# Patient Record
Sex: Female | Born: 1935 | Race: White | Hispanic: No | Smoking: Former smoker
Health system: Southern US, Community
[De-identification: ages and names within clinical notes are randomized; demographics above are authoritative.]

## PROBLEM LIST (undated history)

## (undated) DIAGNOSIS — E119 Type 2 diabetes mellitus without complications: Secondary | ICD-10-CM

## (undated) DIAGNOSIS — D472 Monoclonal gammopathy: Secondary | ICD-10-CM

## (undated) DIAGNOSIS — E559 Vitamin D deficiency, unspecified: Secondary | ICD-10-CM

## (undated) DIAGNOSIS — M549 Dorsalgia, unspecified: Secondary | ICD-10-CM

## (undated) DIAGNOSIS — I10 Essential (primary) hypertension: Secondary | ICD-10-CM

## (undated) DIAGNOSIS — C50919 Malignant neoplasm of unspecified site of unspecified female breast: Secondary | ICD-10-CM

## (undated) DIAGNOSIS — D729 Disorder of white blood cells, unspecified: Secondary | ICD-10-CM

## (undated) DIAGNOSIS — E785 Hyperlipidemia, unspecified: Secondary | ICD-10-CM

## (undated) DIAGNOSIS — M810 Age-related osteoporosis without current pathological fracture: Secondary | ICD-10-CM

## (undated) HISTORY — DX: Essential (primary) hypertension: I10

## (undated) HISTORY — PX: MASTECTOMY: SHX3

## (undated) HISTORY — DX: Malignant neoplasm of unspecified site of unspecified female breast: C50.919

## (undated) HISTORY — PX: BREAST RECONSTRUCTION: SHX9

## (undated) HISTORY — DX: Type 2 diabetes mellitus without complications: E11.9

## (undated) HISTORY — DX: Vitamin D deficiency, unspecified: E55.9

## (undated) HISTORY — PX: TOOTH EXTRACTION: SUR596

## (undated) HISTORY — DX: Age-related osteoporosis without current pathological fracture: M81.0

## (undated) HISTORY — PX: OTHER SURGICAL HISTORY: SHX169

## (undated) HISTORY — DX: Monoclonal gammopathy: D47.2

## (undated) HISTORY — DX: Hyperlipidemia, unspecified: E78.5

## (undated) HISTORY — PX: WISDOM TOOTH EXTRACTION: SHX21

## (undated) HISTORY — PX: BREAST BIOPSY: SHX20

## (undated) HISTORY — DX: Disorder of white blood cells, unspecified: D72.9

---

## 1979-07-21 DIAGNOSIS — C50919 Malignant neoplasm of unspecified site of unspecified female breast: Secondary | ICD-10-CM

## 1979-07-21 HISTORY — DX: Malignant neoplasm of unspecified site of unspecified female breast: C50.919

## 1998-06-26 ENCOUNTER — Other Ambulatory Visit: Admission: RE | Admit: 1998-06-26 | Discharge: 1998-06-26 | Payer: Self-pay | Admitting: Cardiology

## 2000-04-22 ENCOUNTER — Other Ambulatory Visit: Admission: RE | Admit: 2000-04-22 | Discharge: 2000-04-22 | Payer: Self-pay | Admitting: General Surgery

## 2000-04-22 ENCOUNTER — Encounter: Admission: RE | Admit: 2000-04-22 | Discharge: 2000-04-22 | Payer: Self-pay | Admitting: General Surgery

## 2000-04-22 ENCOUNTER — Encounter: Payer: Self-pay | Admitting: General Surgery

## 2000-04-22 ENCOUNTER — Encounter (INDEPENDENT_AMBULATORY_CARE_PROVIDER_SITE_OTHER): Payer: Self-pay | Admitting: *Deleted

## 2002-08-29 ENCOUNTER — Ambulatory Visit (HOSPITAL_COMMUNITY): Admission: RE | Admit: 2002-08-29 | Discharge: 2002-08-29 | Payer: Self-pay | Admitting: Gastroenterology

## 2002-08-29 ENCOUNTER — Encounter (INDEPENDENT_AMBULATORY_CARE_PROVIDER_SITE_OTHER): Payer: Self-pay | Admitting: Specialist

## 2004-06-20 ENCOUNTER — Ambulatory Visit: Payer: Self-pay | Admitting: Hematology & Oncology

## 2004-12-18 ENCOUNTER — Ambulatory Visit: Payer: Self-pay | Admitting: Hematology & Oncology

## 2007-12-15 ENCOUNTER — Ambulatory Visit: Payer: Self-pay | Admitting: Hematology & Oncology

## 2007-12-19 LAB — CBC WITH DIFFERENTIAL/PLATELET
Basophils Absolute: 0 10*3/uL (ref 0.0–0.1)
EOS%: 3.1 % (ref 0.0–7.0)
Eosinophils Absolute: 0.1 10*3/uL (ref 0.0–0.5)
LYMPH%: 11.2 % — ABNORMAL LOW (ref 14.0–48.0)
MCH: 32.4 pg (ref 26.0–34.0)
MCV: 94.9 fL (ref 81.0–101.0)
MONO%: 10.5 % (ref 0.0–13.0)
NEUT#: 1.7 10*3/uL (ref 1.5–6.5)
Platelets: 110 10*3/uL — ABNORMAL LOW (ref 145–400)
RBC: 4.32 10*6/uL (ref 3.70–5.32)

## 2008-06-01 ENCOUNTER — Ambulatory Visit: Payer: Self-pay | Admitting: Hematology & Oncology

## 2008-06-04 LAB — CBC WITH DIFFERENTIAL (CANCER CENTER ONLY)
BASO#: 0 10*3/uL (ref 0.0–0.2)
EOS%: 4.3 % (ref 0.0–7.0)
HCT: 38.3 % (ref 34.8–46.6)
HGB: 13 g/dL (ref 11.6–15.9)
LYMPH#: 0.3 10*3/uL — ABNORMAL LOW (ref 0.9–3.3)
MCHC: 34 g/dL (ref 32.0–36.0)
MONO#: 0.2 10*3/uL (ref 0.1–0.9)
NEUT%: 70.4 % (ref 39.6–80.0)

## 2008-09-21 ENCOUNTER — Ambulatory Visit: Payer: Self-pay | Admitting: Hematology & Oncology

## 2008-09-24 LAB — CBC WITH DIFFERENTIAL (CANCER CENTER ONLY)
BASO%: 0.2 % (ref 0.0–2.0)
HCT: 38.8 % (ref 34.8–46.6)
HGB: 12.9 g/dL (ref 11.6–15.9)
LYMPH#: 0.3 10*3/uL — ABNORMAL LOW (ref 0.9–3.3)
MONO#: 0.2 10*3/uL (ref 0.1–0.9)
NEUT%: 69.6 % (ref 39.6–80.0)
RDW: 11 % (ref 10.5–14.6)
WBC: 1.7 10*3/uL — ABNORMAL LOW (ref 3.9–10.0)

## 2008-09-24 LAB — CHCC SATELLITE - SMEAR

## 2008-11-19 ENCOUNTER — Ambulatory Visit: Payer: Self-pay | Admitting: Hematology & Oncology

## 2009-02-20 ENCOUNTER — Ambulatory Visit: Payer: Self-pay | Admitting: Hematology & Oncology

## 2009-02-21 LAB — CBC WITH DIFFERENTIAL (CANCER CENTER ONLY)
BASO%: 0.4 % (ref 0.0–2.0)
LYMPH#: 0.3 10*3/uL — ABNORMAL LOW (ref 0.9–3.3)
MONO#: 0.2 10*3/uL (ref 0.1–0.9)
NEUT#: 1.8 10*3/uL (ref 1.5–6.5)
Platelets: 97 10*3/uL — ABNORMAL LOW (ref 145–400)
RDW: 11.1 % (ref 10.5–14.6)
WBC: 2.4 10*3/uL — ABNORMAL LOW (ref 3.9–10.0)

## 2009-02-21 LAB — RETICULOCYTES (CHCC)
ABS Retic: 69.9 10*3/uL (ref 19.0–186.0)
RBC.: 2.69 MIL/uL — ABNORMAL LOW (ref 3.87–5.11)
Retic Ct Pct: 2.6 % (ref 0.4–3.1)

## 2009-05-22 ENCOUNTER — Ambulatory Visit: Payer: Self-pay | Admitting: Hematology & Oncology

## 2009-05-23 LAB — CBC WITH DIFFERENTIAL (CANCER CENTER ONLY)
BASO%: 0.4 % (ref 0.0–2.0)
LYMPH%: 13.8 % — ABNORMAL LOW (ref 14.0–48.0)
MCH: 32.7 pg (ref 26.0–34.0)
MCV: 94 fL (ref 81–101)
MONO%: 9.8 % (ref 0.0–13.0)
Platelets: 93 10*3/uL — ABNORMAL LOW (ref 145–400)
RDW: 11.5 % (ref 10.5–14.6)
WBC: 1.7 10*3/uL — ABNORMAL LOW (ref 3.9–10.0)

## 2009-05-23 LAB — RETICULOCYTES (CHCC): RBC.: 3.22 MIL/uL — ABNORMAL LOW (ref 3.87–5.11)

## 2009-08-28 ENCOUNTER — Ambulatory Visit: Payer: Self-pay | Admitting: Hematology & Oncology

## 2009-08-29 LAB — CBC WITH DIFFERENTIAL (CANCER CENTER ONLY)
BASO#: 0 10*3/uL (ref 0.0–0.2)
BASO%: 0.2 % (ref 0.0–2.0)
EOS%: 3.3 % (ref 0.0–7.0)
Eosinophils Absolute: 0.1 10*3/uL (ref 0.0–0.5)
HCT: 38 % (ref 34.8–46.6)
HGB: 13.1 g/dL (ref 11.6–15.9)
LYMPH#: 0.3 10*3/uL — ABNORMAL LOW (ref 0.9–3.3)
LYMPH%: 12.2 % — ABNORMAL LOW (ref 14.0–48.0)
MCH: 31.8 pg (ref 26.0–34.0)
MCHC: 34.4 g/dL (ref 32.0–36.0)
MCV: 93 fL (ref 81–101)
MONO#: 0.2 10*3/uL (ref 0.1–0.9)
MONO%: 8.9 % (ref 0.0–13.0)
NEUT#: 1.5 10*3/uL (ref 1.5–6.5)
NEUT%: 75.4 % (ref 39.6–80.0)
Platelets: 116 10*3/uL — ABNORMAL LOW (ref 145–400)
RBC: 4.1 10*6/uL (ref 3.70–5.32)
RDW: 11.7 % (ref 10.5–14.6)
WBC: 2 10*3/uL — ABNORMAL LOW (ref 3.9–10.0)

## 2009-08-29 LAB — CHCC SATELLITE - SMEAR

## 2009-08-30 LAB — VITAMIN B12: Vitamin B-12: 373 pg/mL (ref 211–911)

## 2009-08-30 LAB — FERRITIN: Ferritin: 58 ng/mL (ref 10–291)

## 2009-08-30 LAB — ERYTHROPOIETIN: Erythropoietin: 18.8 m[IU]/mL (ref 2.6–34.0)

## 2009-10-25 ENCOUNTER — Encounter: Payer: Self-pay | Admitting: Cardiology

## 2010-01-22 ENCOUNTER — Ambulatory Visit: Payer: Self-pay | Admitting: Hematology & Oncology

## 2010-01-23 LAB — CBC WITH DIFFERENTIAL (CANCER CENTER ONLY)
BASO#: 0 10*3/uL (ref 0.0–0.2)
BASO%: 0.2 % (ref 0.0–2.0)
EOS%: 4.8 % (ref 0.0–7.0)
Eosinophils Absolute: 0.1 10*3/uL (ref 0.0–0.5)
HCT: 39 % (ref 34.8–46.6)
HGB: 13.1 g/dL (ref 11.6–15.9)
LYMPH#: 0.3 10*3/uL — ABNORMAL LOW (ref 0.9–3.3)
LYMPH%: 13.6 % — ABNORMAL LOW (ref 14.0–48.0)
MCH: 32.5 pg (ref 26.0–34.0)
MCHC: 33.5 g/dL (ref 32.0–36.0)
MCV: 97 fL (ref 81–101)
MONO#: 0.2 10*3/uL (ref 0.1–0.9)
MONO%: 7.7 % (ref 0.0–13.0)
NEUT#: 1.8 10*3/uL (ref 1.5–6.5)
NEUT%: 73.7 % (ref 39.6–80.0)
Platelets: 121 10*3/uL — ABNORMAL LOW (ref 145–400)
RBC: 4.01 10*6/uL (ref 3.70–5.32)
RDW: 11.7 % (ref 10.5–14.6)
WBC: 2.5 10*3/uL — ABNORMAL LOW (ref 3.9–10.0)

## 2010-01-23 LAB — RETICULOCYTES (CHCC)
ABS Retic: 70.7 10*3/uL (ref 19.0–186.0)
RBC.: 3.93 MIL/uL (ref 3.87–5.11)
Retic Ct Pct: 1.8 % (ref 0.4–3.1)

## 2010-01-23 LAB — CHCC SATELLITE - SMEAR

## 2010-07-01 ENCOUNTER — Ambulatory Visit: Payer: Self-pay | Admitting: Hematology & Oncology

## 2010-07-03 LAB — CBC WITH DIFFERENTIAL (CANCER CENTER ONLY)
EOS%: 1.5 % (ref 0.0–7.0)
Eosinophils Absolute: 0 10*3/uL (ref 0.0–0.5)
MCH: 31.7 pg (ref 26.0–34.0)
MCHC: 33.4 g/dL (ref 32.0–36.0)
MONO%: 14.2 % — ABNORMAL HIGH (ref 0.0–13.0)
NEUT#: 1 10*3/uL — ABNORMAL LOW (ref 1.5–6.5)
Platelets: 92 10*3/uL — ABNORMAL LOW (ref 145–400)
RBC: 3.68 10*6/uL — ABNORMAL LOW (ref 3.70–5.32)

## 2010-07-03 LAB — CHCC SATELLITE - SMEAR

## 2010-09-12 ENCOUNTER — Inpatient Hospital Stay (HOSPITAL_COMMUNITY)
Admission: EM | Admit: 2010-09-12 | Discharge: 2010-09-13 | DRG: 087 | Disposition: A | Discharge status: Home / Self Care | Payer: Medicare Other | Source: Other Acute Inpatient Hospital | Attending: Neurological Surgery | Admitting: Neurological Surgery

## 2010-09-12 ENCOUNTER — Emergency Department (HOSPITAL_BASED_OUTPATIENT_CLINIC_OR_DEPARTMENT_OTHER)
Admission: EM | Admit: 2010-09-12 | Discharge: 2010-09-12 | Disposition: A | Discharge status: Other Acute Inpatient Hospital | Payer: Medicare Other | Source: Home / Self Care

## 2010-09-12 ENCOUNTER — Emergency Department (INDEPENDENT_AMBULATORY_CARE_PROVIDER_SITE_OTHER): Payer: Medicare Other

## 2010-09-12 DIAGNOSIS — F29 Unspecified psychosis not due to a substance or known physiological condition: Secondary | ICD-10-CM | POA: Insufficient documentation

## 2010-09-12 DIAGNOSIS — Z853 Personal history of malignant neoplasm of breast: Secondary | ICD-10-CM | POA: Insufficient documentation

## 2010-09-12 DIAGNOSIS — S0100XA Unspecified open wound of scalp, initial encounter: Secondary | ICD-10-CM | POA: Insufficient documentation

## 2010-09-12 DIAGNOSIS — S066XAA Traumatic subarachnoid hemorrhage with loss of consciousness status unknown, initial encounter: Principal | ICD-10-CM | POA: Diagnosis present

## 2010-09-12 DIAGNOSIS — S0003XA Contusion of scalp, initial encounter: Secondary | ICD-10-CM

## 2010-09-12 DIAGNOSIS — F329 Major depressive disorder, single episode, unspecified: Secondary | ICD-10-CM | POA: Insufficient documentation

## 2010-09-12 DIAGNOSIS — Z043 Encounter for examination and observation following other accident: Secondary | ICD-10-CM

## 2010-09-12 DIAGNOSIS — W19XXXA Unspecified fall, initial encounter: Secondary | ICD-10-CM

## 2010-09-12 DIAGNOSIS — M542 Cervicalgia: Secondary | ICD-10-CM | POA: Insufficient documentation

## 2010-09-12 DIAGNOSIS — I609 Nontraumatic subarachnoid hemorrhage, unspecified: Secondary | ICD-10-CM

## 2010-09-12 DIAGNOSIS — Y92009 Unspecified place in unspecified non-institutional (private) residence as the place of occurrence of the external cause: Secondary | ICD-10-CM | POA: Insufficient documentation

## 2010-09-12 DIAGNOSIS — R4701 Aphasia: Secondary | ICD-10-CM

## 2010-09-12 DIAGNOSIS — S066X9A Traumatic subarachnoid hemorrhage with loss of consciousness of unspecified duration, initial encounter: Principal | ICD-10-CM | POA: Diagnosis present

## 2010-09-12 DIAGNOSIS — S1093XA Contusion of unspecified part of neck, initial encounter: Secondary | ICD-10-CM

## 2010-09-12 DIAGNOSIS — W010XXA Fall on same level from slipping, tripping and stumbling without subsequent striking against object, initial encounter: Secondary | ICD-10-CM | POA: Insufficient documentation

## 2010-09-12 DIAGNOSIS — R4182 Altered mental status, unspecified: Secondary | ICD-10-CM | POA: Insufficient documentation

## 2010-09-12 DIAGNOSIS — I10 Essential (primary) hypertension: Secondary | ICD-10-CM | POA: Insufficient documentation

## 2010-09-12 DIAGNOSIS — F3289 Other specified depressive episodes: Secondary | ICD-10-CM | POA: Insufficient documentation

## 2010-09-12 DIAGNOSIS — S066X0A Traumatic subarachnoid hemorrhage without loss of consciousness, initial encounter: Secondary | ICD-10-CM | POA: Insufficient documentation

## 2010-09-12 DIAGNOSIS — Z79899 Other long term (current) drug therapy: Secondary | ICD-10-CM | POA: Insufficient documentation

## 2010-09-12 LAB — BASIC METABOLIC PANEL
BUN: 12 mg/dL (ref 6–23)
Chloride: 99 mEq/L (ref 96–112)
Creatinine, Ser: 0.9 mg/dL (ref 0.4–1.2)
GFR calc Af Amer: >60 mL/min (ref >60)
GFR calc non Af Amer: >60 mL/min (ref >60)
Potassium: 4 mEq/L (ref 3.5–5.1)

## 2010-09-12 LAB — CBC
MCH: 31.4 pg (ref 26.0–34.0)
MCV: 92 fL (ref 78.0–100.0)
Platelets: 103 10*3/uL — ABNORMAL LOW (ref 150–400)
RBC: 3.73 MIL/uL — ABNORMAL LOW (ref 3.87–5.11)
RDW: 13.3 % (ref 11.5–15.5)

## 2010-09-12 LAB — ETHANOL: Alcohol, Ethyl (B): <10 mg/dL (ref 0–10)

## 2010-09-12 LAB — DIFFERENTIAL
Basophils Relative: 0 % (ref 0–1)
Eosinophils Absolute: 0.1 10*3/uL (ref 0.0–0.7)
Eosinophils Relative: 1 % (ref 0–5)
Lymphs Abs: 0.1 10*3/uL — ABNORMAL LOW (ref 0.7–4.0)
Monocytes Relative: 6 % (ref 3–12)
Neutrophils Relative %: 91 % — ABNORMAL HIGH (ref 43–77)

## 2010-09-12 LAB — PROTIME-INR
INR: 1.06 (ref 0.00–1.49)
Prothrombin Time: 14 seconds (ref 11.6–15.2)

## 2010-09-13 NOTE — H&P (Signed)
  NAME:  [name] [name], PUA NO.:  [id number]  MEDICAL RECORD NO.:  [id number]           PATIENT TYPE:  I  LOCATION:  3015                         FACILITY:  [facility]  PHYSICIAN:  Tia Alert, MD     DATE OF BIRTH:  10/27/1934  DATE OF ADMISSION:  09/12/2010 DATE OF DISCHARGE:                             HISTORY & PHYSICAL   ADMITTING DIAGNOSIS:  Closed head injury.  HISTORY OF PRESENT ILLNESS:  [name] [name] is a 76 year old female who states she fell earlier this evening.  She has amnesia for the event. She denies any significant headache, visual changes, numbness, tingling or weakness.  She does have a significant scalp laceration, which they put some attempted staples and at the other hospital and then wrapped her head and send her here.  Head CT showed some very small amount of traumatic subarachnoid hemorrhage in the high left parietal region and she was sent for neurosurgical consultation.  PAST MEDICAL HISTORY: 1. Breast cancer. 2. Depression. 3. Hypertension.  MEDICATIONS: 1. Crestor. 2. Propranolol. 3. Evista. 4. Spironolactone. 5. Lexapro.  ALLERGIES:  NO KNOWN DRUG ALLERGIES.  SOCIAL HISTORY:  Nondrinker, nonsmoker.  PHYSICAL EXAM:  VITAL SIGNS:  Temperature 98.4, blood pressure 144/69, pulse 75, respirations 16. GENERAL:  Pleasant cooperative female in no acute distress. HEENT: She has a significant 4-cm scalp laceration on the left, which goes to the calvarium with significant hematoma.  This is going to have to be repaired in the operating room.  Extraocular movements intact. Pupils are equal and reactive. NECK:  Supple and nontender. HEART:  Regular rhythm. EXTREMITIES:  No obvious deformities. NEUROLOGIC:  She is awake and alert.  She is interactive.  She has no aphasia.  She has good attention span.  She is conversive.  No facial asymmetry.  Tongue protrudes in midline.  She has very good strength throughout with good muscle tone and  good muscle bulk.  Gait is not tested.  IMAGING STUDIES:  CT scan of the head shows small amount of traumatic subarachnoid hemorrhage in the high left parietal region.  No shift, mass effect, or edema.  ASSESSMENT AND PLAN:  She has a small amount of traumatic subarachnoid hemorrhage and a closed head injury with amnesia for the event.  She has a traumatic scalp laceration, which is going to be have to be repaired in the operating room under sedation.  There seems to be significant loss of tissue, especially the underlying fascia, I will try to bring this together and close, it is best I can in the operating room.  All of this has been explained to her.  I think she will heal very nicely from her head injury.  The scalp laceration hopefully will heal nicely, but I am worried about the skin edges, likely the scalp is very vascular and tends to heal fairly well.     Tia Alert, MD     DSJ/MEDQ  D:  09/12/2010  T:  09/13/2010  Job:  [id number]  Electronically Signed by Marikay Alar MD on 09/13/2010 08:11:47 AM

## 2010-09-13 NOTE — Op Note (Signed)
  NAME:  [name] [name], [name] [name] NO.:  [id number]  MEDICAL RECORD NO.:  [id number]           PATIENT TYPE:  I  LOCATION:  3015                         FACILITY:  [facility]  PHYSICIAN:  Tia Alert, MD     DATE OF BIRTH:  1935/07/06  DATE OF PROCEDURE:  09/12/2010 DATE OF DISCHARGE:                              OPERATIVE REPORT   PREPROCEDURE DIAGNOSIS:  Complex left parietal scalp laceration.  PROCEDURE:  Repair of complex left parietal scalp laceration measuring 6 cm in length.  DESCRIPTION OF PROCEDURE:  The patient was taken to the operating room and given sedation.  Her left parietal region was shaved around the laceration, prepped with DuraPrep, and then draped in usual sterile fashion.  I cleaned out the wound, removed the hematoma, dried any bleeding I could find, and then placed several 2-0 Vicryls in lacerated galea.  Tried to bring the edges as close together as I could and then closed the skin with a running 3-0 Ethilon suture.  Sterile dressing was then applied, and the patient was taken back to her room in stable condition.  At the end of the procedure, all sponge, needle, and instrument counts were correct.     Tia Alert, MD     DSJ/MEDQ  D:  09/12/2010  T:  09/13/2010  Job:  [id number]  Electronically Signed by Marikay Alar MD on 09/13/2010 08:11:44 AM

## 2010-10-10 NOTE — Discharge Summary (Signed)
  NAME:  CALAIS, SVEHLA NO.:  [id number]  MEDICAL RECORD NO.:  [id number]           PATIENT TYPE:  LOCATION:                                 FACILITY:  PHYSICIAN:  Tia Alert, MD     DATE OF BIRTH:  1934-12-29  DATE OF ADMISSION: DATE OF DISCHARGE:                              DISCHARGE SUMMARY   ADDENDUM  This is an addendum to a handwritten discharge note.  The patient's final diagnosis should be closed head injury with scalp laceration.     Tia Alert, MD     DSJ/MEDQ  D:  09/26/2010  T:  09/26/2010  Job:  [id number]  Electronically Signed by Marikay Alar MD on 10/10/2010 11:56:35 AM

## 2010-10-16 ENCOUNTER — Other Ambulatory Visit: Payer: Self-pay | Admitting: Hematology & Oncology

## 2010-10-16 ENCOUNTER — Encounter (HOSPITAL_BASED_OUTPATIENT_CLINIC_OR_DEPARTMENT_OTHER): Payer: Medicare Other | Admitting: Hematology & Oncology

## 2010-10-16 DIAGNOSIS — D72819 Decreased white blood cell count, unspecified: Secondary | ICD-10-CM

## 2010-10-16 DIAGNOSIS — D696 Thrombocytopenia, unspecified: Secondary | ICD-10-CM

## 2010-10-16 LAB — RETICULOCYTES (CHCC)
ABS Retic: 62.2 10*3/uL (ref 19.0–186.0)
RBC.: 3.89 MIL/uL (ref 3.87–5.11)
Retic Ct Pct: 1.6 % (ref 0.4–3.1)

## 2010-10-16 LAB — CBC WITH DIFFERENTIAL (CANCER CENTER ONLY)
BASO#: 0 10*3/uL (ref 0.0–0.2)
BASO%: 1.6 % (ref 0.0–2.0)
EOS%: 3.1 % (ref 0.0–7.0)
LYMPH#: 0.2 10*3/uL — ABNORMAL LOW (ref 0.9–3.3)
MCH: 30.5 pg (ref 26.0–34.0)
MCHC: 32.9 g/dL (ref 32.0–36.0)
MONO%: 14.6 % — ABNORMAL HIGH (ref 0.0–13.0)
NEUT#: 1.3 10*3/uL — ABNORMAL LOW (ref 1.5–6.5)
Platelets: 94 10*3/uL — ABNORMAL LOW (ref 145–400)
RDW: 13.2 % (ref 11.1–15.7)

## 2010-10-16 LAB — CHCC SATELLITE - SMEAR

## 2010-12-05 NOTE — Op Note (Signed)
   NAME:  [name] [name], [name] [name]                             ACCOUNT NO.:  [id number]   MEDICAL RECORD NO.:  [id number]                   PATIENT TYPE:  AMB   LOCATION:  ENDO                                 FACILITY:  [facility]   PHYSICIAN:  Petra Kuba, M.D.                 DATE OF BIRTH:  06/15/1935   DATE OF PROCEDURE:  08/15/2002  DATE OF DISCHARGE:                                 OPERATIVE REPORT   PROCEDURE:  Colonoscopy with polypectomy.   INDICATIONS FOR PROCEDURE:  Screening.   Consent was signed after risks, benefits, methods, and options were  thoroughly discussed in the office.   MEDICINES USED:  Demerol 80, Versed 7.   DESCRIPTION OF PROCEDURE:  Rectal inspection was pertinent for external  hemorrhoids, small. Digital exam was negative. The pediatric video  adjustable colonoscope was inserted, easily advanced around the colon to the  cecum. This did require some abdominal pressure but no position changes. No  obvious abnormality was seen on insertion. The cecum was identified by the  appendiceal orifice and the ileocecal valve.  The prep was adequate. There  was some liquid stool that required washing and suctioning. On slow  withdrawal through the colon, other than a tiny proximal descending polyp  which was hot biopsied x1, no other abnormalities were seen as we slowly  withdrew back to the rectum. Once back in the rectum, the scope was  retroflexed pertinent for some internal hemorrhoids. The scope was  straightened, air was suctioned, scope removed. The patient tolerated the  procedure well. There was no obvious or immediate complications.   ENDOSCOPIC DIAGNOSIS:  1. Internal and external hemorrhoids.  2. Proximal descending polyp hot biopsied.  3. Otherwise within normal limits to the cecum.   PLAN:  Await pathology but probably recheck in five years. Happy to see back  p.r.n., otherwise, return care to Dr. Waynard Edwards for the customary health care  maintenance to include  yearly rectals and guaiacs.                                               Petra Kuba, M.D.    MEM/MEDQ  D:  08/29/2002  T:  08/29/2002  Job:  [id number]   cc:   Loraine Leriche A. Waynard Edwards, M.D.  [address]  [place]  [place] [address]  Fax: [phone]

## 2011-04-01 ENCOUNTER — Encounter (HOSPITAL_BASED_OUTPATIENT_CLINIC_OR_DEPARTMENT_OTHER): Payer: Medicare Other | Admitting: Hematology & Oncology

## 2011-04-01 ENCOUNTER — Other Ambulatory Visit: Payer: Self-pay | Admitting: Hematology & Oncology

## 2011-04-01 DIAGNOSIS — Z23 Encounter for immunization: Secondary | ICD-10-CM

## 2011-04-01 DIAGNOSIS — D696 Thrombocytopenia, unspecified: Secondary | ICD-10-CM

## 2011-04-01 DIAGNOSIS — D72819 Decreased white blood cell count, unspecified: Secondary | ICD-10-CM

## 2011-04-01 LAB — CBC WITH DIFFERENTIAL (CANCER CENTER ONLY)
BASO#: 0 10*3/uL (ref 0.0–0.2)
EOS%: 2.5 % (ref 0.0–7.0)
HCT: 36.5 % (ref 34.8–46.6)
HGB: 12.4 g/dL (ref 11.6–15.9)
LYMPH%: 5.8 % — ABNORMAL LOW (ref 14.0–48.0)
MCH: 32.4 pg (ref 26.0–34.0)
MCHC: 34 g/dL (ref 32.0–36.0)
MCV: 95 fL (ref 81–101)
MONO%: 14.1 % — ABNORMAL HIGH (ref 0.0–13.0)
NEUT%: 77.2 % (ref 39.6–80.0)

## 2011-09-30 ENCOUNTER — Ambulatory Visit (HOSPITAL_BASED_OUTPATIENT_CLINIC_OR_DEPARTMENT_OTHER): Payer: Medicare Other | Admitting: Hematology & Oncology

## 2011-09-30 ENCOUNTER — Other Ambulatory Visit (HOSPITAL_BASED_OUTPATIENT_CLINIC_OR_DEPARTMENT_OTHER): Payer: Medicare Other | Admitting: Lab

## 2011-09-30 VITALS — BP 134/69 | HR 62 | Temp 97.5°F | Wt 170.0 lb

## 2011-09-30 DIAGNOSIS — D696 Thrombocytopenia, unspecified: Secondary | ICD-10-CM

## 2011-09-30 DIAGNOSIS — D72819 Decreased white blood cell count, unspecified: Secondary | ICD-10-CM

## 2011-09-30 LAB — CBC WITH DIFFERENTIAL (CANCER CENTER ONLY)
BASO#: 0 10*3/uL (ref 0.0–0.2)
Eosinophils Absolute: 0.2 10*3/uL (ref 0.0–0.5)
HGB: 12.6 g/dL (ref 11.6–15.9)
LYMPH%: 7.4 % — ABNORMAL LOW (ref 14.0–48.0)
MCH: 32.1 pg (ref 26.0–34.0)
MCV: 96 fL (ref 81–101)
MONO#: 0.4 10*3/uL (ref 0.1–0.9)
MONO%: 13.1 % — ABNORMAL HIGH (ref 0.0–13.0)
RBC: 3.93 10*6/uL (ref 3.70–5.32)

## 2011-09-30 NOTE — Progress Notes (Signed)
Diagnosis: Chronic leukopenia and thrombocytopenia.  Current therapy: Observation  Interim history: [name] [name] comes in for followup. We last saw back in September of 2012. Since then, she done well. She's had chronic leukopenia and thrombocytopenia. I will follow her now for several years. She's been totally asymptomatic.  Extent of changes in her medications. She's had no infections. There's been no rashes. Genitalia bowel or bladder habits.  Her mammogram was and is due this month.  On physical exam this is a well-developed well-nourished white female in no obvious distress. Vital signs 97 5 pulse 62 heart rate 18 blood pressure 134/69. Weight is 170 pounds.  Her head and neck exam shows no ocular or oral lesions. There is no adenopathy in her neck. Thyroid is nonpalpable. Lungs are clear bilaterally. Cardiac exam regular rhythm with no murmurs rubs or bruits. The bowel sounds soft with good bowel sounds. There is no palpable liver or spleen tip. His back exam no tenderness over the spine ribs or hips. Extremities shows no clubbing cyanosis or edema. Skin exam no rashes ecchymoses or petechia.  Laboratory studies WBC count is 2.8 hemoglobin 12.6 hematocrit 37.8 platelet count 99.  Peripheral smear is unremarkable. She has good maturation of her  red cells and white cells she has no immature myeloid cells. I see no hypersegmented polys. There is no nucleated red blood cells. Platelets are decreased in number. She has a few large platelets. Platelets are well granulated.   Impression: [name] [name] is a 77 year old white female with chronic leukopenia and no cytopenia. I haven't seen her since 2005. Her platelet count has trended down slightly but she has a symptomatic.  I do not see need for intervention. I really think we can see her back yearly now. She doesn't give [place] a call if there are any issues.

## 2011-12-19 ENCOUNTER — Encounter: Payer: Self-pay | Admitting: *Deleted

## 2011-12-19 ENCOUNTER — Other Ambulatory Visit: Payer: Self-pay | Admitting: *Deleted

## 2012-02-11 ENCOUNTER — Other Ambulatory Visit: Payer: Self-pay

## 2012-02-19 ENCOUNTER — Ambulatory Visit (HOSPITAL_BASED_OUTPATIENT_CLINIC_OR_DEPARTMENT_OTHER): Payer: Medicare Other | Admitting: Hematology & Oncology

## 2012-02-19 ENCOUNTER — Other Ambulatory Visit (HOSPITAL_BASED_OUTPATIENT_CLINIC_OR_DEPARTMENT_OTHER): Payer: Medicare Other | Admitting: Lab

## 2012-02-19 VITALS — BP 134/61 | HR 58 | Temp 99.0°F | Resp 18 | Ht 68.5 in | Wt 167.0 lb

## 2012-02-19 DIAGNOSIS — D708 Other neutropenia: Secondary | ICD-10-CM

## 2012-02-19 DIAGNOSIS — D72819 Decreased white blood cell count, unspecified: Secondary | ICD-10-CM

## 2012-02-19 DIAGNOSIS — D696 Thrombocytopenia, unspecified: Secondary | ICD-10-CM

## 2012-02-19 DIAGNOSIS — D472 Monoclonal gammopathy: Secondary | ICD-10-CM

## 2012-02-19 LAB — CBC WITH DIFFERENTIAL (CANCER CENTER ONLY)
BASO%: 0.4 % (ref 0.0–2.0)
EOS%: 2.2 % (ref 0.0–7.0)
HCT: 36.4 % (ref 34.8–46.6)
LYMPH%: 9 % — ABNORMAL LOW (ref 14.0–48.0)
MCHC: 33.5 g/dL (ref 32.0–36.0)
MCV: 97 fL (ref 81–101)
MONO#: 0.4 10*3/uL (ref 0.1–0.9)
NEUT%: 72.7 % (ref 39.6–80.0)
RDW: 13.6 % (ref 11.1–15.7)

## 2012-02-19 NOTE — Progress Notes (Signed)
This office note has been dictated.
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
## 2012-06-23 ENCOUNTER — Ambulatory Visit (HOSPITAL_BASED_OUTPATIENT_CLINIC_OR_DEPARTMENT_OTHER)
Admission: RE | Admit: 2012-06-23 | Discharge: 2012-06-23 | Disposition: A | Discharge status: Home / Self Care | Payer: Medicare Other | Source: Ambulatory Visit | Attending: Hematology & Oncology | Admitting: Hematology & Oncology

## 2012-06-23 ENCOUNTER — Ambulatory Visit (HOSPITAL_BASED_OUTPATIENT_CLINIC_OR_DEPARTMENT_OTHER): Payer: Medicare Other | Admitting: Hematology & Oncology

## 2012-06-23 ENCOUNTER — Other Ambulatory Visit (HOSPITAL_BASED_OUTPATIENT_CLINIC_OR_DEPARTMENT_OTHER): Payer: Medicare Other | Admitting: Lab

## 2012-06-23 VITALS — BP 123/50 | HR 61 | Temp 98.0°F | Resp 16 | Ht 68.0 in | Wt 172.0 lb

## 2012-06-23 DIAGNOSIS — D61818 Other pancytopenia: Secondary | ICD-10-CM

## 2012-06-23 DIAGNOSIS — D72819 Decreased white blood cell count, unspecified: Secondary | ICD-10-CM | POA: Insufficient documentation

## 2012-06-23 DIAGNOSIS — D472 Monoclonal gammopathy: Secondary | ICD-10-CM

## 2012-06-23 DIAGNOSIS — Z9010 Acquired absence of unspecified breast and nipple: Secondary | ICD-10-CM | POA: Insufficient documentation

## 2012-06-23 DIAGNOSIS — R05 Cough: Secondary | ICD-10-CM

## 2012-06-23 DIAGNOSIS — I10 Essential (primary) hypertension: Secondary | ICD-10-CM | POA: Insufficient documentation

## 2012-06-23 DIAGNOSIS — R059 Cough, unspecified: Secondary | ICD-10-CM | POA: Insufficient documentation

## 2012-06-23 DIAGNOSIS — I2789 Other specified pulmonary heart diseases: Secondary | ICD-10-CM | POA: Insufficient documentation

## 2012-06-23 LAB — CBC WITH DIFFERENTIAL (CANCER CENTER ONLY)
BASO#: 0 10*3/uL (ref 0.0–0.2)
EOS%: 5.1 % (ref 0.0–7.0)
Eosinophils Absolute: 0.1 10*3/uL (ref 0.0–0.5)
HCT: 36.1 % (ref 34.8–46.6)
HGB: 11.7 g/dL (ref 11.6–15.9)
LYMPH#: 0.2 10*3/uL — ABNORMAL LOW (ref 0.9–3.3)
MCHC: 32.4 g/dL (ref 32.0–36.0)
NEUT%: 73.9 % (ref 39.6–80.0)
RBC: 3.63 10*6/uL — ABNORMAL LOW (ref 3.70–5.32)

## 2012-06-23 NOTE — Progress Notes (Signed)
This office note has been dictated.

## 2012-06-24 NOTE — Progress Notes (Signed)
CC:   Mark A. Perini, M.D.  DIAGNOSIS: 1. Leukopenia/thrombocytopenia, progressive. 2. IgM kappa monoclonal gammopathy of undetermined significance     (MGUS).  CURRENT THERAPY:  Observation.  INTERIM HISTORY:  Ms. Windholz comes in for followup.  She is doing fairly well.  She has had no complaints since we saw her back in August.  She has had no fevers, sweats, or chills.  She has had no rashes.  There has been no nausea or vomiting.  She has had a dry cough.  I probably need to do a chest x-ray on her.  I think her last chest x-ray that I have in the system was almost 2 years ago.  She has had no change in her weight.  There have been no palpable lymph glands.  PHYSICAL EXAMINATION:  General:  This is a well-developed, well- nourished white female, in no obvious distress.  Vital signs: Temperature 98.6, pulse 61, respiratory rate 16, blood pressure 123/50. Weight is 172.  Head and neck:  Normocephalic, atraumatic skull.  There are no ocular or oral lesions.  There are no palpable cervical or supraclavicular lymph nodes.  Lungs:  Clear bilaterally.  Cardiac: Regular rate and rhythm, with a normal S1, S2.  There are no murmurs, rubs or bruits.  Abdomen:  Soft, with good bowel sounds.  There is no fluid wave.  There is no palpable abdominal mass.  No palpable hepatosplenomegaly.  Extremities:  Show no clubbing, cyanosis or edema. Skin:  No rashes, ecchymosis or petechia.  LABORATORY STUDIES:  White cell count is 1.8, hemoglobin 11.7, hematocrit 36.1, platelet count 72,000.  MCV is 99.  Peripheral smear shows a normochromic, normocytic population of red blood cells.  There are no nucleated red blood cells.  There may be some spherocytes.  I see no schistocytes.  There is no obvious rouleaux formation.  White cells appear decreased in number.  She has good maturation of her neutrophils.  There may be a couple of atypical lymphocytes.  No blasts are noted.  Platelets are decreased  in number. She has a few large platelets.  IMPRESSION:  Ms. Hannis is a 77 year old white female with progressive pancytopenia.  I suspect that there is something going on with her bone marrow now.  I think we are going to have to do a bone marrow test on her.  I talked to her about this.  I explained to her why I think we need to do one.  We will get one set up for 07/05/2012.  We will go ahead and plan to get her back in about 3 weeks.  By then we will have the results from the bone marrow test, including cytogenetics and flow cytometry, which will be critical.  This IgM kappa MGUS might be a clue as to what is going on.    ______________________________ Josph Macho, M.D. PRE/MEDQ  D:  06/23/2012  T:  06/24/2012  Job:  [id number]

## 2012-06-27 LAB — PROTEIN ELECTROPHORESIS, SERUM, WITH REFLEX
Alpha-1-Globulin: 4.9 % (ref 2.9–4.9)
Beta 2: 3.2 % (ref 3.2–6.5)
Gamma Globulin: 17.6 % (ref 11.1–18.8)
M-Spike, %: 0.63 g/dL

## 2012-06-27 LAB — KAPPA/LAMBDA LIGHT CHAINS
Kappa:Lambda Ratio: 37.34 — ABNORMAL HIGH (ref 0.26–1.65)
Lambda Free Lght Chn: 1.43 mg/dL (ref 0.57–2.63)

## 2012-06-27 LAB — IGG, IGA, IGM
IgA: 103 mg/dL (ref 69–380)
IgG (Immunoglobin G), Serum: 574 mg/dL — ABNORMAL LOW (ref 690–1700)
IgM, Serum: 893 mg/dL — ABNORMAL HIGH (ref 52–322)

## 2012-06-27 LAB — LACTATE DEHYDROGENASE: LDH: 140 U/L (ref 94–250)

## 2012-06-30 ENCOUNTER — Encounter (HOSPITAL_COMMUNITY): Payer: Self-pay | Admitting: Pharmacy Technician

## 2012-07-04 ENCOUNTER — Other Ambulatory Visit: Payer: Self-pay | Admitting: *Deleted

## 2012-07-04 DIAGNOSIS — D61818 Other pancytopenia: Secondary | ICD-10-CM

## 2012-07-05 ENCOUNTER — Ambulatory Visit (HOSPITAL_COMMUNITY)
Admission: RE | Admit: 2012-07-05 | Discharge: 2012-07-05 | Disposition: A | Discharge status: Home / Self Care | Payer: Medicare Other | Source: Ambulatory Visit | Attending: Hematology & Oncology | Admitting: Hematology & Oncology

## 2012-07-05 ENCOUNTER — Ambulatory Visit (HOSPITAL_BASED_OUTPATIENT_CLINIC_OR_DEPARTMENT_OTHER): Payer: Medicare Other | Admitting: Hematology & Oncology

## 2012-07-05 ENCOUNTER — Encounter (HOSPITAL_COMMUNITY): Payer: Self-pay

## 2012-07-05 VITALS — BP 101/77 | HR 58 | Temp 98.1°F | Resp 16

## 2012-07-05 DIAGNOSIS — D61818 Other pancytopenia: Secondary | ICD-10-CM

## 2012-07-05 DIAGNOSIS — D696 Thrombocytopenia, unspecified: Secondary | ICD-10-CM | POA: Insufficient documentation

## 2012-07-05 DIAGNOSIS — D472 Monoclonal gammopathy: Secondary | ICD-10-CM | POA: Insufficient documentation

## 2012-07-05 DIAGNOSIS — D72819 Decreased white blood cell count, unspecified: Secondary | ICD-10-CM | POA: Insufficient documentation

## 2012-07-05 LAB — CBC WITH DIFFERENTIAL/PLATELET
Basophils Relative: 1 % (ref 0–1)
Eosinophils Absolute: 0.2 10*3/uL (ref 0.0–0.7)
Eosinophils Relative: 9 % — ABNORMAL HIGH (ref 0–5)
MCH: 32.5 pg (ref 26.0–34.0)
MCHC: 34.1 g/dL (ref 30.0–36.0)
Neutrophils Relative %: 68 % (ref 43–77)
Platelets: 100 10*3/uL — ABNORMAL LOW (ref 150–400)

## 2012-07-05 MED ORDER — MIDAZOLAM HCL 2 MG/2ML IJ SOLN
INTRAMUSCULAR | Status: AC | PRN
  Administered 2012-07-05 (×2): 1 mg via INTRAVENOUS

## 2012-07-05 MED ORDER — MIDAZOLAM HCL 10 MG/2ML IJ SOLN
INTRAMUSCULAR | Status: AC
  Filled 2012-07-05: qty 2

## 2012-07-05 MED ORDER — MEPERIDINE HCL 50 MG/ML IJ SOLN
INTRAMUSCULAR | Status: AC
Start: 2012-07-05 — End: 2012-07-05
  Filled 2012-07-05: qty 1

## 2012-07-05 MED ORDER — MEPERIDINE HCL 25 MG/ML IJ SOLN
INTRAMUSCULAR | Status: AC | PRN
  Administered 2012-07-05 (×2): 12.5 mg via INTRAVENOUS

## 2012-07-05 MED ORDER — SODIUM CHLORIDE 0.9 % IV SOLN
Freq: Once | INTRAVENOUS | Status: AC
  Administered 2012-07-05: 08:00:00 via INTRAVENOUS

## 2012-07-05 NOTE — ED Notes (Signed)
No bleeding at site

## 2012-07-05 NOTE — ED Notes (Signed)
Pt warm and dry color pink ,just drowsy and easily awakens when spoken to. Dressing CDI

## 2012-07-05 NOTE — ED Notes (Signed)
Ambulated in room and tolerated this well. Dressing CDI

## 2012-07-05 NOTE — Sedation Documentation (Signed)
Medication dose calculated and verified WUJ:[id number] 25 mg IV,Versed 2 mg IV

## 2012-07-05 NOTE — ED Notes (Signed)
Family updated as to patient's status.

## 2012-07-05 NOTE — ED Notes (Signed)
Patient is resting comfortably. 

## 2012-07-05 NOTE — ED Notes (Signed)
Patient denies pain and is resting comfortably.  

## 2012-07-05 NOTE — ED Notes (Signed)
Procedure ends and dressing to posterior iliac crest area with hypafix and gauze.pt placed supine with towel to site for pressure

## 2012-07-08 ENCOUNTER — Telehealth: Payer: Self-pay | Admitting: Hematology & Oncology

## 2012-07-08 NOTE — Telephone Encounter (Signed)
Per MD request pt aware moved 12-31 to 08-16-12

## 2012-07-19 ENCOUNTER — Other Ambulatory Visit: Payer: Medicare Other | Admitting: Lab

## 2012-07-19 ENCOUNTER — Ambulatory Visit: Payer: Medicare Other | Admitting: Hematology & Oncology

## 2012-07-22 ENCOUNTER — Encounter: Payer: Self-pay | Admitting: Hematology & Oncology

## 2012-08-16 ENCOUNTER — Ambulatory Visit (HOSPITAL_BASED_OUTPATIENT_CLINIC_OR_DEPARTMENT_OTHER): Payer: Medicare Other

## 2012-08-16 ENCOUNTER — Other Ambulatory Visit (HOSPITAL_BASED_OUTPATIENT_CLINIC_OR_DEPARTMENT_OTHER): Payer: Medicare Other | Admitting: Lab

## 2012-08-16 ENCOUNTER — Ambulatory Visit (HOSPITAL_BASED_OUTPATIENT_CLINIC_OR_DEPARTMENT_OTHER): Payer: Medicare Other | Admitting: Hematology & Oncology

## 2012-08-16 VITALS — BP 170/65 | HR 61 | Temp 97.6°F | Resp 16 | Ht 68.0 in | Wt 168.0 lb

## 2012-08-16 DIAGNOSIS — D708 Other neutropenia: Secondary | ICD-10-CM

## 2012-08-16 DIAGNOSIS — C50919 Malignant neoplasm of unspecified site of unspecified female breast: Secondary | ICD-10-CM

## 2012-08-16 DIAGNOSIS — D72819 Decreased white blood cell count, unspecified: Secondary | ICD-10-CM

## 2012-08-16 DIAGNOSIS — D696 Thrombocytopenia, unspecified: Secondary | ICD-10-CM

## 2012-08-16 DIAGNOSIS — D472 Monoclonal gammopathy: Secondary | ICD-10-CM

## 2012-08-16 DIAGNOSIS — Z23 Encounter for immunization: Secondary | ICD-10-CM

## 2012-08-16 LAB — CBC WITH DIFFERENTIAL (CANCER CENTER ONLY)
BASO%: 0.5 % (ref 0.0–2.0)
Eosinophils Absolute: 0.2 10*3/uL (ref 0.0–0.5)
MCH: 32.1 pg (ref 26.0–34.0)
MONO#: 0.3 10*3/uL (ref 0.1–0.9)
MONO%: 13.9 % — ABNORMAL HIGH (ref 0.0–13.0)
NEUT#: 1.4 10*3/uL — ABNORMAL LOW (ref 1.5–6.5)
Platelets: 92 10*3/uL — ABNORMAL LOW (ref 145–400)
RBC: 3.86 10*6/uL (ref 3.70–5.32)
RDW: 13.6 % (ref 11.1–15.7)
WBC: 2 10*3/uL — ABNORMAL LOW (ref 3.9–10.0)

## 2012-08-16 LAB — CHCC SATELLITE - SMEAR

## 2012-08-16 MED ORDER — INFLUENZA VIRUS VACC SPLIT PF IM SUSP
0.5000 mL | Freq: Once | INTRAMUSCULAR | Status: AC
  Administered 2012-08-16: 0.5 mL via INTRAMUSCULAR
  Filled 2012-08-16: qty 0.5

## 2012-08-16 NOTE — Progress Notes (Signed)
CC:   Mark A. Perini, M.D.  DIAGNOSES: 1. Leukopenia/thrombocytopenia, stable. 2. IgM kappa MGUS (monoclonal gammopathy of undetermined     significance).  CURRENT THERAPY:  Observation.  INTERIM HISTORY:  Ms. Bazar comes in for followup.  We did do a bone marrow biopsy on her.  This was done back on December 17.  I did the bone marrow because I thought her blood counts were getting worse.  She also had the monoclonal spike that we were following.  I was worried about her having the possibility of an underlying lymphoma.  The bone marrow report ([id number]) showed a hypercellular marrow with only 30% plasma cells.  Everything really looked good with the bone marrow.  We did do cytogenetics on the bone marrow.  The cytogenetics showed a normal bone marrow.  She had FISH studies done.  There was nothing on the FISH studies that looked unusual.  We are following her lab work every few months now.  She feels well.  She had a good Christmas.  Of note, when we last saw her, her monoclonal spike was 0.63 mg/dL.  Her IgM was 893.  Kappa light chain was 53.4 mg/dL.  She has had no headache.  There is no double vision or blurred vision. She has had no cough.  There has been no change in bowel or bladder habits.  She has a little bit of a rash on the right side of her abdomen.  She thinks this may have been from when she was out in the yard moving branches and tree limbs.  PHYSICAL EXAM:  General:  This is a well-developed, well-nourished white female in no obvious distress.  Vital signs:  Show temperature of 97.6, pulse 61, respiratory rate 16, blood pressure 170/65.  Weight is 168. Head and neck:  Shows a normocephalic, atraumatic skull.  There are no ocular or oral lesions.  There are no palpable cervical or supraclavicular lymph nodes.  Lungs are clear bilaterally.  Cardiac: Regular rate and rhythm with a normal S1, S2.  There are no murmurs, rubs or bruits.  Abdomen:  Soft with good  bowel sounds.  There is no palpable abdominal mass.  There is no palpable hepatosplenomegaly. Extremities:  Show no clubbing, cyanosis or edema.  Skin:  No rashes, ecchymoses or petechiae.  LABORATORY STUDIES:  White cell count is 2.0, hemoglobin 12.4, hematocrit 38, platelet count 92,000.  IMPRESSION:  Ms. Diclemente is a 78 year old white female with chronic leukopenia and thrombocytopenia.  Again, we did the bone marrow biopsy on her.  One could suspect myelodysplasia but again the bone marrow was normal.  She had normal cytogenetics.  Whether or not the monoclonal gammopathy is related is unclear but I see nothing that looks significant or that we need to treat.  I think we can probably get her back in 3 months' time now for followup.    ______________________________ Josph Macho, M.D. PRE/MEDQ  D:  08/16/2012  T:  08/16/2012  Job:  [id number]

## 2012-08-16 NOTE — Patient Instructions (Signed)
Influenza Virus Vaccine injection (Fluarix)  What is this medicine?Influenza Virus Vaccine injection (Fluarix) What is this medicine? INFLUENZA VIRUS VACCINE (in floo EN zuh VAHY ruhs vak SEEN) helps to reduce the risk of getting influenza also known as the flu. This medicine may be used for other purposes; ask your health care provider or pharmacist if you have questions. What should I tell my health care provider before I take this medicine? They need to know if you have any of these conditions: -bleeding disorder like hemophilia -fever or infection -Guillain-Barre syndrome or other neurological problems -immune system problems -infection with the human immunodeficiency virus (HIV) or AIDS -low blood platelet counts -multiple sclerosis -an unusual or allergic reaction to influenza virus vaccine, eggs, chicken proteins, latex, gentamicin, other medicines, foods, dyes or preservatives -pregnant or trying to get pregnant -breast-feeding How should I use this medicine? This vaccine is for injection into a muscle. It is given by a health care professional. A copy of Vaccine Information Statements will be given before each vaccination. Read this sheet carefully each time. The sheet may change frequently. Talk to your pediatrician regarding the use of this medicine in children. Special care may be needed. Overdosage: If you think you have taken too much of this medicine contact a poison control center or emergency room at once. NOTE: This medicine is only for you. Do not share this medicine with others. What if I miss a dose? This does not apply. What may interact with this medicine? -chemotherapy or radiation therapy -medicines that lower your immune system like etanercept, anakinra, infliximab, and adalimumab -medicines that treat or prevent blood clots like warfarin -phenytoin -steroid medicines like prednisone or cortisone -theophylline -vaccines This list may not describe all  possible interactions. Give your health care provider a list of all the medicines, herbs, non-prescription drugs, or dietary supplements you use. Also tell them if you smoke, drink alcohol, or use illegal drugs. Some items may interact with your medicine. What should I watch for while using this medicine? Report any side effects that do not go away within 3 days to your doctor or health care professional. Call your health care provider if any unusual symptoms occur within 6 weeks of receiving this vaccine. You may still catch the flu, but the illness is not usually as bad. You cannot get the flu from the vaccine. The vaccine will not protect against colds or other illnesses that may cause fever. The vaccine is needed every year. What side effects may I notice from receiving this medicine? Side effects that you should report to your doctor or health care professional as soon as possible: -allergic reactions like skin rash, itching or hives, swelling of the face, lips, or tongue Side effects that usually do not require medical attention (report to your doctor or health care professional if they continue or are bothersome): -fever -headache -muscle aches and pains -pain, tenderness, redness, or swelling at site where injected -weak or tired This list may not describe all possible side effects. Call your doctor for medical advice about side effects. You may report side effects to FDA at 1-800-FDA-1088. Where should I keep my medicine? This vaccine is only given in a clinic, pharmacy, doctor's office, or other health care setting and will not be stored at home. NOTE: This sheet is a summary. It may not cover all possible information. If you have questions about this medicine, talk to your doctor, pharmacist, or health care provider.  2012, Elsevier/Gold Standard. (02/01/2008 9:30:40 AM)  INFLUENZA VIRUS VACCINE (in floo EN zuh VAHY ruhs vak SEEN) helps to reduce the risk of getting influenza also known  as the flu. This medicine may be used for other purposes; ask your health care provider or pharmacist if you have questions. What should I tell my health care provider before I take this medicine? They need to know if you have any of these conditions: -bleeding disorder like hemophilia -fever or infection -Guillain-Barre syndrome or other neurological problems -immune system problems -infection with the human immunodeficiency virus (HIV) or AIDS -low blood platelet counts -multiple sclerosis -an unusual or allergic reaction to influenza virus vaccine, eggs, chicken proteins, latex, gentamicin, other medicines, foods, dyes or preservatives -pregnant or trying to get pregnant -breast-feeding How should I use this medicine? This vaccine is for injection into a muscle. It is given by a health care professional. A copy of Vaccine Information Statements will be given before each vaccination. Read this sheet carefully each time. The sheet may change frequently. Talk to your pediatrician regarding the use of this medicine in children. Special care may be needed. Overdosage: If you think you have taken too much of this medicine contact a poison control center or emergency room at once. NOTE: This medicine is only for you. Do not share this medicine with others. What if I miss a dose? This does not apply. What may interact with this medicine? -chemotherapy or radiation therapy -medicines that lower your immune system like etanercept, anakinra, infliximab, and adalimumab -medicines that treat or prevent blood clots like warfarin -phenytoin -steroid medicines like prednisone or cortisone -theophylline -vaccines This list may not describe all possible interactions. Give your health care provider a list of all the medicines, herbs, non-prescription drugs, or dietary supplements you use. Also tell them if you smoke, drink alcohol, or use illegal drugs. Some items may interact with your medicine. What  should I watch for while using this medicine? Report any side effects that do not go away within 3 days to your doctor or health care professional. Call your health care provider if any unusual symptoms occur within 6 weeks of receiving this vaccine. You may still catch the flu, but the illness is not usually as bad. You cannot get the flu from the vaccine. The vaccine will not protect against colds or other illnesses that may cause fever. The vaccine is needed every year. What side effects may I notice from receiving this medicine? Side effects that you should report to your doctor or health care professional as soon as possible: -allergic reactions like skin rash, itching or hives, swelling of the face, lips, or tongue Side effects that usually do not require medical attention (report to your doctor or health care professional if they continue or are bothersome): -fever -headache -muscle aches and pains -pain, tenderness, redness, or swelling at site where injected -weak or tired This list may not describe all possible side effects. Call your doctor for medical advice about side effects. You may report side effects to FDA at 1-800-FDA-1088. Where should I keep my medicine? This vaccine is only given in a clinic, pharmacy, doctor's office, or other health care setting and will not be stored at home. NOTE: This sheet is a summary. It may not cover all possible information. If you have questions about this medicine, talk to your doctor, pharmacist, or health care provider.  2012, Elsevier/Gold Standard. (02/01/2008 9:30:40 AM)

## 2012-08-16 NOTE — Progress Notes (Signed)
This office note has been dictated.

## 2012-08-16 NOTE — Procedures (Signed)
[name] was brought to the short stay unit at [facility]. She was here for a bone marrow biopsy.  She had an IV placed without difficulty.  We did the appropriate time- out procedure on her.  Her Mallampati score was 1.  ASA class was 1.  She was then placed onto her right side.  She received a total of 2 mg of Versed and 25 mg of Demerol for IV sedation.  We then prepped and draped the left posterior iliac crest in a sterile fashion.  We infiltrated 8 cc of 2% lidocaine under the skin down to the periosteum.  A #11 scalpel was used to make an incision into the skin. We obtained 2 bone marrow aspirates without difficulty.  We then obtained a bone marrow biopsy core without difficulty.  We dressed the incision site.  The patient tolerated the procedure well.  There were no complications.    ______________________________ Josph Macho, M.D. PRE/MEDQ  D:  08/16/2012  T:  08/16/2012  Job:  [id number]

## 2012-08-18 LAB — IGG, IGA, IGM
IgA: 118 mg/dL (ref 69–380)
IgG (Immunoglobin G), Serum: 600 mg/dL — ABNORMAL LOW (ref 690–1700)

## 2012-08-18 LAB — PROTEIN ELECTROPHORESIS, SERUM, WITH REFLEX
Alpha-2-Globulin: 9.7 % (ref 7.1–11.8)
Gamma Globulin: 18.4 % (ref 11.1–18.8)
M-Spike, %: 0.86 g/dL
Total Protein, Serum Electrophoresis: 6.6 g/dL (ref 6.0–8.3)

## 2012-08-19 NOTE — Procedures (Signed)
[name] [name] was brought to the short-stay unit at [facility]. She had an IV placed without difficulty.  Her Mallampati score was 1.  Her ASA class was 1.  She had the appropriate time-out procedure done.  She was onto her right side.  She received Versed 2.5 mg and Demerol 25 mg for IV sedation.  The left posterior iliac crest region was prepped and draped in a sterile fashion.  2% lidocaine was infiltrated under the skin down to the periosteum.  We used 8 cc.  A #11 scalpel was used to make an incision into the skin.  We then obtained 2 bone marrow aspirates.  These were done without difficulty.  One was sent for flow cytometry and cytogenetics.  We then used a Jamshidi biopsy needle to obtain a bone marrow biopsy core.  This was done without complications.  We dressed the procedure site sterilely.  We applied a dressing.  [name] [name] tolerated the procedure well.  There were no complications.    ______________________________ Josph Macho, M.D. PRE/MEDQ  D:  08/12/2012  T:  08/13/2012  Job:  [id number]

## 2012-08-29 ENCOUNTER — Telehealth: Payer: Self-pay | Admitting: Hematology & Oncology

## 2012-08-29 NOTE — Telephone Encounter (Addendum)
Message copied by Cathi Roan on Mon Aug 29, 2012  4:09 PM ------      Message from: [place], [place] N      Created: Mon Jun 27, 2012  1:02 PM                   ----- Message -----         From: Josph Macho, MD         Sent: 06/24/2012   7:42 AM           To: Onc Nurse Hp            Call and tell that her chest x-ray does not show any pneumonia or fluid. Thanks. Pete ------08-29-12  Called patient and she was aware of results.  Lupita Raider LPN

## 2012-09-29 ENCOUNTER — Other Ambulatory Visit: Payer: Medicare Other | Admitting: Lab

## 2012-09-29 ENCOUNTER — Ambulatory Visit: Payer: Medicare Other | Admitting: Hematology & Oncology

## 2012-11-10 ENCOUNTER — Ambulatory Visit (HOSPITAL_BASED_OUTPATIENT_CLINIC_OR_DEPARTMENT_OTHER): Payer: Medicare Other | Admitting: Lab

## 2012-11-10 ENCOUNTER — Ambulatory Visit (HOSPITAL_BASED_OUTPATIENT_CLINIC_OR_DEPARTMENT_OTHER): Payer: Medicare Other | Admitting: Hematology & Oncology

## 2012-11-10 VITALS — BP 120/50 | HR 56 | Temp 98.1°F | Resp 16 | Ht 68.0 in | Wt 167.0 lb

## 2012-11-10 DIAGNOSIS — D696 Thrombocytopenia, unspecified: Secondary | ICD-10-CM

## 2012-11-10 DIAGNOSIS — D472 Monoclonal gammopathy: Secondary | ICD-10-CM

## 2012-11-10 DIAGNOSIS — D72819 Decreased white blood cell count, unspecified: Secondary | ICD-10-CM

## 2012-11-10 LAB — CBC WITH DIFFERENTIAL (CANCER CENTER ONLY)
BASO#: 0 10e3/uL (ref 0.0–0.2)
BASO%: 1.3 % (ref 0.0–2.0)
EOS%: 2.2 % (ref 0.0–7.0)
Eosinophils Absolute: 0.1 10e3/uL (ref 0.0–0.5)
HCT: 37.2 % (ref 34.8–46.6)
HGB: 12.4 g/dL (ref 11.6–15.9)
LYMPH#: 0.2 10e3/uL — ABNORMAL LOW (ref 0.9–3.3)
LYMPH%: 7.8 % — ABNORMAL LOW (ref 14.0–48.0)
MCH: 33 pg (ref 26.0–34.0)
MCHC: 33.3 g/dL (ref 32.0–36.0)
MCV: 99 fL (ref 81–101)
MONO#: 0.3 10e3/uL (ref 0.1–0.9)
MONO%: 13.9 % — ABNORMAL HIGH (ref 0.0–13.0)
NEUT#: 1.7 10e3/uL (ref 1.5–6.5)
NEUT%: 74.8 % (ref 39.6–80.0)
Platelets: 100 10e3/uL — ABNORMAL LOW (ref 145–400)
RBC: 3.76 10e6/uL (ref 3.70–5.32)
RDW: 14.6 % (ref 11.1–15.7)
WBC: 2.3 10e3/uL — ABNORMAL LOW (ref 3.9–10.0)

## 2012-11-10 LAB — CHCC SATELLITE - SMEAR

## 2012-11-10 NOTE — Progress Notes (Signed)
This office note has been dictated.
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
## 2012-11-16 LAB — PROTEIN ELECTROPHORESIS, SERUM, WITH REFLEX
Alpha-2-Globulin: 9.3 % (ref 7.1–11.8)
Gamma Globulin: 17.7 % (ref 11.1–18.8)
M-Spike, %: 0.8 g/dL
Total Protein, Serum Electrophoresis: 6.7 g/dL (ref 6.0–8.3)

## 2012-11-16 LAB — KAPPA/LAMBDA LIGHT CHAINS: Kappa free light chain: 52.2 mg/dL — ABNORMAL HIGH (ref 0.33–1.94)

## 2012-11-16 LAB — IGG, IGA, IGM: IgG (Immunoglobin G), Serum: 666 mg/dL — ABNORMAL LOW (ref 690–1700)

## 2012-11-16 LAB — IFE INTERPRETATION

## 2013-03-16 ENCOUNTER — Encounter: Payer: Self-pay | Admitting: Hematology & Oncology

## 2013-03-16 ENCOUNTER — Ambulatory Visit (HOSPITAL_BASED_OUTPATIENT_CLINIC_OR_DEPARTMENT_OTHER): Payer: Medicare Other | Admitting: Hematology & Oncology

## 2013-03-16 ENCOUNTER — Ambulatory Visit (HOSPITAL_BASED_OUTPATIENT_CLINIC_OR_DEPARTMENT_OTHER): Payer: Medicare Other | Admitting: Lab

## 2013-03-16 VITALS — BP 148/54 | HR 62 | Temp 98.0°F | Resp 16 | Ht 67.0 in | Wt 168.0 lb

## 2013-03-16 DIAGNOSIS — D472 Monoclonal gammopathy: Secondary | ICD-10-CM

## 2013-03-16 DIAGNOSIS — D696 Thrombocytopenia, unspecified: Secondary | ICD-10-CM

## 2013-03-16 DIAGNOSIS — D72819 Decreased white blood cell count, unspecified: Secondary | ICD-10-CM

## 2013-03-16 HISTORY — DX: Monoclonal gammopathy: D47.2

## 2013-03-16 LAB — CBC WITH DIFFERENTIAL (CANCER CENTER ONLY)
BASO#: 0 10*3/uL (ref 0.0–0.2)
BASO%: 0.6 % (ref 0.0–2.0)
EOS%: 2.8 % (ref 0.0–7.0)
HCT: 35.8 % (ref 34.8–46.6)
HGB: 11.9 g/dL (ref 11.6–15.9)
MCH: 33.1 pg (ref 26.0–34.0)
MCHC: 33.2 g/dL (ref 32.0–36.0)
MONO%: 13.4 % — ABNORMAL HIGH (ref 0.0–13.0)
NEUT%: 74.3 % (ref 39.6–80.0)
RDW: 13.8 % (ref 11.1–15.7)

## 2013-03-16 NOTE — Progress Notes (Signed)
This office note has been dictated.

## 2013-03-17 NOTE — Progress Notes (Signed)
CC:   Mark A. Perini, M.D.  DIAGNOSIS: 1. Chronic leukopenia/thrombocytopenia. 2. IgM Kappa MGUS.  CURRENT THERAPY:  Observation.  INTERIM HISTORY:  Ms. Casler comes in for followup.  We last saw her back in April.  At that point in time, her monoclonal spike was holding steady at 0.8 g/dL.  Her IgM level was 1030 mg/dL.  Kappa light chains 52.2 mg/dL.  We had a bone marrow done on her back in December, 2013.  This only showed 3% plasma cells.  I have not yet done a CT scan on her.  I have not found anything on her physical exam that would suggest an underlying issue. She has had no abdominal pain.  There has been no cough.  She has had no fever.  There have been no rashes.  She has had a couple skin cancers removed.  PHYSICAL EXAMINATION:  General:  This is a well-developed, well- nourished white female in no obvious distress.  Vital signs: Temperature of 98, pulse 62, respiratory rate 16, blood pressure 148/54. Weight is 168.  Head and neck:  Normocephalic, atraumatic skull.  There are no ocular or oral lesions.  There are no palpable cervical or supraclavicular lymph nodes.  Lungs:  Clear bilaterally.  Cardiac: Regular rate and rhythm with a normal S1, S2.  There are no murmurs, rubs or bruits.  Abdomen:  Soft.  She has good bowel sounds.  There is no fluid wave.  There is no palpable hepatosplenomegaly.  Axillary shows no bilateral axillary adenopathy.  Extremities:  Show no clubbing, cyanosis or edema.  LABORATORY STUDIES:  White cell count 1.8, hemoglobin 12, hematocrit 36, platelet count 86,000.  MCV is 100.  IMPRESSION:  Ms. Piazza is a very charming 78 year old white female.  I have been following her for several years.  I still have not uncovered any obvious hematologic issue on her.  She has IgM kappa MGUS.  I just have to follow this as she is asymptomatic.  I could always consider doing a CAT scan on her looking for the possibility of lymphoma and her blood  counts being a paraneoplastic process.  I want see her back in 4 more months.    ______________________________ Josph Macho, M.D. PRE/MEDQ  D:  03/16/2013  T:  03/17/2013  Job:  [id number]

## 2013-03-21 LAB — KAPPA/LAMBDA LIGHT CHAINS
Kappa free light chain: 48.9 mg/dL — ABNORMAL HIGH (ref 0.33–1.94)
Lambda Free Lght Chn: 0.97 mg/dL (ref 0.57–2.63)

## 2013-03-21 LAB — PROTEIN ELECTROPHORESIS, SERUM, WITH REFLEX
Albumin ELP: 55.2 % — ABNORMAL LOW (ref 55.8–66.1)
Alpha-1-Globulin: 5.1 % — ABNORMAL HIGH (ref 2.9–4.9)
Beta 2: 2.8 % — ABNORMAL LOW (ref 3.2–6.5)
Beta Globulin: 7 % (ref 4.7–7.2)
Gamma Globulin: 19.7 % — ABNORMAL HIGH (ref 11.1–18.8)

## 2013-03-21 LAB — IFE INTERPRETATION

## 2013-03-21 LAB — IGG, IGA, IGM
IgA: 91 mg/dL (ref 69–380)
IgG (Immunoglobin G), Serum: 552 mg/dL — ABNORMAL LOW (ref 690–1700)

## 2013-04-17 NOTE — Progress Notes (Signed)
This office note has been dictated.

## 2013-07-14 ENCOUNTER — Other Ambulatory Visit: Payer: Self-pay | Admitting: Nurse Practitioner

## 2013-07-14 DIAGNOSIS — D472 Monoclonal gammopathy: Secondary | ICD-10-CM

## 2013-07-17 ENCOUNTER — Ambulatory Visit (HOSPITAL_BASED_OUTPATIENT_CLINIC_OR_DEPARTMENT_OTHER): Payer: Medicare Other | Admitting: Hematology & Oncology

## 2013-07-17 ENCOUNTER — Ambulatory Visit (HOSPITAL_BASED_OUTPATIENT_CLINIC_OR_DEPARTMENT_OTHER): Payer: Medicare Other

## 2013-07-17 ENCOUNTER — Other Ambulatory Visit (HOSPITAL_BASED_OUTPATIENT_CLINIC_OR_DEPARTMENT_OTHER): Payer: Medicare Other | Admitting: Lab

## 2013-07-17 VITALS — BP 138/48 | HR 60 | Temp 97.8°F | Resp 14 | Ht 66.0 in | Wt 166.0 lb

## 2013-07-17 DIAGNOSIS — D472 Monoclonal gammopathy: Secondary | ICD-10-CM

## 2013-07-17 DIAGNOSIS — Z23 Encounter for immunization: Secondary | ICD-10-CM

## 2013-07-17 DIAGNOSIS — D696 Thrombocytopenia, unspecified: Secondary | ICD-10-CM

## 2013-07-17 DIAGNOSIS — D61818 Other pancytopenia: Secondary | ICD-10-CM

## 2013-07-17 LAB — CBC WITH DIFFERENTIAL (CANCER CENTER ONLY)
BASO#: 0 10*3/uL (ref 0.0–0.2)
EOS%: 5.6 % (ref 0.0–7.0)
Eosinophils Absolute: 0.1 10*3/uL (ref 0.0–0.5)
HGB: 11.6 g/dL (ref 11.6–15.9)
LYMPH#: 0.1 10*3/uL — ABNORMAL LOW (ref 0.9–3.3)
MCH: 33.6 pg (ref 26.0–34.0)
MONO%: 9.4 % (ref 0.0–13.0)
NEUT#: 1.4 10*3/uL — ABNORMAL LOW (ref 1.5–6.5)
Platelets: 98 10*3/uL — ABNORMAL LOW (ref 145–400)
RBC: 3.45 10*6/uL — ABNORMAL LOW (ref 3.70–5.32)
WBC: 1.8 10*3/uL — ABNORMAL LOW (ref 3.9–10.0)

## 2013-07-17 MED ORDER — INFLUENZA VAC SPLIT QUAD 0.5 ML IM SUSP
0.5000 mL | Freq: Once | INTRAMUSCULAR | Status: AC
  Administered 2013-07-17: 0.5 mL via INTRAMUSCULAR
  Filled 2013-07-17: qty 0.5

## 2013-07-17 NOTE — Progress Notes (Signed)
This office note has been dictated.

## 2013-07-17 NOTE — Patient Instructions (Signed)

## 2013-07-17 NOTE — Addendum Note (Signed)
Addended by: Arlan Organ R on: 07/17/2013 01:05 PM   Modules accepted: Orders

## 2013-07-18 NOTE — Progress Notes (Signed)
CC:   Mark A. Perini, M.D.  DIAGNOSES: 1. Chronic leukopenia/thrombocytopenia. 2. IgM kappa, monoclonal gammopathy of undetermined significance.  CURRENT THERAPY:  Observation.  INTERIM HISTORY:  [name] [name] comes in for followup.  She is doing fairly well.  She has had no problems since we last saw her.  She has this chronic leukopenia and thrombocytopenia.  We did do a bone marrow biopsy on her back in December 2013.  This showed hypercellular marrow.  The cytogenetics were pretty much unremarkable.  She only had 3% plasma cells.  We have been following her monoclonal studies.  When we last saw her in August, her monoclonal spike was 0.8 mg/dL.  IgM level was 1100 mg/dL. Kappa light chain was 49 mg/dL.  She has had no fevers, sweats, or chills.  There has been no weight loss or weight gain.  She has had no abdominal pain.  There has been no change in bowel or bladder habits.  She does have some chronic lower back discomfort.  She has had no obvious leg swelling.  There has been no rashes.  PHYSICAL EXAMINATION:  On physical exam, this is a well-developed, well- nourished white female in no obvious distress.  Vital signs show temperature of 97.8, pulse 60, respiratory rate 14, blood pressure 138/48, weight is 166 pounds.  Head and neck exam shows a normocephalic, atraumatic skull.  There are no ocular or oral lesions.  There are no palpable cervical or supraclavicular lymph nodes.  Lungs are clear bilaterally.  Cardiac Exam:  Regular rate and rhythm with a normal S1 and S2.  There are no murmurs, rubs, or bruits.  Abdomen is soft.  She has good bowel sounds.  There is no palpable abdominal mass.  There is no palpable hepatosplenomegaly.  Back Exam:  No tenderness over the spine, ribs, or hips.  Extremities shows no clubbing, cyanosis, or edema.  Neurological exam shows no focal neurological deficits.  Skin exam shows no rashes, ecchymoses, or petechia.  LABORATORY STUDIES:   White cell count is 1.8, hemoglobin 11.6, hematocrit 34.8, platelet count 98,000.  [name] [name] is a 78 year old white female with this chronic leukopenia and thrombocytopenia.  We have been following this now for a couple years. Her blood counts just were seemed to hold themselves pretty stable.  Again, we did do a bone marrow biopsy on her year ago which came out okay.  The cytogenetics that we sent off came out with normal cytogenetics and a negative FISH analysis.  Again, we will continue to follow her along.  One would think that if she had an underlying malignancy, that this would manifest itself at some point.  I will plan to see her back in another 4 months.    ______________________________ Josph Macho, M.D. PRE/MEDQ  D:  07/17/2013  T:  07/18/2013  Job:  [id number]

## 2013-07-19 LAB — KAPPA/LAMBDA LIGHT CHAINS
Kappa free light chain: 52.9 mg/dL — ABNORMAL HIGH (ref 0.33–1.94)
Kappa:Lambda Ratio: 28.91 — ABNORMAL HIGH (ref 0.26–1.65)
Lambda Free Lght Chn: 1.83 mg/dL (ref 0.57–2.63)

## 2013-07-19 LAB — COMPREHENSIVE METABOLIC PANEL
AST: 22 U/L (ref 0–37)
Albumin: 3.7 g/dL (ref 3.5–5.2)
Alkaline Phosphatase: 57 U/L (ref 39–117)
BUN: 9 mg/dL (ref 6–23)
Calcium: 9.1 mg/dL (ref 8.4–10.5)
Chloride: 100 mEq/L (ref 96–112)
Glucose, Bld: 99 mg/dL (ref 70–99)
Potassium: 3.3 mEq/L — ABNORMAL LOW (ref 3.5–5.3)
Sodium: 139 mEq/L (ref 135–145)
Total Protein: 6.4 g/dL (ref 6.0–8.3)

## 2013-07-19 LAB — SPEP & IFE WITH QIG
Albumin ELP: 55.6 % — ABNORMAL LOW (ref 55.8–66.1)
Alpha-1-Globulin: 6.8 % — ABNORMAL HIGH (ref 2.9–4.9)
Beta 2: 2.1 % — ABNORMAL LOW (ref 3.2–6.5)
Beta Globulin: 6.8 % (ref 4.7–7.2)
Gamma Globulin: 19 % — ABNORMAL HIGH (ref 11.1–18.8)

## 2013-07-19 LAB — LACTATE DEHYDROGENASE: LDH: 139 U/L (ref 94–250)

## 2013-11-13 ENCOUNTER — Ambulatory Visit (HOSPITAL_BASED_OUTPATIENT_CLINIC_OR_DEPARTMENT_OTHER): Payer: Medicare Other | Admitting: Hematology & Oncology

## 2013-11-13 ENCOUNTER — Ambulatory Visit (HOSPITAL_BASED_OUTPATIENT_CLINIC_OR_DEPARTMENT_OTHER): Payer: Medicare Other | Admitting: Lab

## 2013-11-13 ENCOUNTER — Telehealth: Payer: Self-pay | Admitting: Hematology & Oncology

## 2013-11-13 ENCOUNTER — Encounter: Payer: Self-pay | Admitting: Hematology & Oncology

## 2013-11-13 VITALS — BP 150/66 | HR 61 | Temp 97.8°F | Resp 14 | Ht 68.0 in | Wt 165.0 lb

## 2013-11-13 DIAGNOSIS — D472 Monoclonal gammopathy: Secondary | ICD-10-CM

## 2013-11-13 DIAGNOSIS — D72819 Decreased white blood cell count, unspecified: Secondary | ICD-10-CM

## 2013-11-13 DIAGNOSIS — D696 Thrombocytopenia, unspecified: Secondary | ICD-10-CM

## 2013-11-13 LAB — CBC WITH DIFFERENTIAL (CANCER CENTER ONLY)
BASO#: 0 10*3/uL (ref 0.0–0.2)
BASO%: 0.5 % (ref 0.0–2.0)
EOS%: 3.4 % (ref 0.0–7.0)
Eosinophils Absolute: 0.1 10*3/uL (ref 0.0–0.5)
HEMATOCRIT: 36.1 % (ref 34.8–46.6)
HGB: 12 g/dL (ref 11.6–15.9)
LYMPH#: 0.1 10*3/uL — ABNORMAL LOW (ref 0.9–3.3)
LYMPH%: 6.8 % — AB (ref 14.0–48.0)
MCH: 32.2 pg (ref 26.0–34.0)
MCHC: 33.2 g/dL (ref 32.0–36.0)
MCV: 97 fL (ref 81–101)
MONO#: 0.3 10*3/uL (ref 0.1–0.9)
MONO%: 15.1 % — AB (ref 0.0–13.0)
NEUT#: 1.5 10*3/uL (ref 1.5–6.5)
NEUT%: 74.2 % (ref 39.6–80.0)
Platelets: 88 10*3/uL — ABNORMAL LOW (ref 145–400)
RBC: 3.73 10*6/uL (ref 3.70–5.32)
RDW: 14.2 % (ref 11.1–15.7)
WBC: 2.1 10*3/uL — AB (ref 3.9–10.0)

## 2013-11-13 LAB — CHCC SATELLITE - SMEAR

## 2013-11-13 NOTE — Progress Notes (Signed)
Hematology and Oncology Follow Up Visit  [name] [name] [id number] 03/03/1935 79 y.o. 11/13/2013   Principle Diagnosis:  . Chronic leukopenia/thrombocytopenia. 2. IgM kappa, monoclonal gammopathy of undetermined significance.  Current Therapy:    Observation     Interim History:  Ms.  [name] is back for followup there was are back in December. She had no problems over the wintertime. She had a possible infection. She does have low bit of contact dermatitis on her right forearm. She thinks it might be some poison oak from working in the yard over the weekend.  We are following her monoclonal spike. We last saw her, her M spike was 0.76 g/dL. Her kappa Light chain was 53 mg/dL. IgM level was 1070 mg/dL.  Despite all of our studies, we've not yet found a type of myelo derivative or lympho-proliferative process.  She's had no joint aches or pains. There's been no change in bowel or bladder habits. She's had a good appetite. She's had no weight loss or weight gain.  Medications: Current outpatient prescriptions:aspirin EC 81 MG tablet, Take 81 mg by mouth 2 (two) times a week. , Disp: , Rfl: ;  benazepril (LOTENSIN) 20 MG tablet, Take 10 mg by mouth at bedtime., Disp: , Rfl: ;  escitalopram (LEXAPRO) 10 MG tablet, Take 5 mg by mouth every morning. , Disp: , Rfl: ;  fish oil-omega-3 fatty acids 1000 MG capsule, Take 2 g by mouth every morning. , Disp: , Rfl:  ibuprofen (ADVIL,MOTRIN) 200 MG tablet, Take 400 mg by mouth every 6 (six) hours as needed. For back pain., Disp: , Rfl: ;  Multiple Vitamin (MULTIVITAMIN) capsule, Take 1 capsule by mouth every morning. , Disp: , Rfl: ;  propranolol ER (INDERAL LA) 80 MG 24 hr capsule, Take 80 mg by mouth every morning. , Disp: , Rfl: ;  raloxifene (EVISTA) 60 MG tablet, Take 60 mg by mouth every morning. , Disp: , Rfl:  rosuvastatin (CRESTOR) 20 MG tablet, Take 10 mg by mouth every morning. , Disp: , Rfl: ;  spironolactone-hydrochlorothiazide (ALDACTAZIDE) 25-25  MG per tablet, Take 1 tablet by mouth every morning. , Disp: , Rfl: ;  Vitamin D, Ergocalciferol, (DRISDOL) 50000 UNITS CAPS, Take 50,000 Units by mouth every Monday. , Disp: , Rfl:   Allergies: No Known Allergies  Past Medical History, Surgical history, Social history, and Family History were reviewed and updated.  Review of Systems: As above  Physical Exam:  height is 5\' 8"  (1.727 m) and weight is 165 lb (74.844 kg). Her oral temperature is 97.8 F (36.6 C). Her blood pressure is 150/66 and her pulse is 61. Her respiration is 14.   Lymph nodes showed no lymphadenopathy in the neck or axilla. Lungs are clear. Cardiac exam regular in rhythm with no murmurs rubs or bruits. Abdomen is soft. She's good bowel sounds. There is no fluid wave. There is no palpable liver or spleen tip. Exam no tenderness over the spine ribs or hips. Extremities shows no clubbing cyanosis or edema. Neurological exam shows no focal neurological deficits.  Lab Results  Component Value Date   WBC 2.1* 11/13/2013   HGB 12.0 11/13/2013   HCT 36.1 11/13/2013   MCV 97 11/13/2013   PLT 88* 11/13/2013     Chemistry      Component Value Date/Time   NA 139 07/17/2013 1154   K 3.3* 07/17/2013 1154   CL 100 07/17/2013 1154   CO2 29 07/17/2013 1154   BUN 9 07/17/2013 1154  CREATININE 0.98 07/17/2013 1154      Component Value Date/Time   CALCIUM 9.1 07/17/2013 1154   ALKPHOS 57 07/17/2013 1154   AST 22 07/17/2013 1154   ALT 11 07/17/2013 1154   BILITOT 0.5 07/17/2013 1154         Impression and Plan: [name] [name] is 79 year-old white female with leukopenia and thrombocytopenia. She has an IgM kappa spike. Again our tests have been all negative.  We still have to follow her along. I think with irregular can stable right now, we can probably get her back after Labor Day.  I looked at her blood smear. I still do not see anything that looked suspicious.   Volanda Napoleon, MD 4/27/20151:41 PM

## 2013-11-13 NOTE — Telephone Encounter (Signed)
Mailed 03-2014 schedule °

## 2013-11-15 LAB — IGG, IGA, IGM
IGA: 94 mg/dL (ref 69–380)
IGM, SERUM: 992 mg/dL — AB (ref 52–322)
IgG (Immunoglobin G), Serum: 633 mg/dL — ABNORMAL LOW (ref 690–1700)

## 2013-11-15 LAB — LACTATE DEHYDROGENASE: LDH: 155 U/L (ref 94–250)

## 2013-11-15 LAB — KAPPA/LAMBDA LIGHT CHAINS
KAPPA LAMBDA RATIO: 42.11 — AB (ref 0.26–1.65)
Kappa free light chain: 53.9 mg/dL — ABNORMAL HIGH (ref 0.33–1.94)
Lambda Free Lght Chn: 1.28 mg/dL (ref 0.57–2.63)

## 2013-11-15 LAB — PROTEIN ELECTROPHORESIS, SERUM, WITH REFLEX
Albumin ELP: 55.4 % — ABNORMAL LOW (ref 55.8–66.1)
Alpha-1-Globulin: 4.9 % (ref 2.9–4.9)
Alpha-2-Globulin: 10.5 % (ref 7.1–11.8)
Beta 2: 2.3 % — ABNORMAL LOW (ref 3.2–6.5)
Beta Globulin: 6.6 % (ref 4.7–7.2)
GAMMA GLOBULIN: 20.3 % — AB (ref 11.1–18.8)
M-SPIKE, %: 0.84 g/dL
Total Protein, Serum Electrophoresis: 6.4 g/dL (ref 6.0–8.3)

## 2013-11-15 LAB — IFE INTERPRETATION

## 2014-04-09 ENCOUNTER — Ambulatory Visit (HOSPITAL_BASED_OUTPATIENT_CLINIC_OR_DEPARTMENT_OTHER): Payer: Medicare Other | Admitting: Lab

## 2014-04-09 ENCOUNTER — Ambulatory Visit (HOSPITAL_BASED_OUTPATIENT_CLINIC_OR_DEPARTMENT_OTHER): Payer: Medicare Other | Admitting: Hematology & Oncology

## 2014-04-09 ENCOUNTER — Encounter: Payer: Self-pay | Admitting: Hematology & Oncology

## 2014-04-09 VITALS — BP 119/47 | HR 60 | Temp 98.8°F | Resp 14 | Ht 68.0 in | Wt 159.0 lb

## 2014-04-09 DIAGNOSIS — L989 Disorder of the skin and subcutaneous tissue, unspecified: Secondary | ICD-10-CM

## 2014-04-09 DIAGNOSIS — D472 Monoclonal gammopathy: Secondary | ICD-10-CM

## 2014-04-09 LAB — CBC WITH DIFFERENTIAL (CANCER CENTER ONLY)
BASO#: 0 10*3/uL (ref 0.0–0.2)
BASO%: 0.8 % (ref 0.0–2.0)
EOS ABS: 0.1 10*3/uL (ref 0.0–0.5)
EOS%: 3.1 % (ref 0.0–7.0)
HCT: 34.5 % — ABNORMAL LOW (ref 34.8–46.6)
HEMOGLOBIN: 11.4 g/dL — AB (ref 11.6–15.9)
LYMPH#: 0.3 10*3/uL — ABNORMAL LOW (ref 0.9–3.3)
LYMPH%: 9.8 % — ABNORMAL LOW (ref 14.0–48.0)
MCH: 32.8 pg (ref 26.0–34.0)
MCHC: 33 g/dL (ref 32.0–36.0)
MCV: 99 fL (ref 81–101)
MONO#: 0.3 10*3/uL (ref 0.1–0.9)
MONO%: 12.2 % (ref 0.0–13.0)
NEUT%: 74.1 % (ref 39.6–80.0)
NEUTROS ABS: 1.9 10*3/uL (ref 1.5–6.5)
Platelets: 98 10*3/uL — ABNORMAL LOW (ref 145–400)
RBC: 3.48 10*6/uL — ABNORMAL LOW (ref 3.70–5.32)
RDW: 13.3 % (ref 11.1–15.7)
WBC: 2.5 10*3/uL — ABNORMAL LOW (ref 3.9–10.0)

## 2014-04-09 LAB — CHCC SATELLITE - SMEAR

## 2014-04-09 NOTE — Progress Notes (Signed)
Hematology and Oncology Follow Up Visit  [name] [name] [id number] 09/12/1934 79 y.o. 04/09/2014   Principle Diagnosis:  Chronic leukopenia/thrombocytopenia. IgM kappa, monoclonal gammopathy of undetermined significance.  Current Therapy:    observation     Interim History:  [name] [name] is back for followup she'll call well. She does got back into town from a 24 high school reunion. She had a great time.  Her issue now that there is a lesion on the lateral of her left lower leg. This looks like a skin cancer it. It may be squamous cell carcinoma. Other this has to be removed. I will see if surgery can do this for [place].  I last saw her, her monoclonal spike was 0.84 g/dL which is holding steady. Her IgM level was 992 mg/dL. Her kappa light chain was 54 mg/dL. These are all stable. She's had no infections. She had no problems with bowels or bladder. She's had no cough. She's had no mouth sores.  Medications: Current outpatient prescriptions:aspirin EC 81 MG tablet, Take 81 mg by mouth 2 (two) times a week. , Disp: , Rfl: ;  benazepril (LOTENSIN) 20 MG tablet, Take 10 mg by mouth daily. , Disp: , Rfl: ;  escitalopram (LEXAPRO) 10 MG tablet, Take 5 mg by mouth every morning. , Disp: , Rfl: ;  fish oil-omega-3 fatty acids 1000 MG capsule, Take 2 g by mouth every morning. , Disp: , Rfl:  ibuprofen (ADVIL,MOTRIN) 200 MG tablet, Take 400 mg by mouth every 6 (six) hours as needed. For back pain., Disp: , Rfl: ;  Multiple Vitamin (MULTIVITAMIN) capsule, Take 1 capsule by mouth every morning. , Disp: , Rfl: ;  propranolol ER (INDERAL LA) 80 MG 24 hr capsule, Take 80 mg by mouth every morning. , Disp: , Rfl: ;  raloxifene (EVISTA) 60 MG tablet, Take 60 mg by mouth every morning. , Disp: , Rfl:  rosuvastatin (CRESTOR) 20 MG tablet, Take 10 mg by mouth every morning. , Disp: , Rfl: ;  spironolactone-hydrochlorothiazide (ALDACTAZIDE) 25-25 MG per tablet, Take 1 tablet by mouth every morning. , Disp: , Rfl: ;   Vitamin D, Ergocalciferol, (DRISDOL) 50000 UNITS CAPS, Take 50,000 Units by mouth every Monday. , Disp: , Rfl:   Allergies: No Known Allergies  Past Medical History, Surgical history, Social history, and Family History were reviewed and updated.  Review of Systems: As above  Physical Exam:  height is 5\' 8"  (1.727 m) and weight is 159 lb (72.122 kg). Her oral temperature is 98.8 F (37.1 C). Her blood pressure is 119/47 and her pulse is 60. Her respiration is 14.   Lungs are clear. Cardiac exam regular in rhythm. Lymph nodes exam is non-palpable with any lymph node. Abdomen soft. Has good bowel sounds. There is no fluid wave. There is a palpable liver or spleen. Back exam shows no kyphosis. There is no tenderness over the spine ribs or hips. Extremities shows a 2 cm firm lesion in the left lower leg. This is just to the left of the midline.it is nontender. It is quite firm. Neurological exam is non-focal.  Lab Results  Component Value Date   WBC 2.5* 04/09/2014   HGB 11.4* 04/09/2014   HCT 34.5* 04/09/2014   MCV 99 04/09/2014   PLT 98* 04/09/2014     Chemistry      Component Value Date/Time   NA 139 07/17/2013 1154   K 3.3* 07/17/2013 1154   CL 100 07/17/2013 1154   CO2 29 07/17/2013  1154   BUN 9 07/17/2013 1154   CREATININE 0.98 07/17/2013 1154      Component Value Date/Time   CALCIUM 9.1 07/17/2013 1154   ALKPHOS 57 07/17/2013 1154   AST 22 07/17/2013 1154   ALT 11 07/17/2013 1154   BILITOT 0.5 07/17/2013 1154         Impression and Plan: [name] [name] is 79 year old female. We will see her for several years. Her white cell count is improving. Her platelet count is about the same.  I will set her blood smear. I do not see anything that looked unusual. There is no rouleau formation. There were no immature myeloid or lymphoid cells. Platelets looked normal. She had no atypical lymphocytes.  Again, we will see back in her to surgery.  I'll see her back myself in 6  months.   Volanda Napoleon, MD 9/21/20151:58 PM

## 2014-04-11 LAB — IGG, IGA, IGM
IGA: 101 mg/dL (ref 69–380)
IgG (Immunoglobin G), Serum: 580 mg/dL — ABNORMAL LOW (ref 690–1700)
IgM, Serum: 1280 mg/dL — ABNORMAL HIGH (ref 52–322)

## 2014-04-11 LAB — KAPPA/LAMBDA LIGHT CHAINS
KAPPA FREE LGHT CHN: 64.7 mg/dL — AB (ref 0.33–1.94)
KAPPA LAMBDA RATIO: 53.03 — AB (ref 0.26–1.65)
LAMBDA FREE LGHT CHN: 1.22 mg/dL (ref 0.57–2.63)

## 2014-04-11 LAB — PROTEIN ELECTROPHORESIS, SERUM, WITH REFLEX
ALBUMIN ELP: 59 % (ref 55.8–66.1)
Alpha-1-Globulin: 4.6 % (ref 2.9–4.9)
Alpha-2-Globulin: 9.5 % (ref 7.1–11.8)
BETA 2: 2.2 % — AB (ref 3.2–6.5)
BETA GLOBULIN: 6.6 % (ref 4.7–7.2)
GAMMA GLOBULIN: 18.1 % (ref 11.1–18.8)
M-SPIKE, %: 0.79 g/dL
Total Protein, Serum Electrophoresis: 6.5 g/dL (ref 6.0–8.3)

## 2014-04-11 LAB — IFE INTERPRETATION

## 2014-04-11 LAB — LACTATE DEHYDROGENASE: LDH: 135 U/L (ref 94–250)

## 2014-04-20 ENCOUNTER — Encounter: Payer: Self-pay | Admitting: Hematology & Oncology

## 2014-04-26 ENCOUNTER — Other Ambulatory Visit (INDEPENDENT_AMBULATORY_CARE_PROVIDER_SITE_OTHER): Payer: Self-pay | Admitting: General Surgery

## 2014-04-26 DIAGNOSIS — R2242 Localized swelling, mass and lump, left lower limb: Secondary | ICD-10-CM

## 2014-04-29 NOTE — Progress Notes (Signed)
Quick Note:  Inform patient of Pathology report,. As expected, this is A squamous cell carcinoma of the skin. I will discuss definitive surgery to achieve a clean margin at next office visit.  hmi ______

## 2014-06-18 ENCOUNTER — Telehealth (INDEPENDENT_AMBULATORY_CARE_PROVIDER_SITE_OTHER): Payer: Self-pay

## 2014-06-18 ENCOUNTER — Ambulatory Visit (HOSPITAL_COMMUNITY)
Admission: RE | Admit: 2014-06-18 | Discharge: 2014-06-18 | Disposition: A | Discharge status: Home / Self Care | Payer: Medicare Other | Source: Ambulatory Visit | Attending: Internal Medicine | Admitting: Internal Medicine

## 2014-06-18 DIAGNOSIS — M7989 Other specified soft tissue disorders: Principal | ICD-10-CM

## 2014-06-18 DIAGNOSIS — M79605 Pain in left leg: Secondary | ICD-10-CM | POA: Diagnosis not present

## 2014-06-18 DIAGNOSIS — M79662 Pain in left lower leg: Secondary | ICD-10-CM

## 2014-06-18 DIAGNOSIS — R609 Edema, unspecified: Secondary | ICD-10-CM

## 2014-06-18 NOTE — Telephone Encounter (Signed)
Pt seen in office today by Dr Dalbert Batman and orders placed for STAT lower venous doppler study to r/o DVT in epic.

## 2014-06-18 NOTE — Progress Notes (Signed)
*  PRELIMINARY RESULTS* Vascular Ultrasound Left lower extremity venous duplex has been completed.  Preliminary findings: no evidence of DVT or SVT.  Called results to [facility] and Dr. Darrel Hoover office.   Landry Mellow, RDMS, RVT  06/18/2014, 2:55 PM

## 2014-10-08 ENCOUNTER — Ambulatory Visit (HOSPITAL_BASED_OUTPATIENT_CLINIC_OR_DEPARTMENT_OTHER): Payer: Medicare Other | Admitting: Family

## 2014-10-08 ENCOUNTER — Ambulatory Visit: Payer: Medicare Other | Admitting: Family

## 2014-10-08 ENCOUNTER — Encounter: Payer: Self-pay | Admitting: Family

## 2014-10-08 ENCOUNTER — Other Ambulatory Visit: Payer: Medicare Other | Admitting: Lab

## 2014-10-08 ENCOUNTER — Ambulatory Visit (HOSPITAL_BASED_OUTPATIENT_CLINIC_OR_DEPARTMENT_OTHER): Payer: Medicare Other | Admitting: Lab

## 2014-10-08 VITALS — BP 143/67 | HR 79 | Temp 97.8°F | Resp 14 | Ht 68.0 in | Wt 151.0 lb

## 2014-10-08 DIAGNOSIS — D696 Thrombocytopenia, unspecified: Secondary | ICD-10-CM | POA: Diagnosis not present

## 2014-10-08 DIAGNOSIS — D72819 Decreased white blood cell count, unspecified: Secondary | ICD-10-CM

## 2014-10-08 DIAGNOSIS — D472 Monoclonal gammopathy: Secondary | ICD-10-CM

## 2014-10-08 LAB — CBC WITH DIFFERENTIAL (CANCER CENTER ONLY)
BASO#: 0 10*3/uL (ref 0.0–0.2)
BASO%: 0.5 % (ref 0.0–2.0)
EOS%: 2.4 % (ref 0.0–7.0)
Eosinophils Absolute: 0.1 10*3/uL (ref 0.0–0.5)
HCT: 35.9 % (ref 34.8–46.6)
HEMOGLOBIN: 12.2 g/dL (ref 11.6–15.9)
LYMPH#: 0.2 10*3/uL — ABNORMAL LOW (ref 0.9–3.3)
LYMPH%: 7.8 % — AB (ref 14.0–48.0)
MCH: 32.9 pg (ref 26.0–34.0)
MCHC: 34 g/dL (ref 32.0–36.0)
MCV: 97 fL (ref 81–101)
MONO#: 0.2 10*3/uL (ref 0.1–0.9)
MONO%: 9.7 % (ref 0.0–13.0)
NEUT#: 1.6 10*3/uL (ref 1.5–6.5)
NEUT%: 79.6 % (ref 39.6–80.0)
PLATELETS: 86 10*3/uL — AB (ref 145–400)
RBC: 3.71 10*6/uL (ref 3.70–5.32)
RDW: 14.9 % (ref 11.1–15.7)
WBC: 2.1 10*3/uL — ABNORMAL LOW (ref 3.9–10.0)

## 2014-10-08 LAB — CMP (CANCER CENTER ONLY)
ALBUMIN: 3.8 g/dL (ref 3.3–5.5)
ALT(SGPT): 15 U/L (ref 10–47)
AST: 29 U/L (ref 11–38)
Alkaline Phosphatase: 71 U/L (ref 26–84)
BUN, Bld: 5 mg/dL — ABNORMAL LOW (ref 7–22)
CALCIUM: 9.4 mg/dL (ref 8.0–10.3)
CHLORIDE: 98 meq/L (ref 98–108)
CO2: 31 mEq/L (ref 18–33)
Creat: 1 mg/dl (ref 0.6–1.2)
Glucose, Bld: 98 mg/dL (ref 73–118)
POTASSIUM: 3.3 meq/L (ref 3.3–4.7)
Sodium: 137 mEq/L (ref 128–145)
Total Bilirubin: 0.8 mg/dl (ref 0.20–1.60)
Total Protein: 6.9 g/dL (ref 6.4–8.1)

## 2014-10-08 LAB — CHCC SATELLITE - SMEAR

## 2014-10-08 NOTE — Progress Notes (Signed)
Hematology and Oncology Follow Up Visit  [name] [name] [id number] 1935-04-26 80 y.o. 10/08/2014   Principle Diagnosis:  Chronic leukopenia/thrombocytopenia IgM kappa, monoclonal gammopathy of undetermined significance  Current Therapy:   Observation    Interim History:  [name] [name] is here today for a follow-up. She is getting along quite well. She is ready for the weather to warm up. She has had some issues with arthritis in her back. She states that this is not a new issue.  She had the lesion removed from her left lower leg and it was a squamous cell carcinoma. She is being followed by Dr. Dalbert Batman She is asymptomatic at this time. No c/o fatigue, fever, chills, n/v, cough, rash, dizziness, SOB, abdominal pain, constipation, diarrhea, blood in urine or stool.  No swelling, tenderness, numbness or tingling in her extremities.  Her appetite is good and she is staying hydrated.  In September, her monoclonal spike was 0.79 g/dl. Her IgM was 1280 mg/dl and kappa light chain was 64.7 mg/dl. She has had no infections.   Medications:    Medication List       This list is accurate as of: 10/08/14  3:50 PM.  Always use your most recent med list.               aspirin EC 81 MG tablet  Take 81 mg by mouth 3 (three) times a week.     benazepril 20 MG tablet  Commonly known as:  LOTENSIN  Take 20 mg by mouth daily.     escitalopram 10 MG tablet  Commonly known as:  LEXAPRO  Take 5 mg by mouth every morning.     fish oil-omega-3 fatty acids 1000 MG capsule  Take 2 g by mouth every morning.     ibuprofen 200 MG tablet  Commonly known as:  ADVIL,MOTRIN  Take 400 mg by mouth every 6 (six) hours as needed. For back pain.     methocarbamol 500 MG tablet  Commonly known as:  ROBAXIN  Take 500 mg by mouth every 8 (eight) hours as needed.     multivitamin capsule  Take 1 capsule by mouth every morning.     propranolol ER 80 MG 24 hr capsule  Commonly known as:  INDERAL LA  Take 80 mg  by mouth every morning.     raloxifene 60 MG tablet  Commonly known as:  EVISTA  Take 60 mg by mouth every morning.     rosuvastatin 20 MG tablet  Commonly known as:  CRESTOR  Take 10 mg by mouth every morning.     spironolactone-hydrochlorothiazide 25-25 MG per tablet  Commonly known as:  ALDACTAZIDE  Take 1 tablet by mouth every morning. TAKES 1/2 TAB DAILY     Vitamin D (Ergocalciferol) 50000 UNITS Caps capsule  Commonly known as:  DRISDOL  Take 50,000 Units by mouth every Monday.        Allergies: No Known Allergies  Past Medical History, Surgical history, Social history, and Family History were reviewed and updated.  Review of Systems: All other 10 point review of systems is negative.   Physical Exam:  height is 5\' 8"  (1.727 m) and weight is 151 lb (68.493 kg). Her oral temperature is 97.8 F (36.6 C). Her blood pressure is 143/67 and her pulse is 79. Her respiration is 14.   Wt Readings from Last 3 Encounters:  10/08/14 151 lb (68.493 kg)  04/09/14 159 lb (72.122 kg)  11/13/13 165 lb (74.844 kg)  Ocular: Sclerae unicteric, pupils equal, round and reactive to light Ear-nose-throat: Oropharynx clear, dentition fair Lymphatic: No cervical or supraclavicular adenopathy Lungs no rales or rhonchi, good excursion bilaterally Heart regular rate and rhythm, no murmur appreciated Abd soft, nontender, positive bowel sounds MSK no focal spinal tenderness, no joint edema Neuro: non-focal, well-oriented, appropriate affect Breasts: Deferred  Lab Results  Component Value Date   WBC 2.1* 10/08/2014   HGB 12.2 10/08/2014   HCT 35.9 10/08/2014   MCV 97 10/08/2014   PLT 86* 10/08/2014   Lab Results  Component Value Date   FERRITIN 58 08/29/2009   Lab Results  Component Value Date   RETICCTPCT 2.5* 04/01/2011   RBC 3.71 10/08/2014   RETICCTABS 94.8 04/01/2011   Lab Results  Component Value Date   KPAFRELGTCHN 64.70* 04/09/2014   LAMBDASER 1.22 04/09/2014    KAPLAMBRATIO 53.03* 04/09/2014   Lab Results  Component Value Date   IGGSERUM 580* 04/09/2014   IGA 101 04/09/2014   IGMSERUM 1280* 04/09/2014   Lab Results  Component Value Date   TOTALPROTELP 6.5 04/09/2014   ALBUMINELP 59.0 04/09/2014   A1GS 4.6 04/09/2014   A2GS 9.5 04/09/2014   BETS 6.6 04/09/2014   BETA2SER 2.2* 04/09/2014   GAMS 18.1 04/09/2014   MSPIKE 0.79 04/09/2014   SPEI * 04/09/2014     Chemistry      Component Value Date/Time   NA 137 10/08/2014 1321   NA 139 07/17/2013 1154   K 3.3 10/08/2014 1321   K 3.3* 07/17/2013 1154   CL 98 10/08/2014 1321   CL 100 07/17/2013 1154   CO2 31 10/08/2014 1321   CO2 29 07/17/2013 1154   BUN 5* 10/08/2014 1321   BUN 9 07/17/2013 1154   CREATININE 1.0 10/08/2014 1321   CREATININE 0.98 07/17/2013 1154      Component Value Date/Time   CALCIUM 9.4 10/08/2014 1321   CALCIUM 9.1 07/17/2013 1154   ALKPHOS 71 10/08/2014 1321   ALKPHOS 57 07/17/2013 1154   AST 29 10/08/2014 1321   AST 22 07/17/2013 1154   ALT 15 10/08/2014 1321   ALT 11 07/17/2013 1154   BILITOT 0.80 10/08/2014 1321   BILITOT 0.5 07/17/2013 1154     Impression and Plan: [name] [name] is 80 year old female with MGUS. She is asymptomatic at this time and doing quite well.  Her WBC count is 2.1 and platelets are 86. She has had no problem with infections and no episodes of bleeding. We will see what the rest of her blood work shows.  Dr. Marin Olp will view her blood smear.  We will see her back in 6 months for labs and follow-up.  She knows to call here with any questions or concerns. We can certainly see her sooner if need be.   Eliezer Bottom, NP 3/21/20163:50 PM

## 2014-10-11 LAB — KAPPA/LAMBDA LIGHT CHAINS
KAPPA LAMBDA RATIO: 85.95 — AB (ref 0.26–1.65)
Kappa free light chain: 72.2 mg/dL — ABNORMAL HIGH (ref 0.33–1.94)
Lambda Free Lght Chn: 0.84 mg/dL (ref 0.57–2.63)

## 2014-10-11 LAB — IFE INTERPRETATION

## 2014-10-11 LAB — PROTEIN ELECTROPHORESIS, SERUM, WITH REFLEX
ALPHA-1-GLOBULIN: 0.3 g/dL (ref 0.2–0.3)
ALPHA-2-GLOBULIN: 0.7 g/dL (ref 0.5–0.9)
Abnormal Protein Band1: 0.9 g/dL
Albumin ELP: 3.9 g/dL (ref 3.8–4.8)
BETA 2: 0.2 g/dL (ref 0.2–0.5)
Beta Globulin: 0.4 g/dL (ref 0.4–0.6)
GAMMA GLOBULIN: 1.4 g/dL (ref 0.8–1.7)
Total Protein, Serum Electrophoresis: 7 g/dL (ref 6.1–8.1)

## 2014-10-11 LAB — IGG, IGA, IGM
IGA: 95 mg/dL (ref 69–380)
IgG (Immunoglobin G), Serum: 653 mg/dL — ABNORMAL LOW (ref 690–1700)
IgM, Serum: 1300 mg/dL — ABNORMAL HIGH (ref 52–322)

## 2014-10-11 LAB — LACTATE DEHYDROGENASE: LDH: 155 U/L (ref 94–250)

## 2015-03-14 ENCOUNTER — Encounter (HOSPITAL_COMMUNITY): Payer: Self-pay

## 2015-03-14 ENCOUNTER — Emergency Department (HOSPITAL_COMMUNITY): Payer: Medicare Other

## 2015-03-14 ENCOUNTER — Inpatient Hospital Stay (HOSPITAL_COMMUNITY)
Admission: EM | Admit: 2015-03-14 | Discharge: 2015-03-21 | DRG: 683 | Disposition: A | Discharge status: Home Health | Payer: Medicare Other | Attending: Internal Medicine | Admitting: Internal Medicine

## 2015-03-14 DIAGNOSIS — I951 Orthostatic hypotension: Secondary | ICD-10-CM | POA: Diagnosis present

## 2015-03-14 DIAGNOSIS — E785 Hyperlipidemia, unspecified: Secondary | ICD-10-CM | POA: Diagnosis present

## 2015-03-14 DIAGNOSIS — N179 Acute kidney failure, unspecified: Secondary | ICD-10-CM | POA: Diagnosis not present

## 2015-03-14 DIAGNOSIS — Z87891 Personal history of nicotine dependence: Secondary | ICD-10-CM

## 2015-03-14 DIAGNOSIS — Z79899 Other long term (current) drug therapy: Secondary | ICD-10-CM

## 2015-03-14 DIAGNOSIS — E119 Type 2 diabetes mellitus without complications: Secondary | ICD-10-CM | POA: Diagnosis present

## 2015-03-14 DIAGNOSIS — I959 Hypotension, unspecified: Secondary | ICD-10-CM | POA: Diagnosis present

## 2015-03-14 DIAGNOSIS — Z6821 Body mass index (BMI) 21.0-21.9, adult: Secondary | ICD-10-CM

## 2015-03-14 DIAGNOSIS — R918 Other nonspecific abnormal finding of lung field: Secondary | ICD-10-CM | POA: Diagnosis present

## 2015-03-14 DIAGNOSIS — E559 Vitamin D deficiency, unspecified: Secondary | ICD-10-CM | POA: Diagnosis present

## 2015-03-14 DIAGNOSIS — M546 Pain in thoracic spine: Secondary | ICD-10-CM

## 2015-03-14 DIAGNOSIS — Z85828 Personal history of other malignant neoplasm of skin: Secondary | ICD-10-CM

## 2015-03-14 DIAGNOSIS — I313 Pericardial effusion (noninflammatory): Secondary | ICD-10-CM | POA: Diagnosis present

## 2015-03-14 DIAGNOSIS — K7689 Other specified diseases of liver: Secondary | ICD-10-CM | POA: Diagnosis present

## 2015-03-14 DIAGNOSIS — Z23 Encounter for immunization: Secondary | ICD-10-CM

## 2015-03-14 DIAGNOSIS — R161 Splenomegaly, not elsewhere classified: Secondary | ICD-10-CM | POA: Diagnosis present

## 2015-03-14 DIAGNOSIS — E278 Other specified disorders of adrenal gland: Secondary | ICD-10-CM | POA: Insufficient documentation

## 2015-03-14 DIAGNOSIS — E279 Disorder of adrenal gland, unspecified: Secondary | ICD-10-CM | POA: Diagnosis present

## 2015-03-14 DIAGNOSIS — R160 Hepatomegaly, not elsewhere classified: Secondary | ICD-10-CM

## 2015-03-14 DIAGNOSIS — Z9012 Acquired absence of left breast and nipple: Secondary | ICD-10-CM | POA: Diagnosis present

## 2015-03-14 DIAGNOSIS — Z853 Personal history of malignant neoplasm of breast: Secondary | ICD-10-CM

## 2015-03-14 DIAGNOSIS — D696 Thrombocytopenia, unspecified: Secondary | ICD-10-CM | POA: Diagnosis present

## 2015-03-14 DIAGNOSIS — M810 Age-related osteoporosis without current pathological fracture: Secondary | ICD-10-CM | POA: Diagnosis present

## 2015-03-14 DIAGNOSIS — D72819 Decreased white blood cell count, unspecified: Secondary | ICD-10-CM | POA: Diagnosis present

## 2015-03-14 DIAGNOSIS — Z7982 Long term (current) use of aspirin: Secondary | ICD-10-CM

## 2015-03-14 DIAGNOSIS — D61818 Other pancytopenia: Secondary | ICD-10-CM | POA: Diagnosis present

## 2015-03-14 DIAGNOSIS — R911 Solitary pulmonary nodule: Secondary | ICD-10-CM | POA: Diagnosis not present

## 2015-03-14 DIAGNOSIS — I5032 Chronic diastolic (congestive) heart failure: Secondary | ICD-10-CM | POA: Diagnosis present

## 2015-03-14 DIAGNOSIS — E440 Moderate protein-calorie malnutrition: Secondary | ICD-10-CM | POA: Diagnosis present

## 2015-03-14 DIAGNOSIS — I10 Essential (primary) hypertension: Secondary | ICD-10-CM | POA: Diagnosis present

## 2015-03-14 DIAGNOSIS — D472 Monoclonal gammopathy: Secondary | ICD-10-CM | POA: Diagnosis present

## 2015-03-14 DIAGNOSIS — E860 Dehydration: Secondary | ICD-10-CM | POA: Diagnosis present

## 2015-03-14 DIAGNOSIS — R55 Syncope and collapse: Secondary | ICD-10-CM | POA: Diagnosis not present

## 2015-03-14 HISTORY — DX: Dorsalgia, unspecified: M54.9

## 2015-03-14 LAB — CBC WITH DIFFERENTIAL/PLATELET
Basophils Absolute: 0 10*3/uL (ref 0.0–0.1)
Basophils Relative: 1 % (ref 0–1)
EOS ABS: 0.1 10*3/uL (ref 0.0–0.7)
Eosinophils Relative: 3 % (ref 0–5)
HEMATOCRIT: 29.3 % — AB (ref 36.0–46.0)
Hemoglobin: 10.1 g/dL — ABNORMAL LOW (ref 12.0–15.0)
LYMPHS ABS: 0.1 10*3/uL — AB (ref 0.7–4.0)
LYMPHS PCT: 8 % — AB (ref 12–46)
MCH: 32.7 pg (ref 26.0–34.0)
MCHC: 34.5 g/dL (ref 30.0–36.0)
MCV: 94.8 fL (ref 78.0–100.0)
Monocytes Absolute: 0.2 10*3/uL (ref 0.1–1.0)
Monocytes Relative: 11 % (ref 3–12)
NEUTROS ABS: 1.1 10*3/uL — AB (ref 1.7–7.7)
Neutrophils Relative %: 77 % (ref 43–77)
PLATELETS: 90 10*3/uL — AB (ref 150–400)
RBC: 3.09 MIL/uL — AB (ref 3.87–5.11)
RDW: 14.6 % (ref 11.5–15.5)
WBC: 1.5 10*3/uL — AB (ref 4.0–10.5)

## 2015-03-14 LAB — COMPREHENSIVE METABOLIC PANEL
ALBUMIN: 3.3 g/dL — AB (ref 3.5–5.0)
ALT: 14 U/L (ref 14–54)
AST: 28 U/L (ref 15–41)
Alkaline Phosphatase: 60 U/L (ref 38–126)
Anion gap: 3 — ABNORMAL LOW (ref 5–15)
BUN: 19 mg/dL (ref 6–20)
CHLORIDE: 99 mmol/L — AB (ref 101–111)
CO2: 28 mmol/L (ref 22–32)
CREATININE: 1.81 mg/dL — AB (ref 0.44–1.00)
Calcium: 8.8 mg/dL — ABNORMAL LOW (ref 8.9–10.3)
GFR calc Af Amer: 30 mL/min — ABNORMAL LOW (ref >60)
GFR calc non Af Amer: 26 mL/min — ABNORMAL LOW (ref >60)
GLUCOSE: 112 mg/dL — AB (ref 65–99)
Potassium: 4.1 mmol/L (ref 3.5–5.1)
Sodium: 130 mmol/L — ABNORMAL LOW (ref 135–145)
Total Bilirubin: 0.6 mg/dL (ref 0.3–1.2)
Total Protein: 6.2 g/dL — ABNORMAL LOW (ref 6.5–8.1)

## 2015-03-14 LAB — D-DIMER, QUANTITATIVE: D-Dimer, Quant: 13.92 ug/mL-FEU — ABNORMAL HIGH (ref 0.00–0.48)

## 2015-03-14 LAB — BRAIN NATRIURETIC PEPTIDE: B Natriuretic Peptide: 23.4 pg/mL (ref 0.0–100.0)

## 2015-03-14 LAB — TYPE AND SCREEN
ABO/RH(D): B POS
ANTIBODY SCREEN: NEGATIVE

## 2015-03-14 LAB — CBG MONITORING, ED: GLUCOSE-CAPILLARY: 99 mg/dL (ref 65–99)

## 2015-03-14 LAB — I-STAT CG4 LACTIC ACID, ED: LACTIC ACID, VENOUS: 1.08 mmol/L (ref 0.5–2.0)

## 2015-03-14 LAB — ABO/RH: ABO/RH(D): B POS

## 2015-03-14 LAB — TROPONIN I: Troponin I: <0.03 ng/mL (ref <0.031)

## 2015-03-14 LAB — PROTIME-INR
INR: 1.23 (ref 0.00–1.49)
Prothrombin Time: 15.6 seconds — ABNORMAL HIGH (ref 11.6–15.2)

## 2015-03-14 LAB — LIPASE, BLOOD: Lipase: 54 U/L — ABNORMAL HIGH (ref 22–51)

## 2015-03-14 MED ORDER — METHOCARBAMOL 500 MG PO TABS
500.0000 mg | ORAL_TABLET | Freq: Three times a day (TID) | ORAL | Status: DC | PRN
  Administered 2015-03-21: 500 mg via ORAL
  Filled 2015-03-14 (×2): qty 1

## 2015-03-14 MED ORDER — OMEGA-3-ACID ETHYL ESTERS 1 G PO CAPS
1.0000 g | ORAL_CAPSULE | Freq: Every morning | ORAL | Status: DC
  Administered 2015-03-15 – 2015-03-17 (×3): 1 g via ORAL
  Filled 2015-03-14 (×7): qty 1

## 2015-03-14 MED ORDER — ONDANSETRON HCL 4 MG/2ML IJ SOLN
4.0000 mg | Freq: Four times a day (QID) | INTRAMUSCULAR | Status: DC | PRN
Start: 2015-03-14 — End: 2015-03-21
  Administered 2015-03-17: 4 mg via INTRAVENOUS
  Filled 2015-03-14: qty 2

## 2015-03-14 MED ORDER — ONDANSETRON HCL 4 MG PO TABS: 4.0000 mg | ORAL_TABLET | Freq: Four times a day (QID) | ORAL | Status: DC | PRN

## 2015-03-14 MED ORDER — SODIUM CHLORIDE 0.9 % IV BOLUS (SEPSIS)
1000.0000 mL | Freq: Once | INTRAVENOUS | Status: AC
  Administered 2015-03-14: 1000 mL via INTRAVENOUS

## 2015-03-14 MED ORDER — INFLUENZA VAC SPLIT QUAD 0.5 ML IM SUSY
0.5000 mL | PREFILLED_SYRINGE | INTRAMUSCULAR | Status: AC
  Administered 2015-03-15: 0.5 mL via INTRAMUSCULAR
  Filled 2015-03-14: qty 0.5

## 2015-03-14 MED ORDER — ESCITALOPRAM OXALATE 5 MG PO TABS
5.0000 mg | ORAL_TABLET | Freq: Every morning | ORAL | Status: DC
  Administered 2015-03-15 – 2015-03-21 (×7): 5 mg via ORAL
  Filled 2015-03-14 (×7): qty 1

## 2015-03-14 MED ORDER — SODIUM CHLORIDE 0.9 % IV SOLN
INTRAVENOUS | Status: DC
  Administered 2015-03-15 (×2): via INTRAVENOUS

## 2015-03-14 MED ORDER — ROSUVASTATIN CALCIUM 10 MG PO TABS
10.0000 mg | ORAL_TABLET | Freq: Every morning | ORAL | Status: DC
  Administered 2015-03-15 – 2015-03-20 (×6): 10 mg via ORAL
  Filled 2015-03-14 (×8): qty 1

## 2015-03-14 MED ORDER — ACETAMINOPHEN 650 MG RE SUPP: 650.0000 mg | Freq: Four times a day (QID) | RECTAL | Status: DC | PRN

## 2015-03-14 MED ORDER — ASPIRIN EC 81 MG PO TBEC
81.0000 mg | DELAYED_RELEASE_TABLET | ORAL | Status: DC
  Administered 2015-03-15: 81 mg via ORAL
  Filled 2015-03-14: qty 1

## 2015-03-14 MED ORDER — ACETAMINOPHEN 325 MG PO TABS
650.0000 mg | ORAL_TABLET | Freq: Four times a day (QID) | ORAL | Status: DC | PRN
  Administered 2015-03-20: 650 mg via ORAL
  Filled 2015-03-14: qty 2

## 2015-03-14 NOTE — ED Notes (Signed)
Patient transported to X-ray 

## 2015-03-14 NOTE — ED Notes (Signed)
Lattie Haw aware of patients soft bp.

## 2015-03-14 NOTE — ED Notes (Signed)
In and out cath performed.  Verbal order given by lisa/rn

## 2015-03-14 NOTE — ED Notes (Signed)
Patient transported to CT 

## 2015-03-14 NOTE — ED Provider Notes (Signed)
CSN: [id number]     Arrival date & time 03/14/15  1812 History   First MD Initiated Contact with Patient 03/14/15 1815     Chief Complaint  Patient presents with  . Loss of Consciousness  . Hypotension  . Fall     (Consider location/radiation/quality/duration/timing/severity/associated sxs/prior Treatment) HPI Patient reports she has not felt well for a couple of weeks. She states she has been having some pain in her right thoracic back. As been coming and going. She states is not there today. When it is there, it is aching in quality. Patient has not had a fever that she is aware of. She denies cough. She reports she has had significantly decreased appetite. She has had nausea but denies active vomiting or diarrhea. The patient states that she lives alone. Friends had asked her if they wanted her to bring something over, they came by with some food and the patient became very lightheaded when trying to open the door and fell into the arms of one of her friends. At that time she did not go to the floor. Her daughter subsequently came to stay with her, then a second episode occurred whereby the patient became lightheaded with standing and did fall to the carpeted floor. She denies that she has any headache or neck pain. She denies other extremity pain. She does identify the ongoing right thoracic back pain that she's been having although she states is not there right now.  EMS reports the blood pressure was 82/48 in a supine position. Blood sugars were normal. The patient was awake and appropriate. They administered 500 mL of normal saline on route.  Patient states she has seen Dr. Gertie Baron in the past for very low white blood cell count. She reports that they did bone marrow biopsies but never found a cause for it. Past Medical History  Diagnosis Date  . Abnormal WBC count     low, h/o  . Other and unspecified hyperlipidemia   . Unspecified essential hypertension   . OP (osteoporosis)   .  Vitamin D deficiency   . MGUS (monoclonal gammopathy of unknown significance) 03/16/2013  . Back pain   . Breast cancer   . DM type 2 (diabetes mellitus, type 2)     Pt states she is 'prediabetic'   Past Surgical History  Procedure Laterality Date  . Mastectomy      left  . Breast biopsy      right  . Breast reconstruction    . Skin cancer removal      basal cell skin cancer  . Wisdom tooth extraction    . Tooth extraction     Family History  Problem Relation Age of Onset  . Liver cancer Father   . Diabetes Father    Social History  Substance Use Topics  . Smoking status: Former Smoker -- 1.00 packs/day for 10 years    Types: Cigarettes    Start date: 05/15/1956    Quit date: 07/05/1966  . Smokeless tobacco: Never Used     Comment: quit 45 years ago  . Alcohol Use: No   OB History    No data available     Review of Systems  10 Systems reviewed and are negative for acute change except as noted in the HPI.   Allergies  Review of patient's allergies indicates no known allergies.  Home Medications   Prior to Admission medications   Medication Sig Start Date End Date Taking? Authorizing Provider  aspirin  EC 81 MG tablet Take 81 mg by mouth once a week.    Yes Historical Provider, MD  benazepril (LOTENSIN) 20 MG tablet Take 10 mg by mouth daily.    Yes Historical Provider, MD  escitalopram (LEXAPRO) 10 MG tablet Take 5 mg by mouth every morning.    Yes Historical Provider, MD  fish oil-omega-3 fatty acids 1000 MG capsule Take 2 g by mouth every morning.    Yes Historical Provider, MD  ibuprofen (ADVIL,MOTRIN) 200 MG tablet Take 400 mg by mouth every 6 (six) hours as needed. For back pain.   Yes Historical Provider, MD  methocarbamol (ROBAXIN) 500 MG tablet Take 500 mg by mouth every 8 (eight) hours as needed for muscle spasms.  08/16/14  Yes Historical Provider, MD  Multiple Vitamin (MULTIVITAMIN) capsule Take 1 capsule by mouth every morning.    Yes Historical  Provider, MD  propranolol ER (INDERAL LA) 80 MG 24 hr capsule Take 80 mg by mouth every morning.  09/21/11  Yes Historical Provider, MD  raloxifene (EVISTA) 60 MG tablet Take 60 mg by mouth every morning.    Yes Historical Provider, MD  rosuvastatin (CRESTOR) 20 MG tablet Take 10 mg by mouth every morning.    Yes Historical Provider, MD  spironolactone-hydrochlorothiazide (ALDACTAZIDE) 25-25 MG per tablet Take 0.5 tablets by mouth every other day. TAKES 1/2 TAB DAILY 09/21/11  Yes Historical Provider, MD  Vitamin D, Ergocalciferol, (DRISDOL) 50000 UNITS CAPS Take 50,000 Units by mouth every Monday.    Yes Historical Provider, MD   BP 92/49 mmHg  Pulse 75  Temp(Src) 97.6 F (36.4 C) (Oral)  Resp 21  Ht 5\' 8"  (1.727 m)  Wt   SpO2 100% Physical Exam  Constitutional: She is oriented to person, place, and time. She appears well-developed and well-nourished.  HENT:  Head: Normocephalic and atraumatic.  Eyes: EOM are normal. Pupils are equal, round, and reactive to light.  Neck: Neck supple.  Cardiovascular: Normal rate, regular rhythm and normal heart sounds.   Distal pulses are 1+  Pulmonary/Chest: Effort normal and breath sounds normal. She exhibits no tenderness.  No chest wall rash.  Abdominal: Soft. Bowel sounds are normal. She exhibits no distension. There is no tenderness.  Musculoskeletal: Normal range of motion. She exhibits no edema or tenderness.  Neurological: She is alert and oriented to person, place, and time. She has normal strength. Coordination normal. GCS eye subscore is 4. GCS verbal subscore is 5. GCS motor subscore is 6.  Skin: Skin is warm, dry and intact.  Psychiatric: She has a normal mood and affect.    ED Course  Procedures (including critical care time) Labs Review Labs Reviewed  COMPREHENSIVE METABOLIC PANEL - Abnormal; Notable for the following:    Sodium 130 (*)    Chloride 99 (*)    Glucose, Bld 112 (*)    Creatinine, Ser 1.81 (*)    Calcium 8.8 (*)     Total Protein 6.2 (*)    Albumin 3.3 (*)    GFR calc non Af Amer 26 (*)    GFR calc Af Amer 30 (*)    Anion gap 3 (*)    All other components within normal limits  LIPASE, BLOOD - Abnormal; Notable for the following:    Lipase 54 (*)    All other components within normal limits  CBC WITH DIFFERENTIAL/PLATELET - Abnormal; Notable for the following:    WBC 1.5 (*)    RBC 3.09 (*)  Hemoglobin 10.1 (*)    HCT 29.3 (*)    Platelets 90 (*)    Neutro Abs 1.1 (*)    Lymphocytes Relative 8 (*)    Lymphs Abs 0.1 (*)    All other components within normal limits  D-DIMER, QUANTITATIVE (NOT AT [facility]) - Abnormal; Notable for the following:    D-Dimer, Quant 13.92 (*)    All other components within normal limits  PROTIME-INR - Abnormal; Notable for the following:    Prothrombin Time 15.6 (*)    All other components within normal limits  CULTURE, BLOOD (ROUTINE X 2)  CULTURE, BLOOD (ROUTINE X 2)  BRAIN NATRIURETIC PEPTIDE  TROPONIN I  URINALYSIS, ROUTINE W REFLEX MICROSCOPIC (NOT AT [facility])  I-STAT CG4 LACTIC ACID, ED  CBG MONITORING, ED  TYPE AND SCREEN  ABO/RH    Imaging Review Ct Head Wo Contrast  03/14/2015   CLINICAL DATA:  [place] backwards today.  Loss of consciousness.  EXAM: CT HEAD WITHOUT CONTRAST  TECHNIQUE: Contiguous axial images were obtained from the base of the skull through the vertex without intravenous contrast.  COMPARISON:  09/12/2010.  FINDINGS: Diffusely enlarged ventricles and subarachnoid spaces. No skull fracture, intracranial hemorrhage or paranasal sinus air-fluid levels.  IMPRESSION: Mild atrophy.  No acute abnormality.   Electronically Signed   By: Claudie Revering M.D.   On: 03/14/2015 19:32   Dg Chest Port 1 View  03/14/2015   CLINICAL DATA:  Status post fall.  History of breast cancer.  EXAM: PORTABLE CHEST - 1 VIEW  COMPARISON:  June 23, 2012  FINDINGS: The heart size and mediastinal contours are stable. There is no focal infiltrate, pulmonary edema, or pleural  effusion. Left axillary surgical clips are identified unchanged. The visualized skeletal structures are stable.  IMPRESSION: No active cardiopulmonary disease.   Electronically Signed   By: Abelardo Diesel M.D.   On: 03/14/2015 18:45   I have personally reviewed and evaluated these images and lab results as part of my medical decision-making.   EKG Interpretation   Date/Time:  Thursday March 14 2015 18:29:49 EDT Ventricular Rate:  71 PR Interval:  169 QRS Duration: 78 QT Interval:  435 QTC Calculation: 473 R Axis:   67 Text Interpretation:  Sinus rhythm Low voltage, extremity and precordial  leads agree.no STEMI, no change from old Confirmed by Johnney Killian, MD, Jeannie Done  [phone]) on 03/14/2015 6:52:05 PM     Consult: 2135 case reviewed with Dr.Kakarkandy. At this time due to the patient's renal insufficiency, he plans to do a VQ scan over contrast CT for PE study. She'll be admitted for monitoring and further diagnostic studies. MDM   Final diagnoses:  Orthostatic hypotension  Syncope and collapse  Right-sided thoracic back pain   Patient will be admitted for further diagnostic evaluation of syncope and weakness.     Charlesetta Shanks, MD 03/27/15 [phone]

## 2015-03-14 NOTE — H&P (Addendum)
[facility] History and Physical  [name] [name] UKG:[id number] DOB: Dec 14, 1934 DOA: 03/14/2015  Referring physician: Dr. Vallery Ridge. PCP: Jerlyn Ly, MD  Specialists: Dr. Marin Olp. Oncologist.  Chief Complaint: Loss of consciousness.  HPI: [name] [name] is a 80 y.o. female and history of hypertension, chronic anemia thrombocytopenia and leukopenia with history of MGUS being followed by oncologist, previous history of breast cancer was brought to the ER the patient had 2 episodes of syncope today. Both episodes happened within 1 hour interval. Both the time patient was walking. Patient states of recently patient has been feeling dizzy when she tries to stand up. Patient also was told by her primary care physician to decrease her spironolactone HCTZ due to low blood pressures a few months ago. Patient denies any nausea vomiting abdominal pain diarrhea chest pain or shortness of breath or cough or any fever chills. When the EMS arrived patient's blood pressure was in the 76E systolic. Patient also was hypotensive in the ER. Patient was given 2 L normal saline bolus following which patient blood pressure improved to 831 systolic. Patient was not tachycardic or febrile. Patient is complaining of some pain in the right upper back for which CT chest was done without contrast which only shows lung nodule and small to moderate pericardial effusion and bilateral adrenal nodules with splenomegaly. Patient's d-dimer is significantly elevated but labs show acute renal failure. Patient will be admitted for further management. On my exam patient is not in distress. Patient states of recently patient has not been eating well and has poor appetite.  Review of Systems: As presented in the history of presenting illness, rest negative.  Past Medical History  Diagnosis Date  . Abnormal WBC count     low, h/o  . Other and unspecified hyperlipidemia   . Unspecified essential hypertension   . OP (osteoporosis)   .  Vitamin D deficiency   . MGUS (monoclonal gammopathy of unknown significance) 03/16/2013  . Back pain   . Breast cancer   . DM type 2 (diabetes mellitus, type 2)     Pt states she is 'prediabetic'   Past Surgical History  Procedure Laterality Date  . Mastectomy      left  . Breast biopsy      right  . Breast reconstruction    . Skin cancer removal      basal cell skin cancer  . Wisdom tooth extraction    . Tooth extraction     Social History:  reports that she quit smoking about 48 years ago. Her smoking use included Cigarettes. She started smoking about 58 years ago. She has a 10 pack-year smoking history. She has never used smokeless tobacco. She reports that she does not drink alcohol or use illicit drugs. Where does patient live home. Can patient participate in ADLs? Yes.  No Known Allergies  Family History:  Family History  Problem Relation Age of Onset  . Liver cancer Father   . Diabetes Father       Prior to Admission medications   Medication Sig Start Date End Date Taking? Authorizing Provider  aspirin EC 81 MG tablet Take 81 mg by mouth once a week.    Yes Historical Provider, MD  benazepril (LOTENSIN) 20 MG tablet Take 10 mg by mouth daily.    Yes Historical Provider, MD  escitalopram (LEXAPRO) 10 MG tablet Take 5 mg by mouth every morning.    Yes Historical Provider, MD  fish oil-omega-3 fatty acids 1000 MG capsule Take 2  g by mouth every morning.    Yes Historical Provider, MD  ibuprofen (ADVIL,MOTRIN) 200 MG tablet Take 400 mg by mouth every 6 (six) hours as needed. For back pain.   Yes Historical Provider, MD  methocarbamol (ROBAXIN) 500 MG tablet Take 500 mg by mouth every 8 (eight) hours as needed for muscle spasms.  08/16/14  Yes Historical Provider, MD  Multiple Vitamin (MULTIVITAMIN) capsule Take 1 capsule by mouth every morning.    Yes Historical Provider, MD  propranolol ER (INDERAL LA) 80 MG 24 hr capsule Take 80 mg by mouth every morning.  09/21/11  Yes  Historical Provider, MD  raloxifene (EVISTA) 60 MG tablet Take 60 mg by mouth every morning.    Yes Historical Provider, MD  rosuvastatin (CRESTOR) 20 MG tablet Take 10 mg by mouth every morning.    Yes Historical Provider, MD  spironolactone-hydrochlorothiazide (ALDACTAZIDE) 25-25 MG per tablet Take 0.5 tablets by mouth every other day. TAKES 1/2 TAB DAILY 09/21/11  Yes Historical Provider, MD  Vitamin D, Ergocalciferol, (DRISDOL) 50000 UNITS CAPS Take 50,000 Units by mouth every Monday.    Yes Historical Provider, MD    Physical Exam: Filed Vitals:   03/14/15 1828 03/14/15 2026 03/14/15 2234 03/14/15 2257  BP: 100/74 92/49  113/48  Pulse: 71 75 70 82  Temp: 97.7 F (36.5 C) 97.6 F (36.4 C)  97.5 F (36.4 C)  TempSrc: Oral Oral Oral Oral  Resp: 16 21  20   Height: 5\' 8"  (1.727 m)     SpO2: 100% 100% 96% 98%     General:  Moderately built and poorly nourished.  Eyes: Anicteric no pallor.  ENT: No discharge from the ears eyes nose or mouth.  Neck: No JVD appreciated. No mass felt.  Cardiovascular: S1 and S2 heard.  Respiratory: No rhonchi or crepitations.  Abdomen: Soft nontender bowel sounds present.  Skin: No rash.  Musculoskeletal: No edema.  Psychiatric: Appears normal.  Neurologic: Alert awake oriented to time place and person. Moves all extremities 5 x 5. No facial asymmetry. Tongue is midline. PERRLA positive.  Labs on Admission:  Basic Metabolic Panel:  Recent Labs Lab 03/14/15 1905  NA 130*  K 4.1  CL 99*  CO2 28  GLUCOSE 112*  BUN 19  CREATININE 1.81*  CALCIUM 8.8*   Liver Function Tests:  Recent Labs Lab 03/14/15 1905  AST 28  ALT 14  ALKPHOS 60  BILITOT 0.6  PROT 6.2*  ALBUMIN 3.3*    Recent Labs Lab 03/14/15 1905  LIPASE 54*   No results for input(s): AMMONIA in the last 168 hours. CBC:  Recent Labs Lab 03/14/15 1905  WBC 1.5*  NEUTROABS 1.1*  HGB 10.1*  HCT 29.3*  MCV 94.8  PLT 90*   Cardiac Enzymes:  Recent  Labs Lab 03/14/15 1905  TROPONINI <0.03    BNP (last 3 results)  Recent Labs  03/14/15 1905  BNP 23.4    ProBNP (last 3 results) No results for input(s): PROBNP in the last 8760 hours.  CBG:  Recent Labs Lab 03/14/15 1832  GLUCAP 99    Radiological Exams on Admission: Ct Head Wo Contrast  03/14/2015   CLINICAL DATA:  [place] backwards today.  Loss of consciousness.  EXAM: CT HEAD WITHOUT CONTRAST  TECHNIQUE: Contiguous axial images were obtained from the base of the skull through the vertex without intravenous contrast.  COMPARISON:  09/12/2010.  FINDINGS: Diffusely enlarged ventricles and subarachnoid spaces. No skull fracture, intracranial hemorrhage or paranasal sinus  air-fluid levels.  IMPRESSION: Mild atrophy.  No acute abnormality.   Electronically Signed   By: Claudie Revering M.D.   On: 03/14/2015 19:32   Ct Chest Wo Contrast  03/14/2015   CLINICAL DATA:  Multiple episodes of syncope today. Dizziness when changing from sitting to standing. Right-sided chest pain and shortness of breath. Falls.  EXAM: CT CHEST WITHOUT CONTRAST  TECHNIQUE: Multidetector CT imaging of the chest was performed following the standard protocol without IV contrast.  COMPARISON:  None.  FINDINGS: Normal heart size. Calcification in the Coronary arteries, mitral valve annulus, and aortic valve as well as in the aorta. Small to moderate pericardial effusion. Normal caliber thoracic aorta. Scattered mediastinal lymph nodes are not pathologically enlarged. Esophagus is decompressed.  1.8 cm diameter nodule in the right lower lung. This is indeterminate and primary or metastatic disease needs to be excluded. No other focal lung lesions. No focal consolidation or airspace disease. No pneumothorax. No pleural effusions.  Left breast reconstruction. Prominent adrenal gland nodules bilaterally, right measuring 4.8 x 1.9 cm and left measuring 2.4 x 4.7 cm. Spleen is enlarged but appears homogeneous. Multiple  low-attenuation lesions in the liver are likely represent cysts but given the presence of other possible metastatic lesions, further evaluation with contrast-enhanced CT or MRI is suggested. Degenerative changes in the spine. No destructive bone lesions appreciated.  IMPRESSION: 1.8 cm diameter right lower lung nodule and bilateral adrenal gland nodules suspicious for metastatic disease. Multiple low-attenuation lesions in the liver are probably cysts but further characterization is suggested with either contrast-enhanced CT or MRI. Small to moderate pericardial effusion. Mild splenic enlargement.   Electronically Signed   By: Lucienne Capers M.D.   On: 03/14/2015 22:07   Dg Chest Port 1 View  03/14/2015   CLINICAL DATA:  Status post fall.  History of breast cancer.  EXAM: PORTABLE CHEST - 1 VIEW  COMPARISON:  June 23, 2012  FINDINGS: The heart size and mediastinal contours are stable. There is no focal infiltrate, pulmonary edema, or pleural effusion. Left axillary surgical clips are identified unchanged. The visualized skeletal structures are stable.  IMPRESSION: No active cardiopulmonary disease.   Electronically Signed   By: Abelardo Diesel M.D.   On: 03/14/2015 18:45    EKG: Independently reviewed. Normal sinus rhythm with low voltage. QTC is 473 ms.  Assessment/Plan Active Problems:   MGUS (monoclonal gammopathy of unknown significance)   Syncope   Hypotension   ARF (acute renal failure)   Chronic leukopenia   Thrombocytopenia   Lung nodule   Adrenal nodule   1. Syncope - patient was clearly hypotensive which could cause patient's syncope. Continue with hydration patient has already received 2 L normal saline bolus and I will continue with normal saline infusion and hold off patient's antihypertensives. Hypotension could be from poor oral intake with dehydration and patient in addition taking antihypertensives. Patient does not look septic. Patient's d-dimer is significantly elevated and  if patient's creatinine improved with hydration and get CT angiogram of the chest to rule out PE but if does not improve then will get VQ scan. I'm hesitant to start heparin because of patient's low platelet counts and patient is personally not hypoxic. Patient's CT scan also shows small to moderate pericardial effusion for which I have ordered 2-D echo. Recheck orthostatics in a.m. after hydration. Blood cultures obtained in the ER. 2. Acute renal failure - probably from hypotension dehydration due to poor oral intake with added on patient in addition using  medications including ACE inhibitor, spironolactone, HCTZ and NSAIDs and propranolol. I have discontinued all those medications and continue to hydrate. UA and FENa are pending. Closely follow intake output and metabolic panel. 3. Lung nodule with bilateral adrenal nodules and splenomegaly and concerning for metastatic process - patient will require CT scan or MRI with contrast. Will order these if patient's creatinine improves.  4. Chronic leukopenia thrombocytopenia and anemia - being followed by Dr. Marin Olp oncologist. 5. History of MGUS - being followed by oncologist. 6. History of breast cancer status post left-sided mastectomy - being followed by oncologist.  CT scan also showed cystic lesion in the liver. Will follow CT abdomen and pelvis with contrast should be done if creatinine improves with hydration.  I have reviewed patient's old charts on labs. Personally reviewed patient's chest x-ray and EKG.  DVT Prophylaxis SCDs for now until we make sure that patient does not have significant pericardial effusion with 2-D echo.  Code Status: Full code.  Family Communication: Discussed with patient's daughter.  Disposition Plan: Admit for observation.    Dredyn Gubbels N. [facility] Pager [phone].  If 7PM-7AM, please contact night-coverage [email or web address] Password [email or web address] 03/14/2015, 11:14 PM

## 2015-03-14 NOTE — ED Notes (Signed)
Returned from CT.

## 2015-03-14 NOTE — ED Notes (Signed)
Patient soiled herself.  Stool present on patient and sheets , gown and linen changed.   Patient cleaned.

## 2015-03-14 NOTE — ED Notes (Signed)
Bed: KL50 Expected date:  Expected time:  Means of arrival:  Comments: Ems-syncope

## 2015-03-14 NOTE — ED Notes (Signed)
Per [facility]- Pt resides at home by herself. Witness fall by her daughter. Fell backwards. LOC appox. 1-2 minutes. Complete recovery. Neg STROKE. Denies any complaints. BP 82/48 HR 100 lying supine. 500 [place] NS Bolus. Recovery VSS. SCCA cleared.

## 2015-03-14 NOTE — ED Notes (Signed)
Pt passed out first at 4:30 PM, and again around 5:00pm.  Pt was caught and eased to the floor with the first syncopal episode. Patient fell backwards, and hit her head on the floor with the second syncopal episode.  Patient reports dizziness when changing from sitting to standing starting today in the AM.  Pt states she hasn't eaten much or had any energy times 3 days.  Pt states all she has eaten today was jello after the first time she passed out.

## 2015-03-15 ENCOUNTER — Observation Stay (HOSPITAL_COMMUNITY): Payer: Medicare Other

## 2015-03-15 ENCOUNTER — Encounter (HOSPITAL_COMMUNITY): Payer: Self-pay | Admitting: Radiology

## 2015-03-15 DIAGNOSIS — R911 Solitary pulmonary nodule: Secondary | ICD-10-CM | POA: Diagnosis not present

## 2015-03-15 DIAGNOSIS — Z23 Encounter for immunization: Secondary | ICD-10-CM | POA: Diagnosis not present

## 2015-03-15 DIAGNOSIS — Z85828 Personal history of other malignant neoplasm of skin: Secondary | ICD-10-CM | POA: Diagnosis not present

## 2015-03-15 DIAGNOSIS — I5032 Chronic diastolic (congestive) heart failure: Secondary | ICD-10-CM | POA: Diagnosis present

## 2015-03-15 DIAGNOSIS — I313 Pericardial effusion (noninflammatory): Secondary | ICD-10-CM

## 2015-03-15 DIAGNOSIS — E119 Type 2 diabetes mellitus without complications: Secondary | ICD-10-CM | POA: Diagnosis present

## 2015-03-15 DIAGNOSIS — R918 Other nonspecific abnormal finding of lung field: Secondary | ICD-10-CM | POA: Diagnosis present

## 2015-03-15 DIAGNOSIS — R55 Syncope and collapse: Secondary | ICD-10-CM

## 2015-03-15 DIAGNOSIS — E785 Hyperlipidemia, unspecified: Secondary | ICD-10-CM | POA: Diagnosis present

## 2015-03-15 DIAGNOSIS — D696 Thrombocytopenia, unspecified: Secondary | ICD-10-CM | POA: Diagnosis not present

## 2015-03-15 DIAGNOSIS — Z7982 Long term (current) use of aspirin: Secondary | ICD-10-CM | POA: Diagnosis not present

## 2015-03-15 DIAGNOSIS — D472 Monoclonal gammopathy: Secondary | ICD-10-CM | POA: Diagnosis present

## 2015-03-15 DIAGNOSIS — D72819 Decreased white blood cell count, unspecified: Secondary | ICD-10-CM | POA: Diagnosis not present

## 2015-03-15 DIAGNOSIS — Z87891 Personal history of nicotine dependence: Secondary | ICD-10-CM | POA: Diagnosis not present

## 2015-03-15 DIAGNOSIS — E559 Vitamin D deficiency, unspecified: Secondary | ICD-10-CM | POA: Diagnosis present

## 2015-03-15 DIAGNOSIS — E279 Disorder of adrenal gland, unspecified: Secondary | ICD-10-CM | POA: Diagnosis present

## 2015-03-15 DIAGNOSIS — I951 Orthostatic hypotension: Secondary | ICD-10-CM | POA: Diagnosis present

## 2015-03-15 DIAGNOSIS — I10 Essential (primary) hypertension: Secondary | ICD-10-CM | POA: Diagnosis present

## 2015-03-15 DIAGNOSIS — E440 Moderate protein-calorie malnutrition: Secondary | ICD-10-CM | POA: Diagnosis present

## 2015-03-15 DIAGNOSIS — D61818 Other pancytopenia: Secondary | ICD-10-CM | POA: Diagnosis present

## 2015-03-15 DIAGNOSIS — E860 Dehydration: Secondary | ICD-10-CM | POA: Diagnosis present

## 2015-03-15 DIAGNOSIS — Z853 Personal history of malignant neoplasm of breast: Secondary | ICD-10-CM | POA: Diagnosis not present

## 2015-03-15 DIAGNOSIS — Z6821 Body mass index (BMI) 21.0-21.9, adult: Secondary | ICD-10-CM | POA: Diagnosis not present

## 2015-03-15 DIAGNOSIS — Z9012 Acquired absence of left breast and nipple: Secondary | ICD-10-CM | POA: Diagnosis present

## 2015-03-15 DIAGNOSIS — R161 Splenomegaly, not elsewhere classified: Secondary | ICD-10-CM | POA: Diagnosis present

## 2015-03-15 DIAGNOSIS — M810 Age-related osteoporosis without current pathological fracture: Secondary | ICD-10-CM | POA: Diagnosis present

## 2015-03-15 DIAGNOSIS — Z79899 Other long term (current) drug therapy: Secondary | ICD-10-CM | POA: Diagnosis not present

## 2015-03-15 DIAGNOSIS — N179 Acute kidney failure, unspecified: Secondary | ICD-10-CM | POA: Diagnosis present

## 2015-03-15 DIAGNOSIS — K7689 Other specified diseases of liver: Secondary | ICD-10-CM | POA: Diagnosis present

## 2015-03-15 LAB — BASIC METABOLIC PANEL
Anion gap: 8 (ref 5–15)
Anion gap: 7 (ref 5–15)
BUN: 15 mg/dL (ref 6–20)
BUN: 12 mg/dL (ref 6–20)
CALCIUM: 8 mg/dL — AB (ref 8.9–10.3)
CALCIUM: 7.7 mg/dL — AB (ref 8.9–10.3)
CHLORIDE: 107 mmol/L (ref 101–111)
CO2: 19 mmol/L — ABNORMAL LOW (ref 22–32)
CO2: 19 mmol/L — ABNORMAL LOW (ref 22–32)
CREATININE: 1.41 mg/dL — AB (ref 0.44–1.00)
CREATININE: 1.24 mg/dL — AB (ref 0.44–1.00)
Chloride: 104 mmol/L (ref 101–111)
GFR calc Af Amer: 47 mL/min — ABNORMAL LOW (ref >60)
GFR calc non Af Amer: 35 mL/min — ABNORMAL LOW (ref >60)
GFR, EST AFRICAN AMERICAN: 40 mL/min — AB (ref >60)
GFR, EST NON AFRICAN AMERICAN: 41 mL/min — AB (ref >60)
Glucose, Bld: 100 mg/dL — ABNORMAL HIGH (ref 65–99)
Glucose, Bld: 89 mg/dL (ref 65–99)
Potassium: 4.2 mmol/L (ref 3.5–5.1)
Potassium: 3.7 mmol/L (ref 3.5–5.1)
SODIUM: 134 mmol/L — AB (ref 135–145)
SODIUM: 130 mmol/L — AB (ref 135–145)

## 2015-03-15 LAB — CBC WITH DIFFERENTIAL/PLATELET
BASOS PCT: 1 % (ref 0–1)
Basophils Absolute: 0 10*3/uL (ref 0.0–0.1)
EOS ABS: 0.1 10*3/uL (ref 0.0–0.7)
EOS PCT: 6 % — AB (ref 0–5)
HEMATOCRIT: 27.5 % — AB (ref 36.0–46.0)
Hemoglobin: 9.4 g/dL — ABNORMAL LOW (ref 12.0–15.0)
LYMPHS ABS: 0.1 10*3/uL — AB (ref 0.7–4.0)
Lymphocytes Relative: 11 % — ABNORMAL LOW (ref 12–46)
MCH: 31.6 pg (ref 26.0–34.0)
MCHC: 34.2 g/dL (ref 30.0–36.0)
MCV: 92.6 fL (ref 78.0–100.0)
MONOS PCT: 15 % — AB (ref 3–12)
Monocytes Absolute: 0.2 10*3/uL (ref 0.1–1.0)
NEUTROS PCT: 67 % (ref 43–77)
Neutro Abs: 0.8 10*3/uL — ABNORMAL LOW (ref 1.7–7.7)
PLATELETS: 65 10*3/uL — AB (ref 150–400)
RBC: 2.97 MIL/uL — AB (ref 3.87–5.11)
RDW: 14.4 % (ref 11.5–15.5)
WBC: 1.1 10*3/uL — AB (ref 4.0–10.5)

## 2015-03-15 LAB — COMPREHENSIVE METABOLIC PANEL
ALT: 12 U/L — ABNORMAL LOW (ref 14–54)
ANION GAP: 6 (ref 5–15)
AST: 27 U/L (ref 15–41)
Albumin: 2.7 g/dL — ABNORMAL LOW (ref 3.5–5.0)
Alkaline Phosphatase: 55 U/L (ref 38–126)
BILIRUBIN TOTAL: 0.3 mg/dL (ref 0.3–1.2)
BUN: 15 mg/dL (ref 6–20)
CHLORIDE: 107 mmol/L (ref 101–111)
CO2: 20 mmol/L — ABNORMAL LOW (ref 22–32)
Calcium: 7.9 mg/dL — ABNORMAL LOW (ref 8.9–10.3)
Creatinine, Ser: 1.33 mg/dL — ABNORMAL HIGH (ref 0.44–1.00)
GFR calc Af Amer: 43 mL/min — ABNORMAL LOW (ref >60)
GFR, EST NON AFRICAN AMERICAN: 37 mL/min — AB (ref >60)
Glucose, Bld: 97 mg/dL (ref 65–99)
POTASSIUM: 3.8 mmol/L (ref 3.5–5.1)
Sodium: 133 mmol/L — ABNORMAL LOW (ref 135–145)
TOTAL PROTEIN: 5 g/dL — AB (ref 6.5–8.1)

## 2015-03-15 LAB — TROPONIN I
TROPONIN I: 0.08 ng/mL — AB (ref <0.031)
TROPONIN I: 0.12 ng/mL — AB (ref <0.031)
Troponin I: 0.06 ng/mL — ABNORMAL HIGH (ref <0.031)

## 2015-03-15 LAB — SODIUM, URINE, RANDOM: SODIUM UR: 70 mmol/L

## 2015-03-15 LAB — CREATININE, URINE, RANDOM: CREATININE, URINE: 302.77 mg/dL

## 2015-03-15 MED ORDER — SODIUM CHLORIDE 0.9 % IV BOLUS (SEPSIS)
1000.0000 mL | Freq: Once | INTRAVENOUS | Status: AC
  Administered 2015-03-15: 1000 mL via INTRAVENOUS

## 2015-03-15 MED ORDER — SODIUM CHLORIDE 0.9 % IV SOLN
INTRAVENOUS | Status: DC
  Administered 2015-03-16 – 2015-03-17 (×3): via INTRAVENOUS

## 2015-03-15 MED ORDER — SODIUM CHLORIDE 0.9 % IV BOLUS (SEPSIS): 500.0000 mL | Freq: Once | INTRAVENOUS | Status: DC

## 2015-03-15 MED ORDER — IOHEXOL 350 MG/ML SOLN
100.0000 mL | Freq: Once | INTRAVENOUS | Status: AC | PRN
  Administered 2015-03-15: 100 mL via INTRAVENOUS

## 2015-03-15 NOTE — Progress Notes (Signed)
Utilization review completed.  

## 2015-03-15 NOTE — Progress Notes (Signed)
VASCULAR LAB PRELIMINARY  PRELIMINARY  PRELIMINARY  PRELIMINARY  Bilateral lower extremity venous duplex completed.    Preliminary report:  Bilateral:  No evidence of DVT, superficial thrombosis, or Baker's Cyst.   Kearia Yin, RVS 03/15/2015, 3:37 PM

## 2015-03-15 NOTE — Progress Notes (Signed)
  Echocardiogram 2D Echocardiogram has been performed.  [name] [name], Tony 03/15/2015, 10:04 AM 

## 2015-03-15 NOTE — Care Management Note (Signed)
Case Management Note  Patient Details  Name: [name] [name] MRN: [id number] Date of Birth: [birth year]  Subjective/Objective: 80 y/o f admitted w/syncope. From home.PT cons placed.                   Action/Plan:d/c plan home.   Expected Discharge Date:                 Expected Discharge Plan:  Home/Self Care  In-House Referral:     Discharge planning Services  CM Consult  Post Acute Care Choice:    Choice offered to:     DME Arranged:    DME Agency:     HH Arranged:    HH Agency:     Status of Service:  In process, will continue to follow  Medicare Important Message Given:    Date Medicare IM Given:    Medicare IM give by:    Date Additional Medicare IM Given:    Additional Medicare Important Message give by:     If discussed at [place] of Stay Meetings, dates discussed:    Additional Comments:  Dessa Phi, RN 03/15/2015, 12:50 PM

## 2015-03-15 NOTE — Progress Notes (Signed)
   Follow Up Note  Pt admitted earlier this morning.  Seen after arrived to floor.  She is currently feeling better. Although still weak  Cardiovascular: Regular rate and rhythm, S1 and S2 Lungs: Clear to auscultation bilaterally Abd: Soft, nontender, nondistended, positive bowel sounds Ext: no clubbing or cyanosis or edema   Present on Admission:  . Syncope: Most likely from orthostasis brought on by diuretic. hydrating.  [name] Hypotension: As above.  . ARF (acute renal failure): As above. Improving.  [name] MGUS (monoclonal gammopathy of unknown significance): Stable  . Chronic leukopenia: Noted  . Thrombocytopenia . Lung nodule: Check CT in the morning  . Adrenal nodule  elevated troponins: Unclear etiology. May be from renal failure versus diastolic heart failure. No signs of myocardial infarction. Recheck in the morning Elevated d-dimer: Unclear etiology. Markedly elevated. Checking lower extremity Dopplers

## 2015-03-16 DIAGNOSIS — R918 Other nonspecific abnormal finding of lung field: Secondary | ICD-10-CM

## 2015-03-16 DIAGNOSIS — I951 Orthostatic hypotension: Secondary | ICD-10-CM | POA: Insufficient documentation

## 2015-03-16 DIAGNOSIS — D72819 Decreased white blood cell count, unspecified: Secondary | ICD-10-CM

## 2015-03-16 DIAGNOSIS — E440 Moderate protein-calorie malnutrition: Secondary | ICD-10-CM

## 2015-03-16 DIAGNOSIS — D472 Monoclonal gammopathy: Secondary | ICD-10-CM

## 2015-03-16 DIAGNOSIS — R55 Syncope and collapse: Secondary | ICD-10-CM | POA: Insufficient documentation

## 2015-03-16 DIAGNOSIS — I5032 Chronic diastolic (congestive) heart failure: Secondary | ICD-10-CM | POA: Insufficient documentation

## 2015-03-16 DIAGNOSIS — D696 Thrombocytopenia, unspecified: Secondary | ICD-10-CM

## 2015-03-16 DIAGNOSIS — N179 Acute kidney failure, unspecified: Secondary | ICD-10-CM | POA: Insufficient documentation

## 2015-03-16 LAB — BASIC METABOLIC PANEL
ANION GAP: 5 (ref 5–15)
BUN: 9 mg/dL (ref 6–20)
CHLORIDE: 106 mmol/L (ref 101–111)
CO2: 20 mmol/L — ABNORMAL LOW (ref 22–32)
Calcium: 7.7 mg/dL — ABNORMAL LOW (ref 8.9–10.3)
Creatinine, Ser: 1.02 mg/dL — ABNORMAL HIGH (ref 0.44–1.00)
GFR calc Af Amer: 60 mL/min — ABNORMAL LOW (ref >60)
GFR, EST NON AFRICAN AMERICAN: 52 mL/min — AB (ref >60)
Glucose, Bld: 92 mg/dL (ref 65–99)
POTASSIUM: 3.7 mmol/L (ref 3.5–5.1)
SODIUM: 131 mmol/L — AB (ref 135–145)

## 2015-03-16 LAB — CBC
HEMATOCRIT: 26 % — AB (ref 36.0–46.0)
HEMOGLOBIN: 9 g/dL — AB (ref 12.0–15.0)
MCH: 32.8 pg (ref 26.0–34.0)
MCHC: 34.6 g/dL (ref 30.0–36.0)
MCV: 94.9 fL (ref 78.0–100.0)
Platelets: 56 10*3/uL — ABNORMAL LOW (ref 150–400)
RBC: 2.74 MIL/uL — AB (ref 3.87–5.11)
RDW: 14.7 % (ref 11.5–15.5)
WBC: 1.2 10*3/uL — AB (ref 4.0–10.5)

## 2015-03-16 LAB — TROPONIN I: Troponin I: 0.03 ng/mL (ref <0.031)

## 2015-03-16 MED ORDER — VITAMINS A & D EX OINT
TOPICAL_OINTMENT | CUTANEOUS | Status: AC
  Filled 2015-03-16: qty 5

## 2015-03-16 NOTE — Discharge Summary (Signed)
Discharge Summary  [name] [name] OTL:[id number] DOB: Dec 16, 1934  PCP: Jerlyn Ly, MD  Admit date: 03/14/2015 Discharge date: 03/16/2015  Time spent: 40 minutes  Recommendations for Outpatient Follow-up:  1. Patient will have CT-guided biopsy of pulmonary nodule early next week. Interventional radiology will call to schedule this. 2. Patient will follow up with her PCP in the next 2-3 weeks 3. Patient is advised to hold her blood pressure medications: ARB, diuretic, beta blocker until she follows up with her PCP 4. Patient will go home with home health PT  Discharge Diagnoses:  Active Hospital Problems   Diagnosis Date Noted  . Syncope 03/14/2015  . Hypotension 03/14/2015  . ARF (acute renal failure) 03/14/2015  . Chronic leukopenia 03/14/2015  . Thrombocytopenia 03/14/2015  . Lung nodule 03/14/2015  . Adrenal nodule 03/14/2015  . MGUS (monoclonal gammopathy of unknown significance) 03/16/2013    Resolved Hospital Problems   Diagnosis Date Noted Date Resolved  No resolved problems to display.    Discharge Condition: Improved, being discharged home  Diet recommendation: Low-sodium  Filed Weights   03/14/15 2337 03/15/15 0300 03/16/15 0500  Weight: 63.73 kg (140 lb 8 oz) 63.73 kg (140 lb 8 oz) 66.9 kg (147 lb 7.8 oz)    History of present illness:  80 year old female with past medical history of hypertension and mild pancytopenia plus distant history of breast cancer admitted on evening of 8/25 after a syncopal event. Patient had been not feeling well for the past few weeks and had very poor by mouth intake. In the emergency room, she was noted to be volume depleted requiring several liters of normal saline. She was noted to have acute renal failure with a creatinine of 1.81 (baseline near normal)  Hospital Course:  Active Problems:   MGUS (monoclonal gammopathy of unknown significance)   Syncope caused by orthostatic hypotension brought on by poor by mouth intake plus  continued blood pressure medications. Patient's blood pressure responded nicely to IV fluids. Systolic Blood pressure on discharge was at 109 on fluids. Have recommended patient hold her antihypertensives medications until she follows up with her PCP. If some of her poor by mouth intake is from background malignancy, she likely may significant decrease in dose or to stop them altogether.  Echocardiogram noted mild pericardial effusion plus diastolic dysfunction, but nothing significant. Patient seen by physical therapy who recommended home health PT which we are setting up.   Hypotension   ARF (acute renal failure): Secondary to orthostatic hypotension. With IV fluids, creatinine down to 1.02 by day of discharge.  Abnormal troponins: Minimally elevated, however no higher than 0.12 and by day of discharge down to 0.03. No signs of any cardiac events. No chest pain. EKG unrevealing. Echocardiogram unrevealing in the setting of acute renal failure, likely etiology    Chronic leukopenia: Remained stable   Thrombocytopenia: Remained stable   Lung nodule/adrenal nodule/liver lesions: During workup, patient underwent CT scan of chest noting multiple pulmonary nodules as well as lesion seen in the liver and adrenal glands. This is certainly concerning for metastatic disease. Discussed with pulmonary and pulmonary nodules are not in locations amenable to bronchoscopy. Nodules are not in anyway affecting patient's respiratory status and she otherwise looks stable. Since interventional radiology unable to get biopsy any earlier than 8/29 if not later, patient felt to be stable to be discharged home. Discussed case with Dr. Burr Medico, on call for oncology who will see patient prior to discharge.  Chronic diastolic heart failure: Incidentally noted on echocardiogram.  Procedures:  None  Consultations:  Oncology  Discharge Exam: BP 109/42 mmHg  Pulse 75  Temp(Src) 99.6 F (37.6 C) (Oral)  Resp 20  Ht 5\' 8"   (1.727 m)  Wt 66.9 kg (147 lb 7.8 oz)  BMI 22.43 kg/m2  SpO2 97%  General: Alert and oriented 3, no acute distress Cardiovascular: Regular rate and rhythm, S1-S2 Respiratory: Clear to auscultation bilaterally  Discharge Instructions You were cared for by a hospitalist during your hospital stay. If you have any questions about your discharge medications or the care you received while you were in the hospital after you are discharged, you can call the unit and asked to speak with the hospitalist on call if the hospitalist that took care of you is not available. Once you are discharged, your primary care physician will handle any further medical issues. Please note that NO REFILLS for any discharge medications will be authorized once you are discharged, as it is imperative that you return to your primary care physician (or establish a relationship with a primary care physician if you do not have one) for your aftercare needs so that they can reassess your need for medications and monitor your lab values.  Discharge Instructions    Diet - low sodium heart healthy    Complete by:  As directed      Increase activity slowly    Complete by:  As directed             Medication List    STOP taking these medications        benazepril 20 MG tablet  Commonly known as:  LOTENSIN     propranolol ER 80 MG 24 hr capsule  Commonly known as:  INDERAL LA     spironolactone-hydrochlorothiazide 25-25 MG per tablet  Commonly known as:  ALDACTAZIDE      TAKE these medications        aspirin EC 81 MG tablet  Take 81 mg by mouth once a week.     escitalopram 10 MG tablet  Commonly known as:  LEXAPRO  Take 5 mg by mouth every morning.     fish oil-omega-3 fatty acids 1000 MG capsule  Take 2 g by mouth every morning.     ibuprofen 200 MG tablet  Commonly known as:  ADVIL,MOTRIN  Take 400 mg by mouth every 6 (six) hours as needed. For back pain.     methocarbamol 500 MG tablet  Commonly known  as:  ROBAXIN  Take 500 mg by mouth every 8 (eight) hours as needed for muscle spasms.     multivitamin capsule  Take 1 capsule by mouth every morning.     raloxifene 60 MG tablet  Commonly known as:  EVISTA  Take 60 mg by mouth every morning.     rosuvastatin 20 MG tablet  Commonly known as:  CRESTOR  Take 10 mg by mouth every morning.     Vitamin D (Ergocalciferol) 50000 UNITS Caps capsule  Commonly known as:  DRISDOL  Take 50,000 Units by mouth every Monday.       No Known Allergies     Follow-up Information    Follow up with [facility] interventional radiology.   Why:  They will call you for date and time of your biopsy appointment   Contact information:   [place] [phone]       The results of significant diagnostics from this hospitalization (including imaging, microbiology, ancillary  and laboratory) are listed below for reference.    Significant Diagnostic Studies: Ct Head Wo Contrast  03/14/2015   CLINICAL DATA:  [place] backwards today.  Loss of consciousness.  EXAM: CT HEAD WITHOUT CONTRAST  TECHNIQUE: Contiguous axial images were obtained from the base of the skull through the vertex without intravenous contrast.  COMPARISON:  09/12/2010.  FINDINGS: Diffusely enlarged ventricles and subarachnoid spaces. No skull fracture, intracranial hemorrhage or paranasal sinus air-fluid levels.  IMPRESSION: Mild atrophy.  No acute abnormality.   Electronically Signed   By: Claudie Revering M.D.   On: 03/14/2015 19:32   Ct Chest Wo Contrast  03/14/2015   CLINICAL DATA:  Multiple episodes of syncope today. Dizziness when changing from sitting to standing. Right-sided chest pain and shortness of breath. Falls.  EXAM: CT CHEST WITHOUT CONTRAST  TECHNIQUE: Multidetector CT imaging of the chest was performed following the standard protocol without IV contrast.  COMPARISON:  None.  FINDINGS: Normal heart size. Calcification in the Coronary  arteries, mitral valve annulus, and aortic valve as well as in the aorta. Small to moderate pericardial effusion. Normal caliber thoracic aorta. Scattered mediastinal lymph nodes are not pathologically enlarged. Esophagus is decompressed.  1.8 cm diameter nodule in the right lower lung. This is indeterminate and primary or metastatic disease needs to be excluded. No other focal lung lesions. No focal consolidation or airspace disease. No pneumothorax. No pleural effusions.  Left breast reconstruction. Prominent adrenal gland nodules bilaterally, right measuring 4.8 x 1.9 cm and left measuring 2.4 x 4.7 cm. Spleen is enlarged but appears homogeneous. Multiple low-attenuation lesions in the liver are likely represent cysts but given the presence of other possible metastatic lesions, further evaluation with contrast-enhanced CT or MRI is suggested. Degenerative changes in the spine. No destructive bone lesions appreciated.  IMPRESSION: 1.8 cm diameter right lower lung nodule and bilateral adrenal gland nodules suspicious for metastatic disease. Multiple low-attenuation lesions in the liver are probably cysts but further characterization is suggested with either contrast-enhanced CT or MRI. Small to moderate pericardial effusion. Mild splenic enlargement.   Electronically Signed   By: Lucienne Capers M.D.   On: 03/14/2015 22:07   Ct Angio Chest Pe W/cm &/or Wo Cm  03/15/2015   CLINICAL DATA:  History of breast carcinoma with recent syncopal episode and back pain  EXAM: CT ANGIOGRAPHY CHEST WITH CONTRAST  TECHNIQUE: Multidetector CT imaging of the chest was performed using the standard protocol during bolus administration of intravenous contrast. Multiplanar CT image reconstructions and MIPs were obtained to evaluate the vascular anatomy.  CONTRAST:  156mL OMNIPAQUE IOHEXOL 350 MG/ML SOLN  COMPARISON:  None.  FINDINGS: The lungs are well aerated bilaterally with small left-sided pleural effusion and left basilar  atelectasis. Single calcified granuloma is noted in the left lung. Scattered pulmonary nodules are identified throughout the lungs bilaterally. The largest of these lies in the right lower lobe best seen on image number 66 of series 8 measuring 19 mm in greatest dimension. A 6-7 mm nodule is noted in the left lower lobe best seen on image number 54 of series 8 as well as a tiny 2-3 mm nodule in the left apex best seen on image number 13 of series 8. Given the patient's clinical history these are consistent with metastatic foci.  The thoracic inlet is within normal limits. The thoracic aorta shows mild calcifications without aneurysmal dilatation or dissection. A pericardial effusion is noted. This measures approximately 2.3 cm in greatest thickness posteriorly. Mild coronary  calcifications are noted. The pulmonary artery is well visualized and demonstrates a normal branching pattern. No filling defects to suggest pulmonary emboli are identified. No hilar or mediastinal adenopathy is noted. Some thickening of the midesophagus is noted just above the level of the carina best seen on image number 97 of series 7.  Changes consistent with breast implant are noted on the left. This is consistent with the patient's given clinical history of carcinoma of the breast. Postsurgical changes in the axilla are seen. No axillary adenopathy is identified. The upper abdomen again demonstrates hypodensities within the liver similar to that seen on prior CT. Adrenal lesions are again noted bilaterally.  Review of the MIP images confirms the above findings.  IMPRESSION: Changes consistent with metastatic disease within the adrenal glands bilaterally as well as the lungs bilaterally.  Multiple hypodensities are again seen within liver which may represent metastatic disease. Further evaluation is recommended.  No evidence of pulmonary embolism.  Pericardial effusion.  Thickening of the midesophagus of uncertain significance.    Electronically Signed   By: Inez Catalina M.D.   On: 03/15/2015 10:05   Dg Chest Port 1 View  03/14/2015   CLINICAL DATA:  Status post fall.  History of breast cancer.  EXAM: PORTABLE CHEST - 1 VIEW  COMPARISON:  June 23, 2012  FINDINGS: The heart size and mediastinal contours are stable. There is no focal infiltrate, pulmonary edema, or pleural effusion. Left axillary surgical clips are identified unchanged. The visualized skeletal structures are stable.  IMPRESSION: No active cardiopulmonary disease.   Electronically Signed   By: Abelardo Diesel M.D.   On: 03/14/2015 18:45    Microbiology: Recent Results (from the past 240 hour(s))  Culture, blood (routine x 2)     Status: None (Preliminary result)   Collection Time: 03/14/15  7:00 PM  Result Value Ref Range Status   Specimen Description BLOOD RIGHT HAND  Final   Special Requests BOTTLES DRAWN AEROBIC ONLY 7ML  Final   Culture   Final    NO GROWTH < 24 HOURS Performed at [facility]    Report Status PENDING  Incomplete  Culture, blood (routine x 2)     Status: None (Preliminary result)   Collection Time: 03/14/15  7:05 PM  Result Value Ref Range Status   Specimen Description BLOOD RIGHT ANTECUBITAL  Final   Special Requests BOTTLES DRAWN AEROBIC AND ANAEROBIC 5ML  Final   Culture   Final    NO GROWTH < 24 HOURS Performed at [facility]    Report Status PENDING  Incomplete     Labs: Basic Metabolic Panel:  Recent Labs Lab 03/14/15 1905 03/15/15 0435 03/15/15 0605 03/15/15 1200 03/16/15 0505  NA 130* 134* 133* 130* 131*  K 4.1 4.2 3.8 3.7 3.7  CL 99* 107 107 104 106  CO2 28 19* 20* 19* 20*  GLUCOSE 112* 100* 97 89 92  BUN 19 15 15 12 9   CREATININE 1.81* 1.41* 1.33* 1.24* 1.02*  CALCIUM 8.8* 8.0* 7.9* 7.7* 7.7*   Liver Function Tests:  Recent Labs Lab 03/14/15 1905 03/15/15 0605  AST 28 27  ALT 14 12*  ALKPHOS 60 55  BILITOT 0.6 0.3  PROT 6.2* 5.0*  ALBUMIN 3.3* 2.7*    Recent Labs Lab  03/14/15 1905  LIPASE 54*   No results for input(s): AMMONIA in the last 168 hours. CBC:  Recent Labs Lab 03/14/15 1905 03/15/15 0605 03/16/15 0505  WBC 1.5* 1.1* 1.2*  NEUTROABS 1.1* 0.8*  --   HGB 10.1* 9.4* 9.0*  HCT 29.3* 27.5* 26.0*  MCV 94.8 92.6 94.9  PLT 90* 65* 56*   Cardiac Enzymes:  Recent Labs Lab 03/14/15 2340 03/15/15 0605 03/15/15 1140 03/15/15 2055 03/16/15 0505  TROPONINI <0.03 0.08* 0.12* 0.06* 0.03   BNP: BNP (last 3 results)  Recent Labs  03/14/15 1905  BNP 23.4    ProBNP (last 3 results) No results for input(s): PROBNP in the last 8760 hours.  CBG:  Recent Labs Lab 03/14/15 1832  GLUCAP 99       Signed:  Henretter Piekarski K  [facility] 03/16/2015, 1:45 PM

## 2015-03-16 NOTE — Consult Note (Signed)
[place]  Telephone:([phone]) [phone] Fax:(336) [phone]  Inpatient New Consult Note   Patient Care Team: Crist Infante, MD as PCP - General (Internal Medicine) 03/16/2015   Referring physician: Dr. Maryland Pink   CHIEF COMPLAINTS/PURPOSE OF CONSULTATION:  Possible metastatic cancer   HISTORY OF PRESENTING ILLNESS:  [name] [name] 80 y.o. female  with past medical history of MGUS and pancytopenia, remote history of breast cancer, who was admitted to the hospital for syncope secondary to dehydration. I was called to see the patient to evaluate her abnormal CT scan findings, which are concerning for metastatic cancer.  She has been under my partner Dr. Antonieta Pert care for leukopenia and thrombocytopenia and IgM MGUS for several years. She did have a bone marrow biopsy in 2013, which was unremarkable, and a plasma cell was 3%. She also had breast cancer in 1988, status post mastectomy, and did not have any adjuvant therapy.  She presented with worsening fatigue, low appetite, and 15-20 pound weight loss in the past few months. She has not been eating well, frequently skips a meal due to the low appetite. She had syncope episodes twice at home 2 days ago, and was admitted to [facility]. She did feel better after IV hydration. She has low back pain, intermittent dry cough, no abdominal discomfort or nausea.    MEDICAL HISTORY:  Past Medical History  Diagnosis Date  . Abnormal WBC count     low, h/o  . Other and unspecified hyperlipidemia   . Unspecified essential hypertension   . OP (osteoporosis)   . Vitamin D deficiency   . MGUS (monoclonal gammopathy of unknown significance) 03/16/2013  . Back pain   . Breast cancer   . DM type 2 (diabetes mellitus, type 2)     Pt states she is 'prediabetic'    SURGICAL HISTORY: Past Surgical History  Procedure Laterality Date  . Mastectomy      left  . Breast biopsy      right  . Breast reconstruction    . Skin cancer  removal      basal cell skin cancer  . Wisdom tooth extraction    . Tooth extraction      SOCIAL HISTORY: Social History   Social History  . Marital Status: Widowed    Spouse Name: N/A  . Number of Children: 2  . Years of Education: N/A   Occupational History  . Not on file.   Social History Main Topics  . Smoking status: Former Smoker -- 1.00 packs/day for 10 years    Types: Cigarettes    Start date: 05/15/1956    Quit date: 07/05/1966  . Smokeless tobacco: Never Used     Comment: quit 45 years ago  . Alcohol Use: No  . Drug Use: No  . Sexual Activity: No   Other Topics Concern  . Not on file   Social History Narrative    FAMILY HISTORY: Family History  Problem Relation Age of Onset  . Liver cancer Father   . Diabetes Father     ALLERGIES:  has No Known Allergies.  MEDICATIONS:  Current Facility-Administered Medications  Medication Dose Route Frequency Provider Last Rate Last Dose  . 0.9 %  sodium chloride infusion   Intravenous Continuous Annita Brod, MD 100 mL/hr at 03/16/15 0026    . acetaminophen (TYLENOL) tablet 650 mg  650 mg Oral Q6H PRN Rise Patience, MD       Or  . acetaminophen (TYLENOL) suppository  650 mg  650 mg Rectal Q6H PRN Rise Patience, MD      . aspirin EC tablet 81 mg  81 mg Oral Weekly Rise Patience, MD   81 mg at 03/15/15 1026  . escitalopram (LEXAPRO) tablet 5 mg  5 mg Oral q morning - 10a Rise Patience, MD   5 mg at 03/16/15 1014  . methocarbamol (ROBAXIN) tablet 500 mg  500 mg Oral Q8H PRN Rise Patience, MD      . omega-3 acid ethyl esters (LOVAZA) capsule 1 g  1 g Oral q morning - 10a Rise Patience, MD   1 g at 03/16/15 1014  . ondansetron (ZOFRAN) tablet 4 mg  4 mg Oral Q6H PRN Rise Patience, MD       Or  . ondansetron [facility]) injection 4 mg  4 mg Intravenous Q6H PRN Rise Patience, MD      . rosuvastatin (CRESTOR) tablet 10 mg  10 mg Oral q morning - 10a Rise Patience, MD    10 mg at 03/15/15 1726    REVIEW OF SYSTEMS:   Constitutional: Denies fevers, chills or abnormal night sweats, (+) fatigue and weight loss  Eyes: Denies blurriness of vision, double vision or watery eyes Ears, nose, mouth, throat, and face: Denies mucositis or sore throat Respiratory: Denies cough, dyspnea or wheezes Cardiovascular: Denies palpitation, chest discomfort or lower extremity swelling Gastrointestinal:  Denies nausea, heartburn or change in bowel habits Skin: Denies abnormal skin rashes Lymphatics: Denies new lymphadenopathy or easy bruising Neurological:Denies numbness, tingling or new weaknesses Behavioral/Psych: Mood is stable, no new changes  All other systems were reviewed with the patient and are negative.  PHYSICAL EXAMINATION: ECOG PERFORMANCE STATUS: 3 - Symptomatic, >50% confined to bed  Filed Vitals:   03/16/15 0500  BP: 109/42  Pulse: 75  Temp: 99.6 F (37.6 C)  Resp: 20   Filed Weights   03/14/15 2337 03/15/15 0300 03/16/15 0500  Weight: 140 lb 8 oz (63.73 kg) 140 lb 8 oz (63.73 kg) 147 lb 7.8 oz (66.9 kg)    GENERAL:alert, no distress and comfortable SKIN: skin color, texture, turgor are normal, no rashes or significant lesions EYES: normal, conjunctiva are pink and non-injected, sclera clear OROPHARYNX:no exudate, no erythema and lips, buccal mucosa, and tongue normal  NECK: supple, thyroid normal size, non-tender, without nodularity LYMPH:  no palpable lymphadenopathy in the cervical, axillary or inguinal LUNGS: clear to auscultation and percussion with normal breathing effort HEART: regular rate & rhythm and no murmurs and no lower extremity edema ABDOMEN:abdomen soft, non-tender and normal bowel sounds Musculoskeletal:no cyanosis of digits and no clubbing  PSYCH: alert & oriented x 3 with fluent speech NEURO: no focal motor/sensory deficits  LABORATORY DATA:  I have reviewed the data as listed CBC Latest Ref Rng 03/16/2015 03/15/2015  03/14/2015  WBC 4.0 - 10.5 K/uL 1.2(LL) 1.1(LL) 1.5(L)  Hemoglobin 12.0 - 15.0 g/dL 9.0(L) 9.4(L) 10.1(L)  Hematocrit 36.0 - 46.0 % 26.0(L) 27.5(L) 29.3(L)  Platelets 150 - 400 K/uL 56(L) 65(L) 90(L)      Recent Labs  10/08/14 1321 03/14/15 1905  03/15/15 0605 03/15/15 1200 03/16/15 0505  NA 137 130*  < > 133* 130* 131*  K 3.3 4.1  < > 3.8 3.7 3.7  CL 98 99*  < > 107 104 106  CO2 31 28  < > 20* 19* 20*  GLUCOSE 98 112*  < > 97 89 92  BUN 5* 19  < >  15 12 9   CREATININE 1.0 1.81*  < > 1.33* 1.24* 1.02*  CALCIUM 9.4 8.8*  < > 7.9* 7.7* 7.7*  GFRNONAA  --  26*  < > 37* 41* 52*  GFRAA  --  30*  < > 43* 47* 60*  PROT 6.9 6.2*  --  5.0*  --   --   ALBUMIN  --  3.3*  --  2.7*  --   --   AST 29 28  --  27  --   --   ALT 15 14  --  12*  --   --   ALKPHOS 71 60  --  55  --   --   BILITOT 0.80 0.6  --  0.3  --   --   < > = values in this interval not displayed.   Diagnosis 07/05/2012  Bone Marrow, Aspirate,Biopsy, and Clot, left PIC - HYPERCELLULAR BONE MARROW FOR AGE WITH TRILINEAGE HEMATOPOIESIS AND 3% PLASMA CELLS. - SEE COMMENT PERIPHERAL BLOOD: - LEUKOPENIA AND THROMBOCYTOPENIA Diagnosis Note The bone marrow is hypercellular for age but with trilineage hematopoiesis and essentially orderly and progressive maturation of all cell types. Significant dyspoiesis is not present. The plasma cells represent 3% of all cells with lack of large aggregates or sheets. Immunohistochemical stains show that the plasma cells display kappa light chain excess. The findings are most consistent with plasma cell dyscrasia. Clinical correlation is recommended.   RADIOGRAPHIC STUDIES:  I have personally reviewed the radiological images as listed and agreed with the findings in the report.  Ct Head Wo Contrast 03/14/2015    IMPRESSION: Mild atrophy.  No acute abnormality.   Electronically Signed   By: Claudie Revering M.D.   On: 03/14/2015 19:32   Ct Chest Wo Contrast 03/14/2015    FINDINGS:  Normal heart size. Calcification in the Coronary arteries, mitral valve annulus, and aortic valve as well as in the aorta. Small to moderate pericardial effusion. Normal caliber thoracic aorta. Scattered mediastinal lymph nodes are not pathologically enlarged. Esophagus is decompressed.  1.8 cm diameter nodule in the right lower lung. This is indeterminate and primary or metastatic disease needs to be excluded. No other focal lung lesions. No focal consolidation or airspace disease. No pneumothorax. No pleural effusions.  Left breast reconstruction. Prominent adrenal gland nodules bilaterally, right measuring 4.8 x 1.9 cm and left measuring 2.4 x 4.7 cm. Spleen is enlarged but appears homogeneous. Multiple low-attenuation lesions in the liver are likely represent cysts but given the presence of other possible metastatic lesions, further evaluation with contrast-enhanced CT or MRI is suggested. Degenerative changes in the spine. No destructive bone lesions appreciated.  IMPRESSION: 1.8 cm diameter right lower lung nodule and bilateral adrenal gland nodules suspicious for metastatic disease. Multiple low-attenuation lesions in the liver are probably cysts but further characterization is suggested with either contrast-enhanced CT or MRI. Small to moderate pericardial effusion. Mild splenic enlargement.   Electronically Signed   By: Lucienne Capers M.D.   On: 03/14/2015 22:07   Ct Angio Chest Pe W/cm &/or Wo Cm 03/15/2015    FINDINGS: The lungs are well aerated bilaterally with small left-sided pleural effusion and left basilar atelectasis. Single calcified granuloma is noted in the left lung. Scattered pulmonary nodules are identified throughout the lungs bilaterally. The largest of these lies in the right lower lobe best seen on image number 66 of series 8 measuring 19 mm in greatest dimension. A 6-7 mm nodule is noted in the left lower  lobe best seen on image number 54 of series 8 as well as a tiny 2-3 mm nodule  in the left apex best seen on image number 13 of series 8. Given the patient's clinical history these are consistent with metastatic foci.  The thoracic inlet is within normal limits. The thoracic aorta shows mild calcifications without aneurysmal dilatation or dissection. A pericardial effusion is noted. This measures approximately 2.3 cm in greatest thickness posteriorly. Mild coronary calcifications are noted. The pulmonary artery is well visualized and demonstrates a normal branching pattern. No filling defects to suggest pulmonary emboli are identified. No hilar or mediastinal adenopathy is noted. Some thickening of the midesophagus is noted just above the level of the carina best seen on image number 97 of series 7.  Changes consistent with breast implant are noted on the left. This is consistent with the patient's given clinical history of carcinoma of the breast. Postsurgical changes in the axilla are seen. No axillary adenopathy is identified. The upper abdomen again demonstrates hypodensities within the liver similar to that seen on prior CT. Adrenal lesions are again noted bilaterally.  Review of the MIP images confirms the above findings.  IMPRESSION: Changes consistent with metastatic disease within the adrenal glands bilaterally as well as the lungs bilaterally.  Multiple hypodensities are again seen within liver which may represent metastatic disease. Further evaluation is recommended.  No evidence of pulmonary embolism.  Pericardial effusion.  Thickening of the midesophagus of uncertain significance.   Electronically Signed   By: Inez Catalina M.D.   On: 03/15/2015 10:05     ASSESSMENT & PLAN: 80 year old Caucasian female, with past medical history of chronic leukopenia, thrombocytopenia, IgM MGUS, remote history of breast cancer, who presented with worsening fatigue, anorexia, weight loss, and was admitted to the hospital for syncope and dehydration. She had 10 pack year smoking history, quit a  long time ago.  1. Possible metastatic cancer -I reviewed her CT chest, abdomen and pelvis scan findings. She has a 1.8 cm right lower lobe lung nodule, which is suspicious for primary or metastatic disease. Bilateral adrenal gland mass are also suspicious for metastatic disease. Multiple liver lesions are indeterminate, would be better evaluated by PET or abdominal MRI. -I recommend IR biopsy of the right lower lobe lung nodule, or adrenal gland, for tissue diagnosis.  -Further recommendations will be determined by the final diagnosis. She can certainly follow up with Dr. Marin Olp after the biopsy  2. Worsening leukopenia and thrombocytopenia -This is being chronic issue, worse lately. She did have unremarkable bone marrow biopsy in 2013. However she does have IgM MGUS, which can progress to multiple myeloma and cause anemia and thrombocytopenia. -She may need to repeat a bone marrow biopsy, however this is less urgent, I'll let that Ennever to decide  Recommendations: -IR biopsy of the RLL lung nodule or adrenal gland lesion -abdominal MRI to further evaluate her liver lesions or outpt PET  -Dr. Marin Olp will follow up her on Monday or after discharge, I will inform him.    All questions were answered. The patient knows to call the clinic with any problems, questions or concerns. I spent 40 minutes counseling the patient face to face. The total time spent in the appointment was 55 minutes and more than 50% was on counseling.     Truitt Merle, MD 03/16/2015 1:01 PM

## 2015-03-16 NOTE — Care Management Note (Addendum)
Case Management Note  Patient Details  Name: [name] [name] MRN: [id number] Date of Birth: 10-28-1934  Subjective/Objective:                  Loss of consciousness  Action/Plan: Home with home health PT  Expected Discharge Date:  03/15/15               Expected Discharge Plan:  [place]  In-House Referral:     Discharge planning Services  CM Consult  Post Acute Care Choice:  Home Health Choice offered to:  Patient  DME Arranged:  N/A DME Agency:     HH Arranged:  PT HH Agency:  [facility]  Status of Service:  Completed, signed off  Medicare Important Message Given:    Date Medicare IM Given:    Medicare IM give by:    Date Additional Medicare IM Given:    Additional Medicare Important Message give by:     If discussed at [place] of Stay Meetings, dates discussed:    Additional Comments: CM spoke with patient at the bedside. Patient selects [facility] for  [place]. She has used them in the past. [name] at [facility] notified of the referral. H&P, home care order and face sheet faxed to [employer].  Apolonio Schneiders, RN 03/16/2015, 2:24 PM

## 2015-03-16 NOTE — Evaluation (Signed)
Physical Therapy Evaluation Patient Details Name: [name] [name] MRN: [id number] DOB: 1935-04-30 Today's Date: 03/16/2015   History of Present Illness  [name] [name] is a 80 y.o. female and history of hypertension, chronic anemia thrombocytopenia and leukopenia with history of MGUS being followed by oncologist, previous history of breast cancer was brought to the ER the patient had 2 episodes of syncope. Both episodes happened within 1 hour interval. Both the time patient was walking. Patient states of recently patient has been feeling dizzy when she tries to stand up.  Tests show possible lung nodules with concern for metastatic process.  PMH of breast CA.  Clinical Impression  Pt admitted with above diagnosis. Pt currently with functional limitations due to the deficits listed below (see PT Problem List). Pt will benefit from skilled PT to increase their independence and safety with mobility to allow discharge to the venue listed below.  Pt with unsteady gait while ambulating without AD and recommend RW and initial 24 hour S at home.  However, if pt progresses during acute stay, she may not need this level of S at d/c.  Will continue to assess. PT with low BP throughout session, but no drop and no c/o dizziness. See below comments and doc flowsheets for values.     Follow Up Recommendations Home health PT;Supervision/Assistance - 24 hour    Equipment Recommendations  Rolling walker with 5" wheels    Recommendations for Other Services       Precautions / Restrictions Precautions Precautions: Fall      Mobility  Bed Mobility Overal bed mobility: Modified Independent                Transfers Overall transfer level: Needs assistance Equipment used: None Transfers: Sit to/from Stand Sit to Stand: Min guard         General transfer comment: cues for hand placement.  took several attempts to achieve standing from bed.  Ambulation/Gait Ambulation/Gait assistance: Min guard;Min  assist Ambulation Distance (Feet): 180 Feet Assistive device: None;Rolling walker (2 wheeled) Gait Pattern/deviations: Decreased stride length;Narrow base of support Gait velocity: decreased   General Gait Details: Pt ambulated 5' without AD with definite unsteadiness with narrow BOS and short step length and guarded/stiff gait with MIN A.  Ambulated 120' with RW with improved balance and MIN/guard.  Stairs            Wheelchair Mobility    Modified Rankin (Stroke Patients Only)       Balance Overall balance assessment: Needs assistance   Sitting balance-Leahy Scale: Good       Standing balance-Leahy Scale: Fair                               Pertinent Vitals/Pain Pain Assessment: No/denies pain    Home Living Family/patient expects to be discharged to:: Private residence Living Arrangements: Alone Available Help at Discharge: Family;Friend(s);Available PRN/intermittently Type of Home: House Home Access: Stairs to enter Entrance Stairs-Rails: None Entrance Stairs-Number of Steps: 2-3 Home Layout: One level Home Equipment: None      Prior Function Level of Independence: Independent               Hand Dominance   Dominant Hand: Right    Extremity/Trunk Assessment   Upper Extremity Assessment: Overall WFL for tasks assessed           Lower Extremity Assessment: Generalized weakness;Overall [facility] for tasks assessed  Cervical / Trunk Assessment: Normal  Communication   Communication: No difficulties  Cognition Arousal/Alertness: Awake/alert Behavior During Therapy: WFL for tasks assessed/performed Overall Cognitive Status: Within Functional Limits for tasks assessed                      General Comments General comments (skin integrity, edema, etc.): Orthostatic BP : supine 81/42, sitting 94/39, standing 81/39 with no c/o dizziness or lightheadedness.    Exercises        Assessment/Plan    PT Assessment  Patient needs continued PT services  PT Diagnosis Difficulty walking   PT Problem List Decreased strength;Decreased balance;Decreased mobility;Decreased knowledge of use of DME  PT Treatment Interventions Gait training;Stair training;Functional mobility training;Therapeutic activities;Therapeutic exercise;DME instruction;Balance training   PT Goals (Current goals can be found in the Care Plan section) Acute Rehab PT Goals Patient Stated Goal: to figure out what is going on (scheduled for biopsy) PT Goal Formulation: With patient Time For Goal Achievement: 03/30/15 Potential to Achieve Goals: Good    Frequency Min 3X/week   Barriers to discharge        Co-evaluation               End of Session Equipment Utilized During Treatment: Gait belt Activity Tolerance: Patient tolerated treatment well Patient left: in chair;with call bell/phone within reach;with chair alarm set;with nursing/sitter in room Nurse Communication: Mobility status;Other (comment) (BP)         Time: 1610-9604 PT Time Calculation (min) (ACUTE ONLY): 24 min   Charges:   PT Evaluation $Initial PT Evaluation Tier I: 1 Procedure PT Treatments $Gait Training: 8-22 mins   PT G Codes:        Lugar,Ayaana Biondo LUBECK 03/16/2015, 10:32 AM

## 2015-03-17 ENCOUNTER — Encounter (HOSPITAL_COMMUNITY): Payer: Self-pay | Admitting: Radiology

## 2015-03-17 ENCOUNTER — Inpatient Hospital Stay (HOSPITAL_COMMUNITY): Payer: Medicare Other

## 2015-03-17 DIAGNOSIS — I951 Orthostatic hypotension: Secondary | ICD-10-CM

## 2015-03-17 MED ORDER — IOHEXOL 300 MG/ML  SOLN
50.0000 mL | Freq: Once | INTRAMUSCULAR | Status: AC | PRN
  Administered 2015-03-17: 50 mL via ORAL

## 2015-03-17 MED ORDER — PROPRANOLOL HCL ER 80 MG PO CP24
80.0000 mg | ORAL_CAPSULE | Freq: Every morning | ORAL | Status: DC
  Administered 2015-03-17 – 2015-03-21 (×5): 80 mg via ORAL
  Filled 2015-03-17 (×5): qty 1

## 2015-03-17 MED ORDER — IOHEXOL 300 MG/ML  SOLN
100.0000 mL | Freq: Once | INTRAMUSCULAR | Status: AC | PRN
  Administered 2015-03-17: 100 mL via ORAL

## 2015-03-17 NOTE — Progress Notes (Signed)
PROGRESS NOTE  [name] [name] DDU:[id number] DOB: 10-22-1934 DOA: 03/14/2015 PCP: Jerlyn Ly, MD  HPI/Recap of past 17 hours: 80 year old female with past medical history of hypertension and mild pancytopenia plus distant history of breast cancer admitted on evening of 8/25 after a syncopal event. Patient had been not feeling well for the past few weeks and had very poor by mouth intake. In the emergency room, she was noted to be volume depleted requiring several liters of normal saline. She was noted to have acute renal failure with a creatinine of 1.81 (baseline near normal)  Assessment/Plan:  MGUS (monoclonal gammopathy of unknown significance): Stable.   Syncope caused by orthostatic hypotension brought on by poor by mouth intake plus continued blood pressure medications. Patient's blood pressure responded nicely to IV fluids. Antihypertensives held. Suspect her poor by mouth intake is from background malignancy. Echocardiogram noted mild pericardial effusion plus diastolic dysfunction, but nothing significant. Patient seen by physical therapy who recommended home health PT which has been set up.  Hypotension  ARF (acute renal failure): Secondary to orthostatic hypotension. With IV fluids, creatinine normalized.  Abnormal troponins: Minimally elevated, however no higher than 0.12 and by day of discharge down to 0.03. No signs of any cardiac events. No chest pain. EKG unrevealing. Echocardiogram unrevealing in the setting of acute renal failure, likely etiology   Chronic leukopenia: Remained stable  Thrombocytopenia: Remained stable  Lung nodule/adrenal nodule/liver lesions: During workup, patient underwent CT scan of chest noting multiple pulmonary nodules as well as lesion seen in the liver and adrenal glands. This is certainly concerning for metastatic disease. Discussed with pulmonary and pulmonary nodules are not in locations amenable to bronchoscopy. Nodules are not in anyway  affecting patient's respiratory status and she otherwise looks stable. Oncology has seen patient. IR recommended abdominal CT done 8/28 given for alternative areas to biopsy. Liver cysts only noted. Biopsy planned for 8/29  Essential hypertension: Patient started be slightly tachycardic and with blood pressures increasing, will restart beta blocker  Chronic diastolic heart failure: Incidentally noted on echocardiogram. Have stopped IV fluids. Recheck renal function in the morning following abdominal CT today with contrast. If okay, will start gentle diuresis  Code Status: Full code  Family Communication: Had extensive discussion with family yesterday  Disposition Plan: Anticipate discharge in next 1-2 days following biopsy   Consultants:  Oncology  Interventional radiology  Procedures:  2-D echocardiogram done 2/37: Grade 1 diastolic dysfunction  Antibiotics:  None   Objective: BP 147/63 mmHg  Pulse 93  Temp(Src) 98.5 F (36.9 C) (Oral)  Resp 22  Ht 5\' 8"  (1.727 m)  Wt 68.1 kg (150 lb 2.1 oz)  BMI 22.83 kg/m2  SpO2 96%  Intake/Output Summary (Last 24 hours) at 03/17/15 1320 Last data filed at 03/17/15 1200  Gross per 24 hour  Intake 2640.01 ml  Output   1150 ml  Net 1490.01 ml   Filed Weights   03/15/15 0300 03/16/15 0500 03/17/15 0423  Weight: 63.73 kg (140 lb 8 oz) 66.9 kg (147 lb 7.8 oz) 68.1 kg (150 lb 2.1 oz)    Exam:   General:  Alert and oriented 3, no acute distress  Cardiovascular: Regular rhythm, borderline tachycardia  Respiratory: Clear to auscultation bilaterally  Abdomen: Soft, nontender, nondistended, positive bowel sounds  Musculoskeletal: No clubbing or cyanosis or edema   Data Reviewed: Basic Metabolic Panel:  Recent Labs Lab 03/14/15 1905 03/15/15 0435 03/15/15 0605 03/15/15 1200 03/16/15 0505  NA 130* 134* 133* 130* 131*  K 4.1 4.2 3.8 3.7 3.7  CL 99* 107 107 104 106  CO2 28 19* 20* 19* 20*  GLUCOSE 112* 100* 97 89 92    BUN 19 15 15 12 9   CREATININE 1.81* 1.41* 1.33* 1.24* 1.02*  CALCIUM 8.8* 8.0* 7.9* 7.7* 7.7*   Liver Function Tests:  Recent Labs Lab 03/14/15 1905 03/15/15 0605  AST 28 27  ALT 14 12*  ALKPHOS 60 55  BILITOT 0.6 0.3  PROT 6.2* 5.0*  ALBUMIN 3.3* 2.7*    Recent Labs Lab 03/14/15 1905  LIPASE 54*   No results for input(s): AMMONIA in the last 168 hours. CBC:  Recent Labs Lab 03/14/15 1905 03/15/15 0605 03/16/15 0505  WBC 1.5* 1.1* 1.2*  NEUTROABS 1.1* 0.8*  --   HGB 10.1* 9.4* 9.0*  HCT 29.3* 27.5* 26.0*  MCV 94.8 92.6 94.9  PLT 90* 65* 56*   Cardiac Enzymes:    Recent Labs Lab 03/14/15 2340 03/15/15 0605 03/15/15 1140 03/15/15 2055 03/16/15 0505  TROPONINI <0.03 0.08* 0.12* 0.06* 0.03   BNP (last 3 results)  Recent Labs  03/14/15 1905  BNP 23.4    ProBNP (last 3 results) No results for input(s): PROBNP in the last 8760 hours.  CBG:  Recent Labs Lab 03/14/15 1832  GLUCAP 99    Recent Results (from the past 240 hour(s))  Culture, blood (routine x 2)     Status: None (Preliminary result)   Collection Time: 03/14/15  7:00 PM  Result Value Ref Range Status   Specimen Description BLOOD RIGHT HAND  Final   Special Requests BOTTLES DRAWN AEROBIC ONLY 7ML  Final   Culture   Final    NO GROWTH 2 DAYS Performed at [facility]    Report Status PENDING  Incomplete  Culture, blood (routine x 2)     Status: None (Preliminary result)   Collection Time: 03/14/15  7:05 PM  Result Value Ref Range Status   Specimen Description BLOOD RIGHT ANTECUBITAL  Final   Special Requests BOTTLES DRAWN AEROBIC AND ANAEROBIC 5ML  Final   Culture   Final    NO GROWTH 2 DAYS Performed at [facility]    Report Status PENDING  Incomplete     Studies: Ct Abdomen Pelvis W Contrast  03/17/2015   CLINICAL DATA:  Pulmonary metastases, adrenal metastases, history of LEFT breast cancer 1981 post mastectomy and lymph node dissection  EXAM: CT ABDOMEN  AND PELVIS WITH CONTRAST  TECHNIQUE: Multidetector CT imaging of the abdomen and pelvis was performed using the standard protocol following bolus administration of intravenous contrast. Sagittal and coronal MPR images reconstructed from axial data set.  CONTRAST:  18mL OMNIPAQUE IOHEXOL 300 MG/ML SOLN IV. Dilute oral contrast.  COMPARISON:  None; correlation CT chest 03/15/2015  FINDINGS: LEFT breast prosthesis.  Small BILATERAL pleural effusions and bibasilar atelectasis.  Small to moderate pericardial effusion.  RIGHT lower lobe mass 19 x 16 mm image 9.  Multiple low-attenuation foci within liver likely cysts, largest lateral segment LEFT lobe 2.3 x 1.8 cm.  Splenic enlargement, spleen measuring 17.9 x 13.0 x 6.7 cm.  BILATERAL adrenal masses, 4.3 x 2.8 cm LEFT and 4.3 x 2.1 cm RIGHT.  Tiny BILATERAL renal cysts.  Remainder of liver, pancreas, and adrenal glands normal.  Normal appendix.  Questionable rectal wall thickening versus artifact near anus.  Mild wall thickening at gastric cardia, unchanged from CT chest, question gastric neoplasm.  Remainder of stomach, colon and small bowel loops normal.  Unremarkable bladder, ureters, uterus and adnexa.  Free intraperitoneal fluid in pelvis.  Some additional mass, adenopathy, free air or hernia.  Scattered atherosclerotic calcifications.  Normal size retroperitoneal nodes.  Bones demineralized.  Schmorl's node superior endplate T10.  Bones demineralized.  IMPRESSION: RIGHT lower lobe mass 19 x 16 mm question primary versus metastatic focus.  BILATERAL adrenal masses concerning for metastases.  Significant splenic enlargement without focal lesion.  Gastric wall thickening at cardia, tumor not excluded; endoscopic evaluation recommended to exclude gastric neoplasm.  Inferior rectal wall thickening versus artifact, recommend correlation with digital rectal exam and proctoscopy to exclude mass.  Ascites.   Electronically Signed   By: Lavonia Dana M.D.   On: 03/17/2015  11:00    Scheduled Meds: . aspirin EC  81 mg Oral Weekly  . escitalopram  5 mg Oral q morning - 10a  . omega-3 acid ethyl esters  1 g Oral q morning - 10a  . propranolol ER  80 mg Oral q morning - 10a  . rosuvastatin  10 mg Oral q morning - 10a    Continuous Infusions: . sodium chloride 10 mL/hr at 03/17/15 5573     Time spent: 25 min  [facility] Pager [phone]. If 7PM-7AM, please contact night-coverage at [email or web address], password [facility] 03/17/2015, 1:20 PM  LOS: 2 days

## 2015-03-18 DIAGNOSIS — Z853 Personal history of malignant neoplasm of breast: Secondary | ICD-10-CM

## 2015-03-18 DIAGNOSIS — R161 Splenomegaly, not elsewhere classified: Secondary | ICD-10-CM

## 2015-03-18 DIAGNOSIS — E440 Moderate protein-calorie malnutrition: Secondary | ICD-10-CM | POA: Insufficient documentation

## 2015-03-18 LAB — BASIC METABOLIC PANEL
Anion gap: 9 (ref 5–15)
BUN: 6 mg/dL (ref 6–20)
CHLORIDE: 100 mmol/L — AB (ref 101–111)
CO2: 20 mmol/L — AB (ref 22–32)
CREATININE: 0.94 mg/dL (ref 0.44–1.00)
Calcium: 7.9 mg/dL — ABNORMAL LOW (ref 8.9–10.3)
GFR calc Af Amer: >60 mL/min (ref >60)
GFR calc non Af Amer: 57 mL/min — ABNORMAL LOW (ref >60)
Glucose, Bld: 93 mg/dL (ref 65–99)
POTASSIUM: 3.2 mmol/L — AB (ref 3.5–5.1)
Sodium: 129 mmol/L — ABNORMAL LOW (ref 135–145)

## 2015-03-18 LAB — CBC
HEMATOCRIT: 26.1 % — AB (ref 36.0–46.0)
Hemoglobin: 9.1 g/dL — ABNORMAL LOW (ref 12.0–15.0)
MCH: 31.6 pg (ref 26.0–34.0)
MCHC: 34.9 g/dL (ref 30.0–36.0)
MCV: 90.6 fL (ref 78.0–100.0)
PLATELETS: 64 10*3/uL — AB (ref 150–400)
RBC: 2.88 MIL/uL — ABNORMAL LOW (ref 3.87–5.11)
RDW: 14.1 % (ref 11.5–15.5)
WBC: 1.2 10*3/uL — CL (ref 4.0–10.5)

## 2015-03-18 MED ORDER — ENSURE ENLIVE PO LIQD
237.0000 mL | Freq: Two times a day (BID) | ORAL | Status: DC
  Administered 2015-03-18 – 2015-03-21 (×4): 237 mL via ORAL

## 2015-03-18 NOTE — Care Management Note (Signed)
Case Management Note  Patient Details  Name: [name] [name] MRN: [id number] Date of Birth: 1935-06-13  Subjective/Objective: Patient used [facility] in the past, & agree to use them again.HHPT ordered,TC [facility] rep Sheppard Evens aware of referral & orders.[facility] dme rep aware of rw order. Pending bx.                   Action/Plan:d/c plan home w/HHC, rw.   Expected Discharge Date:                 Expected Discharge Plan:  [place]  In-House Referral:     Discharge planning Services  CM Consult  Post Acute Care Choice:  Home Health Choice offered to:  Patient  DME Arranged:  N/A, [place] rolling DME Agency:  [place]:  PT [place]:  [facility]  Status of Service:  Completed, signed off  Medicare Important Message Given:  Yes-second notification given Date Medicare IM Given:    Medicare IM give by:    Date Additional Medicare IM Given:    Additional Medicare Important Message give by:     If discussed at [place] of Stay Meetings, dates discussed:    Additional Comments:  Dessa Phi, RN 03/18/2015, 11:34 AM

## 2015-03-18 NOTE — Consult Note (Signed)
Chief Complaint: Patient was seen in consultation today for CT guided bone marrow biopsy  Chief Complaint  Patient presents with  . Loss of Consciousness  . Hypotension  . Fall    Referring Physician(s): Ennever,P  History of Present Illness: [name] [name] is a 80 y.o. female with history of chronic leukopenia, thrombocytopenia, IgM MGUS, remote breast cancer, who recently presented to [facility] with worsening fatigue, anorexia, back pain,  weight loss, syncope and dehydration. Imaging studies have revealed RLL lung mass, bilateral adrenal masses, splenomegaly, and gastric wall thickening . Request now received for CT guided bone marrow biopsy for further evaluation (pt has hx BM bx 2013 as well which was unremarkable).   Past Medical History  Diagnosis Date  . Abnormal WBC count     low, h/o  . Other and unspecified hyperlipidemia   . Unspecified essential hypertension   . OP (osteoporosis)   . Vitamin D deficiency   . MGUS (monoclonal gammopathy of unknown significance) 03/16/2013  . Back pain   . Breast cancer 1981    mastectomy and lymph node removal  . DM type 2 (diabetes mellitus, type 2)     Pt states she is 'prediabetic'    Past Surgical History  Procedure Laterality Date  . Mastectomy      left  . Breast biopsy      right  . Breast reconstruction    . Skin cancer removal      basal cell skin cancer  . Wisdom tooth extraction    . Tooth extraction      Allergies: Review of patient's allergies indicates no known allergies.  Medications: Prior to Admission medications   Medication Sig Start Date End Date Taking? Authorizing Provider  aspirin EC 81 MG tablet Take 81 mg by mouth once a week.    Yes Historical Provider, MD  benazepril (LOTENSIN) 20 MG tablet Take 10 mg by mouth daily.    Yes Historical Provider, MD  escitalopram (LEXAPRO) 10 MG tablet Take 5 mg by mouth every morning.    Yes Historical Provider, MD  fish oil-omega-3 fatty acids 1000 MG capsule  Take 2 g by mouth every morning.    Yes Historical Provider, MD  ibuprofen (ADVIL,MOTRIN) 200 MG tablet Take 400 mg by mouth every 6 (six) hours as needed. For back pain.   Yes Historical Provider, MD  methocarbamol (ROBAXIN) 500 MG tablet Take 500 mg by mouth every 8 (eight) hours as needed for muscle spasms.  08/16/14  Yes Historical Provider, MD  Multiple Vitamin (MULTIVITAMIN) capsule Take 1 capsule by mouth every morning.    Yes Historical Provider, MD  propranolol ER (INDERAL LA) 80 MG 24 hr capsule Take 80 mg by mouth every morning.  09/21/11  Yes Historical Provider, MD  raloxifene (EVISTA) 60 MG tablet Take 60 mg by mouth every morning.    Yes Historical Provider, MD  rosuvastatin (CRESTOR) 20 MG tablet Take 10 mg by mouth every morning.    Yes Historical Provider, MD  spironolactone-hydrochlorothiazide (ALDACTAZIDE) 25-25 MG per tablet Take 0.5 tablets by mouth every other day. TAKES 1/2 TAB DAILY 09/21/11  Yes Historical Provider, MD  Vitamin D, Ergocalciferol, (DRISDOL) 50000 UNITS CAPS Take 50,000 Units by mouth every Monday.    Yes Historical Provider, MD     Family History  Problem Relation Age of Onset  . Liver cancer Father   . Diabetes Father     Social History   Social History  . Marital  Status: Widowed    Spouse Name: N/A  . Number of Children: 2  . Years of Education: N/A   Social History Main Topics  . Smoking status: Former Smoker -- 1.00 packs/day for 10 years    Types: Cigarettes    Start date: 05/15/1956    Quit date: 07/05/1966  . Smokeless tobacco: Never Used     Comment: quit 45 years ago  . Alcohol Use: No  . Drug Use: No  . Sexual Activity: No   Other Topics Concern  . None   Social History Narrative      Review of Systems   Constitutional: Negative for fever and chills.  Respiratory: Negative for cough and shortness of breath.   Cardiovascular: Negative for chest pain.  Gastrointestinal: Negative for nausea, vomiting, abdominal pain and blood  in stool.  Genitourinary: Negative for hematuria.  Neurological:       Occ HA'S    Vital Signs: BP 114/40 mmHg  Pulse 69  Temp(Src) 99.3 F (37.4 C) (Oral)  Resp 18  Ht 5' 8"  (1.727 m)  Wt 148 lb 5.9 oz (67.3 kg)  BMI 22.56 kg/m2  SpO2 96%  Physical Exam  Constitutional: She appears well-developed and well-nourished.  Cardiovascular: Normal rate and regular rhythm.   Pulmonary/Chest: Effort normal and breath sounds normal.  Abdominal: Soft. Bowel sounds are normal.  splenomegaly  Musculoskeletal: Normal range of motion.  Neurological: She is alert.    Mallampati Score:     Imaging: Ct Head Wo Contrast  03/14/2015   CLINICAL DATA:  [place] backwards today.  Loss of consciousness.  EXAM: CT HEAD WITHOUT CONTRAST  TECHNIQUE: Contiguous axial images were obtained from the base of the skull through the vertex without intravenous contrast.  COMPARISON:  09/12/2010.  FINDINGS: Diffusely enlarged ventricles and subarachnoid spaces. No skull fracture, intracranial hemorrhage or paranasal sinus air-fluid levels.  IMPRESSION: Mild atrophy.  No acute abnormality.   Electronically Signed   By: Claudie Revering M.D.   On: 03/14/2015 19:32   Ct Chest Wo Contrast  03/14/2015   CLINICAL DATA:  Multiple episodes of syncope today. Dizziness when changing from sitting to standing. Right-sided chest pain and shortness of breath. Falls.  EXAM: CT CHEST WITHOUT CONTRAST  TECHNIQUE: Multidetector CT imaging of the chest was performed following the standard protocol without IV contrast.  COMPARISON:  None.  FINDINGS: Normal heart size. Calcification in the Coronary arteries, mitral valve annulus, and aortic valve as well as in the aorta. Small to moderate pericardial effusion. Normal caliber thoracic aorta. Scattered mediastinal lymph nodes are not pathologically enlarged. Esophagus is decompressed.  1.8 cm diameter nodule in the right lower lung. This is indeterminate and primary or metastatic disease needs to  be excluded. No other focal lung lesions. No focal consolidation or airspace disease. No pneumothorax. No pleural effusions.  Left breast reconstruction. Prominent adrenal gland nodules bilaterally, right measuring 4.8 x 1.9 cm and left measuring 2.4 x 4.7 cm. Spleen is enlarged but appears homogeneous. Multiple low-attenuation lesions in the liver are likely represent cysts but given the presence of other possible metastatic lesions, further evaluation with contrast-enhanced CT or MRI is suggested. Degenerative changes in the spine. No destructive bone lesions appreciated.  IMPRESSION: 1.8 cm diameter right lower lung nodule and bilateral adrenal gland nodules suspicious for metastatic disease. Multiple low-attenuation lesions in the liver are probably cysts but further characterization is suggested with either contrast-enhanced CT or MRI. Small to moderate pericardial effusion. Mild splenic enlargement.  Electronically Signed   By: Lucienne Capers M.D.   On: 03/14/2015 22:07   Ct Angio Chest Pe W/cm &/or Wo Cm  03/15/2015   CLINICAL DATA:  History of breast carcinoma with recent syncopal episode and back pain  EXAM: CT ANGIOGRAPHY CHEST WITH CONTRAST  TECHNIQUE: Multidetector CT imaging of the chest was performed using the standard protocol during bolus administration of intravenous contrast. Multiplanar CT image reconstructions and MIPs were obtained to evaluate the vascular anatomy.  CONTRAST:  126m OMNIPAQUE IOHEXOL 350 MG/ML SOLN  COMPARISON:  None.  FINDINGS: The lungs are well aerated bilaterally with small left-sided pleural effusion and left basilar atelectasis. Single calcified granuloma is noted in the left lung. Scattered pulmonary nodules are identified throughout the lungs bilaterally. The largest of these lies in the right lower lobe best seen on image number 66 of series 8 measuring 19 mm in greatest dimension. A 6-7 mm nodule is noted in the left lower lobe best seen on image number 54 of  series 8 as well as a tiny 2-3 mm nodule in the left apex best seen on image number 13 of series 8. Given the patient's clinical history these are consistent with metastatic foci.  The thoracic inlet is within normal limits. The thoracic aorta shows mild calcifications without aneurysmal dilatation or dissection. A pericardial effusion is noted. This measures approximately 2.3 cm in greatest thickness posteriorly. Mild coronary calcifications are noted. The pulmonary artery is well visualized and demonstrates a normal branching pattern. No filling defects to suggest pulmonary emboli are identified. No hilar or mediastinal adenopathy is noted. Some thickening of the midesophagus is noted just above the level of the carina best seen on image number 97 of series 7.  Changes consistent with breast implant are noted on the left. This is consistent with the patient's given clinical history of carcinoma of the breast. Postsurgical changes in the axilla are seen. No axillary adenopathy is identified. The upper abdomen again demonstrates hypodensities within the liver similar to that seen on prior CT. Adrenal lesions are again noted bilaterally.  Review of the MIP images confirms the above findings.  IMPRESSION: Changes consistent with metastatic disease within the adrenal glands bilaterally as well as the lungs bilaterally.  Multiple hypodensities are again seen within liver which may represent metastatic disease. Further evaluation is recommended.  No evidence of pulmonary embolism.  Pericardial effusion.  Thickening of the midesophagus of uncertain significance.   Electronically Signed   By: MInez CatalinaM.D.   On: 03/15/2015 10:05   Ct Abdomen Pelvis W Contrast  03/17/2015   CLINICAL DATA:  Pulmonary metastases, adrenal metastases, history of LEFT breast cancer 1981 post mastectomy and lymph node dissection  EXAM: CT ABDOMEN AND PELVIS WITH CONTRAST  TECHNIQUE: Multidetector CT imaging of the abdomen and pelvis was  performed using the standard protocol following bolus administration of intravenous contrast. Sagittal and coronal MPR images reconstructed from axial data set.  CONTRAST:  1063mOMNIPAQUE IOHEXOL 300 MG/ML SOLN IV. Dilute oral contrast.  COMPARISON:  None; correlation CT chest 03/15/2015  FINDINGS: LEFT breast prosthesis.  Small BILATERAL pleural effusions and bibasilar atelectasis.  Small to moderate pericardial effusion.  RIGHT lower lobe mass 19 x 16 mm image 9.  Multiple low-attenuation foci within liver likely cysts, largest lateral segment LEFT lobe 2.3 x 1.8 cm.  Splenic enlargement, spleen measuring 17.9 x 13.0 x 6.7 cm.  BILATERAL adrenal masses, 4.3 x 2.8 cm LEFT and 4.3 x 2.1 cm RIGHT.  Tiny BILATERAL renal cysts.  Remainder of liver, pancreas, and adrenal glands normal.  Normal appendix.  Questionable rectal wall thickening versus artifact near anus.  Mild wall thickening at gastric cardia, unchanged from CT chest, question gastric neoplasm.  Remainder of stomach, colon and small bowel loops normal.  Unremarkable bladder, ureters, uterus and adnexa.  Free intraperitoneal fluid in pelvis.  Some additional mass, adenopathy, free air or hernia.  Scattered atherosclerotic calcifications.  Normal size retroperitoneal nodes.  Bones demineralized.  Schmorl's node superior endplate T10.  Bones demineralized.  IMPRESSION: RIGHT lower lobe mass 19 x 16 mm question primary versus metastatic focus.  BILATERAL adrenal masses concerning for metastases.  Significant splenic enlargement without focal lesion.  Gastric wall thickening at cardia, tumor not excluded; endoscopic evaluation recommended to exclude gastric neoplasm.  Inferior rectal wall thickening versus artifact, recommend correlation with digital rectal exam and proctoscopy to exclude mass.  Ascites.   Electronically Signed   By: Lavonia Dana M.D.   On: 03/17/2015 11:00   Dg Chest Port 1 View  03/14/2015   CLINICAL DATA:  Status post fall.  History of  breast cancer.  EXAM: PORTABLE CHEST - 1 VIEW  COMPARISON:  June 23, 2012  FINDINGS: The heart size and mediastinal contours are stable. There is no focal infiltrate, pulmonary edema, or pleural effusion. Left axillary surgical clips are identified unchanged. The visualized skeletal structures are stable.  IMPRESSION: No active cardiopulmonary disease.   Electronically Signed   By: Abelardo Diesel M.D.   On: 03/14/2015 18:45    Labs:  CBC:  Recent Labs  03/14/15 1905 03/15/15 0605 03/16/15 0505 03/18/15 0435  WBC 1.5* 1.1* 1.2* 1.2*  HGB 10.1* 9.4* 9.0* 9.1*  HCT 29.3* 27.5* 26.0* 26.1*  PLT 90* 65* 56* 64*    COAGS:  Recent Labs  03/14/15 1905  INR 1.23    BMP:  Recent Labs  03/15/15 0605 03/15/15 1200 03/16/15 0505 03/18/15 0435  NA 133* 130* 131* 129*  K 3.8 3.7 3.7 3.2*  CL 107 104 106 100*  CO2 20* 19* 20* 20*  GLUCOSE 97 89 92 93  BUN 15 12 9 6   CALCIUM 7.9* 7.7* 7.7* 7.9*  CREATININE 1.33* 1.24* 1.02* 0.94  GFRNONAA 37* 41* 52* 57*  GFRAA 43* 47* 60* >60    LIVER FUNCTION TESTS:  Recent Labs  10/08/14 1321 03/14/15 1905 03/15/15 0605  BILITOT 0.80 0.6 0.3  AST 29 28 27   ALT 15 14 12*  ALKPHOS 71 60 55  PROT 6.9 6.2* 5.0*  ALBUMIN  --  3.3* 2.7*    TUMOR MARKERS: No results for input(s): AFPTM, CEA, CA199, CHROMGRNA in the last 8760 hours.  Assessment and Plan: [name] [name] is a 80 y.o. female with history of chronic leukopenia, thrombocytopenia, IgM MGUS, remote breast cancer, who recently presented to [facility] with worsening fatigue, anorexia, back pain,  weight loss, syncope and dehydration. Imaging studies have revealed RLL lung mass, bilateral adrenal masses, splenomegaly, and gastric wall thickening . Request now received for CT guided bone marrow biopsy for further evaluation (pt has hx BM bx 2013 as well which was unremarkable).Risks and benefits discussed with the patient including, but not limited to bleeding, infection, damage to adjacent  structures or low yield requiring additional tests. All of the patient's questions were answered, patient is agreeable to proceed.Consent signed and in chart. Procedure tent planned for 8/30.     Thank you for this interesting consult.  I greatly enjoyed meeting Staci  Osborne and look forward to participating in their care.  A copy of this report was sent to the requesting provider on this date.  Signed: D. Rowe Robert 03/18/2015, 3:56 PM   20 minutes were spent  in face to face in clinical consultation, greater than 50% of which was counseling/coordinating care for CT guided bone marrow biopsy

## 2015-03-18 NOTE — Care Management Important Message (Signed)
Important Message  Patient Details  Name: Theone Bowell MRN: [id number] Date of Birth: 1935-06-21   Medicare Important Message Given:  Yes-second notification given    Camillo Flaming 03/18/2015, 11:14 AMImportant Message  Patient Details  Name: Alesa Echevarria MRN: [id number] Date of Birth: [birth year]   Medicare Important Message Given:  Yes-second notification given    Camillo Flaming 03/18/2015, 11:14 AM

## 2015-03-18 NOTE — Progress Notes (Signed)
PROGRESS NOTE  Mckensey Berghuis OFB:[id number] DOB: 1935-03-07 DOA: 03/14/2015 PCP: Jerlyn Ly, MD  HPI/Recap of past 45 hours: 80 year old female with past medical history of hypertension and mild pancytopenia plus distant history of breast cancer admitted on evening of 8/25 after a syncopal event. Patient had been not feeling well for the past few weeks and had very poor by mouth intake. In the emergency room, she was noted to be volume depleted requiring several liters of normal saline. She was noted to have acute renal failure with a creatinine of 1.81 (baseline near normal)  Patient doing okay. No pain. No shortness of breath. Eager to get biopsy done.  Assessment/Plan:  MGUS (monoclonal gammopathy of unknown significance): Stable.   Syncope caused by orthostatic hypotension brought on by poor by mouth intake plus continued blood pressure medications. Patient's blood pressure responded nicely to IV fluids. Antihypertensives held. Suspect her poor by mouth intake is from background malignancy. Echocardiogram noted mild pericardial effusion plus diastolic dysfunction, but nothing significant. Patient seen by physical therapy who recommended home health PT which has been set up.   ARF (acute renal failure): Secondary to orthostatic hypotension. With IV fluids, creatinine normalized.  Abnormal troponins: Minimally elevated, however no higher than 0.12 and by day of discharge down to 0.03. No signs of any cardiac events. No chest pain. EKG unrevealing. Echocardiogram unrevealing in the setting of acute renal failure, likely etiology   Chronic leukopenia: Remained stable  Thrombocytopenia: Remained stable, although to low for bone marrow biopsy. We'll transfuse platelets.  Lung nodule/adrenal nodule/liver lesions: During workup, patient underwent CT scan of chest noting multiple pulmonary nodules as well as lesion seen in the liver and adrenal glands. This is certainly concerning for  metastatic disease. Discussed with pulmonary and pulmonary nodules are not in locations amenable to bronchoscopy. Nodules are not in anyway affecting patient's respiratory status and she otherwise looks stable. Oncology has seen patient. IR recommended abdominal CT done 8/28 given for alternative areas to biopsy. Liver cysts only noted. Oncology followed up today recommending bone marrow biopsy instead. Plan is for biopsy possibly tomorrow or Wednesday.  Essential hypertension: Patient started be slightly tachycardic and with blood pressures increasing, restarted beta blocker  Chronic diastolic heart failure: Incidentally noted on echocardiogram. Have stopped IV fluids. Recheck renal function in the morning following abdominal CT today with contrast. If okay, will start gentle diuresis  Code Status: Full code  Family Communication: Had extensive discussion with family over the weekend  Disposition Plan: Anticipate discharge in next 1-2 days following biopsy   Consultants:  Oncology  Interventional radiology  Procedures:  2-D echocardiogram done 7/82: Grade 1 diastolic dysfunction  Antibiotics:  None   Objective: BP 111/50 mmHg  Pulse 72  Temp(Src) 98.8 F (37.1 C) (Oral)  Resp 20  Ht _0  (1.727 m)  Wt 67.3 kg (148 lb 5.9 oz)  BMI 22.56 kg/m2  SpO2 94%  Intake/Output Summary (Last 24 hours) at 03/18/15 1452 Last data filed at 03/18/15 1015  Gross per 24 hour  Intake  363.5 ml  Output   1050 ml  Net -686.5 ml   Filed Weights   03/16/15 0500 03/17/15 0423 03/18/15 0643  Weight: 66.9 kg (147 lb 7.8 oz) 68.1 kg (150 lb 2.1 oz) 67.3 kg (148 lb 5.9 oz)    Exam:   General:  Alert and oriented 3, no acute distress  Cardiovascular: Regular rate and rhythm  Respiratory: Clear to auscultation bilaterally  Abdomen: Soft, nontender, nondistended, hypoactive  bowel sounds  Musculoskeletal: No clubbing or cyanosis or edema   Data Reviewed: Basic Metabolic  Panel:  Recent Labs Lab 03/15/15 0435 03/15/15 0605 03/15/15 1200 03/16/15 0505 03/18/15 0435  NA 134* 133* 130* 131* 129*  K 4.2 3.8 3.7 3.7 3.2*  CL 107 107 104 106 100*  CO2 19* 20* 19* 20* 20*  GLUCOSE 100* 97 89 92 93  BUN _0 CREATININE 1.41* 1.33* 1.24* 1.02* 0.94  CALCIUM 8.0* 7.9* 7.7* 7.7* 7.9*   Liver Function Tests:  Recent Labs Lab 03/14/15 1905 03/15/15 0605  AST 28 27  ALT 14 12*  ALKPHOS 60 55  BILITOT 0.6 0.3  PROT 6.2* 5.0*  ALBUMIN 3.3* 2.7*    Recent Labs Lab 03/14/15 1905  LIPASE 54*   No results for input(s): AMMONIA in the last 168 hours. CBC:  Recent Labs Lab 03/14/15 1905 03/15/15 0605 03/16/15 0505 03/18/15 0435  WBC 1.5* 1.1* 1.2* 1.2*  NEUTROABS 1.1* 0.8*  --   --   HGB 10.1* 9.4* 9.0* 9.1*  HCT 29.3* 27.5* 26.0* 26.1*  MCV 94.8 92.6 94.9 90.6  PLT 90* 65* 56* 64*   Cardiac Enzymes:    Recent Labs Lab 03/14/15 2340 03/15/15 0605 03/15/15 1140 03/15/15 2055 03/16/15 0505  TROPONINI <0.03 0.08* 0.12* 0.06* 0.03   BNP (last 3 results)  Recent Labs  03/14/15 1905  BNP 23.4    ProBNP (last 3 results) No results for input(s): PROBNP in the last 8760 hours.  CBG:  Recent Labs Lab 03/14/15 1832  GLUCAP 99    Recent Results (from the past 240 hour(s))  Culture, blood (routine x 2)     Status: None (Preliminary result)   Collection Time: 03/14/15  7:00 PM  Result Value Ref Range Status   Specimen Description BLOOD RIGHT HAND  Final   Special Requests BOTTLES DRAWN AEROBIC ONLY 7ML  Final   Culture   Final    NO GROWTH 4 DAYS Performed at [facility]    Report Status PENDING  Incomplete  Culture, blood (routine x 2)     Status: None (Preliminary result)   Collection Time: 03/14/15  7:05 PM  Result Value Ref Range Status   Specimen Description BLOOD RIGHT ANTECUBITAL  Final   Special Requests BOTTLES DRAWN AEROBIC AND ANAEROBIC 5ML  Final   Culture   Final    NO GROWTH 4  DAYS Performed at [facility]    Report Status PENDING  Incomplete     Studies: No results found.  Scheduled Meds: . aspirin EC  81 mg Oral Weekly  . escitalopram  5 mg Oral q morning - 10a  . feeding supplement (ENSURE ENLIVE)  237 mL Oral BID BM  . omega-3 acid ethyl esters  1 g Oral q morning - 10a  . propranolol ER  80 mg Oral q morning - 10a  . rosuvastatin  10 mg Oral q morning - 10a    Continuous Infusions: . sodium chloride 10 mL/hr at 03/17/15 1191     Time spent: 15 min  [facility] Pager [phone]. If 7PM-7AM, please contact night-coverage at [email or web address], password [facility] 03/18/2015, 2:52 PM  LOS: 3 days

## 2015-03-18 NOTE — Progress Notes (Signed)
Physical Therapy Treatment Patient Details Name: [name] [name] MRN: [id number] DOB: May 19, 1935 Today's Date: 03/18/2015    History of Present Illness [name] is a 80 y.o. female and history of hypertension, chronic anemia thrombocytopenia and leukopenia with history of MGUS being followed by oncologist, previous history of breast cancer was brought to the ER the patient had 2 episodes of syncope. Both episodes happened within 1 hour interval. Both the time patient was walking. Patient states of recently patient has been feeling dizzy when she tries to stand up.  Tests show possible lung nodules with concern for metastatic process.  PMH of breast CA.    PT Comments    Assisted pt OOB and amb in hallway twice using a walker with one sitting rest break.   Follow Up Recommendations        Equipment Recommendations       Recommendations for Other Services       Precautions / Restrictions Precautions Precautions: Fall    Mobility  Bed Mobility Overal bed mobility: Modified Independent             General bed mobility comments: increased time  Transfers Overall transfer level: Needs assistance Equipment used: Rolling walker (2 wheeled) Transfers: Sit to/from Stand Sit to Stand: Min guard;Min assist         General transfer comment: cues for hand placement.  took several attempts to achieve standing from bed.  Used walker for increased safety.  Ambulation/Gait Ambulation/Gait assistance: Min guard;Min assist Ambulation Distance (Feet): 400 Feet (200 x 2) Assistive device: Rolling walker (2 wheeled) Gait Pattern/deviations: Step-through pattern;Trunk flexed Gait velocity: decreased   General Gait Details: used a RW to increase amb distance and upright time.  Noted mild unsteadiness with c/o weakness from "laying around so much".  Pt amb 200 feet with a sitting rest break then another 200 feet back to her room.    Stairs            Wheelchair Mobility     Modified Rankin (Stroke Patients Only)       Balance                                    Cognition Arousal/Alertness: Awake/alert Behavior During Therapy: WFL for tasks assessed/performed Overall Cognitive Status: Within Functional Limits for tasks assessed                      Exercises      General Comments        Pertinent Vitals/Pain Pain Assessment: No/denies pain    Home Living                      Prior Function            PT Goals (current goals can now be found in the care plan section)      Frequency       PT Plan      Co-evaluation             End of Session           Time: 3016-0109 PT Time Calculation (min) (ACUTE ONLY): 21 min  Charges:  $Gait Training: 8-22 mins                    G Codes:      Rica Koyanagi  PTA [facility]  Acute  Rehab Pager      [phone]

## 2015-03-18 NOTE — Progress Notes (Signed)
I appreciate the wonderful care that Ms. Chacko has gotten.I very much appreciate Dr.Feng's expert input.  She has not had breast cancer now for almost 30 years. I would think it be unusual for her to have recurrent disease.  She's never smoked. She has no obvious occupational exposures for lung cancer.  I've been following this IgM  Monoclonal myopathy. She has splenomegaly. I have to believe that all this is somehow related.  I would think that it'll be hard to do a biopsy with a platelet count of 56,000.  I do believe that she needs a bone marrow biopsy. Last one was done 3 years ago.  i will also repeat her monoclonal studies. Back in March, everything looked fairly stable.  On her physical exam, her vital signs are all stable. Temperature 98.8. Blood pressure 111/50. Pulse is 72. Her head and neck shows no adenopathy. Axilla, there might be a palpable lymph node in the left axilla. Lungs are clear. Cardiac exam regular rate and rhythm. Abdomen is soft. I really cannot palpate her spleen although I really do not press too hard. Extremities shows no edema. Skin exam shows no rashes.  Again, a bone marrow biopsy is clearly needed.  We will certainly follow along closely.  [name].  Phillipians 4:13

## 2015-03-18 NOTE — Progress Notes (Signed)
Initial Nutrition Assessment  DOCUMENTATION CODES:   Non-severe (moderate) malnutrition in context of chronic illness  INTERVENTION:  - Will order Ensure Enlive BID, each supplement provides 350 kcal and 20 grams of protein - RD will continue to monitor for needs  NUTRITION DIAGNOSIS:   Inadequate oral intake related to poor appetite as evidenced by per patient/family report.  GOAL:   Patient will meet greater than or equal to 90% of their needs  MONITOR:   PO intake, Supplement acceptance, Weight trends, Labs, I & O's, Skin  REASON FOR ASSESSMENT:   Malnutrition Screening Tool  ASSESSMENT:   80 y.o. female and history of hypertension, chronic anemia thrombocytopenia and leukopenia with history of MGUS being followed by oncologist, previous history of breast cancer was brought to the ER the patient had 2 episodes of syncope today. Both episodes happened within 1 hour interval. Both the time patient was walking. Patient states of recently patient has been feeling dizzy when she tries to stand up. Patient also was told by her primary care physician to decrease her spironolactone HCTZ due to low blood pressures a few months ago. Patient denies any nausea vomiting abdominal pain diarrhea chest pain or shortness of breath or cough or any fever chills.  Pt seen for MST. BMI indicates normal weight status. Pt states she was NPO for breakfast due to pending biopsy which was subsequently canceled. At time of visit pt was eating side salad and baked sweet potato.  She states that for the past 2-3 months she has had a decreased appetite. 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 She denies chewing/swallowing difficulties. At home she was not drinking nutrition supplements such as Ensure or Boost but is interested in trying them during hospitalization due to poor appetite.  She reports UBW of 160 lbs with weight loss occuring  over the pas ~3 months. This would indicate 12 lb weight loss (7.5% body weight) which is significant for the time frame. Chart review shows 3 lb weight loss (2% body weight) in the past 5 months which is not significant for time frame; will continue to monitor weight trends.  Mild muscle and fat wasting noted during physical assessment. Not meeting needs. Medications reviewed. Labs reviewed; Na: 129 mmol/L, K: 3.2 mmol/L, Cl: 100 mmol/L, Ca: 7.9 mg/dL, GFR: 57.   Diet Order:  Diet Heart Room service appropriate?: Yes; Fluid consistency:: Thin Diet - low sodium heart healthy  Skin:  Wound (see comment) (Ecchymosis to L buttocks)  Last BM:  8/28  Height:   Ht Readings from Last 1 Encounters:  03/14/15 5\' 8"  (1.727 m)    Weight:   Wt Readings from Last 1 Encounters:  03/18/15 148 lb 5.9 oz (67.3 kg)    Ideal Body Weight:  63.64 kg (kg)  BMI:  Body mass index is 22.56 kg/(m^2).  Estimated Nutritional Needs:   Kcal:  1350-1550  Protein:  55-65 grams  Fluid:  2-2.2 L/day  EDUCATION NEEDS:   No education needs identified at this time     Jarome Matin, RD, LDN Inpatient Clinical Dietitian Pager # [phone] After hours/weekend pager # [phone]

## 2015-03-18 NOTE — Progress Notes (Addendum)
Patient ID: [name] [name], female   DOB: Mar 07, 1935, 80 y.o.   MRN: [id number] Request received for bone marrow biopsy on pt. Hx noted, imaging reviewed by Dr. Kathlene Cote. Due to full schedule today, BM bx cannot be done until Tues or Wed. or scheduled as OP if pt d/c'd home soon. Dr. Kathlene Cote also recommends PET scan as well as pulmonary consult prior to consideration of lung or adrenal bx (plt count currently too low to perform bx at these sites now). Please page him at [phone] with any additional questions.

## 2015-03-19 ENCOUNTER — Inpatient Hospital Stay (HOSPITAL_COMMUNITY): Payer: Medicare Other

## 2015-03-19 DIAGNOSIS — E278 Other specified disorders of adrenal gland: Secondary | ICD-10-CM | POA: Insufficient documentation

## 2015-03-19 LAB — CBC
HEMATOCRIT: 26.3 % — AB (ref 36.0–46.0)
Hemoglobin: 9.1 g/dL — ABNORMAL LOW (ref 12.0–15.0)
MCH: 31 pg (ref 26.0–34.0)
MCHC: 34.6 g/dL (ref 30.0–36.0)
MCV: 89.5 fL (ref 78.0–100.0)
Platelets: 62 10*3/uL — ABNORMAL LOW (ref 150–400)
RBC: 2.94 MIL/uL — ABNORMAL LOW (ref 3.87–5.11)
RDW: 13.9 % (ref 11.5–15.5)
WBC: 0.8 10*3/uL — CL (ref 4.0–10.5)

## 2015-03-19 LAB — CULTURE, BLOOD (ROUTINE X 2)
Culture: NO GROWTH
Culture: NO GROWTH

## 2015-03-19 LAB — URINALYSIS, ROUTINE W REFLEX MICROSCOPIC
Bilirubin Urine: NEGATIVE
Glucose, UA: NEGATIVE mg/dL
Ketones, ur: NEGATIVE mg/dL
Leukocytes, UA: NEGATIVE
NITRITE: NEGATIVE
PH: 5 (ref 5.0–8.0)
Protein, ur: NEGATIVE mg/dL
SPECIFIC GRAVITY, URINE: 1.004 — AB (ref 1.005–1.030)
UROBILINOGEN UA: 0.2 mg/dL (ref 0.0–1.0)

## 2015-03-19 LAB — BASIC METABOLIC PANEL
Anion gap: 7 (ref 5–15)
BUN: 6 mg/dL (ref 6–20)
CALCIUM: 7.9 mg/dL — AB (ref 8.9–10.3)
CHLORIDE: 98 mmol/L — AB (ref 101–111)
CO2: 24 mmol/L (ref 22–32)
CREATININE: 0.89 mg/dL (ref 0.44–1.00)
GFR calc non Af Amer: >60 mL/min (ref >60)
GLUCOSE: 103 mg/dL — AB (ref 65–99)
Potassium: 3.3 mmol/L — ABNORMAL LOW (ref 3.5–5.1)
Sodium: 129 mmol/L — ABNORMAL LOW (ref 135–145)

## 2015-03-19 LAB — TYPE AND SCREEN
ABO/RH(D): B POS
Antibody Screen: NEGATIVE

## 2015-03-19 LAB — BONE MARROW EXAM

## 2015-03-19 LAB — URINE MICROSCOPIC-ADD ON

## 2015-03-19 MED ORDER — MIDAZOLAM HCL 2 MG/2ML IJ SOLN
INTRAMUSCULAR | Status: AC
  Filled 2015-03-19: qty 6

## 2015-03-19 MED ORDER — SODIUM CHLORIDE 0.9 % IV SOLN
Freq: Once | INTRAVENOUS | Status: AC
  Administered 2015-03-19: 10:00:00 via INTRAVENOUS

## 2015-03-19 MED ORDER — MIDAZOLAM HCL 2 MG/2ML IJ SOLN
INTRAMUSCULAR | Status: AC | PRN
  Administered 2015-03-19: 1 mg via INTRAVENOUS

## 2015-03-19 MED ORDER — METHYLPREDNISOLONE SODIUM SUCC 125 MG IJ SOLR
80.0000 mg | Freq: Once | INTRAMUSCULAR | Status: AC
  Administered 2015-03-19: 80 mg via INTRAVENOUS
  Filled 2015-03-19: qty 1.28

## 2015-03-19 MED ORDER — FUROSEMIDE 10 MG/ML IJ SOLN
20.0000 mg | Freq: Once | INTRAMUSCULAR | Status: AC
  Administered 2015-03-19: 20 mg via INTRAVENOUS
  Filled 2015-03-19: qty 2

## 2015-03-19 MED ORDER — FUROSEMIDE 10 MG/ML IJ SOLN
20.0000 mg | Freq: Every day | INTRAMUSCULAR | Status: DC
  Administered 2015-03-19 – 2015-03-20 (×2): 20 mg via INTRAVENOUS
  Filled 2015-03-19 (×2): qty 2

## 2015-03-19 MED ORDER — GADOBENATE DIMEGLUMINE 529 MG/ML IV SOLN
15.0000 mL | Freq: Once | INTRAVENOUS | Status: AC | PRN
  Administered 2015-03-19: 13 mL via INTRAVENOUS

## 2015-03-19 MED ORDER — SODIUM CHLORIDE 0.9 % IV SOLN: Freq: Once | INTRAVENOUS | Status: DC

## 2015-03-19 MED ORDER — FENTANYL CITRATE (PF) 100 MCG/2ML IJ SOLN
INTRAMUSCULAR | Status: AC
  Filled 2015-03-19: qty 6

## 2015-03-19 MED ORDER — FENTANYL CITRATE (PF) 100 MCG/2ML IJ SOLN
INTRAMUSCULAR | Status: AC | PRN
  Administered 2015-03-19: 50 ug via INTRAVENOUS

## 2015-03-19 MED ORDER — SODIUM CHLORIDE 0.9 % IV SOLN
40.0000 mg | Freq: Once | INTRAVENOUS | Status: AC
Start: 2015-03-19 — End: 2015-03-19
  Administered 2015-03-19: 40 mg via INTRAVENOUS
  Filled 2015-03-19: qty 4

## 2015-03-19 NOTE — Progress Notes (Signed)
[name] [name] is going for her bone marrow biopsy today.  Her blood counts are trending downward. So this might be from her being in the hospital.  She's had no issues with syncope. I did not see what her cardiac monitor has shown.her EKG on the 27th showed normal sinus rhythm.  Her echocardiogram showed good ventricular function. However, she did have a moderate pericardial effusion. I'm not sure what this is indicative of.  I feel that she probably going to need a biopsy of the right lung lesion. It is fairly peripheral in location. She has a thrombocytopenia. I probably would have to transfuse her before the procedure.  I want to try to tie everything together with one diagnosis. It is certainly difficult to do that.  I also would get an MRI of the abdomen. This may give [place] a better look at the adrenal lesions in the liver lesions.   She has had no problems with nausea or vomiting. He's had no fever. She's had no bleeding.   On her labs today, her potassium is a little bit low. Sodium is low bit on the low side.  I probably will also consider a 24-hour urine with her. I wanted to check for light chains.  On her physical exam, her blood pressure is stable. Her temperature is 90.8. Pulse is 64. Blood pressure is 112/47. On her exam, her spleen is a couple 7 is below the left costal margin. I cannot palpate her liver tip.There is no adenopathy noted on exam. Cardiac exam regular rate and rhythm.  Or now, I still think that she needs to undergo further diagnostic studies.  I appreciate all the great care she is getting up on 4 E. 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  [name] [name]  Psalm 7:17

## 2015-03-19 NOTE — Progress Notes (Signed)
PT Cancellation Note  Patient Details Name: Lianne Carreto MRN: [id number] DOB: [birth year]   Cancelled Treatment:     Bone Marrow Biopsy this am   Nathanial Rancher 03/19/2015, 11:36 AM

## 2015-03-19 NOTE — Progress Notes (Signed)
PROGRESS NOTE  [name] [name] JOA:[id number] DOB: [birth year] DOA: 03/14/2015 PCP: Jerlyn Ly, MD  HPI/Recap of past 40 hours: 80 year old female with past medical history of hypertension and mild pancytopenia plus distant history of breast cancer admitted on evening of 8/25 after a syncopal event. Patient had been not feeling well for the past few weeks and had very poor by mouth intake. In the emergency room, she was noted to be volume depleted requiring several liters of normal saline. She was noted to have acute renal failure with a creatinine of 1.81 (baseline near normal)  Following hydration, creatinine normalized. Syncope felt to be secondary to orthostatic hypotension brought on by poor by mouth intake and use of antihypertensives. During her workup, CT scan done to rule out PE noted multiple on her nodules bilaterally concerning for metastatic disease. Oncology notified. Patient underwent bone marrow biopsy today, 8/30. Tolerated procedure well. Seen post procedure and is somewhat somnolent, otherwise with no complaints in no acute distress.  Assessment/Plan:  MGUS (monoclonal gammopathy of unknown significance): Stable.   Syncope caused by orthostatic hypotension brought on by poor by mouth intake plus continued blood pressure medications. Patient's blood pressure responded nicely to IV fluids. Antihypertensives held. Suspect her poor by mouth intake is from background malignancy. Echocardiogram noted mild pericardial effusion plus diastolic dysfunction, but nothing significant. Patient seen by physical therapy who recommended home health PT which has been set up.   ARF (acute renal failure): Secondary to orthostatic hypotension. With IV fluids, creatinine normalized.  Abnormal troponins: Minimally elevated, however no higher than 0.12 and by day of discharge down to 0.03. No signs of any cardiac events. No chest pain. EKG unrevealing. Echocardiogram unrevealing in the setting of acute  renal failure, likely etiology   Chronic leukopenia: Has started to trend downward. Oncology following.  Thrombocytopenia enia: Remained stable, although to low for bone marrow biopsy. We'll transfuse platelets.   Lung nodule/adrenal nodule/liver lesions: During workup, patient underwent CT scan of chest noting multiple pulmonary nodules as well as lesion seen in the liver and adrenal glands. This is certainly concerning for metastatic disease. Discussed with pulmonary and pulmonary nodules are not in locations amenable to bronchoscopy. Nodules are not in anyway affecting patient's respiratory status and she otherwise looks stable. Oncology has seen patient. IR recommended abdominal CT done 8/28 given for alternative areas to biopsy. Liver cysts only noted. Oncology followed up, patient status post bone marrow biopsy. MRI pending for better evaluation of adrenal masses. CT-guided biopsy of either lung or adrenals ordered   Essential hypertension: Patient started be slightly tachycardic and with blood pressures increasing, restarted beta blocker  Chronic diastolic heart failure: Incidentally noted on echocardiogram. Have stopped IV fluids. given good renal function now, have started gentle diuresis   Code Status: Full code  Family Communication: Had extensive discussion with family over the weekend   Disposition Plan: Anticipate discharge in nefew days after workup complete and outpatient plan set up               Consultants   interventional radiology  Oncology  Procedures:  2-D echocardiogram done 6/01: Grade 1 diastolic dysfunction  Antibiotics:  None   Objective: BP 106/59 mmHg  Pulse 64  Temp(Src) 97.6 F (36.4 C) (Oral)  Resp 18  Ht 5' 8"  (1.727 m)  Wt 66.588 kg (146 lb 12.8 oz)  BMI 22.33 kg/m2  SpO2 97%  Intake/Output Summary (Last 24 hours) at 03/19/15 1740 Last data filed at 03/19/15 1001  Gross  per 24 hour  Intake    410 ml  Output    350 ml  Net     60  ml   Filed Weights   03/17/15 0423 03/18/15 0643 03/19/15 0446  Weight: 68.1 kg (150 lb 2.1 oz) 67.3 kg (148 lb 5.9 oz) 66.588 kg (146 lb 12.8 oz)    Exam:   General:  resting comfortably   Cardiovascular: Regular rate and rhythm  Respiratory: Clear to auscultation bilaterally  Abdomen: Soft, nontender, nondistended, hypoactive bowel sounds  Musculoskeletal: No clubbing or cyanosis or edema   Data Reviewed: Basic Metabolic Panel:  Recent Labs Lab 03/15/15 0605 03/15/15 1200 03/16/15 0505 03/18/15 0435 03/19/15 0508  NA 133* 130* 131* 129* 129*  K 3.8 3.7 3.7 3.2* 3.3*  CL 107 104 106 100* 98*  CO2 20* 19* 20* 20* 24  GLUCOSE 97 89 92 93 103*  BUN 15 12 9 6 6   CREATININE 1.33* 1.24* 1.02* 0.94 0.89  CALCIUM 7.9* 7.7* 7.7* 7.9* 7.9*   Liver Function Tests:  Recent Labs Lab 03/14/15 1905 03/15/15 0605  AST 28 27  ALT 14 12*  ALKPHOS 60 55  BILITOT 0.6 0.3  PROT 6.2* 5.0*  ALBUMIN 3.3* 2.7*    Recent Labs Lab 03/14/15 1905  LIPASE 54*   No results for input(s): AMMONIA in the last 168 hours. CBC:  Recent Labs Lab 03/14/15 1905 03/15/15 0605 03/16/15 0505 03/18/15 0435 03/19/15 0508  WBC 1.5* 1.1* 1.2* 1.2* 0.8*  NEUTROABS 1.1* 0.8*  --   --   --   HGB 10.1* 9.4* 9.0* 9.1* 9.1*  HCT 29.3* 27.5* 26.0* 26.1* 26.3*  MCV 94.8 92.6 94.9 90.6 89.5  PLT 90* 65* 56* 64* 62*   Cardiac Enzymes:    Recent Labs Lab 03/14/15 2340 03/15/15 0605 03/15/15 1140 03/15/15 2055 03/16/15 0505  TROPONINI <0.03 0.08* 0.12* 0.06* 0.03   BNP (last 3 results)  Recent Labs  03/14/15 1905  BNP 23.4    ProBNP (last 3 results) No results for input(s): PROBNP in the last 8760 hours.  CBG:  Recent Labs Lab 03/14/15 1832  GLUCAP 99    Recent Results (from the past 240 hour(s))  Culture, blood (routine x 2)     Status: None   Collection Time: 03/14/15  7:00 PM  Result Value Ref Range Status   Specimen Description BLOOD RIGHT HAND  Final   Special  Requests BOTTLES DRAWN AEROBIC ONLY 7ML  Final   Culture   Final    NO GROWTH 5 DAYS Performed at [facility]    Report Status 03/19/2015 FINAL  Final  Culture, blood (routine x 2)     Status: None   Collection Time: 03/14/15  7:05 PM  Result Value Ref Range Status   Specimen Description BLOOD RIGHT ANTECUBITAL  Final   Special Requests BOTTLES DRAWN AEROBIC AND ANAEROBIC 5ML  Final   Culture   Final    NO GROWTH 5 DAYS Performed at [facility]    Report Status 03/19/2015 FINAL  Final     Studies: Ct Biopsy  03/19/2015   INDICATION: Pancytopenia. Please perform CT guided bone marrow biopsy for tissue diagnostic purposes  EXAM: CT GUIDED BONE MARROW BIOPSY AND ASPIRATION.  MEDICATIONS: Fentanyl 50 mcg IV; Versed 1 mg IV  ANESTHESIA/SEDATION: Sedation Time  10 minutes  CONTRAST:  None  COMPLICATIONS: None immediate.  PROCEDURE: Informed consent was obtained from the patient following an explanation of the procedure, risks, benefits  and alternatives. The patient understands, agrees and consents for the procedure. All questions were addressed. A time out was performed prior to the initiation of the procedure. The patient was positioned prone and non-contrast localization CT was performed of the pelvis to demonstrate the iliac marrow spaces. The operative site was prepped and draped in the usual sterile fashion.  Under sterile conditions and local anesthesia, a 22 gauge spinal needle was utilized for procedural planning. Next, an 11 gauge coaxial bone biopsy needle was advanced into the left iliac marrow space. Needle position was confirmed with CT imaging. Initially, bone marrow aspiration was performed. Next, a bone marrow biopsy was obtained with the 11 gauge outer bone marrow device. Samples were prepared with the cytotechnologist and deemed adequate. The needle was removed intact. Hemostasis was obtained with compression and a dressing was placed. The patient tolerated the  procedure well without immediate post procedural complication.  IMPRESSION: Successful CT guided left iliac bone marrow aspiration and core biopsies.   Electronically Signed   By: Sandi Mariscal M.D.   On: 03/19/2015 15:05    Scheduled Meds: . sodium chloride   Intravenous Once  . aspirin EC  81 mg Oral Weekly  . escitalopram  5 mg Oral q morning - 10a  . feeding supplement (ENSURE ENLIVE)  237 mL Oral BID BM  . fentaNYL      . midazolam      . omega-3 acid ethyl esters  1 g Oral q morning - 10a  . propranolol ER  80 mg Oral q morning - 10a  . rosuvastatin  10 mg Oral q morning - 10a    Continuous Infusions: . sodium chloride 10 mL/hr at 03/17/15 6438     Time spent: 15 min  [facility] Pager [phone]. If 7PM-7AM, please contact night-coverage at [email or web address], password [facility] 03/19/2015, 5:40 PM  LOS: 4 days

## 2015-03-19 NOTE — Progress Notes (Signed)
CRITICAL VALUE ALERT  Critical value received: WBC 0.8  Date of notification:  03/19/15  Time of notification:  0600  Critical value read back:Yes.    Nurse who received alert:  C. Eathel Pajak  MD notified (1st page):  K. Schorr  Time of first page:  614am  MD notified (2nd page):  Time of second page:  Responding MD:    Time MD responded:

## 2015-03-19 NOTE — Procedures (Signed)
Technically successful CT guided bone marrow aspiration and biopsy of left iliac crest. No immediate complications.    SignedSandi Mariscal Pager: [phone] 03/19/2015, 11:01 AM

## 2015-03-20 LAB — CBC
HCT: 26.4 % — ABNORMAL LOW (ref 36.0–46.0)
HEMOGLOBIN: 9.3 g/dL — AB (ref 12.0–15.0)
MCH: 31 pg (ref 26.0–34.0)
MCHC: 35.2 g/dL (ref 30.0–36.0)
MCV: 88 fL (ref 78.0–100.0)
PLATELETS: 93 10*3/uL — AB (ref 150–400)
RBC: 3 MIL/uL — AB (ref 3.87–5.11)
RDW: 13.7 % (ref 11.5–15.5)
WBC: 1.5 10*3/uL — AB (ref 4.0–10.5)

## 2015-03-20 LAB — PREPARE PLATELET PHERESIS
Unit division: 0
Unit division: 0

## 2015-03-20 LAB — BASIC METABOLIC PANEL
ANION GAP: 13 (ref 5–15)
BUN: 13 mg/dL (ref 6–20)
CHLORIDE: 97 mmol/L — AB (ref 101–111)
CO2: 23 mmol/L (ref 22–32)
Calcium: 8.4 mg/dL — ABNORMAL LOW (ref 8.9–10.3)
Creatinine, Ser: 1.09 mg/dL — ABNORMAL HIGH (ref 0.44–1.00)
GFR calc Af Amer: 55 mL/min — ABNORMAL LOW (ref >60)
GFR, EST NON AFRICAN AMERICAN: 48 mL/min — AB (ref >60)
GLUCOSE: 172 mg/dL — AB (ref 65–99)
POTASSIUM: 3.4 mmol/L — AB (ref 3.5–5.1)
Sodium: 133 mmol/L — ABNORMAL LOW (ref 135–145)

## 2015-03-20 MED ORDER — POTASSIUM CHLORIDE CRYS ER 20 MEQ PO TBCR
40.0000 meq | EXTENDED_RELEASE_TABLET | Freq: Once | ORAL | Status: AC
  Administered 2015-03-20: 40 meq via ORAL
  Filled 2015-03-20: qty 2

## 2015-03-20 NOTE — Progress Notes (Signed)
Physical Therapy Treatment Patient Details Name: [name] [name] MRN: [id number] DOB: [birth year] Today's Date: 03/20/2015    History of Present Illness [name] [name] is a 80 y.o. female and history of hypertension, chronic anemia thrombocytopenia and leukopenia with history of MGUS being followed by oncologist, previous history of breast cancer was brought to the ER the patient had 2 episodes of syncope. Both episodes happened within 1 hour interval. Both the time patient was walking. Patient states of recently patient has been feeling dizzy when she tries to stand up.  Tests show possible lung nodules with concern for metastatic process.  PMH of breast CA.    PT Comments    Patient is improving in functional mobility. Recommend HHPT for safety evaluation.  Follow Up Recommendations  Home health PT;Supervision/Assistance - 24 hour     Equipment Recommendations  Rolling walker with 5" wheels    Recommendations for Other Services       Precautions / Restrictions Precautions Precautions: Fall    Mobility  Bed Mobility Overal bed mobility: Independent                Transfers Overall transfer level: Modified independent               General transfer comment: reports that she is getting aroubnd the room without assistance.  Ambulation/Gait Ambulation/Gait assistance: Supervision Ambulation Distance (Feet): 400 Feet Assistive device: Rolling walker (2 wheeled) Gait Pattern/deviations: Step-through pattern     General Gait Details: used RW  for gait steadiness   Stairs            Wheelchair Mobility    Modified Rankin (Stroke Patients Only)       Balance     Sitting balance-Leahy Scale: Good       Standing balance-Leahy Scale: Fair                      Cognition Arousal/Alertness: Awake/alert Behavior During Therapy: WFL for tasks assessed/performed Overall Cognitive Status: Within Functional Limits for tasks assessed                      Exercises      General Comments        Pertinent Vitals/Pain Pain Assessment: No/denies pain    Home Living                      Prior Function            PT Goals (current goals can now be found in the care plan section) Progress towards PT goals: Progressing toward goals    Frequency  Min 3X/week    PT Plan Current plan remains appropriate    Co-evaluation             End of Session   Activity Tolerance: Patient tolerated treatment well Patient left: in bed (eating)     Time: 2122-4825 PT Time Calculation (min) (ACUTE ONLY): 16 min  Charges:  $Gait Training: 8-22 mins                    G Codes:      Claretha Cooper 03/20/2015, 1:18 PM Tresa Endo PT [phone]

## 2015-03-20 NOTE — Progress Notes (Signed)
With initial attempt to administer 2nd unit of platelets, a leak at the IV site was discovered. IV was order STAT. After IV team placed in a new IV site, platelets were administered immediately. Patient tolerated transfusion well.

## 2015-03-20 NOTE — Progress Notes (Signed)
PROGRESS NOTE  [name] [name] NIO:[id number] DOB: Apr 14, 1935 DOA: 03/14/2015 PCP: Jerlyn Ly, MD  HPI/Recap of past 79 hours: 80 year old female with past medical history of hypertension and mild pancytopenia plus distant history of breast cancer admitted on evening of 8/25 after a syncopal event. Patient had been not feeling well for the past few weeks and had very poor by mouth intake. In the emergency room, she was noted to be volume depleted requiring several liters of normal saline. She was noted to have acute renal failure with a creatinine of 1.81 (baseline near normal)  Following hydration, creatinine normalized. Syncope felt to be secondary to orthostatic hypotension brought on by poor by mouth intake and use of antihypertensives. During her workup, CT scan done to rule out PE noted multiple on her nodules bilaterally concerning for metastatic disease. Oncology notified. Patient underwent bone marrow biopsy today, 8/30.   Feeling well,awaiting decision for biopsy.   Assessment/Plan: MGUS (monoclonal gammopathy of unknown significance): Stable.  Syncope caused by orthostatic hypotension brought on by poor by mouth intake plus continued blood pressure medications. Patient's blood pressure responded nicely to IV fluids. Antihypertensives held. Suspect her poor by mouth intake is from background malignancy. Echocardiogram noted mild pericardial effusion plus diastolic dysfunction, but nothing significant. Patient seen by physical therapy who recommended home health PT which has been set up.  ARF (acute renal failure): Secondary to orthostatic hypotension. With IV fluids, creatinine normalized.  Abnormal troponins: Minimally elevated, however no higher than 0.12 and by day of discharge down to 0.03. No signs of any cardiac events. No chest pain. EKG unrevealing. Echocardiogram unrevealing in the setting of acute renal failure, likely etiology  Chronic leukopenia:  Oncology  following.  Thrombocytopenia: Remained stable.   Lung nodule/adrenal nodule/liver lesions: During workup, patient underwent CT scan of chest noting multiple pulmonary nodules as well as lesion seen in the liver and adrenal glands. This is certainly concerning for metastatic disease. Discussed with pulmonary and pulmonary nodules are not in locations amenable to bronchoscopy. Nodules are not in anyway affecting patient's respiratory status and she otherwise looks stable. Oncology has seen patient. IR recommended abdominal CT done 8/28 given for alternative areas to biopsy. Liver cysts only noted. Oncology followed up, patient status post bone marrow biopsy. CT-guided biopsy of either lung or adrenals awaiting oncologist recommendation.   Essential hypertension: continue with propanol.   Chronic diastolic heart failure: Incidentally noted on echocardiogram. Have stopped IV fluids. Will discontinue IV lasix,mildly increase cr.   Code Status: Full code  Family Communication: Had extensive discussion with family over the weekend   Disposition Plan: Anticipate discharge in nefew days after workup complete and outpatient plan set up               Consultants   interventional radiology  Oncology  Procedures:  2-D echocardiogram done 8/18: Grade 1 diastolic dysfunction  Antibiotics:  None   Objective: BP 132/54 mmHg  Pulse 72  Temp(Src) 97.7 F (36.5 C) (Oral)  Resp 16  Ht _0  (1.727 m)  Wt 64.774 kg (142 lb 12.8 oz)  BMI 21.72 kg/m2  SpO2 98%  Intake/Output Summary (Last 24 hours) at 03/20/15 1715 Last data filed at 03/20/15 1300  Gross per 24 hour  Intake    952 ml  Output   1100 ml  Net   -148 ml   Filed Weights   03/18/15 0643 03/19/15 0446 03/20/15 0517  Weight: 67.3 kg (148 lb 5.9 oz) 66.588 kg (146 lb 12.8  oz) 64.774 kg (142 lb 12.8 oz)    Exam:   General:  resting comfortably   Cardiovascular: Regular rate and rhythm  Respiratory: Clear to  auscultation bilaterally  Abdomen: Soft, nontender, nondistended, hypoactive bowel sounds  Musculoskeletal: No clubbing or cyanosis or edema   Data Reviewed: Basic Metabolic Panel:  Recent Labs Lab 03/15/15 1200 03/16/15 0505 03/18/15 0435 03/19/15 0508 03/20/15 0530  NA 130* 131* 129* 129* 133*  K 3.7 3.7 3.2* 3.3* 3.4*  CL 104 106 100* 98* 97*  CO2 19* 20* 20* 24 23  GLUCOSE 89 92 93 103* 172*  BUN _0 CREATININE 1.24* 1.02* 0.94 0.89 1.09*  CALCIUM 7.7* 7.7* 7.9* 7.9* 8.4*   Liver Function Tests:  Recent Labs Lab 03/14/15 1905 03/15/15 0605  AST 28 27  ALT 14 12*  ALKPHOS 60 55  BILITOT 0.6 0.3  PROT 6.2* 5.0*  ALBUMIN 3.3* 2.7*    Recent Labs Lab 03/14/15 1905  LIPASE 54*   No results for input(s): AMMONIA in the last 168 hours. CBC:  Recent Labs Lab 03/14/15 1905 03/15/15 0605 03/16/15 0505 03/18/15 0435 03/19/15 0508 03/20/15 0530  WBC 1.5* 1.1* 1.2* 1.2* 0.8* 1.5*  NEUTROABS 1.1* 0.8*  --   --   --   --   HGB 10.1* 9.4* 9.0* 9.1* 9.1* 9.3*  HCT 29.3* 27.5* 26.0* 26.1* 26.3* 26.4*  MCV 94.8 92.6 94.9 90.6 89.5 88.0  PLT 90* 65* 56* 64* 62* 93*   Cardiac Enzymes:    Recent Labs Lab 03/14/15 2340 03/15/15 0605 03/15/15 1140 03/15/15 2055 03/16/15 0505  TROPONINI <0.03 0.08* 0.12* 0.06* 0.03   BNP (last 3 results)  Recent Labs  03/14/15 1905  BNP 23.4    ProBNP (last 3 results) No results for input(s): PROBNP in the last 8760 hours.  CBG:  Recent Labs Lab 03/14/15 1832  GLUCAP 99    Recent Results (from the past 240 hour(s))  Culture, blood (routine x 2)     Status: None   Collection Time: 03/14/15  7:00 PM  Result Value Ref Range Status   Specimen Description BLOOD RIGHT HAND  Final   Special Requests BOTTLES DRAWN AEROBIC ONLY 7ML  Final   Culture   Final    NO GROWTH 5 DAYS Performed at [facility]    Report Status 03/19/2015 FINAL  Final  Culture, blood (routine x 2)     Status: None    Collection Time: 03/14/15  7:05 PM  Result Value Ref Range Status   Specimen Description BLOOD RIGHT ANTECUBITAL  Final   Special Requests BOTTLES DRAWN AEROBIC AND ANAEROBIC 5ML  Final   Culture   Final    NO GROWTH 5 DAYS Performed at [facility]    Report Status 03/19/2015 FINAL  Final     Studies: Mr Abdomen W Wo Contrast  03/20/2015   CLINICAL DATA:  Adrenal mass.  Right lower lobe lung lesion.  EXAM: MRI ABDOMEN WITHOUT AND WITH CONTRAST  TECHNIQUE: Multiplanar multisequence MR imaging of the abdomen was performed both before and after the administration of intravenous contrast.  CONTRAST:  57m MULTIHANCE GADOBENATE DIMEGLUMINE 529 MG/ML IV SOLN  COMPARISON:  Abdominal pelvic CT of 03/17/2015. Chest CT of 03/15/2015. History of breast cancer  FINDINGS: Portions of the exam, primarily the postcontrast dynamic series, are motion degraded.  Lower chest: Left larger than right small bilateral pleural effusions. Left breast implant. Small to moderate pericardial effusion. Right lower  lobe pulmonary nodule of 1.8 cm on image 5 of series 5. Left base atelectasis.  Hepatobiliary: Multiple T2 hyperintense well-circumscribed liver lesions are consistent with cysts. On post-contrast arterial phase imaging, there are numerous foci of hyper enhancement. Example series 13001. None of these areas are identified on later post-contrast images, and are favored to represent perfusion anomalies. Normal gallbladder, without biliary ductal dilatation.  Pancreas: Normal, without mass or ductal dilatation.  Spleen: Splenomegaly, greater than 17 cm craniocaudal.  Adrenals/Urinary Tract: Left larger than right adrenal masses. 3.2 x 2.3 cm on the left. 3.1 x 1.4 cm on the right. No signal dropout on out of phase imaging. Central necrosis as evidenced by hypo enhancement.  Bilateral renal cysts. medial interpolar 1.3 cm left renal lesion on image 39 of series 5 is likely hemorrhagic cyst; no post-contrast  enhancement. No hydronephrosis.  Stomach/Bowel: Normal stomach and abdominal bowel loops.  Vascular/Lymphatic: Advanced aortic atherosclerosis. Retroperitoneal nodes are prominent but not pathologic by size criteria. Right cardiophrenic angle node is upper normal at 6 mm on image 23 of series 13001.  Other: No ascites.  Musculoskeletal: Convex left lumbar spine curvature.  IMPRESSION: 1. Bilateral adrenal masses with necrosis, most consistent with metastatic disease. 2. Right lung base nodule, likely representing pulmonary metastasis. 3. Bilateral pleural and pericardial effusions. 4. No definite evidence of hepatic metastasis. Multiple hepatic cysts. Arterial foci of hyper enhancement are likely perfusion anomalies. Given the history of breast cancer, recommend attention on follow-up to exclude less likely tiny hypervascular metastasis. 5. Splenomegaly.   Electronically Signed   By: Abigail Miyamoto M.D.   On: 03/20/2015 08:30    Scheduled Meds: . sodium chloride   Intravenous Once  . aspirin EC  81 mg Oral Weekly  . escitalopram  5 mg Oral q morning - 10a  . feeding supplement (ENSURE ENLIVE)  237 mL Oral BID BM  . furosemide  20 mg Intravenous Daily  . omega-3 acid ethyl esters  1 g Oral q morning - 10a  . propranolol ER  80 mg Oral q morning - 10a  . rosuvastatin  10 mg Oral q morning - 10a    Continuous Infusions: . sodium chloride 10 mL/hr at 03/17/15 0829     Time spent: 15 min  [place] [place] [facility] Pager [phone]. If 7PM-7AM, please contact night-coverage at [email or web address], password [facility] 03/20/2015, 5:15 PM  LOS: 5 days

## 2015-03-20 NOTE — Progress Notes (Signed)
She had her bone marrow test yesterday.The results probably will not be back for 2 days. I put the order in for a lung biopsy area and I'm not sure 1 this is going to be done.  For some reason, she had platelets yesterday. They're supposed to be administered before her lung biopsy She had no problems with the transfusion.  This morning, her hemoglobin is 9.3. Her platelet count is 93,000. White cell counts 1.5.  She had the MRI of the abdomen yesterday. The results are not back yet.   She's had no bleeding. She's had no diarrhea. She's had no syncopal episodes.  All her vital signs are stable. Blood pressure 130/59. Pulse is 65. Temperature 97.5. Lungs are clear.  Cardiac exam regular rate and rhythm. Abdomen shows the splenomegaly about 1-2 cm below the left costal margin. There is no hepatomegaly. Extremities shows no clubbing, cyanosis or edema.  Again, the next that has to be a lung biopsy. She really needs to be an inpatient for this. She got platelets yesterday so hopefully a biopsy will be done tomorrow so that the platelets that she got will still be functional. We will await the results of the bone marrow biopsy.  I'm also awaiting the results of the labs that she had done a couple days ago.  As always, we will keep praying for her. Her faith is very strong.  [name] [name]  Psalm 119:1

## 2015-03-20 NOTE — Progress Notes (Addendum)
Patient ID: Saranya Harlin, female   DOB: [birth year], 80 y.o.   MRN: [id number]   IR aware of now adrenal gland biopsy request Dr Kathlene Cote has reviewed all imaging  Rt lung mass is small and has vessel coursing close/ and or into it. Very difficult and risk is high  Adrenal gland mass is possibly less risk but still not optimal. Rec: PET  Can be done as OP if needed  I will contact Dr Marin Olp to discuss this case with Dr Kathlene Cote   Addendum: I have discussed this with Dr Marin Olp He is aware of recommendation Agreeable

## 2015-03-21 ENCOUNTER — Other Ambulatory Visit: Payer: Self-pay | Admitting: Hematology & Oncology

## 2015-03-21 DIAGNOSIS — R918 Other nonspecific abnormal finding of lung field: Secondary | ICD-10-CM

## 2015-03-21 DIAGNOSIS — D472 Monoclonal gammopathy: Secondary | ICD-10-CM

## 2015-03-21 DIAGNOSIS — E279 Disorder of adrenal gland, unspecified: Secondary | ICD-10-CM

## 2015-03-21 DIAGNOSIS — R911 Solitary pulmonary nodule: Secondary | ICD-10-CM

## 2015-03-21 DIAGNOSIS — N179 Acute kidney failure, unspecified: Principal | ICD-10-CM

## 2015-03-21 LAB — BASIC METABOLIC PANEL
ANION GAP: 7 (ref 5–15)
BUN: 18 mg/dL (ref 6–20)
CHLORIDE: 98 mmol/L — AB (ref 101–111)
CO2: 29 mmol/L (ref 22–32)
CREATININE: 1.16 mg/dL — AB (ref 0.44–1.00)
Calcium: 8.4 mg/dL — ABNORMAL LOW (ref 8.9–10.3)
GFR calc non Af Amer: 44 mL/min — ABNORMAL LOW (ref >60)
GFR, EST AFRICAN AMERICAN: 51 mL/min — AB (ref >60)
Glucose, Bld: 107 mg/dL — ABNORMAL HIGH (ref 65–99)
POTASSIUM: 3.4 mmol/L — AB (ref 3.5–5.1)
SODIUM: 134 mmol/L — AB (ref 135–145)

## 2015-03-21 LAB — CBC
HEMATOCRIT: 25.4 % — AB (ref 36.0–46.0)
HEMOGLOBIN: 8.9 g/dL — AB (ref 12.0–15.0)
MCH: 32 pg (ref 26.0–34.0)
MCHC: 35 g/dL (ref 30.0–36.0)
MCV: 91.4 fL (ref 78.0–100.0)
Platelets: 98 10*3/uL — ABNORMAL LOW (ref 150–400)
RBC: 2.78 MIL/uL — AB (ref 3.87–5.11)
RDW: 13.9 % (ref 11.5–15.5)
WBC: 1.6 10*3/uL — ABNORMAL LOW (ref 4.0–10.5)

## 2015-03-21 LAB — UIFE/LIGHT CHAINS/TP QN, 24-HR UR
% BETA, Urine: 0 %
ALPHA 1 URINE: 0 %
ALPHA 2 UR: 0 %
Albumin, U: 100 %
FREE KAPPA/LAMBDA RATIO: 8.97 (ref 2.04–10.37)
FREE LT CHN EXCR RATE: 9.33 mg/L (ref 1.35–24.19)
Free Lambda Lt Chains,Ur: 1.04 mg/L (ref 0.24–6.66)
GAMMA GLOBULIN URINE: 0 %
Volume, Urine: 2450 mL

## 2015-03-21 MED ORDER — PROPRANOLOL HCL ER 60 MG PO CP24: 60.0000 mg | ORAL_CAPSULE | Freq: Every morning | ORAL | Status: DC

## 2015-03-21 MED ORDER — ENSURE ENLIVE PO LIQD: 237.0000 mL | Freq: Two times a day (BID) | ORAL | Status: AC

## 2015-03-21 NOTE — Progress Notes (Signed)
[name] [name] is doing okay. She hasn't no problems with syncope. Physical therapy has seen her. They recommended home health physical therapy.  I spoke with radiology yesterday. Today are very reluctant to do any biopsies right now. Apparently the lung lesion has a artery going through it. They are very irritating this. With her blood counts, they would prefer not to have to go and try to biopsy one of the adrenal lesions.  Her bone marrow biopsy results are not yet out. Hopefully they will be out today.  Her blood counts are holding pretty stable. She's been afebrile.  Her vital signs are all stable. Blood pressure 123/45. She is afebrile. Her lungs are clear. Cardiac exam regular rate and rhythm. Abdomen is soft. She has good bowel sounds. Spleen is still about 1-2 cm below the left costal margin. There is no hepatomegaly. Extremities shows no clubbing, cyanosis or edema. Skin exam shows no rashes.  Her CBC is relatively stable. Her hemoglobin is 8.9. White cell count is 1.6. Pelvic as 98,000. This is premature baseline for her.  Since a biopsy is not going to be done as an inpatient, she can be discharged from my point of view. Has not outpatient, we will get a PET scan on her and see what these lesions are with respect to PET activity.  Hopefully, the bone marrow report will be out later on today or tomorrow.  I will follow-up with her in the office after she has the PET scan done. We will then decide about any additional biopsies.  I appreciate all the great care that she's gotten up on 4 E.   [name].  Hebrews 12:28

## 2015-03-21 NOTE — Discharge Summary (Signed)
Physician Discharge Summary  [name] [name] UYQ:[id number] DOB: [birth year] DOA: 03/14/2015  PCP: Jerlyn Ly, MD  Admit date: 03/14/2015 Discharge date: 03/21/2015  Time spent: 35 minutes  Recommendations for Outpatient Follow-up:  1. Needs to follow up with Dr Marin Olp, needs PET scan 2. Needs CBC and Bmet to follow renal function and pancytopenia  Discharge Diagnoses:    MGUS (monoclonal gammopathy of unknown significance)   Syncope   Hypotension   ARF (acute renal failure)   Chronic leukopenia   Thrombocytopenia   Lung nodule   Adrenal nodule   Acute kidney injury   Chronic diastolic heart failure   Orthostatic hypotension   Pulmonary nodules/lesions, multiple   Syncope and collapse   Malnutrition of moderate degree   Adrenal mass   Discharge Condition: stable  Diet recommendation: regular diet  Filed Weights   03/19/15 0446 03/20/15 0517 03/21/15 0519  Weight: 66.588 kg (146 lb 12.8 oz) 64.774 kg (142 lb 12.8 oz) 64.501 kg (142 lb 3.2 oz)    History of present illness:  [name] [name] is a 80 y.o. female and history of hypertension, chronic anemia thrombocytopenia and leukopenia with history of MGUS being followed by oncologist, previous history of breast cancer was brought to the ER the patient had 2 episodes of syncope today. Both episodes happened within 1 hour interval. Both the time patient was walking. Patient states of recently patient has been feeling dizzy when she tries to stand up. Patient also was told by her primary care physician to decrease her spironolactone HCTZ due to low blood pressures a few months ago. Patient denies any nausea vomiting abdominal pain diarrhea chest pain or shortness of breath or cough or any fever chills. When the EMS arrived patient's blood pressure was in the 63O systolic. Patient also was hypotensive in the ER. Patient was given 2 L normal saline bolus following which patient blood pressure improved to 756 systolic. Patient was not  tachycardic or febrile. Patient is complaining of some pain in the right upper back for which CT chest was done without contrast which only shows lung nodule and small to moderate pericardial effusion and bilateral adrenal nodules with splenomegaly. Patient's d-dimer is significantly elevated but labs show acute renal failure. Patient will be admitted for further management. On my exam patient is not in distress. Patient states of recently patient has not been eating well and has poor appetite.  Hospital Course:  HPI/Recap of past 71 hours: 80 year old female with past medical history of hypertension and mild pancytopenia plus distant history of breast cancer admitted on evening of 8/25 after a syncopal event. Patient had been not feeling well for the past few weeks and had very poor by mouth intake. In the emergency room, she was noted to be volume depleted requiring several liters of normal saline. She was noted to have acute renal failure with a creatinine of 1.81 (baseline near normal)  Following hydration, creatinine normalized. Syncope felt to be secondary to orthostatic hypotension brought on by poor by mouth intake and use of antihypertensives. During her workup, CT scan done to rule out PE noted multiple on her nodules bilaterally concerning for metastatic disease. Oncology notified. Patient underwent bone marrow biopsy today, 8/30.   Feeling well,awaiting decision for biopsy.   Assessment/Plan: MGUS (monoclonal gammopathy of unknown significance): Stable.  Syncope caused by orthostatic hypotension brought on by poor by mouth intake plus continued blood pressure medications. Patient's blood pressure responded nicely to IV fluids. Antihypertensives held. Suspect her poor by mouth  intake is from background malignancy. Echocardiogram noted mild pericardial effusion plus diastolic dysfunction, but nothing significant. Patient seen by physical therapy who recommended home health PT which has been  set up.  ARF (acute renal failure): Secondary to orthostatic hypotension. With IV fluids, creatinine normalized.  Abnormal troponins: Minimally elevated, however no higher than 0.12 and by day of discharge down to 0.03. No signs of any cardiac events. No chest pain. EKG unrevealing. Echocardiogram unrevealing in the setting of acute renal failure, likely etiology  Chronic leukopenia: Oncology following.  Thrombocytopenia: Remained stable.   Lung nodule/adrenal nodule/liver lesions: During workup, patient underwent CT scan of chest noting multiple pulmonary nodules as well as lesion seen in the liver and adrenal glands. This is certainly concerning for metastatic disease. Discussed with pulmonary and pulmonary nodules are not in locations amenable to bronchoscopy. Nodules are not in anyway affecting patient's respiratory status and she otherwise looks stable. Oncology has seen patient. IR recommended abdominal CT done 8/28 given for alternative areas to biopsy. Liver cysts only noted. Oncology followed up, patient status post bone marrow biopsy. CT-guided biopsy of either lung or adrenals awaiting oncologist recommendation.   Essential hypertension: continue with propanol.   Chronic diastolic heart failure: Incidentally noted on echocardiogram. Have stopped IV fluids. Will discontinue IV lasix,mildly increase cr.   Procedures: Bone marrow biopsy  Consultations:  Dr Marin Olp  Discharge Exam: Filed Vitals:   03/21/15 0950  BP: 95/43  Pulse: 70  Temp:   Resp:     General: NAD Cardiovascular: S 1, S 2 RRR Respiratory: CTA  Discharge Instructions   Discharge Instructions    Diet - low sodium heart healthy    Complete by:  As directed      Diet - low sodium heart healthy    Complete by:  As directed      Increase activity slowly    Complete by:  As directed      Increase activity slowly    Complete by:  As directed           Current Discharge Medication List    START  taking these medications   Details  feeding supplement, ENSURE ENLIVE, (ENSURE ENLIVE) LIQD Take 237 mLs by mouth 2 (two) times daily between meals. Qty: 237 mL, Refills: 12      CONTINUE these medications which have CHANGED   Details  propranolol ER (INDERAL LA) 60 MG 24 hr capsule Take 1 capsule (60 mg total) by mouth every morning. Qty: 30 capsule, Refills: 0      CONTINUE these medications which have NOT CHANGED   Details  aspirin EC 81 MG tablet Take 81 mg by mouth once a week.     escitalopram (LEXAPRO) 10 MG tablet Take 5 mg by mouth every morning.     fish oil-omega-3 fatty acids 1000 MG capsule Take 2 g by mouth every morning.     methocarbamol (ROBAXIN) 500 MG tablet Take 500 mg by mouth every 8 (eight) hours as needed for muscle spasms.  Refills: 0   Associated Diagnoses: MGUS (monoclonal gammopathy of unknown significance)    Multiple Vitamin (MULTIVITAMIN) capsule Take 1 capsule by mouth every morning.     raloxifene (EVISTA) 60 MG tablet Take 60 mg by mouth every morning.     rosuvastatin (CRESTOR) 20 MG tablet Take 10 mg by mouth every morning.     Vitamin D, Ergocalciferol, (DRISDOL) 50000 UNITS CAPS Take 50,000 Units by mouth every Monday.  STOP taking these medications     benazepril (LOTENSIN) 20 MG tablet      ibuprofen (ADVIL,MOTRIN) 200 MG tablet      spironolactone-hydrochlorothiazide (ALDACTAZIDE) 25-25 MG per tablet        No Known Allergies Follow-up Information    Follow up with [facility].   Why:  They will call you for date and time of your biopsy appointment   Contact information:   [place] [phone]      Follow up with [facility].   Specialty:  Home Health Services   Why:  home health physical therapy   Contact information:   [place] [address] [phone]       Follow up with [facility].    Why:  rw   Contact information:   [address] [place] [address] [phone]       Follow up with Crist Infante A, MD In 1 week.   Specialty:  Internal Medicine   Contact information:   [place] [address] [phone]       Follow up with Volanda Napoleon, MD In 1 week.   Specialty:  Oncology   Contact information:   [place], [address] [place] [address] [phone]        The results of significant diagnostics from this hospitalization (including imaging, microbiology, ancillary and laboratory) are listed below for reference.    Significant Diagnostic Studies: Ct Head Wo Contrast  03/14/2015   CLINICAL DATA:  [place] backwards today.  Loss of consciousness.  EXAM: CT HEAD WITHOUT CONTRAST  TECHNIQUE: Contiguous axial images were obtained from the base of the skull through the vertex without intravenous contrast.  COMPARISON:  09/12/2010.  FINDINGS: Diffusely enlarged ventricles and subarachnoid spaces. No skull fracture, intracranial hemorrhage or paranasal sinus air-fluid levels.  IMPRESSION: Mild atrophy.  No acute abnormality.   Electronically Signed   By: Claudie Revering M.D.   On: 03/14/2015 19:32   Ct Chest Wo Contrast  03/14/2015   CLINICAL DATA:  Multiple episodes of syncope today. Dizziness when changing from sitting to standing. Right-sided chest pain and shortness of breath. Falls.  EXAM: CT CHEST WITHOUT CONTRAST  TECHNIQUE: Multidetector CT imaging of the chest was performed following the standard protocol without IV contrast.  COMPARISON:  None.  FINDINGS: Normal heart size. Calcification in the Coronary arteries, mitral valve annulus, and aortic valve as well as in the aorta. Small to moderate pericardial effusion. Normal caliber thoracic aorta. Scattered mediastinal lymph nodes are not pathologically enlarged. Esophagus is decompressed.  1.8 cm diameter nodule in the right lower lung. This is indeterminate and primary or metastatic  disease needs to be excluded. No other focal lung lesions. No focal consolidation or airspace disease. No pneumothorax. No pleural effusions.  Left breast reconstruction. Prominent adrenal gland nodules bilaterally, right measuring 4.8 x 1.9 cm and left measuring 2.4 x 4.7 cm. Spleen is enlarged but appears homogeneous. Multiple low-attenuation lesions in the liver are likely represent cysts but given the presence of other possible metastatic lesions, further evaluation with contrast-enhanced CT or MRI is suggested. Degenerative changes in the spine. No destructive bone lesions appreciated.  IMPRESSION: 1.8 cm diameter right lower lung nodule and bilateral adrenal gland nodules suspicious for metastatic disease. Multiple low-attenuation lesions in the liver are probably cysts but further characterization is suggested with either contrast-enhanced CT or MRI. Small to  moderate pericardial effusion. Mild splenic enlargement.   Electronically Signed   By: Lucienne Capers M.D.   On: 03/14/2015 22:07   Ct Angio Chest Pe W/cm &/or Wo Cm  03/15/2015   CLINICAL DATA:  History of breast carcinoma with recent syncopal episode and back pain  EXAM: CT ANGIOGRAPHY CHEST WITH CONTRAST  TECHNIQUE: Multidetector CT imaging of the chest was performed using the standard protocol during bolus administration of intravenous contrast. Multiplanar CT image reconstructions and MIPs were obtained to evaluate the vascular anatomy.  CONTRAST:  18m OMNIPAQUE IOHEXOL 350 MG/ML SOLN  COMPARISON:  None.  FINDINGS: The lungs are well aerated bilaterally with small left-sided pleural effusion and left basilar atelectasis. Single calcified granuloma is noted in the left lung. Scattered pulmonary nodules are identified throughout the lungs bilaterally. The largest of these lies in the right lower lobe best seen on image number 66 of series 8 measuring 19 mm in greatest dimension. A 6-7 mm nodule is noted in the left lower lobe best seen on image  number 54 of series 8 as well as a tiny 2-3 mm nodule in the left apex best seen on image number 13 of series 8. Given the patient's clinical history these are consistent with metastatic foci.  The thoracic inlet is within normal limits. The thoracic aorta shows mild calcifications without aneurysmal dilatation or dissection. A pericardial effusion is noted. This measures approximately 2.3 cm in greatest thickness posteriorly. Mild coronary calcifications are noted. The pulmonary artery is well visualized and demonstrates a normal branching pattern. No filling defects to suggest pulmonary emboli are identified. No hilar or mediastinal adenopathy is noted. Some thickening of the midesophagus is noted just above the level of the carina best seen on image number 97 of series 7.  Changes consistent with breast implant are noted on the left. This is consistent with the patient's given clinical history of carcinoma of the breast. Postsurgical changes in the axilla are seen. No axillary adenopathy is identified. The upper abdomen again demonstrates hypodensities within the liver similar to that seen on prior CT. Adrenal lesions are again noted bilaterally.  Review of the MIP images confirms the above findings.  IMPRESSION: Changes consistent with metastatic disease within the adrenal glands bilaterally as well as the lungs bilaterally.  Multiple hypodensities are again seen within liver which may represent metastatic disease. Further evaluation is recommended.  No evidence of pulmonary embolism.  Pericardial effusion.  Thickening of the midesophagus of uncertain significance.   Electronically Signed   By: MInez CatalinaM.D.   On: 03/15/2015 10:05   Mr Abdomen W Wo Contrast  03/20/2015   CLINICAL DATA:  Adrenal mass.  Right lower lobe lung lesion.  EXAM: MRI ABDOMEN WITHOUT AND WITH CONTRAST  TECHNIQUE: Multiplanar multisequence MR imaging of the abdomen was performed both before and after the administration of  intravenous contrast.  CONTRAST:  178mMULTIHANCE GADOBENATE DIMEGLUMINE 529 MG/ML IV SOLN  COMPARISON:  Abdominal pelvic CT of 03/17/2015. Chest CT of 03/15/2015. History of breast cancer  FINDINGS: Portions of the exam, primarily the postcontrast dynamic series, are motion degraded.  Lower chest: Left larger than right small bilateral pleural effusions. Left breast implant. Small to moderate pericardial effusion. Right lower lobe pulmonary nodule of 1.8 cm on image 5 of series 5. Left base atelectasis.  Hepatobiliary: Multiple T2 hyperintense well-circumscribed liver lesions are consistent with cysts. On post-contrast arterial phase imaging, there are numerous foci of hyper enhancement. Example series 13001. None of these  areas are identified on later post-contrast images, and are favored to represent perfusion anomalies. Normal gallbladder, without biliary ductal dilatation.  Pancreas: Normal, without mass or ductal dilatation.  Spleen: Splenomegaly, greater than 17 cm craniocaudal.  Adrenals/Urinary Tract: Left larger than right adrenal masses. 3.2 x 2.3 cm on the left. 3.1 x 1.4 cm on the right. No signal dropout on out of phase imaging. Central necrosis as evidenced by hypo enhancement.  Bilateral renal cysts. medial interpolar 1.3 cm left renal lesion on image 39 of series 5 is likely hemorrhagic cyst; no post-contrast enhancement. No hydronephrosis.  Stomach/Bowel: Normal stomach and abdominal bowel loops.  Vascular/Lymphatic: Advanced aortic atherosclerosis. Retroperitoneal nodes are prominent but not pathologic by size criteria. Right cardiophrenic angle node is upper normal at 6 mm on image 23 of series 13001.  Other: No ascites.  Musculoskeletal: Convex left lumbar spine curvature.  IMPRESSION: 1. Bilateral adrenal masses with necrosis, most consistent with metastatic disease. 2. Right lung base nodule, likely representing pulmonary metastasis. 3. Bilateral pleural and pericardial effusions. 4. No  definite evidence of hepatic metastasis. Multiple hepatic cysts. Arterial foci of hyper enhancement are likely perfusion anomalies. Given the history of breast cancer, recommend attention on follow-up to exclude less likely tiny hypervascular metastasis. 5. Splenomegaly.   Electronically Signed   By: Abigail Miyamoto M.D.   On: 03/20/2015 08:30   Ct Abdomen Pelvis W Contrast  03/17/2015   CLINICAL DATA:  Pulmonary metastases, adrenal metastases, history of LEFT breast cancer 1981 post mastectomy and lymph node dissection  EXAM: CT ABDOMEN AND PELVIS WITH CONTRAST  TECHNIQUE: Multidetector CT imaging of the abdomen and pelvis was performed using the standard protocol following bolus administration of intravenous contrast. Sagittal and coronal MPR images reconstructed from axial data set.  CONTRAST:  112m OMNIPAQUE IOHEXOL 300 MG/ML SOLN IV. Dilute oral contrast.  COMPARISON:  None; correlation CT chest 03/15/2015  FINDINGS: LEFT breast prosthesis.  Small BILATERAL pleural effusions and bibasilar atelectasis.  Small to moderate pericardial effusion.  RIGHT lower lobe mass 19 x 16 mm image 9.  Multiple low-attenuation foci within liver likely cysts, largest lateral segment LEFT lobe 2.3 x 1.8 cm.  Splenic enlargement, spleen measuring 17.9 x 13.0 x 6.7 cm.  BILATERAL adrenal masses, 4.3 x 2.8 cm LEFT and 4.3 x 2.1 cm RIGHT.  Tiny BILATERAL renal cysts.  Remainder of liver, pancreas, and adrenal glands normal.  Normal appendix.  Questionable rectal wall thickening versus artifact near anus.  Mild wall thickening at gastric cardia, unchanged from CT chest, question gastric neoplasm.  Remainder of stomach, colon and small bowel loops normal.  Unremarkable bladder, ureters, uterus and adnexa.  Free intraperitoneal fluid in pelvis.  Some additional mass, adenopathy, free air or hernia.  Scattered atherosclerotic calcifications.  Normal size retroperitoneal nodes.  Bones demineralized.  Schmorl's node superior endplate T10.   Bones demineralized.  IMPRESSION: RIGHT lower lobe mass 19 x 16 mm question primary versus metastatic focus.  BILATERAL adrenal masses concerning for metastases.  Significant splenic enlargement without focal lesion.  Gastric wall thickening at cardia, tumor not excluded; endoscopic evaluation recommended to exclude gastric neoplasm.  Inferior rectal wall thickening versus artifact, recommend correlation with digital rectal exam and proctoscopy to exclude mass.  Ascites.   Electronically Signed   By: MLavonia DanaM.D.   On: 03/17/2015 11:00   Ct Biopsy  03/19/2015   INDICATION: Pancytopenia. Please perform CT guided bone marrow biopsy for tissue diagnostic purposes  EXAM: CT GUIDED BONE MARROW BIOPSY  AND ASPIRATION.  MEDICATIONS: Fentanyl 50 mcg IV; Versed 1 mg IV  ANESTHESIA/SEDATION: Sedation Time  10 minutes  CONTRAST:  None  COMPLICATIONS: None immediate.  PROCEDURE: Informed consent was obtained from the patient following an explanation of the procedure, risks, benefits and alternatives. The patient understands, agrees and consents for the procedure. All questions were addressed. A time out was performed prior to the initiation of the procedure. The patient was positioned prone and non-contrast localization CT was performed of the pelvis to demonstrate the iliac marrow spaces. The operative site was prepped and draped in the usual sterile fashion.  Under sterile conditions and local anesthesia, a 22 gauge spinal needle was utilized for procedural planning. Next, an 11 gauge coaxial bone biopsy needle was advanced into the left iliac marrow space. Needle position was confirmed with CT imaging. Initially, bone marrow aspiration was performed. Next, a bone marrow biopsy was obtained with the 11 gauge outer bone marrow device. Samples were prepared with the cytotechnologist and deemed adequate. The needle was removed intact. Hemostasis was obtained with compression and a dressing was placed. The patient tolerated  the procedure well without immediate post procedural complication.  IMPRESSION: Successful CT guided left iliac bone marrow aspiration and core biopsies.   Electronically Signed   By: Sandi Mariscal M.D.   On: 03/19/2015 15:05   Dg Chest Port 1 View  03/14/2015   CLINICAL DATA:  Status post fall.  History of breast cancer.  EXAM: PORTABLE CHEST - 1 VIEW  COMPARISON:  June 23, 2012  FINDINGS: The heart size and mediastinal contours are stable. There is no focal infiltrate, pulmonary edema, or pleural effusion. Left axillary surgical clips are identified unchanged. The visualized skeletal structures are stable.  IMPRESSION: No active cardiopulmonary disease.   Electronically Signed   By: Abelardo Diesel M.D.   On: 03/14/2015 18:45    Microbiology: Recent Results (from the past 240 hour(s))  Culture, blood (routine x 2)     Status: None   Collection Time: 03/14/15  7:00 PM  Result Value Ref Range Status   Specimen Description BLOOD RIGHT HAND  Final   Special Requests BOTTLES DRAWN AEROBIC ONLY 7ML  Final   Culture   Final    NO GROWTH 5 DAYS Performed at [facility]    Report Status 03/19/2015 FINAL  Final  Culture, blood (routine x 2)     Status: None   Collection Time: 03/14/15  7:05 PM  Result Value Ref Range Status   Specimen Description BLOOD RIGHT ANTECUBITAL  Final   Special Requests BOTTLES DRAWN AEROBIC AND ANAEROBIC 5ML  Final   Culture   Final    NO GROWTH 5 DAYS Performed at [facility]    Report Status 03/19/2015 FINAL  Final     Labs: Basic Metabolic Panel:  Recent Labs Lab 03/16/15 0505 03/18/15 0435 03/19/15 0508 03/20/15 0530 03/21/15 0549  NA 131* 129* 129* 133* 134*  K 3.7 3.2* 3.3* 3.4* 3.4*  CL 106 100* 98* 97* 98*  CO2 20* 20* 24 23 29   GLUCOSE 92 93 103* 172* 107*  BUN 9 6 6 13 18   CREATININE 1.02* 0.94 0.89 1.09* 1.16*  CALCIUM 7.7* 7.9* 7.9* 8.4* 8.4*   Liver Function Tests:  Recent Labs Lab 03/14/15 1905 03/15/15 0605  AST  28 27  ALT 14 12*  ALKPHOS 60 55  BILITOT 0.6 0.3  PROT 6.2* 5.0*  ALBUMIN 3.3* 2.7*    Recent Labs Lab 03/14/15  1905  LIPASE 54*   No results for input(s): AMMONIA in the last 168 hours. CBC:  Recent Labs Lab 03/14/15 1905 03/15/15 6270 03/16/15 0505 03/18/15 0435 03/19/15 0508 03/20/15 0530 03/21/15 0549  WBC 1.5* 1.1* 1.2* 1.2* 0.8* 1.5* 1.6*  NEUTROABS 1.1* 0.8*  --   --   --   --   --   HGB 10.1* 9.4* 9.0* 9.1* 9.1* 9.3* 8.9*  HCT 29.3* 27.5* 26.0* 26.1* 26.3* 26.4* 25.4*  MCV 94.8 92.6 94.9 90.6 89.5 88.0 91.4  PLT 90* 65* 56* 64* 62* 93* 98*   Cardiac Enzymes:  Recent Labs Lab 03/14/15 2340 03/15/15 0605 03/15/15 1140 03/15/15 2055 03/16/15 0505  TROPONINI <0.03 0.08* 0.12* 0.06* 0.03   BNP: BNP (last 3 results)  Recent Labs  03/14/15 1905  BNP 23.4    ProBNP (last 3 results) No results for input(s): PROBNP in the last 8760 hours.  CBG:  Recent Labs Lab 03/14/15 1832  GLUCAP 99       Signed:  Sheniece Ruggles A  [facility] 03/21/2015, 11:23 AM

## 2015-03-21 NOTE — Care Management Note (Signed)
Case Management Note  Patient Details  Name: [name] [name] MRN: [id number] Date of Birth: April 09, 1935  Subjective/Objective:   Confirmed w/patient in rm while patient's dtr, & sister that patient chose [facility] for [place].Informed [facility] rep Cyril Mourning of d/c & HHC.RW already in rm.                 Action/Plan:d/c home w/HHC/DME.   Expected Discharge Date:                Expected Discharge Plan:  [place]  In-House Referral:     Discharge planning Services  CM Consult  Post Acute Care Choice:  Home Health Choice offered to:  Patient  DME Arranged:  N/A, [place] rolling DME Agency:  [place]:  PT [place]:  [place]  Status of Service:  Completed, signed off  Medicare Important Message Given:  Yes-second notification given Date Medicare IM Given:    Medicare IM give by:    Date Additional Medicare IM Given:    Additional Medicare Important Message give by:     If discussed at [place] of Stay Meetings, dates discussed:    Additional Comments:  Dessa Phi, RN 03/21/2015, 12:24 PM

## 2015-03-22 ENCOUNTER — Ambulatory Visit: Payer: Medicare Other

## 2015-03-27 LAB — CHROMOSOME ANALYSIS, BONE MARROW

## 2015-03-30 ENCOUNTER — Emergency Department (HOSPITAL_COMMUNITY): Payer: Medicare Other

## 2015-03-30 ENCOUNTER — Inpatient Hospital Stay (HOSPITAL_COMMUNITY)
Admission: EM | Admit: 2015-03-30 | Discharge: 2015-04-12 | DRG: 799 | Disposition: A | Discharge status: Skilled Nursing Facility | Payer: Medicare Other | Attending: Internal Medicine | Admitting: Internal Medicine

## 2015-03-30 ENCOUNTER — Emergency Department (HOSPITAL_COMMUNITY)
Admission: EM | Admit: 2015-03-30 | Discharge: 2015-03-30 | Disposition: A | Discharge status: Home / Self Care | Payer: Medicare Other

## 2015-03-30 ENCOUNTER — Encounter (HOSPITAL_COMMUNITY): Payer: Self-pay | Admitting: Nurse Practitioner

## 2015-03-30 DIAGNOSIS — Z23 Encounter for immunization: Secondary | ICD-10-CM

## 2015-03-30 DIAGNOSIS — D472 Monoclonal gammopathy: Secondary | ICD-10-CM | POA: Diagnosis present

## 2015-03-30 DIAGNOSIS — Z79899 Other long term (current) drug therapy: Secondary | ICD-10-CM

## 2015-03-30 DIAGNOSIS — D649 Anemia, unspecified: Secondary | ICD-10-CM | POA: Insufficient documentation

## 2015-03-30 DIAGNOSIS — E2740 Unspecified adrenocortical insufficiency: Secondary | ICD-10-CM | POA: Diagnosis present

## 2015-03-30 DIAGNOSIS — Z833 Family history of diabetes mellitus: Secondary | ICD-10-CM | POA: Diagnosis not present

## 2015-03-30 DIAGNOSIS — D72829 Elevated white blood cell count, unspecified: Secondary | ICD-10-CM | POA: Diagnosis not present

## 2015-03-30 DIAGNOSIS — K5900 Constipation, unspecified: Secondary | ICD-10-CM | POA: Diagnosis not present

## 2015-03-30 DIAGNOSIS — E46 Unspecified protein-calorie malnutrition: Secondary | ICD-10-CM | POA: Diagnosis present

## 2015-03-30 DIAGNOSIS — R911 Solitary pulmonary nodule: Secondary | ICD-10-CM | POA: Diagnosis present

## 2015-03-30 DIAGNOSIS — R918 Other nonspecific abnormal finding of lung field: Secondary | ICD-10-CM | POA: Diagnosis present

## 2015-03-30 DIAGNOSIS — E785 Hyperlipidemia, unspecified: Secondary | ICD-10-CM | POA: Diagnosis present

## 2015-03-30 DIAGNOSIS — Z9081 Acquired absence of spleen: Secondary | ICD-10-CM

## 2015-03-30 DIAGNOSIS — I313 Pericardial effusion (noninflammatory): Secondary | ICD-10-CM | POA: Diagnosis present

## 2015-03-30 DIAGNOSIS — E872 Acidosis: Secondary | ICD-10-CM | POA: Diagnosis present

## 2015-03-30 DIAGNOSIS — E871 Hypo-osmolality and hyponatremia: Secondary | ICD-10-CM | POA: Diagnosis present

## 2015-03-30 DIAGNOSIS — D62 Acute posthemorrhagic anemia: Secondary | ICD-10-CM | POA: Diagnosis not present

## 2015-03-30 DIAGNOSIS — E279 Disorder of adrenal gland, unspecified: Secondary | ICD-10-CM | POA: Diagnosis present

## 2015-03-30 DIAGNOSIS — D61818 Other pancytopenia: Secondary | ICD-10-CM | POA: Diagnosis present

## 2015-03-30 DIAGNOSIS — I3139 Other pericardial effusion (noninflammatory): Secondary | ICD-10-CM

## 2015-03-30 DIAGNOSIS — Z9012 Acquired absence of left breast and nipple: Secondary | ICD-10-CM | POA: Diagnosis present

## 2015-03-30 DIAGNOSIS — R531 Weakness: Secondary | ICD-10-CM | POA: Diagnosis present

## 2015-03-30 DIAGNOSIS — I2510 Atherosclerotic heart disease of native coronary artery without angina pectoris: Secondary | ICD-10-CM | POA: Diagnosis present

## 2015-03-30 DIAGNOSIS — R627 Adult failure to thrive: Secondary | ICD-10-CM | POA: Diagnosis present

## 2015-03-30 DIAGNOSIS — F329 Major depressive disorder, single episode, unspecified: Secondary | ICD-10-CM | POA: Diagnosis present

## 2015-03-30 DIAGNOSIS — E440 Moderate protein-calorie malnutrition: Secondary | ICD-10-CM | POA: Diagnosis present

## 2015-03-30 DIAGNOSIS — E559 Vitamin D deficiency, unspecified: Secondary | ICD-10-CM | POA: Diagnosis present

## 2015-03-30 DIAGNOSIS — R161 Splenomegaly, not elsewhere classified: Secondary | ICD-10-CM | POA: Diagnosis present

## 2015-03-30 DIAGNOSIS — Z800 Family history of malignant neoplasm of digestive organs: Secondary | ICD-10-CM

## 2015-03-30 DIAGNOSIS — I10 Essential (primary) hypertension: Secondary | ICD-10-CM | POA: Diagnosis not present

## 2015-03-30 DIAGNOSIS — G9341 Metabolic encephalopathy: Secondary | ICD-10-CM | POA: Diagnosis present

## 2015-03-30 DIAGNOSIS — I5032 Chronic diastolic (congestive) heart failure: Secondary | ICD-10-CM | POA: Diagnosis present

## 2015-03-30 DIAGNOSIS — Z7982 Long term (current) use of aspirin: Secondary | ICD-10-CM

## 2015-03-30 DIAGNOSIS — R4701 Aphasia: Secondary | ICD-10-CM | POA: Diagnosis present

## 2015-03-30 DIAGNOSIS — R197 Diarrhea, unspecified: Secondary | ICD-10-CM | POA: Diagnosis present

## 2015-03-30 DIAGNOSIS — R490 Dysphonia: Secondary | ICD-10-CM | POA: Diagnosis not present

## 2015-03-30 DIAGNOSIS — Z853 Personal history of malignant neoplasm of breast: Secondary | ICD-10-CM

## 2015-03-30 DIAGNOSIS — E278 Other specified disorders of adrenal gland: Secondary | ICD-10-CM | POA: Diagnosis present

## 2015-03-30 DIAGNOSIS — R4781 Slurred speech: Secondary | ICD-10-CM | POA: Diagnosis present

## 2015-03-30 DIAGNOSIS — Z87891 Personal history of nicotine dependence: Secondary | ICD-10-CM

## 2015-03-30 DIAGNOSIS — I2584 Coronary atherosclerosis due to calcified coronary lesion: Secondary | ICD-10-CM | POA: Diagnosis not present

## 2015-03-30 DIAGNOSIS — F32A Depression, unspecified: Secondary | ICD-10-CM | POA: Diagnosis present

## 2015-03-30 DIAGNOSIS — Z85828 Personal history of other malignant neoplasm of skin: Secondary | ICD-10-CM

## 2015-03-30 DIAGNOSIS — Z6821 Body mass index (BMI) 21.0-21.9, adult: Secondary | ICD-10-CM | POA: Diagnosis not present

## 2015-03-30 DIAGNOSIS — M810 Age-related osteoporosis without current pathological fracture: Secondary | ICD-10-CM | POA: Diagnosis present

## 2015-03-30 DIAGNOSIS — D473 Essential (hemorrhagic) thrombocythemia: Secondary | ICD-10-CM | POA: Diagnosis not present

## 2015-03-30 DIAGNOSIS — R479 Unspecified speech disturbances: Secondary | ICD-10-CM

## 2015-03-30 DIAGNOSIS — E860 Dehydration: Secondary | ICD-10-CM | POA: Diagnosis present

## 2015-03-30 DIAGNOSIS — E119 Type 2 diabetes mellitus without complications: Secondary | ICD-10-CM | POA: Diagnosis present

## 2015-03-30 DIAGNOSIS — R4182 Altered mental status, unspecified: Secondary | ICD-10-CM | POA: Diagnosis not present

## 2015-03-30 DIAGNOSIS — Z01818 Encounter for other preprocedural examination: Secondary | ICD-10-CM | POA: Diagnosis not present

## 2015-03-30 DIAGNOSIS — I110 Hypertensive heart disease with heart failure: Secondary | ICD-10-CM | POA: Diagnosis present

## 2015-03-30 DIAGNOSIS — I319 Disease of pericardium, unspecified: Secondary | ICD-10-CM | POA: Diagnosis present

## 2015-03-30 LAB — URINALYSIS, ROUTINE W REFLEX MICROSCOPIC
Glucose, UA: NEGATIVE mg/dL
Hgb urine dipstick: NEGATIVE
LEUKOCYTES UA: NEGATIVE
NITRITE: NEGATIVE
PH: 6 (ref 5.0–8.0)
PROTEIN: NEGATIVE mg/dL
Specific Gravity, Urine: 1.025 (ref 1.005–1.030)
Urobilinogen, UA: 1 mg/dL (ref 0.0–1.0)

## 2015-03-30 LAB — CBC
HEMATOCRIT: 29.1 % — AB (ref 36.0–46.0)
HEMOGLOBIN: 10.2 g/dL — AB (ref 12.0–15.0)
MCH: 32.5 pg (ref 26.0–34.0)
MCHC: 35.1 g/dL (ref 30.0–36.0)
MCV: 92.7 fL (ref 78.0–100.0)
Platelets: 129 10*3/uL — ABNORMAL LOW (ref 150–400)
RBC: 3.14 MIL/uL — ABNORMAL LOW (ref 3.87–5.11)
RDW: 14.1 % (ref 11.5–15.5)
WBC: 2.1 10*3/uL — ABNORMAL LOW (ref 4.0–10.5)

## 2015-03-30 LAB — DIFFERENTIAL
BASOS ABS: 0 10*3/uL (ref 0.0–0.1)
BASOS PCT: 1 % (ref 0–1)
EOS ABS: 0.1 10*3/uL (ref 0.0–0.7)
EOS PCT: 3 % (ref 0–5)
LYMPHS ABS: 0.2 10*3/uL — AB (ref 0.7–4.0)
Lymphocytes Relative: 10 % — ABNORMAL LOW (ref 12–46)
MONO ABS: 0.2 10*3/uL (ref 0.1–1.0)
MONOS PCT: 9 % (ref 3–12)
Neutro Abs: 1.6 10*3/uL — ABNORMAL LOW (ref 1.7–7.7)
Neutrophils Relative %: 77 % (ref 43–77)

## 2015-03-30 LAB — I-STAT TROPONIN, ED: TROPONIN I, POC: 0.02 ng/mL (ref 0.00–0.08)

## 2015-03-30 LAB — COMPREHENSIVE METABOLIC PANEL
ALT: 11 U/L — ABNORMAL LOW (ref 14–54)
AST: 31 U/L (ref 15–41)
Albumin: 3.1 g/dL — ABNORMAL LOW (ref 3.5–5.0)
Alkaline Phosphatase: 116 U/L (ref 38–126)
Anion gap: 13 (ref 5–15)
BILIRUBIN TOTAL: 1.5 mg/dL — AB (ref 0.3–1.2)
BUN: 16 mg/dL (ref 6–20)
CHLORIDE: 95 mmol/L — AB (ref 101–111)
CO2: 21 mmol/L — ABNORMAL LOW (ref 22–32)
Calcium: 8.9 mg/dL (ref 8.9–10.3)
Creatinine, Ser: 0.93 mg/dL (ref 0.44–1.00)
GFR, EST NON AFRICAN AMERICAN: 57 mL/min — AB (ref >60)
Glucose, Bld: 90 mg/dL (ref 65–99)
POTASSIUM: 3.7 mmol/L (ref 3.5–5.1)
Sodium: 129 mmol/L — ABNORMAL LOW (ref 135–145)
TOTAL PROTEIN: 6.5 g/dL (ref 6.5–8.1)

## 2015-03-30 LAB — RAPID URINE DRUG SCREEN, HOSP PERFORMED
Amphetamines: NOT DETECTED
BARBITURATES: NOT DETECTED
Benzodiazepines: NOT DETECTED
COCAINE: NOT DETECTED
Opiates: NOT DETECTED
Tetrahydrocannabinol: NOT DETECTED

## 2015-03-30 LAB — I-STAT CHEM 8, ED
BUN: 15 mg/dL (ref 6–20)
CALCIUM ION: 1.13 mmol/L (ref 1.13–1.30)
Chloride: 94 mmol/L — ABNORMAL LOW (ref 101–111)
Creatinine, Ser: 1.1 mg/dL — ABNORMAL HIGH (ref 0.44–1.00)
Glucose, Bld: 88 mg/dL (ref 65–99)
HEMATOCRIT: 30 % — AB (ref 36.0–46.0)
HEMOGLOBIN: 10.2 g/dL — AB (ref 12.0–15.0)
Potassium: 3.6 mmol/L (ref 3.5–5.1)
SODIUM: 130 mmol/L — AB (ref 135–145)
TCO2: 19 mmol/L (ref 0–100)

## 2015-03-30 LAB — PROTIME-INR
INR: 1.34 (ref 0.00–1.49)
Prothrombin Time: 16.7 seconds — ABNORMAL HIGH (ref 11.6–15.2)

## 2015-03-30 LAB — ETHANOL

## 2015-03-30 LAB — APTT: aPTT: 28 seconds (ref 24–37)

## 2015-03-30 MED ORDER — SODIUM CHLORIDE 0.9 % IV BOLUS (SEPSIS)
1000.0000 mL | Freq: Once | INTRAVENOUS | Status: AC
  Administered 2015-03-30: 1000 mL via INTRAVENOUS

## 2015-03-30 NOTE — ED Notes (Signed)
Bed: XI33 Expected date: 03/30/15 Expected time: 6:37 PM Means of arrival: Ambulance Comments: EMS warm to touch, weakness

## 2015-03-30 NOTE — ED Provider Notes (Signed)
CSN: [id number]     Arrival date & time 03/30/15  1835 History   First MD Initiated Contact with Patient 03/30/15 1902     Chief Complaint  Patient presents with  . Weakness  . Not Eating      (Consider location/radiation/quality/duration/timing/severity/associated sxs/prior Treatment) HPI Comments: Patient presents to the emergency department with chief complaint of weakness, slurred speech, and anorexia. Patient has been in and out of the hospital recently for weakness. Patient is accompanied by family members, who stated the patient was recently admitted.  Patient's family member states that after she was discharged from the hospital she has had slurred speech and difficulty finding her words. They also reports that she has not been eating normally. She denies any fevers, chills, nausea, or vomiting. She states that she has had a mild cough. She denies any abdominal pain, or dysuria. There are no aggravating or alleviating factors.  The history is provided by the patient. No language interpreter was used.    Past Medical History  Diagnosis Date  . Abnormal WBC count     low, h/o  . Other and unspecified hyperlipidemia   . Unspecified essential hypertension   . OP (osteoporosis)   . Vitamin D deficiency   . MGUS (monoclonal gammopathy of unknown significance) 03/16/2013  . Back pain   . Breast cancer 1981    mastectomy and lymph node removal  . DM type 2 (diabetes mellitus, type 2)     Pt states she is 'prediabetic'   Past Surgical History  Procedure Laterality Date  . Mastectomy      left  . Breast biopsy      right  . Breast reconstruction    . Skin cancer removal      basal cell skin cancer  . Wisdom tooth extraction    . Tooth extraction     Family History  Problem Relation Age of Onset  . Liver cancer Father   . Diabetes Father    Social History  Substance Use Topics  . Smoking status: Former Smoker -- 1.00 packs/day for 10 years    Types: Cigarettes    Start  date: 05/15/1956    Quit date: 07/05/1966  . Smokeless tobacco: Never Used     Comment: quit 45 years ago  . Alcohol Use: No   OB History    No data available     Review of Systems  Constitutional: Negative for fever and chills.  Respiratory: Negative for shortness of breath.   Cardiovascular: Negative for chest pain.  Gastrointestinal: Negative for nausea, vomiting, diarrhea and constipation.  Genitourinary: Negative for dysuria.  Neurological: Positive for weakness.       Slurred speech  All other systems reviewed and are negative.     Allergies  Review of patient's allergies indicates no known allergies.  Home Medications   Prior to Admission medications   Medication Sig Start Date End Date Taking? Authorizing Provider  aspirin EC 81 MG tablet Take 81 mg by mouth once a week.    Yes Historical Provider, MD  escitalopram (LEXAPRO) 10 MG tablet Take 5 mg by mouth every morning.     Historical Provider, MD  feeding supplement, ENSURE ENLIVE, (ENSURE ENLIVE) LIQD Take 237 mLs by mouth 2 (two) times daily between meals. 03/21/15   Belkys A Regalado, MD  fish oil-omega-3 fatty acids 1000 MG capsule Take 2 g by mouth every morning.     Historical Provider, MD  methocarbamol (ROBAXIN) 500 MG tablet  Take 500 mg by mouth every 8 (eight) hours as needed for muscle spasms.  08/16/14   Historical Provider, MD  Multiple Vitamin (MULTIVITAMIN) capsule Take 1 capsule by mouth every morning.     Historical Provider, MD  propranolol ER (INDERAL LA) 60 MG 24 hr capsule Take 1 capsule (60 mg total) by mouth every morning. 03/22/15   Belkys A Regalado, MD  raloxifene (EVISTA) 60 MG tablet Take 60 mg by mouth every morning.     Historical Provider, MD  rosuvastatin (CRESTOR) 20 MG tablet Take 10 mg by mouth every morning.     Historical Provider, MD  Vitamin D, Ergocalciferol, (DRISDOL) 50000 UNITS CAPS Take 50,000 Units by mouth every Monday.     Historical Provider, MD   BP 125/53 mmHg  Pulse 72   Temp(Src) 99.4 F (37.4 C) (Oral)  Resp 18  SpO2 98% Physical Exam  Constitutional: She is oriented to person, place, and time. She appears well-developed and well-nourished.  HENT:  Head: Normocephalic and atraumatic.  Eyes: Conjunctivae and EOM are normal. Pupils are equal, round, and reactive to light.  Neck: Normal range of motion. Neck supple.  Cardiovascular: Normal rate and regular rhythm.  Exam reveals no gallop and no friction rub.   No murmur heard. Pulmonary/Chest: Effort normal and breath sounds normal. No respiratory distress. She has no wheezes. She has no rales. She exhibits no tenderness.  Abdominal: Soft. Bowel sounds are normal. She exhibits no distension and no mass. There is no tenderness. There is no rebound and no guarding.  Musculoskeletal: Normal range of motion. She exhibits no edema or tenderness.  Neurological: She is alert and oriented to person, place, and time.  CN 3-12 intact, speech is clear, some difficulty with word finding, sensation and strength intact  Skin: Skin is warm and dry.  Psychiatric: She has a normal mood and affect. Her behavior is normal. Judgment and thought content normal.  Nursing note and vitals reviewed.   ED Course  Procedures (including critical care time) Results for orders placed or performed during the hospital encounter of 03/30/15  Ethanol  Result Value Ref Range   Alcohol, Ethyl (B) <5 <5 mg/dL  Protime-INR  Result Value Ref Range   Prothrombin Time 16.7 (H) 11.6 - 15.2 seconds   INR 1.34 0.00 - 1.49  APTT  Result Value Ref Range   aPTT 28 24 - 37 seconds  CBC  Result Value Ref Range   WBC 2.1 (L) 4.0 - 10.5 K/uL   RBC 3.14 (L) 3.87 - 5.11 MIL/uL   Hemoglobin 10.2 (L) 12.0 - 15.0 g/dL   HCT 29.1 (L) 36.0 - 46.0 %   MCV 92.7 78.0 - 100.0 fL   MCH 32.5 26.0 - 34.0 pg   MCHC 35.1 30.0 - 36.0 g/dL   RDW 14.1 11.5 - 15.5 %   Platelets 129 (L) 150 - 400 K/uL  Differential  Result Value Ref Range   Neutrophils  Relative % 77 43 - 77 %   Neutro Abs 1.6 (L) 1.7 - 7.7 K/uL   Lymphocytes Relative 10 (L) 12 - 46 %   Lymphs Abs 0.2 (L) 0.7 - 4.0 K/uL   Monocytes Relative 9 3 - 12 %   Monocytes Absolute 0.2 0.1 - 1.0 K/uL   Eosinophils Relative 3 0 - 5 %   Eosinophils Absolute 0.1 0.0 - 0.7 K/uL   Basophils Relative 1 0 - 1 %   Basophils Absolute 0.0 0.0 - 0.1 K/uL  Comprehensive metabolic panel  Result Value Ref Range   Sodium 129 (L) 135 - 145 mmol/L   Potassium 3.7 3.5 - 5.1 mmol/L   Chloride 95 (L) 101 - 111 mmol/L   CO2 21 (L) 22 - 32 mmol/L   Glucose, Bld 90 65 - 99 mg/dL   BUN 16 6 - 20 mg/dL   Creatinine, Ser 0.93 0.44 - 1.00 mg/dL   Calcium 8.9 8.9 - 10.3 mg/dL   Total Protein 6.5 6.5 - 8.1 g/dL   Albumin 3.1 (L) 3.5 - 5.0 g/dL   AST 31 15 - 41 U/L   ALT 11 (L) 14 - 54 U/L   Alkaline Phosphatase 116 38 - 126 U/L   Total Bilirubin 1.5 (H) 0.3 - 1.2 mg/dL   GFR calc non Af Amer 57 (L) >60 mL/min   GFR calc Af Amer >60 >60 mL/min   Anion gap 13 5 - 15  I-Stat Chem 8, ED  (not at [facility], [facility])  Result Value Ref Range   Sodium 130 (L) 135 - 145 mmol/L   Potassium 3.6 3.5 - 5.1 mmol/L   Chloride 94 (L) 101 - 111 mmol/L   BUN 15 6 - 20 mg/dL   Creatinine, Ser 1.10 (H) 0.44 - 1.00 mg/dL   Glucose, Bld 88 65 - 99 mg/dL   Calcium, Ion 1.13 1.13 - 1.30 mmol/L   TCO2 19 0 - 100 mmol/L   Hemoglobin 10.2 (L) 12.0 - 15.0 g/dL   HCT 30.0 (L) 36.0 - 46.0 %  I-stat troponin, ED (not at [facility], [facility])  Result Value Ref Range   Troponin i, poc 0.02 0.00 - 0.08 ng/mL   Comment 3           Dg Chest 2 View  03/30/2015   CLINICAL DATA:  Cough.  Weakness.  Not eating.  EXAM: CHEST  2 VIEW  COMPARISON:  03/14/2015  FINDINGS: 1.7 mm nodular opacity in the right lung base corresponding to metastatic lesions seen on CT chest 03/15/2015. Mild cardiac enlargement. No pulmonary vascular congestion. No focal consolidation in the lungs. No blunting of costophrenic angles. No pneumothorax. Mediastinal contours  appear intact. Surgical clips in the left axilla. Degenerative changes in the spine. Degenerative changes in the shoulders.  IMPRESSION: Right lower lung nodule corresponding to metastatic lesions seen on previous CT chest. No acute infiltration or consolidation.   Electronically Signed   By: Lucienne Capers M.D.   On: 03/30/2015 21:17   Ct Head Wo Contrast  03/30/2015   CLINICAL DATA:  Patient is weekend not eating. Slurred speech. Recent hospitalizations but last ED visit was discharged home.  EXAM: CT HEAD WITHOUT CONTRAST  TECHNIQUE: Contiguous axial images were obtained from the base of the skull through the vertex without intravenous contrast.  COMPARISON:  03/14/2015  FINDINGS: Diffuse cerebral atrophy. Low-attenuation changes in the deep white matter probably representing central atrophy. Focal old areas of encephalomalacia demonstrated in the cerebellar hemispheres bilaterally, in the frontal lobes bilaterally, and in the left posterior parietal region. These are probably areas of old ischemia. However, mass lesions or acute process not entirely excluded. Consider MRI for further evaluation if clinically indicated. No mass effect or midline shift. No abnormal extra-axial fluid collections. Gray-white matter junctions are distinct. Basal cisterns are not effaced. No evidence of acute intracranial hemorrhage. No ventricular dilatation. Calvarium appears intact. Paranasal sinuses are clear. Mastoid air cells are not opacified.  IMPRESSION: Multifocal low-attenuation areas are probably due to old ischemia abut acute  process or metastatic lesions are not excluded. Consider further evaluation with MRI if clinically indicated. No acute intracranial hemorrhage or mass effect.   Electronically Signed   By: Lucienne Capers M.D.   On: 03/30/2015 22:22   Ct Head Wo Contrast  03/14/2015   CLINICAL DATA:  [place] backwards today.  Loss of consciousness.  EXAM: CT HEAD WITHOUT CONTRAST  TECHNIQUE: Contiguous axial  images were obtained from the base of the skull through the vertex without intravenous contrast.  COMPARISON:  09/12/2010.  FINDINGS: Diffusely enlarged ventricles and subarachnoid spaces. No skull fracture, intracranial hemorrhage or paranasal sinus air-fluid levels.  IMPRESSION: Mild atrophy.  No acute abnormality.   Electronically Signed   By: Claudie Revering M.D.   On: 03/14/2015 19:32   Ct Chest Wo Contrast  03/14/2015   CLINICAL DATA:  Multiple episodes of syncope today. Dizziness when changing from sitting to standing. Right-sided chest pain and shortness of breath. Falls.  EXAM: CT CHEST WITHOUT CONTRAST  TECHNIQUE: Multidetector CT imaging of the chest was performed following the standard protocol without IV contrast.  COMPARISON:  None.  FINDINGS: Normal heart size. Calcification in the Coronary arteries, mitral valve annulus, and aortic valve as well as in the aorta. Small to moderate pericardial effusion. Normal caliber thoracic aorta. Scattered mediastinal lymph nodes are not pathologically enlarged. Esophagus is decompressed.  1.8 cm diameter nodule in the right lower lung. This is indeterminate and primary or metastatic disease needs to be excluded. No other focal lung lesions. No focal consolidation or airspace disease. No pneumothorax. No pleural effusions.  Left breast reconstruction. Prominent adrenal gland nodules bilaterally, right measuring 4.8 x 1.9 cm and left measuring 2.4 x 4.7 cm. Spleen is enlarged but appears homogeneous. Multiple low-attenuation lesions in the liver are likely represent cysts but given the presence of other possible metastatic lesions, further evaluation with contrast-enhanced CT or MRI is suggested. Degenerative changes in the spine. No destructive bone lesions appreciated.  IMPRESSION: 1.8 cm diameter right lower lung nodule and bilateral adrenal gland nodules suspicious for metastatic disease. Multiple low-attenuation lesions in the liver are probably cysts but  further characterization is suggested with either contrast-enhanced CT or MRI. Small to moderate pericardial effusion. Mild splenic enlargement.   Electronically Signed   By: Lucienne Capers M.D.   On: 03/14/2015 22:07   Ct Angio Chest Pe W/cm &/or Wo Cm  03/15/2015   CLINICAL DATA:  History of breast carcinoma with recent syncopal episode and back pain  EXAM: CT ANGIOGRAPHY CHEST WITH CONTRAST  TECHNIQUE: Multidetector CT imaging of the chest was performed using the standard protocol during bolus administration of intravenous contrast. Multiplanar CT image reconstructions and MIPs were obtained to evaluate the vascular anatomy.  CONTRAST:  156m OMNIPAQUE IOHEXOL 350 MG/ML SOLN  COMPARISON:  None.  FINDINGS: The lungs are well aerated bilaterally with small left-sided pleural effusion and left basilar atelectasis. Single calcified granuloma is noted in the left lung. Scattered pulmonary nodules are identified throughout the lungs bilaterally. The largest of these lies in the right lower lobe best seen on image number 66 of series 8 measuring 19 mm in greatest dimension. A 6-7 mm nodule is noted in the left lower lobe best seen on image number 54 of series 8 as well as a tiny 2-3 mm nodule in the left apex best seen on image number 13 of series 8. Given the patient's clinical history these are consistent with metastatic foci.  The thoracic inlet is within normal limits.  The thoracic aorta shows mild calcifications without aneurysmal dilatation or dissection. A pericardial effusion is noted. This measures approximately 2.3 cm in greatest thickness posteriorly. Mild coronary calcifications are noted. The pulmonary artery is well visualized and demonstrates a normal branching pattern. No filling defects to suggest pulmonary emboli are identified. No hilar or mediastinal adenopathy is noted. Some thickening of the midesophagus is noted just above the level of the carina best seen on image number 97 of series 7.   Changes consistent with breast implant are noted on the left. This is consistent with the patient's given clinical history of carcinoma of the breast. Postsurgical changes in the axilla are seen. No axillary adenopathy is identified. The upper abdomen again demonstrates hypodensities within the liver similar to that seen on prior CT. Adrenal lesions are again noted bilaterally.  Review of the MIP images confirms the above findings.  IMPRESSION: Changes consistent with metastatic disease within the adrenal glands bilaterally as well as the lungs bilaterally.  Multiple hypodensities are again seen within liver which may represent metastatic disease. Further evaluation is recommended.  No evidence of pulmonary embolism.  Pericardial effusion.  Thickening of the midesophagus of uncertain significance.   Electronically Signed   By: Inez Catalina M.D.   On: 03/15/2015 10:05   Mr Abdomen W Wo Contrast  03/20/2015   CLINICAL DATA:  Adrenal mass.  Right lower lobe lung lesion.  EXAM: MRI ABDOMEN WITHOUT AND WITH CONTRAST  TECHNIQUE: Multiplanar multisequence MR imaging of the abdomen was performed both before and after the administration of intravenous contrast.  CONTRAST:  19m MULTIHANCE GADOBENATE DIMEGLUMINE 529 MG/ML IV SOLN  COMPARISON:  Abdominal pelvic CT of 03/17/2015. Chest CT of 03/15/2015. History of breast cancer  FINDINGS: Portions of the exam, primarily the postcontrast dynamic series, are motion degraded.  Lower chest: Left larger than right small bilateral pleural effusions. Left breast implant. Small to moderate pericardial effusion. Right lower lobe pulmonary nodule of 1.8 cm on image 5 of series 5. Left base atelectasis.  Hepatobiliary: Multiple T2 hyperintense well-circumscribed liver lesions are consistent with cysts. On post-contrast arterial phase imaging, there are numerous foci of hyper enhancement. Example series 13001. None of these areas are identified on later post-contrast images, and are  favored to represent perfusion anomalies. Normal gallbladder, without biliary ductal dilatation.  Pancreas: Normal, without mass or ductal dilatation.  Spleen: Splenomegaly, greater than 17 cm craniocaudal.  Adrenals/Urinary Tract: Left larger than right adrenal masses. 3.2 x 2.3 cm on the left. 3.1 x 1.4 cm on the right. No signal dropout on out of phase imaging. Central necrosis as evidenced by hypo enhancement.  Bilateral renal cysts. medial interpolar 1.3 cm left renal lesion on image 39 of series 5 is likely hemorrhagic cyst; no post-contrast enhancement. No hydronephrosis.  Stomach/Bowel: Normal stomach and abdominal bowel loops.  Vascular/Lymphatic: Advanced aortic atherosclerosis. Retroperitoneal nodes are prominent but not pathologic by size criteria. Right cardiophrenic angle node is upper normal at 6 mm on image 23 of series 13001.  Other: No ascites.  Musculoskeletal: Convex left lumbar spine curvature.  IMPRESSION: 1. Bilateral adrenal masses with necrosis, most consistent with metastatic disease. 2. Right lung base nodule, likely representing pulmonary metastasis. 3. Bilateral pleural and pericardial effusions. 4. No definite evidence of hepatic metastasis. Multiple hepatic cysts. Arterial foci of hyper enhancement are likely perfusion anomalies. Given the history of breast cancer, recommend attention on follow-up to exclude less likely tiny hypervascular metastasis. 5. Splenomegaly.   Electronically Signed   By:  Abigail Miyamoto M.D.   On: 03/20/2015 08:30   Ct Abdomen Pelvis W Contrast  03/17/2015   CLINICAL DATA:  Pulmonary metastases, adrenal metastases, history of LEFT breast cancer 1981 post mastectomy and lymph node dissection  EXAM: CT ABDOMEN AND PELVIS WITH CONTRAST  TECHNIQUE: Multidetector CT imaging of the abdomen and pelvis was performed using the standard protocol following bolus administration of intravenous contrast. Sagittal and coronal MPR images reconstructed from axial data set.   CONTRAST:  182m OMNIPAQUE IOHEXOL 300 MG/ML SOLN IV. Dilute oral contrast.  COMPARISON:  None; correlation CT chest 03/15/2015  FINDINGS: LEFT breast prosthesis.  Small BILATERAL pleural effusions and bibasilar atelectasis.  Small to moderate pericardial effusion.  RIGHT lower lobe mass 19 x 16 mm image 9.  Multiple low-attenuation foci within liver likely cysts, largest lateral segment LEFT lobe 2.3 x 1.8 cm.  Splenic enlargement, spleen measuring 17.9 x 13.0 x 6.7 cm.  BILATERAL adrenal masses, 4.3 x 2.8 cm LEFT and 4.3 x 2.1 cm RIGHT.  Tiny BILATERAL renal cysts.  Remainder of liver, pancreas, and adrenal glands normal.  Normal appendix.  Questionable rectal wall thickening versus artifact near anus.  Mild wall thickening at gastric cardia, unchanged from CT chest, question gastric neoplasm.  Remainder of stomach, colon and small bowel loops normal.  Unremarkable bladder, ureters, uterus and adnexa.  Free intraperitoneal fluid in pelvis.  Some additional mass, adenopathy, free air or hernia.  Scattered atherosclerotic calcifications.  Normal size retroperitoneal nodes.  Bones demineralized.  Schmorl's node superior endplate T10.  Bones demineralized.  IMPRESSION: RIGHT lower lobe mass 19 x 16 mm question primary versus metastatic focus.  BILATERAL adrenal masses concerning for metastases.  Significant splenic enlargement without focal lesion.  Gastric wall thickening at cardia, tumor not excluded; endoscopic evaluation recommended to exclude gastric neoplasm.  Inferior rectal wall thickening versus artifact, recommend correlation with digital rectal exam and proctoscopy to exclude mass.  Ascites.   Electronically Signed   By: MLavonia DanaM.D.   On: 03/17/2015 11:00   Ct Biopsy  03/19/2015   INDICATION: Pancytopenia. Please perform CT guided bone marrow biopsy for tissue diagnostic purposes  EXAM: CT GUIDED BONE MARROW BIOPSY AND ASPIRATION.  MEDICATIONS: Fentanyl 50 mcg IV; Versed 1 mg IV   ANESTHESIA/SEDATION: Sedation Time  10 minutes  CONTRAST:  None  COMPLICATIONS: None immediate.  PROCEDURE: Informed consent was obtained from the patient following an explanation of the procedure, risks, benefits and alternatives. The patient understands, agrees and consents for the procedure. All questions were addressed. A time out was performed prior to the initiation of the procedure. The patient was positioned prone and non-contrast localization CT was performed of the pelvis to demonstrate the iliac marrow spaces. The operative site was prepped and draped in the usual sterile fashion.  Under sterile conditions and local anesthesia, a 22 gauge spinal needle was utilized for procedural planning. Next, an 11 gauge coaxial bone biopsy needle was advanced into the left iliac marrow space. Needle position was confirmed with CT imaging. Initially, bone marrow aspiration was performed. Next, a bone marrow biopsy was obtained with the 11 gauge outer bone marrow device. Samples were prepared with the cytotechnologist and deemed adequate. The needle was removed intact. Hemostasis was obtained with compression and a dressing was placed. The patient tolerated the procedure well without immediate post procedural complication.  IMPRESSION: Successful CT guided left iliac bone marrow aspiration and core biopsies.   Electronically Signed   By: JEldridge AbrahamsD.  On: 03/19/2015 15:05   Dg Chest Port 1 View  03/14/2015   CLINICAL DATA:  Status post fall.  History of breast cancer.  EXAM: PORTABLE CHEST - 1 VIEW  COMPARISON:  June 23, 2012  FINDINGS: The heart size and mediastinal contours are stable. There is no focal infiltrate, pulmonary edema, or pleural effusion. Left axillary surgical clips are identified unchanged. The visualized skeletal structures are stable.  IMPRESSION: No active cardiopulmonary disease.   Electronically Signed   By: Abelardo Diesel M.D.   On: 03/14/2015 18:45    I have personally reviewed and  evaluated these images and lab results as part of my medical decision-making.   EKG Interpretation   Date/Time:  Saturday March 30 2015 20:40:30 EDT Ventricular Rate:  72 PR Interval:  150 QRS Duration: 75 QT Interval:  448 QTC Calculation: 490 R Axis:   17 Text Interpretation:  Sinus rhythm Low voltage, extremity and precordial  leads Borderline prolonged QT interval Prolonged QT Confirmed by LITTLE  MD, RACHEL ([id number]) on 03/30/2015 9:08:41 PM      MDM   Final diagnoses:  Slurred speech  Weakness    Patient with new slurred speech/word finding problems for the past week or so.  Also complains of generalized fatigue, weakness, and not eating.  Will check labs and CT.  Will reassess.  Patient seen by and discussed with Dr. Rex Kras, who agrees with workup plan.  Currently UA is pending.  Plan for admission if UA is positive.    Will also consult neurology regarding new slurred speech and word finding issues.  Could be mets, (has known chest concerning chest nodule).  Telephone consult with Dr. Doy Mince of neurology, who recommends MRI in the morning.  Plan for admission to medicine at [facility].  Appreciate Dr. Blaine Hamper for admitting the patient.   Montine Circle, PA-C 03/31/15 0013  Sharlett Iles, MD 03/31/15 [phone]

## 2015-03-30 NOTE — ED Notes (Signed)
Pt is from home, caregiver and son at bedside states c/o of pt not eating, and she is weak. Recent hospitalizations but last ed visit was discharged home.

## 2015-03-30 NOTE — Progress Notes (Signed)
Per family pt is leaving and they are going to the ER on [address].

## 2015-03-30 NOTE — H&P (Signed)
[facility] History and Physical  [name] [name] TDD:[id number] DOB: 1935/04/20 DOA: 03/30/2015  Referring physician: ED physician PCP: Jerlyn Ly, MD  Specialists:   Chief Complaint: Difficulty speaking, generalized weakness  HPI: [name] [name] is a 80 y.o. female with PMH of hypertension, hyperlipidemia, diet-controlled diabetes, depression, osteoporosis, MGUS, breast cancer (s/P of mastectomy 1981), pancytopenia, who presents with difficulty speaking and generalized weakness.  Patient was recently hospitalized from 8/25 to 03/21/15 because of syncope. Workup showed that patient has possible metastased disease with lung nodule/adrenal nodule/liver lesions. She was supposed to follow-up with Dr. Marin Olp and had PET scan scheduled on 04/03/15. She reports that after she went home, she has been feeling weak with very poor oral intake. During past week, she has been having difficulty speaking, but no unilateral weakness, vision changes or hearing loss. She has  mild cough with clear mucus production, but no chest pain, fever or chills. She has mild diarrhea for 1 week, no nausea, vomiting, abdominal pain. She has 1-2 bowel movement with loose stool each day. Patient does not have symptoms of  UTI or  rashes.  In ED, patient was found to have pancytopenia with WBC 2.1, hemoglobin 10.2, platelet 129, negative troponin, negative UDS, negative urinalysis, temperature 99.4, electrolytes okay. CT head showed multifocal low-attenuation areas are probably due to old ischemia abut acute process or metastatic lesions, no acute intracranial hemorrhage or mass effect.  Where does patient live?   At home    Can patient participate in ADLs?  none  Review of Systems:   General: no fevers, chills, no changes in body weight, has poor appetite, has fatigue HEENT: no blurry vision, hearing changes or sore throat Pulm: no dyspnea, has coughing, no wheezing CV: no chest pain, palpitations Abd: no nausea, vomiting,  abdominal pain, has diarrhea, no constipation GU: no dysuria, burning on urination, increased urinary frequency, hematuria  Ext: no leg edema Neuro: no unilateral weakness, numbness, or tingling, no vision change or hearing loss. Difficulty speaking. Skin: no rash MSK: No muscle spasm, no deformity, no limitation of range of movement in spin Heme: No easy bruising.  Travel history: No recent long distant travel.  Allergy: No Known Allergies  Past Medical History  Diagnosis Date  . Abnormal WBC count     low, h/o  . Other and unspecified hyperlipidemia   . Unspecified essential hypertension   . OP (osteoporosis)   . Vitamin D deficiency   . MGUS (monoclonal gammopathy of unknown significance) 03/16/2013  . Back pain   . Breast cancer 1981    mastectomy and lymph node removal  . DM type 2 (diabetes mellitus, type 2)     Pt states she is 'prediabetic'    Past Surgical History  Procedure Laterality Date  . Mastectomy      left  . Breast biopsy      right  . Breast reconstruction    . Skin cancer removal      basal cell skin cancer  . Wisdom tooth extraction    . Tooth extraction      Social History:  reports that she quit smoking about 48 years ago. Her smoking use included Cigarettes. She started smoking about 58 years ago. She has a 10 pack-year smoking history. She has never used smokeless tobacco. She reports that she does not drink alcohol or use illicit drugs.  Family History:  Family History  Problem Relation Age of Onset  . Liver cancer Father   . Diabetes Father  Prior to Admission medications   Medication Sig Start Date End Date Taking? Authorizing Provider  aspirin EC 81 MG tablet Take 81 mg by mouth once a week.     Historical Provider, MD  escitalopram (LEXAPRO) 10 MG tablet Take 5 mg by mouth every morning.     Historical Provider, MD  feeding supplement, ENSURE ENLIVE, (ENSURE ENLIVE) LIQD Take 237 mLs by mouth 2 (two) times daily between meals.  03/21/15   Belkys A Regalado, MD  fish oil-omega-3 fatty acids 1000 MG capsule Take 2 g by mouth every morning.     Historical Provider, MD  methocarbamol (ROBAXIN) 500 MG tablet Take 500 mg by mouth every 8 (eight) hours as needed for muscle spasms.  08/16/14   Historical Provider, MD  Multiple Vitamin (MULTIVITAMIN) capsule Take 1 capsule by mouth every morning.     Historical Provider, MD  propranolol ER (INDERAL LA) 60 MG 24 hr capsule Take 1 capsule (60 mg total) by mouth every morning. 03/22/15   Belkys A Regalado, MD  raloxifene (EVISTA) 60 MG tablet Take 60 mg by mouth every morning.     Historical Provider, MD  rosuvastatin (CRESTOR) 20 MG tablet Take 10 mg by mouth every morning.     Historical Provider, MD  Vitamin D, Ergocalciferol, (DRISDOL) 50000 UNITS CAPS Take 50,000 Units by mouth every Monday.     Historical Provider, MD    Physical Exam: Filed Vitals:   03/30/15 2239 03/31/15 0026 03/31/15 0117 03/31/15 0528  BP: 125/40 114/57 102/33 106/41  Pulse: 73 77 73 68  Temp: 99.3 F (37.4 C)  98.2 F (36.8 C) 98.1 F (36.7 C)  TempSrc: Oral  Oral Oral  Resp: 20 18 18 18   Height:   5\' 8"  (1.727 m)   Weight:   60.918 kg (134 lb 4.8 oz)   SpO2: 93% 98% 98% 96%   General: Not in acute distress. Dry mucous and membrane. Cachectic HEENT:       Eyes: PERRL, EOMI, no scleral icterus.       ENT: No discharge from the ears and nose, no pharynx injection, no tonsillar enlargement.        Neck: No JVD, no bruit, no mass felt. Heme: No neck lymph node enlargement. Cardiac: S1/S2, RRR, No murmurs, No gallops or rubs. Pulm:  No rales, wheezing, rhonchi or rubs. Abd: Soft, nondistended, nontender, no rebound pain, no organomegaly, BS present. Ext: No pitting leg edema bilaterally. 2+DP/PT pulse bilaterally. Musculoskeletal: No joint deformities, No joint redness or warmth, no limitation of ROM in spin. Skin: No rashes.  Neuro: Alert, oriented X3, cranial nerves II-XII grossly intact,  muscle strength symmetric 4/5 in all extremities, sensation to light touch intact. Brachial reflex 1+ bilaterally. Knee reflex 1+ bilaterally. Negative Babinski's sign. Normal finger to nose test. Psych: Patient is not psychotic, no suicidal or hemocidal ideation.  Labs on Admission:  Basic Metabolic Panel:  Recent Labs Lab 03/30/15 2025 03/30/15 2035 03/31/15 0346  NA 129* 130* 132*  K 3.7 3.6 3.6  CL 95* 94* 99*  CO2 21*  --  21*  GLUCOSE 90 88 83  BUN 16 15 15   CREATININE 0.93 1.10* 1.07*  CALCIUM 8.9  --  8.5*   Liver Function Tests:  Recent Labs Lab 03/30/15 2025 03/31/15 0346  AST 31 26  ALT 11* 9*  ALKPHOS 116 99  BILITOT 1.5* 1.1  PROT 6.5 5.6*  ALBUMIN 3.1* 2.7*   No results for input(s): LIPASE, AMYLASE in  the last 168 hours. No results for input(s): AMMONIA in the last 168 hours. CBC:  Recent Labs Lab 03/30/15 2025 03/30/15 2035 03/31/15 0346  WBC 2.1*  --  2.0*  NEUTROABS 1.6*  --   --   HGB 10.2* 10.2* 9.7*  HCT 29.1* 30.0* 29.1*  MCV 92.7  --  90.7  PLT 129*  --  124*   Cardiac Enzymes: No results for input(s): CKTOTAL, CKMB, CKMBINDEX, TROPONINI in the last 168 hours.  BNP (last 3 results)  Recent Labs  03/14/15 1905 03/31/15 0346  BNP 23.4 53.3    ProBNP (last 3 results) No results for input(s): PROBNP in the last 8760 hours.  CBG: No results for input(s): GLUCAP in the last 168 hours.  Radiological Exams on Admission: Dg Chest 2 View  03/30/2015   CLINICAL DATA:  Cough.  Weakness.  Not eating.  EXAM: CHEST  2 VIEW  COMPARISON:  03/14/2015  FINDINGS: 1.7 mm nodular opacity in the right lung base corresponding to metastatic lesions seen on CT chest 03/15/2015. Mild cardiac enlargement. No pulmonary vascular congestion. No focal consolidation in the lungs. No blunting of costophrenic angles. No pneumothorax. Mediastinal contours appear intact. Surgical clips in the left axilla. Degenerative changes in the spine. Degenerative changes  in the shoulders.  IMPRESSION: Right lower lung nodule corresponding to metastatic lesions seen on previous CT chest. No acute infiltration or consolidation.   Electronically Signed   By: Lucienne Capers M.D.   On: 03/30/2015 21:17   Ct Head Wo Contrast  03/30/2015   CLINICAL DATA:  Patient is weekend not eating. Slurred speech. Recent hospitalizations but last ED visit was discharged home.  EXAM: CT HEAD WITHOUT CONTRAST  TECHNIQUE: Contiguous axial images were obtained from the base of the skull through the vertex without intravenous contrast.  COMPARISON:  03/14/2015  FINDINGS: Diffuse cerebral atrophy. Low-attenuation changes in the deep white matter probably representing central atrophy. Focal old areas of encephalomalacia demonstrated in the cerebellar hemispheres bilaterally, in the frontal lobes bilaterally, and in the left posterior parietal region. These are probably areas of old ischemia. However, mass lesions or acute process not entirely excluded. Consider MRI for further evaluation if clinically indicated. No mass effect or midline shift. No abnormal extra-axial fluid collections. Gray-white matter junctions are distinct. Basal cisterns are not effaced. No evidence of acute intracranial hemorrhage. No ventricular dilatation. Calvarium appears intact. Paranasal sinuses are clear. Mastoid air cells are not opacified.  IMPRESSION: Multifocal low-attenuation areas are probably due to old ischemia abut acute process or metastatic lesions are not excluded. Consider further evaluation with MRI if clinically indicated. No acute intracranial hemorrhage or mass effect.   Electronically Signed   By: Lucienne Capers M.D.   On: 03/30/2015 22:22    EKG: Independently reviewed.  Abnormal findings:  QTC 490, diffuse low voltage   Assessment/Plan Principal Problem:   Difficulty speaking Active Problems:   MGUS (monoclonal gammopathy of unknown significance)   Adrenal nodule   Chronic diastolic heart  failure   Pulmonary nodules/lesions, multiple   DM type 2 (diabetes mellitus, type 2)   Pancytopenia   Generalized weakness   HLD (hyperlipidemia)   Depression   Slurred speech   Protein calorie malnutrition   Diarrhea   Difficulty speaking: ED physician discussed CT-head findings with neurology, Dr. Doy Mince, who thinks this is likely due to metastasized disease. Recommended MRI to follow-up. In previous admission, patient was found to have possible metastasizing disease, with lung nodule/adrenal nodule/liver lesions. PET  scan was ordered, but not done yet.  -will admit to tele bed -MRI-brain w/ and w/o CM -NPO and SLP -IVF: 1L NS and then 75 cc/h. -will order PET scan -PT/OT  MGUS (monoclonal gammopathy of unknown significance): now has pancytopenia -Follow-up with the oncology  Chronic diastolic heart failure: 2-D echo on 03/15/15 showed EF 65-70% with grade 1 diastolic dysfunction. Patient is not on diuretics. CHF is compensated. -Continue aspirin and propranolol -Check BMP  DM type 2 (diabetes mellitus, type 2): No A1c on recorder. Patient is not taking medications at home. -CBG every morning -A1c  HLD: Last LDL was not on record. -Continue home medications: Lipitor -Check FLP  Depression: Stable, no suicidal or homicidal ideations. -Continue home medications: Lexapro  Protein calorie malnutrition -Ensure  Diarrhea: mild. Unclear etiology. -check c diff pcr -IVF as above  Generalized weakness: Likely due to conditioning due to multiple morbidities. -PT/OT   DVT ppx: SQ Heparin    Code Status: Full code Family Communication:   Yes, patient's daughter and son at bed side Disposition Plan: Admit to inpatient   Date of Service 03/31/2015    Ivor Costa [facility] Pager [phone]  If 7PM-7AM, please contact night-coverage [email or web address] Password [facility] 03/31/2015, 7:24 AM

## 2015-03-30 NOTE — ED Notes (Signed)
I have given pt 240 mL of water for PO challenge, she has drank it all without noticeable difficulty, denies nausea.

## 2015-03-31 ENCOUNTER — Inpatient Hospital Stay (HOSPITAL_COMMUNITY): Payer: Medicare Other

## 2015-03-31 DIAGNOSIS — E46 Unspecified protein-calorie malnutrition: Secondary | ICD-10-CM | POA: Diagnosis present

## 2015-03-31 DIAGNOSIS — R197 Diarrhea, unspecified: Secondary | ICD-10-CM | POA: Diagnosis present

## 2015-03-31 DIAGNOSIS — E871 Hypo-osmolality and hyponatremia: Secondary | ICD-10-CM

## 2015-03-31 DIAGNOSIS — R479 Unspecified speech disturbances: Secondary | ICD-10-CM | POA: Diagnosis present

## 2015-03-31 LAB — COMPREHENSIVE METABOLIC PANEL
ALBUMIN: 2.7 g/dL — AB (ref 3.5–5.0)
ALT: 9 U/L — ABNORMAL LOW (ref 14–54)
ANION GAP: 12 (ref 5–15)
AST: 26 U/L (ref 15–41)
Alkaline Phosphatase: 99 U/L (ref 38–126)
BUN: 15 mg/dL (ref 6–20)
CO2: 21 mmol/L — AB (ref 22–32)
Calcium: 8.5 mg/dL — ABNORMAL LOW (ref 8.9–10.3)
Chloride: 99 mmol/L — ABNORMAL LOW (ref 101–111)
Creatinine, Ser: 1.07 mg/dL — ABNORMAL HIGH (ref 0.44–1.00)
GFR calc Af Amer: 56 mL/min — ABNORMAL LOW (ref >60)
GFR calc non Af Amer: 48 mL/min — ABNORMAL LOW (ref >60)
GLUCOSE: 83 mg/dL (ref 65–99)
POTASSIUM: 3.6 mmol/L (ref 3.5–5.1)
SODIUM: 132 mmol/L — AB (ref 135–145)
Total Bilirubin: 1.1 mg/dL (ref 0.3–1.2)
Total Protein: 5.6 g/dL — ABNORMAL LOW (ref 6.5–8.1)

## 2015-03-31 LAB — LIPID PANEL
CHOL/HDL RATIO: 11 ratio
CHOLESTEROL: 110 mg/dL (ref 0–200)
HDL: 10 mg/dL — AB (ref >40)
LDL CALC: 67 mg/dL (ref 0–99)
TRIGLYCERIDES: 163 mg/dL — AB (ref <150)
VLDL: 33 mg/dL (ref 0–40)

## 2015-03-31 LAB — LACTIC ACID, PLASMA
Lactic Acid, Venous: 0.9 mmol/L (ref 0.5–2.0)
Lactic Acid, Venous: 0.8 mmol/L (ref 0.5–2.0)

## 2015-03-31 LAB — CBC
HEMATOCRIT: 29.1 % — AB (ref 36.0–46.0)
HEMOGLOBIN: 9.7 g/dL — AB (ref 12.0–15.0)
MCH: 30.2 pg (ref 26.0–34.0)
MCHC: 33.3 g/dL (ref 30.0–36.0)
MCV: 90.7 fL (ref 78.0–100.0)
Platelets: 124 10*3/uL — ABNORMAL LOW (ref 150–400)
RBC: 3.21 MIL/uL — ABNORMAL LOW (ref 3.87–5.11)
RDW: 14.1 % (ref 11.5–15.5)
WBC: 2 10*3/uL — ABNORMAL LOW (ref 4.0–10.5)

## 2015-03-31 LAB — BRAIN NATRIURETIC PEPTIDE: B Natriuretic Peptide: 53.3 pg/mL (ref 0.0–100.0)

## 2015-03-31 LAB — PROCALCITONIN: Procalcitonin: 0.12 ng/mL

## 2015-03-31 LAB — GLUCOSE, CAPILLARY: Glucose-Capillary: 81 mg/dL (ref 65–99)

## 2015-03-31 MED ORDER — GADOBENATE DIMEGLUMINE 529 MG/ML IV SOLN
15.0000 mL | Freq: Once | INTRAVENOUS | Status: AC | PRN
Start: 2015-03-31 — End: 2015-03-31
  Administered 2015-03-31: 12 mL via INTRAVENOUS

## 2015-03-31 MED ORDER — SODIUM CHLORIDE 0.9 % IJ SOLN
3.0000 mL | Freq: Two times a day (BID) | INTRAMUSCULAR | Status: DC
  Administered 2015-04-02 – 2015-04-04 (×5): 3 mL via INTRAVENOUS

## 2015-03-31 MED ORDER — OMEGA-3-ACID ETHYL ESTERS 1 G PO CAPS
2.0000 g | ORAL_CAPSULE | Freq: Every day | ORAL | Status: DC
Start: 2015-03-31 — End: 2015-04-05
  Administered 2015-04-01 – 2015-04-02 (×2): 2 g via ORAL
  Filled 2015-03-31 (×5): qty 2

## 2015-03-31 MED ORDER — PROPRANOLOL HCL ER 60 MG PO CP24
60.0000 mg | ORAL_CAPSULE | Freq: Every morning | ORAL | Status: DC
  Administered 2015-03-31 – 2015-04-05 (×6): 60 mg via ORAL
  Filled 2015-03-31 (×7): qty 1

## 2015-03-31 MED ORDER — ROSUVASTATIN CALCIUM 10 MG PO TABS
10.0000 mg | ORAL_TABLET | Freq: Every morning | ORAL | Status: DC
  Administered 2015-03-31 – 2015-04-05 (×6): 10 mg via ORAL
  Filled 2015-03-31 (×7): qty 1

## 2015-03-31 MED ORDER — OMEGA-3 FATTY ACIDS 1000 MG PO CAPS: 2.0000 g | ORAL_CAPSULE | Freq: Every morning | ORAL | Status: DC

## 2015-03-31 MED ORDER — ACETAMINOPHEN 325 MG PO TABS
650.0000 mg | ORAL_TABLET | Freq: Four times a day (QID) | ORAL | Status: DC | PRN
  Administered 2015-04-04 – 2015-04-10 (×2): 650 mg via ORAL
  Filled 2015-03-31 (×3): qty 2

## 2015-03-31 MED ORDER — CHLORHEXIDINE GLUCONATE 0.12 % MT SOLN
15.0000 mL | Freq: Two times a day (BID) | OROMUCOSAL | Status: DC
  Administered 2015-03-31 – 2015-04-04 (×10): 15 mL via OROMUCOSAL
  Filled 2015-03-31 (×10): qty 15

## 2015-03-31 MED ORDER — SODIUM CHLORIDE 0.9 % IV SOLN
INTRAVENOUS | Status: DC
  Administered 2015-03-31: 02:00:00 via INTRAVENOUS

## 2015-03-31 MED ORDER — MULTIVITAMINS PO CAPS: 1.0000 | ORAL_CAPSULE | Freq: Every morning | ORAL | Status: DC

## 2015-03-31 MED ORDER — RALOXIFENE HCL 60 MG PO TABS
60.0000 mg | ORAL_TABLET | Freq: Every morning | ORAL | Status: DC
  Administered 2015-03-31 – 2015-04-05 (×6): 60 mg via ORAL
  Filled 2015-03-31 (×7): qty 1

## 2015-03-31 MED ORDER — ESCITALOPRAM OXALATE 5 MG PO TABS
5.0000 mg | ORAL_TABLET | Freq: Every morning | ORAL | Status: DC
  Administered 2015-03-31 – 2015-04-05 (×6): 5 mg via ORAL
  Filled 2015-03-31 (×6): qty 1

## 2015-03-31 MED ORDER — HEPARIN SODIUM (PORCINE) 5000 UNIT/ML IJ SOLN
5000.0000 [IU] | Freq: Three times a day (TID) | INTRAMUSCULAR | Status: DC
  Administered 2015-03-31 – 2015-04-04 (×10): 5000 [IU] via SUBCUTANEOUS
  Filled 2015-03-31 (×8): qty 1

## 2015-03-31 MED ORDER — ENSURE ENLIVE PO LIQD
237.0000 mL | Freq: Two times a day (BID) | ORAL | Status: DC
  Administered 2015-03-31 – 2015-04-04 (×8): 237 mL via ORAL

## 2015-03-31 MED ORDER — ADULT MULTIVITAMIN W/MINERALS CH
1.0000 | ORAL_TABLET | Freq: Every day | ORAL | Status: DC
  Administered 2015-04-01 – 2015-04-03 (×3): 1 via ORAL
  Filled 2015-03-31 (×4): qty 1

## 2015-03-31 MED ORDER — ACETAMINOPHEN 650 MG RE SUPP: 650.0000 mg | Freq: Four times a day (QID) | RECTAL | Status: DC | PRN

## 2015-03-31 MED ORDER — HYDROXYZINE HCL 50 MG/ML IM SOLN
25.0000 mg | Freq: Four times a day (QID) | INTRAMUSCULAR | Status: DC | PRN
  Filled 2015-03-31: qty 0.5

## 2015-03-31 MED ORDER — CETYLPYRIDINIUM CHLORIDE 0.05 % MT LIQD
7.0000 mL | Freq: Two times a day (BID) | OROMUCOSAL | Status: DC
  Administered 2015-03-31 – 2015-04-04 (×8): 7 mL via OROMUCOSAL

## 2015-03-31 MED ORDER — ASPIRIN EC 81 MG PO TBEC
81.0000 mg | DELAYED_RELEASE_TABLET | ORAL | Status: DC
  Administered 2015-03-31: 81 mg via ORAL
  Filled 2015-03-31 (×3): qty 1

## 2015-03-31 MED ORDER — POTASSIUM CHLORIDE IN NACL 40-0.9 MEQ/L-% IV SOLN
INTRAVENOUS | Status: DC
  Administered 2015-03-31 – 2015-04-01 (×2): 60 mL/h via INTRAVENOUS
  Filled 2015-03-31 (×2): qty 1000

## 2015-03-31 NOTE — Progress Notes (Addendum)
PROGRESS NOTE    [name] [name] CVE:[id number] DOB: [birth year] DOA: 03/30/2015 PCP: Jerlyn Ly, MD  Oncologist: Dr. Marin Olp  HPI/Brief narrative 80 year old female with history of HTN, HLD, diet-controlled DM 2, depression, MGUS, breast cancer status post mastectomy 1981, pancytopenia, hospitalized 8/25-03/21/15 for syncope at which time workup showed possible metastatic disease with lung nodules/adrenal nodules/liver lesions, admitted again to [facility] on 03/30/15 with complaints of weakness, poor oral intake, several days difficulty speaking but no unilateral weakness or visual abnormalities. Mild diarrhea. In the ED, pancytopenic and CT head shows multiple low-attenuation areas probably due to old ischemia but acute process or metastatic lesions not excluded. Case was discussed with neuro hospitalist who recommended MRI brain to rule out metastatic disease.   Assessment/Plan:  Speech difficulty/concern for metastatic disease - CT head findings as below. - MRI brain negative for metastatic disease or acute findings. PET scan can be performed as outpatient. - No obvious dysarthria or facial asymmetry this morning. Passed bedside swallow screen. - We will alert oncologist of patient's admission. - Unclear reason as to patient's presentation. Urine microscopy and chest x-ray without infectious process. Less likely to be TIA given duration of symptoms.? Related to dehydration. Monitor.  Pancytopenia - Unclear etiology. Stable. Follow CBC and as per oncology  Dehydration with hyponatremia - Continue to poor oral intake. Brief IV normal saline hydration  MGUS - Per oncology follow-up as outpatient  Chronic diastolic CHF - Not on diuretics at home. Clinically dry.  Diet-controlled DM 2  Hyperlipidemia - Continue statins  Diarrhea - If no further diarrhea, DC contact isolation and plans for C. difficile workup  Essential hypertension - Controlled with  propranolol  Recent hospitalization for syncope, orthostatic hypotension, acute renal failure and abnormal troponins    DVT prophylaxis: Subcutaneous heparin Code Status: Full Family Communication: None at bedside Disposition Plan: DC home when medically stable   Consultants:  None  Procedures:  None  Antibiotics:  None   Subjective: Denies complaints this morning. Denies slurred speech or difficulty swallowing. As per nursing, past bedside RN swallow screen.  Objective: Filed Vitals:   03/30/15 2239 03/31/15 0026 03/31/15 0117 03/31/15 0528  BP: 125/40 114/57 102/33 106/41  Pulse: 73 77 73 68  Temp: 99.3 F (37.4 C)  98.2 F (36.8 C) 98.1 F (36.7 C)  TempSrc: Oral  Oral Oral  Resp: 20 18 18 18   Height:   5\' 8"  (1.727 m)   Weight:   60.918 kg (134 lb 4.8 oz)   SpO2: 93% 98% 98% 96%   No intake or output data in the 24 hours ending 03/31/15 1135 Filed Weights   03/31/15 0117  Weight: 60.918 kg (134 lb 4.8 oz)     Exam:  General exam: Pleasant elderly female lying comfortably supine in bed. Slightly dry oral mucosa. Respiratory system: Clear. No increased work of breathing. Cardiovascular system: S1 & S2 heard, RRR. No JVD, murmurs, gallops, clicks or pedal edema. Telemetry: Sinus rhythm Gastrointestinal system: Abdomen is nondistended, soft and nontender. Normal bowel sounds heard. Central nervous system: Alert and oriented. No focal neurological deficits. Extremities: Symmetric 5 x 5 power.   Data Reviewed: Basic Metabolic Panel:  Recent Labs Lab 03/30/15 2025 03/30/15 2035 03/31/15 0346  NA 129* 130* 132*  K 3.7 3.6 3.6  CL 95* 94* 99*  CO2 21*  --  21*  GLUCOSE 90 88 83  BUN 16 15 15   CREATININE 0.93 1.10* 1.07*  CALCIUM 8.9  --  8.5*  Liver Function Tests:  Recent Labs Lab 03/30/15 2025 03/31/15 0346  AST 31 26  ALT 11* 9*  ALKPHOS 116 99  BILITOT 1.5* 1.1  PROT 6.5 5.6*  ALBUMIN 3.1* 2.7*   No results for input(s): LIPASE,  AMYLASE in the last 168 hours. No results for input(s): AMMONIA in the last 168 hours. CBC:  Recent Labs Lab 03/30/15 2025 03/30/15 2035 03/31/15 0346  WBC 2.1*  --  2.0*  NEUTROABS 1.6*  --   --   HGB 10.2* 10.2* 9.7*  HCT 29.1* 30.0* 29.1*  MCV 92.7  --  90.7  PLT 129*  --  124*   Cardiac Enzymes: No results for input(s): CKTOTAL, CKMB, CKMBINDEX, TROPONINI in the last 168 hours. BNP (last 3 results) No results for input(s): PROBNP in the last 8760 hours. CBG:  Recent Labs Lab 03/31/15 0720  GLUCAP 81    No results found for this or any previous visit (from the past 240 hour(s)).       Studies: Dg Chest 2 View  03/30/2015   CLINICAL DATA:  Cough.  Weakness.  Not eating.  EXAM: CHEST  2 VIEW  COMPARISON:  03/14/2015  FINDINGS: 1.7 mm nodular opacity in the right lung base corresponding to metastatic lesions seen on CT chest 03/15/2015. Mild cardiac enlargement. No pulmonary vascular congestion. No focal consolidation in the lungs. No blunting of costophrenic angles. No pneumothorax. Mediastinal contours appear intact. Surgical clips in the left axilla. Degenerative changes in the spine. Degenerative changes in the shoulders.  IMPRESSION: Right lower lung nodule corresponding to metastatic lesions seen on previous CT chest. No acute infiltration or consolidation.   Electronically Signed   By: Lucienne Capers M.D.   On: 03/30/2015 21:17   Ct Head Wo Contrast  03/30/2015   CLINICAL DATA:  Patient is weekend not eating. Slurred speech. Recent hospitalizations but last ED visit was discharged home.  EXAM: CT HEAD WITHOUT CONTRAST  TECHNIQUE: Contiguous axial images were obtained from the base of the skull through the vertex without intravenous contrast.  COMPARISON:  03/14/2015  FINDINGS: Diffuse cerebral atrophy. Low-attenuation changes in the deep white matter probably representing central atrophy. Focal old areas of encephalomalacia demonstrated in the cerebellar hemispheres  bilaterally, in the frontal lobes bilaterally, and in the left posterior parietal region. These are probably areas of old ischemia. However, mass lesions or acute process not entirely excluded. Consider MRI for further evaluation if clinically indicated. No mass effect or midline shift. No abnormal extra-axial fluid collections. Gray-white matter junctions are distinct. Basal cisterns are not effaced. No evidence of acute intracranial hemorrhage. No ventricular dilatation. Calvarium appears intact. Paranasal sinuses are clear. Mastoid air cells are not opacified.  IMPRESSION: Multifocal low-attenuation areas are probably due to old ischemia abut acute process or metastatic lesions are not excluded. Consider further evaluation with MRI if clinically indicated. No acute intracranial hemorrhage or mass effect.   Electronically Signed   By: Lucienne Capers M.D.   On: 03/30/2015 22:22        Scheduled Meds: . antiseptic oral rinse  7 mL Mouth Rinse q12n4p  . aspirin EC  81 mg Oral Weekly  . chlorhexidine  15 mL Mouth Rinse BID  . escitalopram  5 mg Oral q morning - 10a  . feeding supplement (ENSURE ENLIVE)  237 mL Oral BID BM  . heparin  5,000 Units Subcutaneous 3 times per day  . multivitamin with minerals  1 tablet Oral Daily  . omega-3 acid ethyl  esters  2 g Oral Daily  . propranolol ER  60 mg Oral q morning - 10a  . raloxifene  60 mg Oral q morning - 10a  . rosuvastatin  10 mg Oral q morning - 10a  . sodium chloride  3 mL Intravenous Q12H   Continuous Infusions: . sodium chloride 75 mL/hr at 03/31/15 0141    Principal Problem:   Difficulty speaking Active Problems:   MGUS (monoclonal gammopathy of unknown significance)   Adrenal nodule   Chronic diastolic heart failure   Pulmonary nodules/lesions, multiple   DM type 2 (diabetes mellitus, type 2)   Pancytopenia   Generalized weakness   HLD (hyperlipidemia)   Depression   Slurred speech   Protein calorie malnutrition    Diarrhea    Time spent: 30 minutes.    Vernell Leep, MD, FACP, FHM. [facility] Pager [phone]  If 7PM-7AM, please contact night-coverage [email or web address] Password [email or web address] 03/31/2015, 11:35 AM    LOS: 1 day

## 2015-03-31 NOTE — Progress Notes (Signed)
Received orders for swallow evaluation however based on chart review, patient has passed the RN stroke swallow screen. Based on protocol, would defer evaluation. Discussed with RN. MD, please clarify if SLP swallow evaluation is needed.  [place], [place] [phone]

## 2015-04-01 DIAGNOSIS — R4182 Altered mental status, unspecified: Secondary | ICD-10-CM

## 2015-04-01 LAB — BASIC METABOLIC PANEL
ANION GAP: 15 (ref 5–15)
ANION GAP: 11 (ref 5–15)
BUN: 11 mg/dL (ref 6–20)
BUN: 10 mg/dL (ref 6–20)
CALCIUM: 9 mg/dL (ref 8.9–10.3)
CHLORIDE: 104 mmol/L (ref 101–111)
CHLORIDE: 105 mmol/L (ref 101–111)
CO2: 16 mmol/L — ABNORMAL LOW (ref 22–32)
CO2: 18 mmol/L — AB (ref 22–32)
Calcium: 8.7 mg/dL — ABNORMAL LOW (ref 8.9–10.3)
Creatinine, Ser: 0.84 mg/dL (ref 0.44–1.00)
Creatinine, Ser: 0.92 mg/dL (ref 0.44–1.00)
GFR calc Af Amer: >60 mL/min (ref >60)
GFR calc non Af Amer: 58 mL/min — ABNORMAL LOW (ref >60)
GLUCOSE: 77 mg/dL (ref 65–99)
GLUCOSE: 78 mg/dL (ref 65–99)
POTASSIUM: 4.4 mmol/L (ref 3.5–5.1)
Potassium: 4.3 mmol/L (ref 3.5–5.1)
Sodium: 135 mmol/L (ref 135–145)
Sodium: 134 mmol/L — ABNORMAL LOW (ref 135–145)

## 2015-04-01 LAB — HEMOGLOBIN A1C
HEMOGLOBIN A1C: 5.7 % — AB (ref 4.8–5.6)
MEAN PLASMA GLUCOSE: 117 mg/dL

## 2015-04-01 LAB — CBC
HEMATOCRIT: 30.9 % — AB (ref 36.0–46.0)
HEMOGLOBIN: 10.9 g/dL — AB (ref 12.0–15.0)
MCH: 34 pg (ref 26.0–34.0)
MCHC: 35.3 g/dL (ref 30.0–36.0)
MCV: 96.3 fL (ref 78.0–100.0)
Platelets: 104 10*3/uL — ABNORMAL LOW (ref 150–400)
RBC: 3.21 MIL/uL — ABNORMAL LOW (ref 3.87–5.11)
RDW: 14.6 % (ref 11.5–15.5)
WBC: 1.8 10*3/uL — AB (ref 4.0–10.5)

## 2015-04-01 LAB — GLUCOSE, CAPILLARY: Glucose-Capillary: 74 mg/dL (ref 65–99)

## 2015-04-01 LAB — VITAMIN B12: VITAMIN B 12: 1453 pg/mL — AB (ref 180–914)

## 2015-04-01 LAB — TSH: TSH: 4.409 u[IU]/mL (ref 0.350–4.500)

## 2015-04-01 MED ORDER — SODIUM BICARBONATE 650 MG PO TABS
650.0000 mg | ORAL_TABLET | Freq: Two times a day (BID) | ORAL | Status: DC
  Administered 2015-04-01: 650 mg via ORAL
  Filled 2015-04-01: qty 1

## 2015-04-01 MED ORDER — PROMETHAZINE HCL 25 MG PO TABS
12.5000 mg | ORAL_TABLET | Freq: Three times a day (TID) | ORAL | Status: DC | PRN
  Administered 2015-04-01: 12.5 mg via ORAL
  Filled 2015-04-01: qty 1

## 2015-04-01 MED ORDER — SODIUM BICARBONATE 8.4 % IV SOLN
INTRAVENOUS | Status: AC
  Administered 2015-04-01 – 2015-04-02 (×2): via INTRAVENOUS
  Filled 2015-04-01 (×3): qty 100

## 2015-04-01 NOTE — Progress Notes (Signed)
Patient passed RNSSS and based on doc flow sheets is afebrile with 75% intake.  SLP will cancel evaluation, please reorder if indicated.  Thanks. Luanna Salk, [place] [facility] SLP [phone]

## 2015-04-01 NOTE — Care Management Important Message (Signed)
Important Message  Patient Details IM Letter given to Cookie/ Case Manager to present to patient Name: [name] [name] MRN: [id number] Date of Birth: [birth year]   Medicare Important Message Given:  Yes-second notification given    Camillo Flaming 04/01/2015, 12:27 [place] Message  Patient Details  Name: [name] [name] MRN: [id number] Date of Birth: 1934/09/25   Medicare Important Message Given:  Yes-second notification given    Camillo Flaming 04/01/2015, 12:27 PM

## 2015-04-01 NOTE — Progress Notes (Signed)
OT Cancellation Note  Patient Details Name: [name] [name] MRN: [id number] DOB: 04-Aug-1934   Cancelled Treatment:    Reason Eval/Treat Not Completed: Fatigue/lethargy limiting ability to participate Will recheck as schedule allows.  [name] [place], [place] 04/01/2015, 12:08 PM

## 2015-04-01 NOTE — Progress Notes (Addendum)
PT Cancellation Note  Patient Details Name: [name] [name] MRN: [id number] DOB: 1934-10-26   Cancelled Treatment:    Reason Eval/Treat Not Completed: Fatigue/lethargy limiting ability to participate  Pt sleeping upon arriving at room.  Pt easily awakened however fell back asleep while talking.  Will check back as schedule permits.  Checked back on pt this afternoon and still falls asleep easily and declined mobility today.  Ludwig Tugwell,KATHrine E 04/01/2015, 10:28 AM Carmelia Bake, PT, DPT 04/01/2015 Pager: [phone]

## 2015-04-01 NOTE — Progress Notes (Signed)
PROGRESS NOTE    [name] [name] TTS:[id number] DOB: [birth year] DOA: 03/30/2015 PCP: Jerlyn Ly, MD  Oncologist: Dr. Marin Olp  HPI/Brief narrative 80 year old female with history of HTN, HLD, diet-controlled DM 2, depression, MGUS, breast cancer status post mastectomy 1981, pancytopenia, hospitalized 8/25-03/21/15 for syncope at which time workup showed possible metastatic disease with lung nodules/adrenal nodules/liver lesions, admitted again to [facility] on 03/30/15 with complaints of weakness, poor oral intake, several days difficulty speaking but no unilateral weakness or visual abnormalities. Mild diarrhea. In the ED, pancytopenic and CT head shows multiple low-attenuation areas probably due to old ischemia but acute process or metastatic lesions not excluded. Case was discussed with neuro hospitalist who recommended MRI brain to rule out metastatic disease.   Assessment/Plan:  Altered MS - CT head findings as below. - MRI brain negative for metastatic disease or acute findings. PET scan can be performed as outpatient. - No obvious dysarthria or facial asymmetry. Passed bedside swallow screen. - Unclear reason as to patient's presentation. Urine microscopy and chest x-ray without infectious process. Less likely to be TIA given duration of symptoms.? Related to dehydration. Monitor. - Discussed extensively with daughter [name] on 9/12: did OK for 3 days post recent DC and since then rapid decline with poor PO intake, generalized weakness, inability to walk and confusion. She doesn't think mom has made any improvement since admission. No h/o slurred speech or asymmetric weakness at any time - Check HIV, RPR, TSH and B 12 - also could be due to paraneoplastic etiology  Pancytopenia - Unclear etiology.  - Consulted Dr. Marin Olp, Oncology for further input  Dehydration with hyponatremia - Continue to poor oral intake. Brief IV normal saline hydration  MGUS - Per oncology  follow-up as outpatient  Chronic diastolic CHF - Not on diuretics at home. Compensated.  Diet-controlled DM 2  Hyperlipidemia - Continue statins  Diarrhea - If no further diarrhea reported, DC contact isolation and plans for C. difficile workup  Essential hypertension - Controlled with propranolol  Recent hospitalization for syncope, orthostatic hypotension, acute renal failure and abnormal troponins  NAG - bicarbonate    DVT prophylaxis: Subcutaneous heparin Code Status: Full Family Communication: Discussed extensively with daughter [name] on 04/01/2015. Disposition Plan: DC home when medically stable   Consultants:  Oncology  Procedures:  None  Antibiotics:  None   Subjective: Confused. Denies complaints.  Objective: Filed Vitals:   03/31/15 1451 03/31/15 2136 04/01/15 0346 04/01/15 0505  BP: 101/47 119/44  123/45  Pulse: 68 69  71  Temp: 98.2 F (36.8 C) 100.2 F (37.9 C) 99.2 F (37.3 C) 98.9 F (37.2 C)  TempSrc: Oral Oral Oral Oral  Resp: 18 18  18   Height:      Weight:      SpO2: 98% 94%  100%    Intake/Output Summary (Last 24 hours) at 04/01/15 1333 Last data filed at 04/01/15 1120  Gross per 24 hour  Intake   1039 ml  Output      0 ml  Net   1039 ml   Filed Weights   03/31/15 0117  Weight: 60.918 kg (134 lb 4.8 oz)     Exam:  General exam: Pleasant elderly female lying comfortably supine in bed. Chronically ill looking Respiratory system: Clear. No increased work of breathing. Cardiovascular system: S1 & S2 heard, RRR. No JVD, murmurs, gallops, clicks or pedal edema.  Gastrointestinal system: Abdomen is nondistended, soft and nontender. Normal bowel sounds heard. Central nervous system:  Alert and oriented only to self. No focal neurological deficits. Extremities: Symmetric 5 x 5 power.   Data Reviewed: Basic Metabolic Panel:  Recent Labs Lab 03/30/15 2025 03/30/15 2035 03/31/15 0346 04/01/15 0429 04/01/15 0914    NA 129* 130* 132* 135 134*  K 3.7 3.6 3.6 4.4 4.3  CL 95* 94* 99* 104 105  CO2 21*  --  21* 16* 18*  GLUCOSE 90 88 83 77 78  BUN 16 15 15 11 10   CREATININE 0.93 1.10* 1.07* 0.84 0.92  CALCIUM 8.9  --  8.5* 8.7* 9.0   Liver Function Tests:  Recent Labs Lab 03/30/15 2025 03/31/15 0346  AST 31 26  ALT 11* 9*  ALKPHOS 116 99  BILITOT 1.5* 1.1  PROT 6.5 5.6*  ALBUMIN 3.1* 2.7*   No results for input(s): LIPASE, AMYLASE in the last 168 hours. No results for input(s): AMMONIA in the last 168 hours. CBC:  Recent Labs Lab 03/30/15 2025 03/30/15 2035 03/31/15 0346 04/01/15 0914  WBC 2.1*  --  2.0* 1.8*  NEUTROABS 1.6*  --   --   --   HGB 10.2* 10.2* 9.7* 10.9*  HCT 29.1* 30.0* 29.1* 30.9*  MCV 92.7  --  90.7 96.3  PLT 129*  --  124* 104*   Cardiac Enzymes: No results for input(s): CKTOTAL, CKMB, CKMBINDEX, TROPONINI in the last 168 hours. BNP (last 3 results) No results for input(s): PROBNP in the last 8760 hours. CBG:  Recent Labs Lab 03/31/15 0720 04/01/15 0741  GLUCAP 81 74    Recent Results (from the past 240 hour(s))  Culture, blood (x 2)     Status: None (Preliminary result)   Collection Time: 03/31/15  1:30 AM  Result Value Ref Range Status   Specimen Description BLOOD RIGHT ARM  Final   Special Requests BOTTLES DRAWN AEROBIC ONLY 5CC  Final   Culture   Final    NO GROWTH 1 DAY Performed at [facility]    Report Status PENDING  Incomplete  Culture, blood (x 2)     Status: None (Preliminary result)   Collection Time: 03/31/15  1:50 AM  Result Value Ref Range Status   Specimen Description BLOOD RIGHT HAND  Final   Special Requests BOTTLES DRAWN AEROBIC ONLY 5CC  Final   Culture   Final    NO GROWTH 1 DAY Performed at [facility]    Report Status PENDING  Incomplete         Studies: Dg Chest 2 View  03/30/2015   CLINICAL DATA:  Cough.  Weakness.  Not eating.  EXAM: CHEST  2 VIEW  COMPARISON:  03/14/2015  FINDINGS: 1.7 mm  nodular opacity in the right lung base corresponding to metastatic lesions seen on CT chest 03/15/2015. Mild cardiac enlargement. No pulmonary vascular congestion. No focal consolidation in the lungs. No blunting of costophrenic angles. No pneumothorax. Mediastinal contours appear intact. Surgical clips in the left axilla. Degenerative changes in the spine. Degenerative changes in the shoulders.  IMPRESSION: Right lower lung nodule corresponding to metastatic lesions seen on previous CT chest. No acute infiltration or consolidation.   Electronically Signed   By: Lucienne Capers M.D.   On: 03/30/2015 21:17   Ct Head Wo Contrast  03/30/2015   CLINICAL DATA:  Patient is weekend not eating. Slurred speech. Recent hospitalizations but last ED visit was discharged home.  EXAM: CT HEAD WITHOUT CONTRAST  TECHNIQUE: Contiguous axial images were obtained from the base of the  skull through the vertex without intravenous contrast.  COMPARISON:  03/14/2015  FINDINGS: Diffuse cerebral atrophy. Low-attenuation changes in the deep white matter probably representing central atrophy. Focal old areas of encephalomalacia demonstrated in the cerebellar hemispheres bilaterally, in the frontal lobes bilaterally, and in the left posterior parietal region. These are probably areas of old ischemia. However, mass lesions or acute process not entirely excluded. Consider MRI for further evaluation if clinically indicated. No mass effect or midline shift. No abnormal extra-axial fluid collections. Gray-white matter junctions are distinct. Basal cisterns are not effaced. No evidence of acute intracranial hemorrhage. No ventricular dilatation. Calvarium appears intact. Paranasal sinuses are clear. Mastoid air cells are not opacified.  IMPRESSION: Multifocal low-attenuation areas are probably due to old ischemia abut acute process or metastatic lesions are not excluded. Consider further evaluation with MRI if clinically indicated. No acute  intracranial hemorrhage or mass effect.   Electronically Signed   By: Lucienne Capers M.D.   On: 03/30/2015 22:22   Mr Jeri Cos WC Contrast  03/31/2015   CLINICAL DATA:  New onset of generalized weakness and difficulty speaking. Personal history breast cancer.  EXAM: MRI HEAD WITHOUT AND WITH CONTRAST  TECHNIQUE: Multiplanar, multiecho pulse sequences of the brain and surrounding structures were obtained without and with intravenous contrast.  CONTRAST:  33mL MULTIHANCE GADOBENATE DIMEGLUMINE 529 MG/ML IV SOLN  COMPARISON:  CT of the head 03/30/2015 and 09/12/2010.  FINDINGS: The diffusion-weighted images demonstrate no evidence for acute or subacute infarction. No acute hemorrhage or mass lesion is present. The ventricles are of normal size. No significant extra-axial fluid collection is present.  The basal ganglia and brainstem are within normal limits. Flow is present in the major intracranial arteries. The globes and orbits are intact. The paranasal sinuses and mastoid air cells are clear the skullbase is within normal limits. Midline structures are unremarkable.  The postcontrast images demonstrate no pathologic enhancement.  IMPRESSION: 1. No acute or focal lesion to explain the patient's acute symptoms. 2. Mild atrophy is within normal limits for age. There is no significant white matter disease.   Electronically Signed   By: San Morelle M.D.   On: 03/31/2015 11:35        Scheduled Meds: . antiseptic oral rinse  7 mL Mouth Rinse q12n4p  . aspirin EC  81 mg Oral Weekly  . chlorhexidine  15 mL Mouth Rinse BID  . escitalopram  5 mg Oral q morning - 10a  . feeding supplement (ENSURE ENLIVE)  237 mL Oral BID BM  . heparin  5,000 Units Subcutaneous 3 times per day  . multivitamin with minerals  1 tablet Oral Daily  . omega-3 acid ethyl esters  2 g Oral Daily  . propranolol ER  60 mg Oral q morning - 10a  . raloxifene  60 mg Oral q morning - 10a  . rosuvastatin  10 mg Oral q morning -  10a  . sodium bicarbonate  650 mg Oral BID  . sodium chloride  3 mL Intravenous Q12H   Continuous Infusions:    Principal Problem:   Difficulty speaking Active Problems:   MGUS (monoclonal gammopathy of unknown significance)   Adrenal nodule   Chronic diastolic heart failure   Pulmonary nodules/lesions, multiple   DM type 2 (diabetes mellitus, type 2)   Pancytopenia   Generalized weakness   HLD (hyperlipidemia)   Depression   Slurred speech   Protein calorie malnutrition   Diarrhea    Time spent: 30 minutes.  Vernell Leep, MD, FACP, FHM. [facility] Pager [phone]  If 7PM-7AM, please contact night-coverage [email or web address] Password [email or web address] 04/01/2015, 1:33 PM    LOS: 2 days

## 2015-04-01 NOTE — Progress Notes (Signed)
[facility]  Patient Status: Active (receiving services up to time of hospitalization)  [facility] is providing the following services: RN and PT  If patient discharges after hours, please call [phone].   [name] [name] 04/01/2015, 1:35 PM

## 2015-04-02 ENCOUNTER — Other Ambulatory Visit (HOSPITAL_COMMUNITY): Payer: Medicare Other

## 2015-04-02 ENCOUNTER — Inpatient Hospital Stay (HOSPITAL_COMMUNITY): Payer: Medicare Other

## 2015-04-02 DIAGNOSIS — D472 Monoclonal gammopathy: Secondary | ICD-10-CM

## 2015-04-02 DIAGNOSIS — I10 Essential (primary) hypertension: Secondary | ICD-10-CM

## 2015-04-02 DIAGNOSIS — I313 Pericardial effusion (noninflammatory): Secondary | ICD-10-CM

## 2015-04-02 DIAGNOSIS — E860 Dehydration: Secondary | ICD-10-CM

## 2015-04-02 DIAGNOSIS — D61818 Other pancytopenia: Principal | ICD-10-CM

## 2015-04-02 DIAGNOSIS — R531 Weakness: Secondary | ICD-10-CM

## 2015-04-02 DIAGNOSIS — E46 Unspecified protein-calorie malnutrition: Secondary | ICD-10-CM

## 2015-04-02 LAB — BASIC METABOLIC PANEL
ANION GAP: 7 (ref 5–15)
BUN: 7 mg/dL (ref 6–20)
CALCIUM: 8.5 mg/dL — AB (ref 8.9–10.3)
CO2: 25 mmol/L (ref 22–32)
Chloride: 100 mmol/L — ABNORMAL LOW (ref 101–111)
Creatinine, Ser: 0.86 mg/dL (ref 0.44–1.00)
GFR calc Af Amer: >60 mL/min (ref >60)
GLUCOSE: 118 mg/dL — AB (ref 65–99)
Potassium: 3.7 mmol/L (ref 3.5–5.1)
SODIUM: 132 mmol/L — AB (ref 135–145)

## 2015-04-02 LAB — CBC WITH DIFFERENTIAL/PLATELET
Basophils Absolute: 0 10*3/uL (ref 0.0–0.1)
Basophils Relative: 1 % (ref 0–1)
EOS ABS: 0.1 10*3/uL (ref 0.0–0.7)
EOS PCT: 4 % (ref 0–5)
HCT: 28.3 % — ABNORMAL LOW (ref 36.0–46.0)
Hemoglobin: 9.5 g/dL — ABNORMAL LOW (ref 12.0–15.0)
LYMPHS ABS: 0.1 10*3/uL — AB (ref 0.7–4.0)
Lymphocytes Relative: 10 % — ABNORMAL LOW (ref 12–46)
MCH: 30.8 pg (ref 26.0–34.0)
MCHC: 33.6 g/dL (ref 30.0–36.0)
MCV: 91.9 fL (ref 78.0–100.0)
MONO ABS: 0.2 10*3/uL (ref 0.1–1.0)
Monocytes Relative: 19 % — ABNORMAL HIGH (ref 3–12)
NEUTROS PCT: 66 % (ref 43–77)
Neutro Abs: 0.9 10*3/uL — ABNORMAL LOW (ref 1.7–7.7)
PLATELETS: 101 10*3/uL — AB (ref 150–400)
RBC: 3.08 MIL/uL — AB (ref 3.87–5.11)
RDW: 14.4 % (ref 11.5–15.5)
WBC: 1.3 10*3/uL — AB (ref 4.0–10.5)

## 2015-04-02 LAB — HIV ANTIBODY (ROUTINE TESTING W REFLEX): HIV Screen 4th Generation wRfx: NONREACTIVE

## 2015-04-02 LAB — GLUCOSE, CAPILLARY: Glucose-Capillary: 106 mg/dL — ABNORMAL HIGH (ref 65–99)

## 2015-04-02 LAB — LACTATE DEHYDROGENASE: LDH: 101 U/L (ref 98–192)

## 2015-04-02 LAB — RPR: RPR: NONREACTIVE

## 2015-04-02 MED ORDER — HYDROCORTISONE NA SUCCINATE PF 100 MG IJ SOLR
100.0000 mg | Freq: Two times a day (BID) | INTRAMUSCULAR | Status: DC
  Administered 2015-04-02 – 2015-04-05 (×7): 100 mg via INTRAVENOUS
  Filled 2015-04-02 (×7): qty 2

## 2015-04-02 NOTE — Consult Note (Signed)
[facility] Consult Note  [name] [name] [birth year]  [id number].    Requesting MD: Dr. Burney Gauze Chief Complaint/Reason for Consult: adrenal nodules CONSIDER Location/radiation/quality/duration/severity/prior symptoms/associated symptoms  HPI: [name] [name] is a pleasant 81 y/o female who was admitted on 03/30/15 for mental status changes, anorexia, and weakness. She has a PMH significant for MGUS, lung/adrenal nodules (diagnosed at her last visit 8/15-03/21/15), pancytopenia, diastolic CHF, DM2, HLD, and depression. She is currently in and out of sleep and oriented to person, but not place or time. She is able to answer simple yes/no questions about PMH and SHx but getting a detailed history is difficult. The patient speaks with slurred speech and has difficulty forming words/sentences.The patient was unable to tell me what brought her to the hospital. The patient denies HA, changes in vision/hearing, SOB, palpitations, CP, abdominal pain, N/V/D, constipation, hematemesis, hematochezia, dysuria, hematuria, and muscle weakness. She reports a non-productive cough associated with mild chest discomfort, but expresses no other complaints at this time. The patient is unable to determine any precipitating or alleviating factors. The patient denies pain, pain radiation, and is unable to rate her pain on the pain scale.  Past surgical history:  S/P left mastectomy with lymph node removal in 1981 Linear, oblique scar in LLQ of unknown origin Left lower leg scar from excisional biopsy for basal cell carcinoma  We were consulted regarding possible adrenal gland biopsy for tissue diagnosis by Dr. Marin Olp.   ROS: All systems reviewed and otherwise negative except for as above  Family History  Problem Relation Age of Onset  . Liver cancer Father   . Diabetes Father     Past Medical History  Diagnosis Date  . Abnormal WBC count     low, h/o  . Other and unspecified hyperlipidemia   .  Unspecified essential hypertension   . OP (osteoporosis)   . Vitamin D deficiency   . MGUS (monoclonal gammopathy of unknown significance) 03/16/2013  . Back pain   . Breast cancer 1981    mastectomy and lymph node removal  . DM type 2 (diabetes mellitus, type 2)     Pt states she is 'prediabetic'    Past Surgical History  Procedure Laterality Date  . Mastectomy      left  . Breast biopsy      right  . Breast reconstruction    . Skin cancer removal      basal cell skin cancer  . Wisdom tooth extraction    . Tooth extraction      Social History:  reports that she quit smoking about 48 years ago. Her smoking use included Cigarettes. She started smoking about 58 years ago. She has a 10 pack-year smoking history. She has never used smokeless tobacco. She reports that she does not drink alcohol or use illicit drugs.  Allergies: No Known Allergies  Medications Prior to Admission  Medication Sig Dispense Refill  . escitalopram (LEXAPRO) 10 MG tablet Take 5 mg by mouth every morning.     . feeding supplement, ENSURE ENLIVE, (ENSURE ENLIVE) LIQD Take 237 mLs by mouth 2 (two) times daily between meals. 237 mL 12  . fish oil-omega-3 fatty acids 1000 MG capsule Take 2 g by mouth every morning.     . Multiple Vitamin (MULTIVITAMIN) capsule Take 1 capsule by mouth every morning.     . propranolol ER (INDERAL LA) 60 MG 24 hr capsule Take 1 capsule (60 mg total) by mouth every morning. 30 capsule 0  .  raloxifene (EVISTA) 60 MG tablet Take 60 mg by mouth every morning.     . rosuvastatin (CRESTOR) 20 MG tablet Take 10 mg by mouth every morning.     . Vitamin D, Ergocalciferol, (DRISDOL) 50000 UNITS CAPS Take 50,000 Units by mouth every Monday.     [name] aspirin EC 81 MG tablet Take 81 mg by mouth once a week.       Blood pressure 124/60, pulse 73, temperature 99 F (37.2 C), temperature source Oral, resp. rate 17, height 5' 8"  (1.727 m), weight 63.7 kg (140 lb 6.9 oz), SpO2 100 %.  Physical  Exam: General: pleasant, WD/WN white female who is laying in bed in NAD.  HEENT: head is normocephalic, atraumatic.  Sclera are noninjected. Mouth is pink and moist Neck: trachea is midline, no thyroid passes,  Lymph: palpable anterior cervical lymphadenopathy BL, no posterior cervical, axillary, or groin lymphadenopathy appreciated. Heart: regular, rate, and rhythm.  No obvious murmurs, gallops, or rubs noted.  Palpable pedal pulses bilaterally Lungs:  Respiratory effort non-labored. BL rhonchi. No wheezes or rhales. Abd: soft, NT/ND, +BS, no masses, hernias, or organomegaly. Scar, approximately 8-12cm in LLQ.  MS: all 4 extremities are symmetrical with no cyanosis, clubbing, or edema. Skin: warm and dry with no masses, lesions, or rashes. Mastectomy scare over left breast. Visible scarring on left lower leg from skin cancer removal. Psych: alert to person, not place or time. with an appropriate affect. Neuro: CM 2-12 intact, extremity CSM intact bilaterally, abnormal speech  Results for orders placed or performed during the hospital encounter of 03/30/15 (from the past 48 hour(s))  Basic metabolic panel     Status: Abnormal   Collection Time: 04/01/15  4:29 AM  Result Value Ref Range   Sodium 135 135 - 145 mmol/L   Potassium 4.4 3.5 - 5.1 mmol/L    Comment: DELTA CHECK NOTED REPEATED TO VERIFY SLIGHT HEMOLYSIS    Chloride 104 101 - 111 mmol/L   CO2 16 (L) 22 - 32 mmol/L   Glucose, Bld 77 65 - 99 mg/dL   BUN 11 6 - 20 mg/dL   Creatinine, Ser 0.84 0.44 - 1.00 mg/dL   Calcium 8.7 (L) 8.9 - 10.3 mg/dL   GFR calc non Af Amer >60 >60 mL/min   GFR calc Af Amer >60 >60 mL/min    Comment: (NOTE) The eGFR has been calculated using the CKD EPI equation. This calculation has not been validated in all clinical situations. eGFR's persistently <60 mL/min signify possible Chronic Kidney Disease.    Anion gap 15 5 - 15  Glucose, capillary     Status: None   Collection Time: 04/01/15  7:41 AM   Result Value Ref Range   Glucose-Capillary 74 65 - 99 mg/dL  CBC     Status: Abnormal   Collection Time: 04/01/15  9:14 AM  Result Value Ref Range   WBC 1.8 (L) 4.0 - 10.5 K/uL   RBC 3.21 (L) 3.87 - 5.11 MIL/uL   Hemoglobin 10.9 (L) 12.0 - 15.0 g/dL   HCT 30.9 (L) 36.0 - 46.0 %   MCV 96.3 78.0 - 100.0 fL   MCH 34.0 26.0 - 34.0 pg   MCHC 35.3 30.0 - 36.0 g/dL   RDW 14.6 11.5 - 15.5 %   Platelets 104 (L) 150 - 400 K/uL    Comment: CONSISTENT WITH PREVIOUS RESULT  Basic metabolic panel     Status: Abnormal   Collection Time: 04/01/15  9:14 AM  Result  Value Ref Range   Sodium 134 (L) 135 - 145 mmol/L   Potassium 4.3 3.5 - 5.1 mmol/L   Chloride 105 101 - 111 mmol/L   CO2 18 (L) 22 - 32 mmol/L   Glucose, Bld 78 65 - 99 mg/dL   BUN 10 6 - 20 mg/dL   Creatinine, Ser 0.92 0.44 - 1.00 mg/dL   Calcium 9.0 8.9 - 10.3 mg/dL   GFR calc non Af Amer 58 (L) >60 mL/min   GFR calc Af Amer >60 >60 mL/min    Comment: (NOTE) The eGFR has been calculated using the CKD EPI equation. This calculation has not been validated in all clinical situations. eGFR's persistently <60 mL/min signify possible Chronic Kidney Disease.    Anion gap 11 5 - 15  TSH     Status: None   Collection Time: 04/01/15 11:51 AM  Result Value Ref Range   TSH 4.409 0.350 - 4.500 uIU/mL  Vitamin B12     Status: Abnormal   Collection Time: 04/01/15 11:51 AM  Result Value Ref Range   Vitamin B-12 1453 (H) 180 - 914 pg/mL    Comment: (NOTE) This assay is not validated for testing neonatal or myeloproliferative syndrome specimens for Vitamin B12 levels. Performed at [facility]   RPR     Status: None   Collection Time: 04/01/15 11:51 AM  Result Value Ref Range   RPR Ser Ql Non Reactive Non Reactive    Comment: (NOTE) Performed At: [facility] [place], [place] [id number] Lindon Romp MD GY:[id number]   HIV antibody     Status: None   Collection Time: 04/01/15 11:51 AM  Result  Value Ref Range   HIV Screen 4th Generation wRfx Non Reactive Non Reactive    Comment: (NOTE) Performed At: [facility] [place], [place] [id number] Lindon Romp MD OY:[id number]   CBC with Differential/Platelet     Status: Abnormal   Collection Time: 04/02/15  4:58 AM  Result Value Ref Range   WBC 1.3 (LL) 4.0 - 10.5 K/uL    Comment: RESULT REPEATED AND VERIFIED CRITICAL RESULT CALLED TO, READ BACK BY AND VERIFIED WITH: D BROWN RN @ 0645 ON 04/02/15 BY C DAVIS    RBC 3.08 (L) 3.87 - 5.11 MIL/uL   Hemoglobin 9.5 (L) 12.0 - 15.0 g/dL   HCT 28.3 (L) 36.0 - 46.0 %   MCV 91.9 78.0 - 100.0 fL   MCH 30.8 26.0 - 34.0 pg   MCHC 33.6 30.0 - 36.0 g/dL   RDW 14.4 11.5 - 15.5 %   Platelets 101 (L) 150 - 400 K/uL    Comment: RESULT REPEATED AND VERIFIED CONSISTENT WITH PREVIOUS RESULT PLATELET COUNT CONFIRMED BY SMEAR    Neutrophils Relative % 66 43 - 77 %   Lymphocytes Relative 10 (L) 12 - 46 %   Monocytes Relative 19 (H) 3 - 12 %   Eosinophils Relative 4 0 - 5 %   Basophils Relative 1 0 - 1 %   Neutro Abs 0.9 (L) 1.7 - 7.7 K/uL   Lymphs Abs 0.1 (L) 0.7 - 4.0 K/uL   Monocytes Absolute 0.2 0.1 - 1.0 K/uL   Eosinophils Absolute 0.1 0.0 - 0.7 K/uL   Basophils Absolute 0.0 0.0 - 0.1 K/uL   WBC Morphology WHITE COUNT CONFIRMED ON SMEAR   Basic metabolic panel     Status: Abnormal   Collection Time: 04/02/15  4:58 AM  Result Value Ref  Range   Sodium 132 (L) 135 - 145 mmol/L   Potassium 3.7 3.5 - 5.1 mmol/L   Chloride 100 (L) 101 - 111 mmol/L   CO2 25 22 - 32 mmol/L   Glucose, Bld 118 (H) 65 - 99 mg/dL   BUN 7 6 - 20 mg/dL   Creatinine, Ser 0.86 0.44 - 1.00 mg/dL   Calcium 8.5 (L) 8.9 - 10.3 mg/dL   GFR calc non Af Amer >60 >60 mL/min   GFR calc Af Amer >60 >60 mL/min    Comment: (NOTE) The eGFR has been calculated using the CKD EPI equation. This calculation has not been validated in all clinical situations. eGFR's persistently <60 mL/min signify  possible Chronic Kidney Disease.    Anion gap 7 5 - 15  Lactate dehydrogenase     Status: None   Collection Time: 04/02/15  4:58 AM  Result Value Ref Range   LDH 101 98 - 192 U/L  Glucose, capillary     Status: Abnormal   Collection Time: 04/02/15  7:34 AM  Result Value Ref Range   Glucose-Capillary 106 (H) 65 - 99 mg/dL   Mr Jeri Cos Wo Contrast  03/31/2015   CLINICAL DATA:  New onset of generalized weakness and difficulty speaking. Personal history breast cancer.  EXAM: MRI HEAD WITHOUT AND WITH CONTRAST  TECHNIQUE: Multiplanar, multiecho pulse sequences of the brain and surrounding structures were obtained without and with intravenous contrast.  CONTRAST:  35m MULTIHANCE GADOBENATE DIMEGLUMINE 529 MG/ML IV SOLN  COMPARISON:  CT of the head 03/30/2015 and 09/12/2010.  FINDINGS: The diffusion-weighted images demonstrate no evidence for acute or subacute infarction. No acute hemorrhage or mass lesion is present. The ventricles are of normal size. No significant extra-axial fluid collection is present.  The basal ganglia and brainstem are within normal limits. Flow is present in the major intracranial arteries. The globes and orbits are intact. The paranasal sinuses and mastoid air cells are clear the skullbase is within normal limits. Midline structures are unremarkable.  The postcontrast images demonstrate no pathologic enhancement.  IMPRESSION: 1. No acute or focal lesion to explain the patient's acute symptoms. 2. Mild atrophy is within normal limits for age. There is no significant white matter disease.   Electronically Signed   By: CSan MorelleM.D.   On: 03/31/2015 11:35    Assessment/Plan Adrenal nodules  1.  Originally planning to do a laparoscopic adrenal nodule biopsy, but after further chart review, due to pts severely enlarged spleen and pancytopenia with WBC 1.3, recommend IR biopsy of a single adrenal nodule. Talked with Dr. MLaurence Ferrarion 9/13 at 2:52pm who thinks they can  get a bx of the left adrenal. Consult for CT-guided biopsy placed. Discussed with Dr. HAlgis Liming 2.  NPO after midnight, IVF 3.  SCD's and heparin for DVT proph - watch CBC due to pancytopenia. D/c all blood thinning medications after midnight.  4.  Ambulate and IS 5. Consult cardiology for clearance for surgery   Per medicine:  MGUS Pulmonary nodules/lesions Chronic diastolic heart failure Pancytopenia Altered mental status Essential hypertension Hyperlipidemia Diet-controlled DM2   SMila Palmer PWalling[facility] LLCSurgery 04/02/2015, 11:13 AM Pager: Physician Assistant Student, [employer]

## 2015-04-02 NOTE — Progress Notes (Signed)
Echocardiogram 2D Echocardiogram has been performed.  [name] [name] 04/02/2015, 4:22 PM

## 2015-04-02 NOTE — Progress Notes (Signed)
Initial Nutrition Assessment  DOCUMENTATION CODES:   Non-severe (moderate) malnutrition in context of acute illness/injury  INTERVENTION:  - Continue Ensure Enlive BID, each supplement provides 350 kcal and 20 grams of protein - Will order Magic cup once/day this supplement provides 290 kcal and 9 grams of protein - RD will continue to monitor for needs  NUTRITION DIAGNOSIS:   Inadequate oral intake related to lethargy/confusion, nausea as evidenced by meal completion < 25%.  GOAL:   Patient will meet greater than or equal to 90% of their needs  MONITOR:   PO intake, Supplement acceptance, Weight trends, Labs, I & O's  REASON FOR ASSESSMENT:   Low Braden  ASSESSMENT:   80 y.o. female with PMH of hypertension, hyperlipidemia, diet-controlled diabetes, depression, osteoporosis, MGUS, breast cancer (s/P of mastectomy 1981), pancytopenia, who presents with difficulty speaking and generalized weakness.  Pt seen for Braden. BMI indicates normal weight status. Pt minimally communicative during interaction. Per chart review, she ate 0% of all meals yesterday with notes indicating she was nauseated during breakfast and was unable to be aroused for dinner.   She states that she ate "a little bit" of breakfast this AM and recalls having eggs on her tray but unable to give further detail. RN in the room reports that pt had eggs and grits and ate "maybe a bite." RN does state that pt has been consuming liquids and drank part of an Ensure this AM. Pt has been having coughing instances but RN is unsure if this is related to intakes as pt coughs even without intakes.  Pt unable to give details about intakes or weight trends PTA. Per H&P stating information from pt's son, pt had a poor appetite PTA and was not eating well; unable to determine time frame for this as no family in the room at time of RD visit.  Per weight hx review, pt has gained 6 lbs in the past 2 days; mild edema to BLE noted but  question accuracy of weight recordings in short time frame. Per review, pt has lost 7 lbs (5% body weight) in the past 14 days which is significant for time frame.  Mild muscle wasting noted to clavicle and shoulder area.  No meeting needs. Will order Magic Cup to trial. Medications reviewed. Labs reviewed; Na: 132 mmol/L, Cl: 100 mmol/L, Ca: 8.5 mg/dL.   Diet Order:  Diet Heart Room service appropriate?: Yes; Fluid consistency:: Thin  Skin:  Wound (see comment) (L flank wound)  Last BM:  9/12  Height:   Ht Readings from Last 1 Encounters:  03/31/15 5\' 8"  (1.727 m)    Weight:   Wt Readings from Last 1 Encounters:  04/02/15 140 lb 6.9 oz (63.7 kg)    Ideal Body Weight:  63.64 kg (kg)  BMI:  Body mass index is 21.36 kg/(m^2).  Estimated Nutritional Needs:   Kcal:  1300-1500  Protein:  60-70 grams  Fluid:  1.8-2 L/day  EDUCATION NEEDS:   No education needs identified at this time     Jarome Matin, RD, LDN Inpatient Clinical Dietitian Pager # [phone] After hours/weekend pager # [phone]

## 2015-04-02 NOTE — Evaluation (Signed)
Physical Therapy Evaluation Patient Details Name: [name] [name] MRN: [id number] DOB: 09/27/1934 Today's Date: 04/02/2015   History of Present Illness  80 y/o female who was admitted on 03/30/15 for mental status changes, weakness, difficulty speaking. She has a PMH significant for MGUS, hospitalized 8/25-03/21/15 for syncope at which time workup showed possible metastatic disease with lung nodules/adrenal nodules/liver lesions, pancytopenia, diastolic CHF, DM2, HLD, and depression  Clinical Impression  Pt admitted with above diagnosis. Pt currently with functional limitations due to the deficits listed below (see PT Problem List).  Pt will benefit from skilled PT to increase their independence and safety with mobility to allow discharge to the venue listed below.  Pt with recent admission and presents with functional decline today when compared to previous admission notes.  Pt also presents with cognitive deficits and requiring multimodal cues and increased time to process/initiate/complete tasks at this time.  Recommend SNF upon d/c.     Follow Up Recommendations SNF;Supervision/Assistance - 24 hour    Equipment Recommendations  Rolling walker with 5" wheels    Recommendations for Other Services       Precautions / Restrictions Precautions Precautions: Fall      Mobility  Bed Mobility Overal bed mobility: Needs Assistance Bed Mobility: Supine to Sit;Sit to Supine     Supine to sit: Mod assist;HOB elevated Sit to supine: Mod assist   General bed mobility comments: multimodal cues and increased time required, assist for LEs to EOB and trunk upright, assist for LEs onto bed  Transfers Overall transfer level: Needs assistance Equipment used: Rolling walker (2 wheeled) Transfers: Sit to/from Stand Sit to Stand: Mod assist         General transfer comment: verbal cues for safe technique, assist to rise and steady  Ambulation/Gait Ambulation/Gait assistance: Min assist Ambulation  Distance (Feet): 40 Feet Assistive device: Rolling walker (2 wheeled) Gait Pattern/deviations: Step-through pattern;Decreased stride length;Narrow base of support Gait velocity: decreased   General Gait Details: verbal cues for RW positioning and posture, assist to steady and manuever RW required  Stairs            Wheelchair Mobility    Modified Rankin (Stroke Patients Only)       Balance Overall balance assessment: Needs assistance         Standing balance support: Bilateral upper extremity supported Standing balance-Leahy Scale: Poor Standing balance comment: requires UE assist                             Pertinent Vitals/Pain Pain Assessment: No/denies pain    Home Living Family/patient expects to be discharged to:: Private residence Living Arrangements: Alone Available Help at Discharge: Family;Friend(s);Available PRN/intermittently Type of Home: House Home Access: Stairs to enter Entrance Stairs-Rails: None Entrance Stairs-Number of Steps: 2-3 Home Layout: One level Home Equipment: None Additional Comments: per previous admission    Prior Function Level of Independence: Independent         Comments: pt reports recently weaker and needing more assist     Hand Dominance   Dominant Hand: Right    Extremity/Trunk Assessment   Upper Extremity Assessment: Generalized weakness           Lower Extremity Assessment: Generalized weakness         Communication   Communication: No difficulties  Cognition Arousal/Alertness: Lethargic Behavior During Therapy: WFL for tasks assessed/performed Overall Cognitive Status: No family/caregiver present to determine baseline cognitive functioning Area of Impairment: Orientation;Following  commands Orientation Level: Disoriented to;Time (date: "9 8 38")     Following Commands: Follows one step commands inconsistently;Follows one step commands with increased time       General Comments:  initially very lethargic however improved alertness upon sitting EOB, difficulty processing and initiating cues, required repeated cues    General Comments      Exercises        Assessment/Plan    PT Assessment Patient needs continued PT services  PT Diagnosis Difficulty walking;Generalized weakness   PT Problem List Decreased strength;Decreased balance;Decreased mobility;Decreased knowledge of use of DME;Decreased safety awareness;Decreased cognition;Decreased activity tolerance  PT Treatment Interventions Gait training;Functional mobility training;Therapeutic activities;Therapeutic exercise;DME instruction;Balance training;Patient/family education   PT Goals (Current goals can be found in the Care Plan section) Acute Rehab PT Goals PT Goal Formulation: With patient Time For Goal Achievement: 04/13/2015 Potential to Achieve Goals: Good    Frequency Min 3X/week   Barriers to discharge        Co-evaluation               End of Session Equipment Utilized During Treatment: Gait belt Activity Tolerance: Patient tolerated treatment well Patient left: in bed;with call bell/phone within reach;with bed alarm set;with family/visitor present           Time: 1411-1433 PT Time Calculation (min) (ACUTE ONLY): 22 min   Charges:   PT Evaluation $Initial PT Evaluation Tier I: 1 Procedure     PT G Codes:        [name] [name],[name] [name] 04/02/2015, 3:54 PM [name] [name], PT, DPT 04/02/2015 Pager: [phone]

## 2015-04-02 NOTE — Progress Notes (Signed)
Patient ID: [name] [name], female   DOB: [birth year], 80 y.o.   MRN: [id number]    Referring Physician(s): Ennever/CCS  Chief Complaint: Weakness, lung / bilateral adrenal masses   Subjective: Pt familiar to IR service from recent BM biopsy on 03/19/15. She is a 80 y.o. female with history of chronic leukopenia, thrombocytopenia, IgM MGUS, remote breast cancer, who recently presented to [facility] with worsening fatigue, anorexia, back pain, weight loss, syncope and dehydration. Imaging studies have revealed RLL lung mass, bilateral adrenal masses, splenomegaly, and gastric wall thickening. Request received during initial admission for adrenal vs lung mass biopsy; however pt's plt count was too low and PET recommended. Request again received for adrenal mass biopsy. Platelet count currently 101k. Pt continues to be weak and mildly confused now with slow responses to questions and mild expressive aphasia. MRI brain 03/31/15 with no acute changes.    Allergies: Review of patient's allergies indicates no known allergies.  Medications: Prior to Admission medications   Medication Sig Start Date End Date Taking? Authorizing Provider  escitalopram (LEXAPRO) 10 MG tablet Take 5 mg by mouth every morning.    Yes Historical Provider, MD  feeding supplement, ENSURE ENLIVE, (ENSURE ENLIVE) LIQD Take 237 mLs by mouth 2 (two) times daily between meals. 03/21/15  Yes Belkys A Regalado, MD  fish oil-omega-3 fatty acids 1000 MG capsule Take 2 g by mouth every morning.    Yes Historical Provider, MD  Multiple Vitamin (MULTIVITAMIN) capsule Take 1 capsule by mouth every morning.    Yes Historical Provider, MD  propranolol ER (INDERAL LA) 60 MG 24 hr capsule Take 1 capsule (60 mg total) by mouth every morning. 03/22/15  Yes Belkys A Regalado, MD  raloxifene (EVISTA) 60 MG tablet Take 60 mg by mouth every morning.    Yes Historical Provider, MD  rosuvastatin (CRESTOR) 20 MG tablet Take 10 mg by mouth every morning.    Yes  Historical Provider, MD  Vitamin D, Ergocalciferol, (DRISDOL) 50000 UNITS CAPS Take 50,000 Units by mouth every Monday.    Yes Historical Provider, MD  aspirin EC 81 MG tablet Take 81 mg by mouth once a week.     Historical Provider, MD     Vital Signs: BP 128/63 mmHg  Pulse 70  Temp(Src) 98.8 F (37.1 C) (Oral)  Resp 16  Ht 5\' 8"  (1.727 m)  Wt 140 lb 6.9 oz (63.7 kg)  BMI 21.36 kg/m2  SpO2 100%  Physical Exam pt awake but a little lethargic , sister in room; chest- CTA bilat; heart- RRR; abd- soft,+BS,NT; ext- trace LE edema; moves all fours ok; no drift, face symm, strength ok, sens ok, alert to person, place, year but not day of week  Imaging: Dg Chest 2 View  03/30/2015   CLINICAL DATA:  Cough.  Weakness.  Not eating.  EXAM: CHEST  2 VIEW  COMPARISON:  03/14/2015  FINDINGS: 1.7 mm nodular opacity in the right lung base corresponding to metastatic lesions seen on CT chest 03/15/2015. Mild cardiac enlargement. No pulmonary vascular congestion. No focal consolidation in the lungs. No blunting of costophrenic angles. No pneumothorax. Mediastinal contours appear intact. Surgical clips in the left axilla. Degenerative changes in the spine. Degenerative changes in the shoulders.  IMPRESSION: Right lower lung nodule corresponding to metastatic lesions seen on previous CT chest. No acute infiltration or consolidation.   Electronically Signed   By: Lucienne Capers M.D.   On: 03/30/2015 21:17   Ct Head Wo Contrast  03/30/2015  CLINICAL DATA:  Patient is weekend not eating. Slurred speech. Recent hospitalizations but last ED visit was discharged home.  EXAM: CT HEAD WITHOUT CONTRAST  TECHNIQUE: Contiguous axial images were obtained from the base of the skull through the vertex without intravenous contrast.  COMPARISON:  03/14/2015  FINDINGS: Diffuse cerebral atrophy. Low-attenuation changes in the deep white matter probably representing central atrophy. Focal old areas of encephalomalacia  demonstrated in the cerebellar hemispheres bilaterally, in the frontal lobes bilaterally, and in the left posterior parietal region. These are probably areas of old ischemia. However, mass lesions or acute process not entirely excluded. Consider MRI for further evaluation if clinically indicated. No mass effect or midline shift. No abnormal extra-axial fluid collections. Gray-white matter junctions are distinct. Basal cisterns are not effaced. No evidence of acute intracranial hemorrhage. No ventricular dilatation. Calvarium appears intact. Paranasal sinuses are clear. Mastoid air cells are not opacified.  IMPRESSION: Multifocal low-attenuation areas are probably due to old ischemia abut acute process or metastatic lesions are not excluded. Consider further evaluation with MRI if clinically indicated. No acute intracranial hemorrhage or mass effect.   Electronically Signed   By: Lucienne Capers M.D.   On: 03/30/2015 22:22   Mr [name] [name] HU Contrast  03/31/2015   CLINICAL DATA:  New onset of generalized weakness and difficulty speaking. Personal history breast cancer.  EXAM: MRI HEAD WITHOUT AND WITH CONTRAST  TECHNIQUE: Multiplanar, multiecho pulse sequences of the brain and surrounding structures were obtained without and with intravenous contrast.  CONTRAST:  44mL MULTIHANCE GADOBENATE DIMEGLUMINE 529 MG/ML IV SOLN  COMPARISON:  CT of the head 03/30/2015 and 09/12/2010.  FINDINGS: The diffusion-weighted images demonstrate no evidence for acute or subacute infarction. No acute hemorrhage or mass lesion is present. The ventricles are of normal size. No significant extra-axial fluid collection is present.  The basal ganglia and brainstem are within normal limits. Flow is present in the major intracranial arteries. The globes and orbits are intact. The paranasal sinuses and mastoid air cells are clear the skullbase is within normal limits. Midline structures are unremarkable.  The postcontrast images demonstrate no  pathologic enhancement.  IMPRESSION: 1. No acute or focal lesion to explain the patient's acute symptoms. 2. Mild atrophy is within normal limits for age. There is no significant white matter disease.   Electronically Signed   By: San Morelle M.D.   On: 03/31/2015 11:35    Labs:  CBC:  Recent Labs  03/30/15 2025 03/30/15 2035 03/31/15 0346 04/01/15 0914 04/02/15 0458  WBC 2.1*  --  2.0* 1.8* 1.3*  HGB 10.2* 10.2* 9.7* 10.9* 9.5*  HCT 29.1* 30.0* 29.1* 30.9* 28.3*  PLT 129*  --  124* 104* 101*    COAGS:  Recent Labs  03/14/15 1905 03/30/15 2025  INR 1.23 1.34  APTT  --  28    BMP:  Recent Labs  03/31/15 0346 04/01/15 0429 04/01/15 0914 04/02/15 0458  NA 132* 135 134* 132*  K 3.6 4.4 4.3 3.7  CL 99* 104 105 100*  CO2 21* 16* 18* 25  GLUCOSE 83 77 78 118*  BUN 15 11 10 7   CALCIUM 8.5* 8.7* 9.0 8.5*  CREATININE 1.07* 0.84 0.92 0.86  GFRNONAA 48* >60 58* >60  GFRAA 56* >60 >60 >60    LIVER FUNCTION TESTS:  Recent Labs  03/14/15 1905 03/15/15 0605 03/30/15 2025 03/31/15 0346  BILITOT 0.6 0.3 1.5* 1.1  AST 28 27 31 26   ALT 14 12* 11* 9*  ALKPHOS  60 55 116 99  PROT 6.2* 5.0* 6.5 5.6*  ALBUMIN 3.3* 2.7* 3.1* 2.7*    Assessment and Plan: [name] [name] is a 80 y.o. female with history of chronic leukopenia, thrombocytopenia, IgM MGUS, remote breast cancer, who recently presented to [facility] with worsening fatigue, anorexia, back pain, weight loss, syncope and dehydration. Imaging studies have revealed RLL lung mass, bilateral adrenal masses, splenomegaly, and gastric wall thickening . Request now received for CT guided adrenal mass biopsy. Imaging studies were reviewed by Dr. Laurence Ferrari and he feels left adrenal mass is amenable to biopsy. Risks and benefits discussed with the patient/sister/daughter including, but not limited to bleeding, infection, damage to adjacent structures or low yield requiring additional tests.All of the patient's questions were  answered, patient is agreeable to proceed.Consent signed and in chart.Procedure tent planned for 9/14.      Signed: D. Rowe Robert 04/02/2015, 4:20 PM   I spent a total of 15 minutes at the the patient's bedside AND on the patient's hospital floor or unit, greater than 50% of which was counseling/coordinating care for CT guided left adrenal mass biopsy

## 2015-04-02 NOTE — Progress Notes (Signed)
PROGRESS NOTE    [name] [name] RAQ:[id number] DOB: [birth year] DOA: 03/30/2015 PCP: Jerlyn Ly, MD  Oncologist: Dr. Marin Olp  HPI/Brief narrative 80 year old female with history of HTN, HLD, diet-controlled DM 2, depression, MGUS, breast cancer status post mastectomy 1981, pancytopenia, hospitalized 8/25-03/21/15 for syncope at which time workup showed possible metastatic disease with lung nodules/adrenal nodules/liver lesions, admitted again to [facility] on 03/30/15 with complaints of weakness, poor oral intake, several days difficulty speaking but no unilateral weakness or visual abnormalities. Mild diarrhea. In the ED, pancytopenic and CT head shows multiple low-attenuation areas probably due to old ischemia but acute process or metastatic lesions not excluded. MRI brain _ no acute findings. She remains confused, extremely weak and poor appetite. Oncology consulted > recommend Adrenal lesion Bx - [facility] initially consulted who have asked IR for same.   Assessment/Plan:  Altered MS - CT head findings as below. - MRI brain negative for metastatic disease or acute findings. PET scan can be performed as outpatient. - No obvious dysarthria or facial asymmetry. Passed bedside swallow screen. - Unclear reason as to patient's presentation. Urine microscopy and chest x-ray without infectious process. Less likely to be TIA given duration of symptoms.? Related to dehydration. Monitor. - Discussed extensively with daughter [name] on 9/12: did OK for 3 days post recent DC and since then rapid decline with poor PO intake, generalized weakness, inability to walk and confusion. She doesn't think mom has made any improvement since admission. No h/o slurred speech or asymmetric weakness at any time - HIV: neg, RPR: non reactive, TSH 4.4019 and B 12: 1453 - also could be due to paraneoplastic etiology - BCx's: neg to date.  Pancytopenia - Unclear etiology.  - Dr. Marin Olp, Oncology input appreciated>  requesting Adrenal Lesion Bx for definitive tissue Bx.  Dehydration with hyponatremia - Continued poor oral intake. Continue IV normal saline hydration  MGUS - Oncology working up further  Chronic diastolic CHF - Not on diuretics at home. Compensated.  Diet-controlled DM 2  Hyperlipidemia - Continue statins  Diarrhea - If no further diarrhea reported, DC contact isolation and plans for C. difficile workup  Essential hypertension - Controlled with propranolol  Recent hospitalization for syncope, orthostatic hypotension, acute renal failure and abnormal troponins  NAG - bicarbonate. Better    DVT prophylaxis: Subcutaneous heparin Code Status: Full Family Communication: Discussed extensively with daughter [name] on 04/01/2015. Disposition Plan: DC home when medically stable   Consultants:  Oncology  CCS  IR  Procedures:  None  Antibiotics:  None   Subjective: Confused. Denies complaints.  Objective: Filed Vitals:   04/01/15 0505 04/01/15 1449 04/01/15 2014 04/02/15 0448  BP: 123/45 98/49 122/35 122/48  Pulse: 71 69 70 73  Temp: 98.9 F (37.2 C) 98.7 F (37.1 C) 97.4 F (36.3 C) 99 F (37.2 C)  TempSrc: Oral Oral Axillary Oral  Resp: 18 18 17 17   Height:      Weight:    63.7 kg (140 lb 6.9 oz)  SpO2: 100% 98% 100% 100%    Intake/Output Summary (Last 24 hours) at 04/02/15 0703 Last data filed at 04/01/15 2300  Gross per 24 hour  Intake    539 ml  Output      0 ml  Net    539 ml   Filed Weights   03/31/15 0117 04/02/15 0448  Weight: 60.918 kg (134 lb 4.8 oz) 63.7 kg (140 lb 6.9 oz)     Exam:  General exam: Pleasant  elderly female lying comfortably supine in bed. Chronically ill looking. Dry oral mucosa. Respiratory system: Clear. No increased work of breathing. Cardiovascular system: S1 & S2 heard, RRR. No JVD, murmurs, gallops, clicks or pedal edema.  Gastrointestinal system: Abdomen is nondistended, soft and nontender. Normal  bowel sounds heard. Central nervous system: Alert and oriented only to self. No focal neurological deficits. Extremities: Symmetric 5 x 5 power.   Data Reviewed: Basic Metabolic Panel:  Recent Labs Lab 03/30/15 2025 03/30/15 2035 04-23-2015 0346 04/01/15 0429 04/01/15 0914 04/02/15 0458  NA 129* 130* 132* 135 134* 132*  K 3.7 3.6 3.6 4.4 4.3 3.7  CL 95* 94* 99* 104 105 100*  CO2 21*  --  21* 16* 18* 25  GLUCOSE 90 88 83 77 78 118*  BUN 16 15 15 11 10 7   CREATININE 0.93 1.10* 1.07* 0.84 0.92 0.86  CALCIUM 8.9  --  8.5* 8.7* 9.0 8.5*   Liver Function Tests:  Recent Labs Lab 03/30/15 2025 2015/04/23 0346  AST 31 26  ALT 11* 9*  ALKPHOS 116 99  BILITOT 1.5* 1.1  PROT 6.5 5.6*  ALBUMIN 3.1* 2.7*   No results for input(s): LIPASE, AMYLASE in the last 168 hours. No results for input(s): AMMONIA in the last 168 hours. CBC:  Recent Labs Lab 03/30/15 2025 03/30/15 2035 2015-04-23 0346 04/01/15 0914 04/02/15 0458  WBC 2.1*  --  2.0* 1.8* 1.3*  NEUTROABS 1.6*  --   --   --  0.9*  HGB 10.2* 10.2* 9.7* 10.9* 9.5*  HCT 29.1* 30.0* 29.1* 30.9* 28.3*  MCV 92.7  --  90.7 96.3 91.9  PLT 129*  --  124* 104* 101*   Cardiac Enzymes: No results for input(s): CKTOTAL, CKMB, CKMBINDEX, TROPONINI in the last 168 hours. BNP (last 3 results) No results for input(s): PROBNP in the last 8760 hours. CBG:  Recent Labs Lab 2015/04/23 0720 04/01/15 0741  GLUCAP 81 74    Recent Results (from the past 240 hour(s))  Culture, blood (x 2)     Status: None (Preliminary result)   Collection Time: 04-23-2015  1:30 AM  Result Value Ref Range Status   Specimen Description BLOOD RIGHT ARM  Final   Special Requests BOTTLES DRAWN AEROBIC ONLY 5CC  Final   Culture   Final    NO GROWTH 1 DAY Performed at [facility]    Report Status PENDING  Incomplete  Culture, blood (x 2)     Status: None (Preliminary result)   Collection Time: 04/23/2015  1:50 AM  Result Value Ref Range Status    Specimen Description BLOOD RIGHT HAND  Final   Special Requests BOTTLES DRAWN AEROBIC ONLY 5CC  Final   Culture   Final    NO GROWTH 1 DAY Performed at [facility]    Report Status PENDING  Incomplete         Studies: Mr Jeri Cos Wo Contrast  23-Apr-2015   CLINICAL DATA:  New onset of generalized weakness and difficulty speaking. Personal history breast cancer.  EXAM: MRI HEAD WITHOUT AND WITH CONTRAST  TECHNIQUE: Multiplanar, multiecho pulse sequences of the brain and surrounding structures were obtained without and with intravenous contrast.  CONTRAST:  71mL MULTIHANCE GADOBENATE DIMEGLUMINE 529 MG/ML IV SOLN  COMPARISON:  CT of the head 03/30/2015 and 09/12/2010.  FINDINGS: The diffusion-weighted images demonstrate no evidence for acute or subacute infarction. No acute hemorrhage or mass lesion is present. The ventricles are of normal size. No significant  extra-axial fluid collection is present.  The basal ganglia and brainstem are within normal limits. Flow is present in the major intracranial arteries. The globes and orbits are intact. The paranasal sinuses and mastoid air cells are clear the skullbase is within normal limits. Midline structures are unremarkable.  The postcontrast images demonstrate no pathologic enhancement.  IMPRESSION: 1. No acute or focal lesion to explain the patient's acute symptoms. 2. Mild atrophy is within normal limits for age. There is no significant white matter disease.   Electronically Signed   By: San Morelle M.D.   On: 03/31/2015 11:35        Scheduled Meds: . antiseptic oral rinse  7 mL Mouth Rinse q12n4p  . aspirin EC  81 mg Oral Weekly  . chlorhexidine  15 mL Mouth Rinse BID  . escitalopram  5 mg Oral q morning - 10a  . feeding supplement (ENSURE ENLIVE)  237 mL Oral BID BM  . heparin  5,000 Units Subcutaneous 3 times per day  . multivitamin with minerals  1 tablet Oral Daily  . omega-3 acid ethyl esters  2 g Oral Daily  .  propranolol ER  60 mg Oral q morning - 10a  . raloxifene  60 mg Oral q morning - 10a  . rosuvastatin  10 mg Oral q morning - 10a  . sodium chloride  3 mL Intravenous Q12H   Continuous Infusions: .  sodium bicarbonate  infusion 1000 mL 60 mL/hr at 04/01/15 1531    Principal Problem:   Difficulty speaking Active Problems:   MGUS (monoclonal gammopathy of unknown significance)   Adrenal nodule   Chronic diastolic heart failure   Pulmonary nodules/lesions, multiple   DM type 2 (diabetes mellitus, type 2)   Pancytopenia   Generalized weakness   HLD (hyperlipidemia)   Depression   Slurred speech   Protein calorie malnutrition   Diarrhea    Time spent: 30 minutes.    Vernell Leep, MD, FACP, FHM. [facility] Pager [phone]  If 7PM-7AM, please contact night-coverage [email or web address] Password [email or web address] 04/02/2015, 7:03 AM    LOS: 3 days

## 2015-04-02 NOTE — Consult Note (Signed)
Referral MD  Reason for Referral: Pancytopenia, MGUS, adrenal masses   Chief Complaint  Patient presents with  . Weakness  . Not Eating   : She really cannot give any history.  HPI: [name] [name] is well-known to me. I've been following her for many years. She has this IgM MGUS. She was hospitalized recently. She is not have a lung lesion and bilateral adrenal lesions. She was have a workup was not outpatient. However,, she probably get weak at home. I note there is confusion. She was remitted. An MRI the brain was unremarkable.  When  she was last in the hospital, there was no M spike detected in the blood.   Her labs today show that her white count is dropping. It is 1.3. Hemoglobin 9.5. Her platelet count is 101,000. Her bone marrow that she had done when she was admitted really was unrevealing. She had 10% plasma cells.  She really is more lethargic this morning. She was not sure why she was in the hospital. She did not know how long she was in the hospital.  She's not hurting. There is no cough. She's had no change in bowel or bladder habits. She's had no obvious rashes.    Past Medical History  Diagnosis Date  . Abnormal WBC count     low, h/o  . Other and unspecified hyperlipidemia   . Unspecified essential hypertension   . OP (osteoporosis)   . Vitamin D deficiency   . MGUS (monoclonal gammopathy of unknown significance) 03/16/2013  . Back pain   . Breast cancer 1981    mastectomy and lymph node removal  . DM type 2 (diabetes mellitus, type 2)     Pt states she is 'prediabetic'  :  Past Surgical History  Procedure Laterality Date  . Mastectomy      left  . Breast biopsy      right  . Breast reconstruction    . Skin cancer removal      basal cell skin cancer  . Wisdom tooth extraction    . Tooth extraction    :   Current facility-administered medications:  .  acetaminophen (TYLENOL) tablet 650 mg, 650 mg, Oral, Q6H PRN **OR** acetaminophen (TYLENOL) suppository  650 mg, 650 mg, Rectal, Q6H PRN, Ivor Costa, MD .  antiseptic oral rinse (CPC / CETYLPYRIDINIUM CHLORIDE 0.05%) solution 7 mL, 7 mL, Mouth Rinse, q12n4p, Ivor Costa, MD, 7 mL at 04/01/15 1600 .  aspirin EC tablet 81 mg, 81 mg, Oral, Weekly, Ivor Costa, MD, 81 mg at 03/31/15 1230 .  chlorhexidine (PERIDEX) 0.12 % solution 15 mL, 15 mL, Mouth Rinse, BID, Ivor Costa, MD, 15 mL at 04/01/15 2143 .  escitalopram (LEXAPRO) tablet 5 mg, 5 mg, Oral, q morning - 10a, Ivor Costa, MD, 5 mg at 04/01/15 0921 .  feeding supplement (ENSURE ENLIVE) (ENSURE ENLIVE) liquid 237 mL, 237 mL, Oral, BID BM, Ivor Costa, MD, 237 mL at 04/01/15 1210 .  heparin injection 5,000 Units, 5,000 Units, Subcutaneous, 3 times per day, Ivor Costa, MD, 5,000 Units at 04/02/15 [phone] .  hydrOXYzine (VISTARIL) injection 25 mg, 25 mg, Intramuscular, Q6H PRN, Ivor Costa, MD .  multivitamin with minerals tablet 1 tablet, 1 tablet, Oral, Daily, Ivor Costa, MD, 1 tablet at 04/01/15 0920 .  omega-3 acid ethyl esters (LOVAZA) capsule 2 g, 2 g, Oral, Daily, Ivor Costa, MD, 2 g at 04/01/15 0921 .  promethazine (PHENERGAN) tablet 12.5 mg, 12.5 mg, Oral, Q8H PRN, Lenis Dickinson Hongalgi,  MD, 12.5 mg at 04/01/15 0937 .  propranolol ER (INDERAL LA) 24 hr capsule 60 mg, 60 mg, Oral, q morning - 10a, Ivor Costa, MD, 60 mg at 04/01/15 0920 .  raloxifene (EVISTA) tablet 60 mg, 60 mg, Oral, q morning - 10a, Ivor Costa, MD, 60 mg at 04/01/15 0920 .  rosuvastatin (CRESTOR) tablet 10 mg, 10 mg, Oral, q morning - 10a, Ivor Costa, MD, 10 mg at 04/01/15 0921 .  sodium bicarbonate 100 mEq in dextrose 5 % 1,000 mL infusion, , Intravenous, Continuous, Modena Jansky, MD, Last Rate: 60 mL/hr at 04/01/15 1531 .  sodium chloride 0.9 % injection 3 mL, 3 mL, Intravenous, Q12H, Ivor Costa, MD, 3 mL at 03/31/15 0100:  . antiseptic oral rinse  7 mL Mouth Rinse q12n4p  . aspirin EC  81 mg Oral Weekly  . chlorhexidine  15 mL Mouth Rinse BID  . escitalopram  5 mg Oral q morning - 10a  .  feeding supplement (ENSURE ENLIVE)  237 mL Oral BID BM  . heparin  5,000 Units Subcutaneous 3 times per day  . multivitamin with minerals  1 tablet Oral Daily  . omega-3 acid ethyl esters  2 g Oral Daily  . propranolol ER  60 mg Oral q morning - 10a  . raloxifene  60 mg Oral q morning - 10a  . rosuvastatin  10 mg Oral q morning - 10a  . sodium chloride  3 mL Intravenous Q12H  :  No Known Allergies:  Family History  Problem Relation Age of Onset  . Liver cancer Father   . Diabetes Father   :  Social History   Social History  . Marital Status: Widowed    Spouse Name: N/A  . Number of Children: 2  . Years of Education: N/A   Occupational History  . Not on file.   Social History Main Topics  . Smoking status: Former Smoker -- 1.00 packs/day for 10 years    Types: Cigarettes    Start date: 05/15/1956    Quit date: 07/05/1966  . Smokeless tobacco: Never Used     Comment: quit 45 years ago  . Alcohol Use: No  . Drug Use: No  . Sexual Activity: No   Other Topics Concern  . Not on file   Social History Narrative  :  Pertinent items are noted in HPI.  Exam: Patient Vitals for the past 24 hrs:  BP Temp Temp src Pulse Resp SpO2 Weight  04/02/15 0448 (!) 122/48 mmHg 99 F (37.2 C) Oral 73 17 100 % 140 lb 6.9 oz (63.7 kg)  04/01/15 2014 (!) 122/35 mmHg 97.4 F (36.3 C) Axillary 70 17 100 % -  04/01/15 1449 (!) 98/49 mmHg 98.7 F (37.1 C) Oral 69 18 98 % -    as above    Recent Labs  04/01/15 0914 04/02/15 0458  WBC 1.8* 1.3*  HGB 10.9* 9.5*  HCT 30.9* 28.3*  PLT 104* 101*    Recent Labs  04/01/15 0914 04/02/15 0458  NA 134* 132*  K 4.3 3.7  CL 105 100*  CO2 18* 25  GLUCOSE 78 118*  BUN 10 7  CREATININE 0.92 0.86  CALCIUM 9.0 8.5*    Blood smear review: None  Pathology: None     Assessment and Plan: [name] [name] is a 80 year old white female. It is unclear as to what is going on with her.  She's had a remote history of breast cancer. I just  cannot  imagine her having recurrent disease.  MRI of the brain that she had was unremarkable. However, I wonder she does not have some kind of paraneoplastic syndrome causing some of this neurological problem.  I think that the "bottom line" is that we have to get a tissue diagnosis. As such, as much as I would he do say this, I really believe that she needs to have one of these adrenal lesions biopsied. I think this can be done laparoscopically.  Now that she is inpatient, we cannot do a PET scan on her. However, I would think that these would be positive. Regardless, a tissue diagnosis is what we need to do. I think a laparoscopic approach would be the best and would be oh to get tissue.  I probably would put her on Solu-Cortef for possible adrenal insufficiency.  I just cannot think of any other way that week and go about trying to help her out. We have to figure out what malignancy we are dealing with. She has his MGUS. Her bone marrow was unrevealing.  I will have to speak with her son by phone.  Appreciate all the help that she is getting up on 4 W.  [name].  Isaiah 12:2

## 2015-04-02 NOTE — Progress Notes (Signed)
OT Cancellation Note  Patient Details Name: [name] [name] MRN: [id number] DOB: 02-Feb-1935   Cancelled Treatment:    Reason Eval/Treat Not Completed: Fatigue/lethargy limiting ability to participate - Pt sleeping soundly.  Unable to awaken her despite max attempts.  Will reattempt OT eval tomorrow.   Darlina Rumpf Claude, OTR/L [phone]  04/02/2015, 3:22 PM

## 2015-04-03 ENCOUNTER — Inpatient Hospital Stay (HOSPITAL_COMMUNITY): Payer: Medicare Other

## 2015-04-03 ENCOUNTER — Ambulatory Visit (HOSPITAL_COMMUNITY): Payer: Medicare Other

## 2015-04-03 DIAGNOSIS — I313 Pericardial effusion (noninflammatory): Secondary | ICD-10-CM | POA: Diagnosis present

## 2015-04-03 DIAGNOSIS — I3139 Other pericardial effusion (noninflammatory): Secondary | ICD-10-CM | POA: Diagnosis present

## 2015-04-03 DIAGNOSIS — E279 Disorder of adrenal gland, unspecified: Secondary | ICD-10-CM

## 2015-04-03 DIAGNOSIS — D61818 Other pancytopenia: Secondary | ICD-10-CM | POA: Diagnosis not present

## 2015-04-03 DIAGNOSIS — I319 Disease of pericardium, unspecified: Secondary | ICD-10-CM

## 2015-04-03 DIAGNOSIS — I2510 Atherosclerotic heart disease of native coronary artery without angina pectoris: Secondary | ICD-10-CM | POA: Diagnosis present

## 2015-04-03 LAB — CBC WITH DIFFERENTIAL/PLATELET
BASOS PCT: 0 %
Basophils Absolute: 0 10*3/uL (ref 0.0–0.1)
Eosinophils Absolute: 0 10*3/uL (ref 0.0–0.7)
Eosinophils Relative: 0 %
HEMATOCRIT: 29.3 % — AB (ref 36.0–46.0)
HEMOGLOBIN: 10.2 g/dL — AB (ref 12.0–15.0)
LYMPHS PCT: 8 %
Lymphs Abs: 0.1 10*3/uL — ABNORMAL LOW (ref 0.7–4.0)
MCH: 32.2 pg (ref 26.0–34.0)
MCHC: 34.8 g/dL (ref 30.0–36.0)
MCV: 92.4 fL (ref 78.0–100.0)
MONO ABS: 0.1 10*3/uL (ref 0.1–1.0)
MONOS PCT: 8 %
NEUTROS ABS: 1 10*3/uL — AB (ref 1.7–7.7)
NEUTROS PCT: 84 %
Platelets: 122 10*3/uL — ABNORMAL LOW (ref 150–400)
RBC: 3.17 MIL/uL — ABNORMAL LOW (ref 3.87–5.11)
RDW: 14.1 % (ref 11.5–15.5)
WBC: 1.2 10*3/uL — CL (ref 4.0–10.5)

## 2015-04-03 LAB — PROTEIN ELECTROPHORESIS, SERUM
A/G Ratio: 0.9 (ref 0.7–1.7)
Albumin ELP: 2.5 g/dL — ABNORMAL LOW (ref 2.9–4.4)
Alpha-1-Globulin: 0.4 g/dL (ref 0.0–0.4)
Alpha-2-Globulin: 0.8 g/dL (ref 0.4–1.0)
BETA GLOBULIN: 0.6 g/dL — AB (ref 0.7–1.3)
GAMMA GLOBULIN: 0.9 g/dL (ref 0.4–1.8)
Globulin, Total: 2.7 g/dL (ref 2.2–3.9)
M-Spike, %: 0.6 g/dL — ABNORMAL HIGH
TOTAL PROTEIN ELP: 5.2 g/dL — AB (ref 6.0–8.5)

## 2015-04-03 LAB — KAPPA/LAMBDA LIGHT CHAINS
KAPPA FREE LGHT CHN: 459.29 mg/L — AB (ref 3.30–19.40)
KAPPA, LAMDA LIGHT CHAIN RATIO: 40.5 — AB (ref 0.26–1.65)
LAMDA FREE LIGHT CHAINS: 11.34 mg/L (ref 5.71–26.30)

## 2015-04-03 LAB — IMMUNOFIXATION ELECTROPHORESIS
IgA: 73 mg/dL (ref 64–422)
IgG (Immunoglobin G), Serum: 492 mg/dL — ABNORMAL LOW (ref 700–1600)
IgM, Serum: 678 mg/dL — ABNORMAL HIGH (ref 26–217)
TOTAL PROTEIN ELP: 5.3 g/dL — AB (ref 6.0–8.5)

## 2015-04-03 LAB — PROTIME-INR
INR: 1.26 (ref 0.00–1.49)
PROTHROMBIN TIME: 16 s — AB (ref 11.6–15.2)

## 2015-04-03 LAB — GLUCOSE, CAPILLARY: GLUCOSE-CAPILLARY: 162 mg/dL — AB (ref 65–99)

## 2015-04-03 LAB — LACTATE DEHYDROGENASE: LDH: 98 U/L (ref 98–192)

## 2015-04-03 LAB — IGG, IGA, IGM
IGA: 69 mg/dL (ref 64–422)
IGM, SERUM: 697 mg/dL — AB (ref 26–217)
IgG (Immunoglobin G), Serum: 490 mg/dL — ABNORMAL LOW (ref 700–1600)

## 2015-04-03 MED ORDER — REGADENOSON 0.4 MG/5ML IV SOLN
0.4000 mg | Freq: Once | INTRAVENOUS | Status: AC
Start: 2015-04-04 — End: 2015-04-04
  Administered 2015-04-04: 0.4 mg via INTRAVENOUS
  Filled 2015-04-03: qty 5

## 2015-04-03 MED ORDER — PNEUMOCOCCAL VAC POLYVALENT 25 MCG/0.5ML IJ INJ
0.5000 mL | INJECTION | INTRAMUSCULAR | Status: DC
  Filled 2015-04-03 (×2): qty 0.5

## 2015-04-03 MED ORDER — REGADENOSON 0.4 MG/5ML IV SOLN
0.4000 mg | Freq: Once | INTRAVENOUS | Status: DC
  Filled 2015-04-03: qty 5

## 2015-04-03 MED ORDER — MIDAZOLAM HCL 2 MG/2ML IJ SOLN
INTRAMUSCULAR | Status: AC
  Filled 2015-04-03: qty 2

## 2015-04-03 MED ORDER — FENTANYL CITRATE (PF) 100 MCG/2ML IJ SOLN
INTRAMUSCULAR | Status: AC | PRN
  Administered 2015-04-03: 25 ug via INTRAVENOUS

## 2015-04-03 MED ORDER — FILGRASTIM 480 MCG/1.6ML IJ SOLN
480.0000 ug | Freq: Once | INTRAMUSCULAR | Status: AC
  Administered 2015-04-03: 480 ug via SUBCUTANEOUS
  Filled 2015-04-03: qty 1.6

## 2015-04-03 MED ORDER — MIDAZOLAM HCL 2 MG/2ML IJ SOLN
INTRAMUSCULAR | Status: AC | PRN
  Administered 2015-04-03 (×2): 0.5 mg via INTRAVENOUS

## 2015-04-03 MED ORDER — MENINGOCOCCAL VAC A,C,Y,W-135 ~~LOC~~ INJ
0.5000 mL | INJECTION | Freq: Once | SUBCUTANEOUS | Status: AC
  Administered 2015-04-03: 0.5 mL via SUBCUTANEOUS
  Filled 2015-04-03: qty 0.5

## 2015-04-03 MED ORDER — FENTANYL CITRATE (PF) 100 MCG/2ML IJ SOLN
INTRAMUSCULAR | Status: AC
  Filled 2015-04-03: qty 2

## 2015-04-03 NOTE — Evaluation (Signed)
Occupational Therapy Evaluation Patient Details Name: [name] [name] MRN: [id number] DOB: 11/13/1934 Today's Date: 04/03/2015    History of Present Illness 80 y/o female who was admitted on 03/30/15 for mental status changes, weakness, difficulty speaking. She has a PMH significant for MGUS, hospitalized 8/25-03/21/15 for syncope at which time workup showed possible metastatic disease with lung nodules/adrenal nodules/liver lesions, pancytopenia, diastolic CHF, DM2, HLD, and depression   Clinical Impression   Pt admitted with AMS. Pt currently with functional limitations due to the deficits listed below (see OT Problem List).  Pt will benefit from skilled OT to increase their safety and independence with ADL and functional mobility for ADL to facilitate discharge to venue listed below.      Follow Up Recommendations  SNF    Equipment Recommendations  None recommended by OT    Recommendations for Other Services       Precautions / Restrictions Precautions Precautions: Fall Restrictions Weight Bearing Restrictions: No      Mobility Bed Mobility Overal bed mobility: Needs Assistance Bed Mobility: Supine to Sit     Supine to sit: Mod assist;HOB elevated        Transfers Overall transfer level: Needs assistance Equipment used: Rolling walker (2 wheeled) Transfers: Sit to/from Stand Sit to Stand: Mod assist         General transfer comment: verbal cues for safe technique, assist to rise and steady    Balance                                            ADL Overall ADL's : Needs assistance/impaired Eating/Feeding: Set up;Sitting   Grooming: Set up;Sitting   Upper Body Bathing: Minimal assitance;Sitting   Lower Body Bathing: Maximal assistance;Sit to/from stand;Cueing for safety   Upper Body Dressing : Set up;Sitting   Lower Body Dressing: Maximal assistance;Sit to/from stand;Cueing for safety               Functional mobility during ADLs:  Moderate assistance       Vision     Perception     Praxis      Pertinent Vitals/Pain       Hand Dominance Right   Extremity/Trunk Assessment Upper Extremity Assessment Upper Extremity Assessment: Generalized weakness           Communication Communication Communication: No difficulties   Cognition Arousal/Alertness: Awake/alert Behavior During Therapy: WFL for tasks assessed/performed Overall Cognitive Status: Within Functional Limits for tasks assessed                     General Comments       Exercises       Shoulder Instructions      Home Living Family/patient expects to be discharged to:: Skilled nursing facility Living Arrangements: Alone Available Help at Discharge: Family;Friend(s);Available PRN/intermittently Type of Home: House Home Access: Stairs to enter [employer] of Steps: 2-3 Entrance Stairs-Rails: None Home Layout: One level     Bathroom Shower/Tub: Tub/shower unit         Home Equipment: None   Additional Comments: per previous admission      Prior Functioning/Environment Level of Independence: Independent        Comments: pt reports recently weaker and needing more assist    OT Diagnosis: Generalized weakness   OT Problem List: Decreased strength;Decreased activity tolerance;Impaired balance (sitting and/or standing)   OT  Treatment/Interventions: Self-care/ADL training;DME and/or AE instruction;Patient/family education    OT Goals(Current goals can be found in the care plan section) Acute Rehab OT Goals Patient Stated Goal: to figure out what is going on (scheduled for biopsy) OT Goal Formulation: With patient Time For Goal Achievement: 04/17/15  OT Frequency: Min 2X/week   Barriers to D/C: Decreased caregiver support          Co-evaluation              End of Session Equipment Utilized During Treatment: Rolling walker  Activity Tolerance: Patient tolerated treatment well Patient  left: in chair;with call bell/phone within reach;with family/visitor present   Time: 3546-5681 OT Time Calculation (min): 30 min Charges:  OT General Charges $OT Visit: 1 Procedure OT Evaluation $Initial OT Evaluation Tier I: 1 Procedure OT Treatments $Self Care/Home Management : 8-22 mins G-Codes:    Payton Mccallum D 2015/04/18, 12:01 PM

## 2015-04-03 NOTE — Progress Notes (Signed)
[name] [name] is much more alert today. Maybe the steroids that she is on are helping.  She was seen by surgery. They think that intervention radiology might be over get a biopsy.  I am concerned that there were just is not to be enough material that interventional radiology can get. I sincerely doubt that they can do biopsies of an adrenal nodule. I think anything they get would be aspirate material. With this, I don't know if there would be enough material for appropriate testing.  One consideration that actually had was to see if her spleen could come out since she has splenomegaly of unclear etiology. I think her spleen is causing some of the pancytopenia. I think getting her spleen out in addition to doing a full adrenal biopsy or adrenal resection would be a very good way of trying to make a diagnosis.  I will give her vaccinations today.  Her labs today show otherwise accounted be 1.2. Hemoglobin 10.2. Platelet count is 122,000. Her LDH is 101.  On her physical exam, her vital signs are all stable. Blood pressure is 125/50. She is afebrile. Her oral exam shows no mucositis or thrush. There is no adenopathy in the neck. Lungs are clear. Cardiac exam regular rate and rhythm. Abdomen is soft. Her spleen tip might be palpable at the left costal margin. There is no pattern megaly. Extremities shows no clubbing, cyanosis or edema.  We will see what interventional radiology can get. Again, I just would be concerned that with a get would be minimal for what we need. Unfortunately, this is one of the situations where I think tissue is very very important and I would just hate to have to get her through another procedure.  As always, I prayed about this. Her sister was with her today. I reviewed everything with them.  I am grateful for all the outstanding care that she is getting.  Lum Keas  Proverbs 3:5-6

## 2015-04-03 NOTE — Procedures (Signed)
Interventional Radiology Procedure Note  Procedure: CT guided core biopsy of LEFT adrenal mass.  Complications: None  Estimated Blood Loss: 0  Recommendations:  - Bedrest x 2 hrs - Path pending  Signed,  Criselda Peaches, MD

## 2015-04-03 NOTE — Progress Notes (Signed)
PT Cancellation Note  Patient Details Name: [name] [name] MRN: [id number] DOB: 1934-10-21   Cancelled Treatment:     bed rest for 2 hours after Biopsy.  Will attempt again as schedule permits.   Nathanial Rancher 04/03/2015, 2:42 PM

## 2015-04-03 NOTE — Consult Note (Signed)
Patient ID: [name] [name] MRN: [id number], DOB/AGE: [birth year]   Admit date: 03/30/2015   Primary Physician: Jerlyn Ly, MD Primary Cardiologist: New (Dr. Radford Pax)  Pt. Profile:  80 y/o female with no prior cardiac history, recently diagnosed with bilateral adrenal lesions with possible metastasis and known moderate sized pericardial effusion, being evaluated for surgical clearance.   Problem List  Past Medical History  Diagnosis Date  . Abnormal WBC count     low, h/o  . Other and unspecified hyperlipidemia   . Unspecified essential hypertension   . OP (osteoporosis)   . Vitamin D deficiency   . MGUS (monoclonal gammopathy of unknown significance) 03/16/2013  . Back pain   . Breast cancer 1981    mastectomy and lymph node removal  . DM type 2 (diabetes mellitus, type 2)     Pt states she is 'prediabetic'    Past Surgical History  Procedure Laterality Date  . Mastectomy      left  . Breast biopsy      right  . Breast reconstruction    . Skin cancer removal      basal cell skin cancer  . Wisdom tooth extraction    . Tooth extraction       Allergies  No Known Allergies  HPI  80 y/o female with a PMH significant for MGUS and breast CA s/p left mastectomy with lymph node removal in 1981. She was recently admitted for syncope x 2 03/14/15-03/21/15. 2D echo revealed normal LVF with an EF of 65-70% and G1DD. A moderate, free-flowing pericardial effusion was identified circumferential to the heart. The fluid had no internal echoes.There was no evidence of hemodynamic compromise. No cardiology consult was placed at that particular time. During that hospitalization she was also diagnosed with bilateral adrenal masses with possible metastased disease with lung and liver lesions also noted. She was suppose to f/u with Dr. Marin Olp for further management.  However she presented back to [facility] on9/10/16 with generalized weakness and difficulty speaking.  CT of the head showed no acute  intracranial hemorrhage MRI was also unremarkable. Decision was made to continue w/u for adrenal mass as an inpatient.   Cardiology has been asked to see for surgical clearance. Other than her pericardiac effusion, she has no prior cardiac history. She denies any h/o chest pain or MI. Her cardiac risk factors include treated HTN, HLD and DM. She is a former smoker, having quit over 50 years ago. No h/o stroke/TIA. No limitations with day to day activities including no exertional chest pain or dyspnea.  Her EKG shows low voltage QRS complexes, likely secondary to her effusion. However, there are no signs of ischemia. A repeat 2D echo was obtained 04/02/15 to reassess her effusion. Compared to the prior echo in 02/2015, there has been no change in the pericardial effusion. LVEF is lower, but still normal. EF 55-60%. There are no features of tamponade physiology. She denies any recurrent syncope/ near syncope. No dyspnea, orthopnea, PND or CP. She does note a dry, nonproductive cough. Vital signs are stable.    Home Medications  Prior to Admission medications   Medication Sig Start Date End Date Taking? Authorizing Provider  escitalopram (LEXAPRO) 10 MG tablet Take 5 mg by mouth every morning.    Yes Historical Provider, MD  feeding supplement, ENSURE ENLIVE, (ENSURE ENLIVE) LIQD Take 237 mLs by mouth 2 (two) times daily between meals. 03/21/15  Yes Belkys A Regalado, MD  fish oil-omega-3 fatty acids 1000 MG  capsule Take 2 g by mouth every morning.    Yes Historical Provider, MD  Multiple Vitamin (MULTIVITAMIN) capsule Take 1 capsule by mouth every morning.    Yes Historical Provider, MD  propranolol ER (INDERAL LA) 60 MG 24 hr capsule Take 1 capsule (60 mg total) by mouth every morning. 03/22/15  Yes Belkys A Regalado, MD  raloxifene (EVISTA) 60 MG tablet Take 60 mg by mouth every morning.    Yes Historical Provider, MD  rosuvastatin (CRESTOR) 20 MG tablet Take 10 mg by mouth every morning.    Yes  Historical Provider, MD  Vitamin D, Ergocalciferol, (DRISDOL) 50000 UNITS CAPS Take 50,000 Units by mouth every Monday.    Yes Historical Provider, MD  aspirin EC 81 MG tablet Take 81 mg by mouth once a week.     Historical Provider, MD    Family History  Family History  Problem Relation Age of Onset  . Liver cancer Father   . Diabetes Father     Social History  Social History   Social History  . Marital Status: Widowed    Spouse Name: N/A  . Number of Children: 2  . Years of Education: N/A   Occupational History  . Not on file.   Social History Main Topics  . Smoking status: Former Smoker -- 1.00 packs/day for 10 years    Types: Cigarettes    Start date: 05/15/1956    Quit date: 07/05/1966  . Smokeless tobacco: Never Used     Comment: quit 45 years ago  . Alcohol Use: No  . Drug Use: No  . Sexual Activity: No   Other Topics Concern  . Not on file   Social History Narrative     Review of Systems General:  No chills, fever, night sweats or weight changes.  Cardiovascular:  No chest pain, dyspnea on exertion, edema, orthopnea, palpitations, paroxysmal nocturnal dyspnea. Dermatological: No rash, lesions/masses Respiratory: No cough, dyspnea Urologic: No hematuria, dysuria Abdominal:   No nausea, vomiting, diarrhea, bright red blood per rectum, melena, or hematemesis Neurologic:  No visual changes, wkns, changes in mental status. All other systems reviewed and are otherwise negative except as noted above.  Physical Exam  Blood pressure 125/50, pulse 75, temperature 97.8 F (36.6 C), temperature source Oral, resp. rate 16, height 5' 8"  (1.727 m), weight 142 lb (64.411 kg), SpO2 98 %.  General: Pleasant, NAD Psych: Normal affect. Neuro: Alert and oriented X 3. Moves all extremities spontaneously. HEENT: Normal  Neck: Supple without bruits or JVD. Lungs:  Resp regular and unlabored, CTA. Heart: RRR no s3, s4, or murmurs. Abdomen: Soft, non-tender,  non-distended, BS + x 4.  Extremities: No clubbing, cyanosis or edema. DP/PT/Radials 2+ and equal bilaterally.  Labs  Troponin (Point of Care Test) No results for input(s): TROPIPOC in the last 72 hours. No results for input(s): CKTOTAL, CKMB, TROPONINI in the last 72 hours. Lab Results  Component Value Date   WBC 1.2* 04/03/2015   HGB 10.2* 04/03/2015   HCT 29.3* 04/03/2015   MCV 92.4 04/03/2015   PLT 122* 04/03/2015    Recent Labs Lab 03/31/15 0346  04/02/15 0458  NA 132*  < > 132*  K 3.6  < > 3.7  CL 99*  < > 100*  CO2 21*  < > 25  BUN 15  < > 7  CREATININE 1.07*  < > 0.86  CALCIUM 8.5*  < > 8.5*  PROT 5.6*  --   --  BILITOT 1.1  --   --   ALKPHOS 99  --   --   ALT 9*  --   --   AST 26  --   --   GLUCOSE 83  < > 118*  < > = values in this interval not displayed. Lab Results  Component Value Date   CHOL 110 03/31/2015   HDL 10* 03/31/2015   LDLCALC 67 03/31/2015   TRIG 163* 03/31/2015   Lab Results  Component Value Date   DDIMER 13.92* 03/14/2015     Radiology/Studies  Dg Chest 2 View  03/30/2015   CLINICAL DATA:  Cough.  Weakness.  Not eating.  EXAM: CHEST  2 VIEW  COMPARISON:  03/14/2015  FINDINGS: 1.7 mm nodular opacity in the right lung base corresponding to metastatic lesions seen on CT chest 03/15/2015. Mild cardiac enlargement. No pulmonary vascular congestion. No focal consolidation in the lungs. No blunting of costophrenic angles. No pneumothorax. Mediastinal contours appear intact. Surgical clips in the left axilla. Degenerative changes in the spine. Degenerative changes in the shoulders.  IMPRESSION: Right lower lung nodule corresponding to metastatic lesions seen on previous CT chest. No acute infiltration or consolidation.   Electronically Signed   By: Lucienne Capers M.D.   On: 03/30/2015 21:17   Ct Head Wo Contrast  03/30/2015   CLINICAL DATA:  Patient is weekend not eating. Slurred speech. Recent hospitalizations but last ED visit was discharged  home.  EXAM: CT HEAD WITHOUT CONTRAST  TECHNIQUE: Contiguous axial images were obtained from the base of the skull through the vertex without intravenous contrast.  COMPARISON:  03/14/2015  FINDINGS: Diffuse cerebral atrophy. Low-attenuation changes in the deep white matter probably representing central atrophy. Focal old areas of encephalomalacia demonstrated in the cerebellar hemispheres bilaterally, in the frontal lobes bilaterally, and in the left posterior parietal region. These are probably areas of old ischemia. However, mass lesions or acute process not entirely excluded. Consider MRI for further evaluation if clinically indicated. No mass effect or midline shift. No abnormal extra-axial fluid collections. Gray-white matter junctions are distinct. Basal cisterns are not effaced. No evidence of acute intracranial hemorrhage. No ventricular dilatation. Calvarium appears intact. Paranasal sinuses are clear. Mastoid air cells are not opacified.  IMPRESSION: Multifocal low-attenuation areas are probably due to old ischemia abut acute process or metastatic lesions are not excluded. Consider further evaluation with MRI if clinically indicated. No acute intracranial hemorrhage or mass effect.   Electronically Signed   By: Lucienne Capers M.D.   On: 03/30/2015 22:22   Ct Head Wo Contrast  03/14/2015   CLINICAL DATA:  [place] backwards today.  Loss of consciousness.  EXAM: CT HEAD WITHOUT CONTRAST  TECHNIQUE: Contiguous axial images were obtained from the base of the skull through the vertex without intravenous contrast.  COMPARISON:  09/12/2010.  FINDINGS: Diffusely enlarged ventricles and subarachnoid spaces. No skull fracture, intracranial hemorrhage or paranasal sinus air-fluid levels.  IMPRESSION: Mild atrophy.  No acute abnormality.   Electronically Signed   By: Claudie Revering M.D.   On: 03/14/2015 19:32   Ct Chest Wo Contrast  03/14/2015   CLINICAL DATA:  Multiple episodes of syncope today. Dizziness when  changing from sitting to standing. Right-sided chest pain and shortness of breath. Falls.  EXAM: CT CHEST WITHOUT CONTRAST  TECHNIQUE: Multidetector CT imaging of the chest was performed following the standard protocol without IV contrast.  COMPARISON:  None.  FINDINGS: Normal heart size. Calcification in the Coronary arteries, mitral valve  annulus, and aortic valve as well as in the aorta. Small to moderate pericardial effusion. Normal caliber thoracic aorta. Scattered mediastinal lymph nodes are not pathologically enlarged. Esophagus is decompressed.  1.8 cm diameter nodule in the right lower lung. This is indeterminate and primary or metastatic disease needs to be excluded. No other focal lung lesions. No focal consolidation or airspace disease. No pneumothorax. No pleural effusions.  Left breast reconstruction. Prominent adrenal gland nodules bilaterally, right measuring 4.8 x 1.9 cm and left measuring 2.4 x 4.7 cm. Spleen is enlarged but appears homogeneous. Multiple low-attenuation lesions in the liver are likely represent cysts but given the presence of other possible metastatic lesions, further evaluation with contrast-enhanced CT or MRI is suggested. Degenerative changes in the spine. No destructive bone lesions appreciated.  IMPRESSION: 1.8 cm diameter right lower lung nodule and bilateral adrenal gland nodules suspicious for metastatic disease. Multiple low-attenuation lesions in the liver are probably cysts but further characterization is suggested with either contrast-enhanced CT or MRI. Small to moderate pericardial effusion. Mild splenic enlargement.   Electronically Signed   By: Lucienne Capers M.D.   On: 03/14/2015 22:07   Ct Angio Chest Pe W/cm &/or Wo Cm  03/15/2015   CLINICAL DATA:  History of breast carcinoma with recent syncopal episode and back pain  EXAM: CT ANGIOGRAPHY CHEST WITH CONTRAST  TECHNIQUE: Multidetector CT imaging of the chest was performed using the standard protocol during  bolus administration of intravenous contrast. Multiplanar CT image reconstructions and MIPs were obtained to evaluate the vascular anatomy.  CONTRAST:  129m OMNIPAQUE IOHEXOL 350 MG/ML SOLN  COMPARISON:  None.  FINDINGS: The lungs are well aerated bilaterally with small left-sided pleural effusion and left basilar atelectasis. Single calcified granuloma is noted in the left lung. Scattered pulmonary nodules are identified throughout the lungs bilaterally. The largest of these lies in the right lower lobe best seen on image number 66 of series 8 measuring 19 mm in greatest dimension. A 6-7 mm nodule is noted in the left lower lobe best seen on image number 54 of series 8 as well as a tiny 2-3 mm nodule in the left apex best seen on image number 13 of series 8. Given the patient's clinical history these are consistent with metastatic foci.  The thoracic inlet is within normal limits. The thoracic aorta shows mild calcifications without aneurysmal dilatation or dissection. A pericardial effusion is noted. This measures approximately 2.3 cm in greatest thickness posteriorly. Mild coronary calcifications are noted. The pulmonary artery is well visualized and demonstrates a normal branching pattern. No filling defects to suggest pulmonary emboli are identified. No hilar or mediastinal adenopathy is noted. Some thickening of the midesophagus is noted just above the level of the carina best seen on image number 97 of series 7.  Changes consistent with breast implant are noted on the left. This is consistent with the patient's given clinical history of carcinoma of the breast. Postsurgical changes in the axilla are seen. No axillary adenopathy is identified. The upper abdomen again demonstrates hypodensities within the liver similar to that seen on prior CT. Adrenal lesions are again noted bilaterally.  Review of the MIP images confirms the above findings.  IMPRESSION: Changes consistent with metastatic disease within the  adrenal glands bilaterally as well as the lungs bilaterally.  Multiple hypodensities are again seen within liver which may represent metastatic disease. Further evaluation is recommended.  No evidence of pulmonary embolism.  Pericardial effusion.  Thickening of the midesophagus of uncertain  significance.   Electronically Signed   By: Inez Catalina M.D.   On: 03/15/2015 10:05   Mr Jeri Cos EX Contrast  03/31/2015   CLINICAL DATA:  New onset of generalized weakness and difficulty speaking. Personal history breast cancer.  EXAM: MRI HEAD WITHOUT AND WITH CONTRAST  TECHNIQUE: Multiplanar, multiecho pulse sequences of the brain and surrounding structures were obtained without and with intravenous contrast.  CONTRAST:  10m MULTIHANCE GADOBENATE DIMEGLUMINE 529 MG/ML IV SOLN  COMPARISON:  CT of the head 03/30/2015 and 09/12/2010.  FINDINGS: The diffusion-weighted images demonstrate no evidence for acute or subacute infarction. No acute hemorrhage or mass lesion is present. The ventricles are of normal size. No significant extra-axial fluid collection is present.  The basal ganglia and brainstem are within normal limits. Flow is present in the major intracranial arteries. The globes and orbits are intact. The paranasal sinuses and mastoid air cells are clear the skullbase is within normal limits. Midline structures are unremarkable.  The postcontrast images demonstrate no pathologic enhancement.  IMPRESSION: 1. No acute or focal lesion to explain the patient's acute symptoms. 2. Mild atrophy is within normal limits for age. There is no significant white matter disease.   Electronically Signed   By: CSan MorelleM.D.   On: 03/31/2015 11:35   Mr Abdomen W Wo Contrast  03/20/2015   CLINICAL DATA:  Adrenal mass.  Right lower lobe lung lesion.  EXAM: MRI ABDOMEN WITHOUT AND WITH CONTRAST  TECHNIQUE: Multiplanar multisequence MR imaging of the abdomen was performed both before and after the administration of  intravenous contrast.  CONTRAST:  129mMULTIHANCE GADOBENATE DIMEGLUMINE 529 MG/ML IV SOLN  COMPARISON:  Abdominal pelvic CT of 03/17/2015. Chest CT of 03/15/2015. History of breast cancer  FINDINGS: Portions of the exam, primarily the postcontrast dynamic series, are motion degraded.  Lower chest: Left larger than right small bilateral pleural effusions. Left breast implant. Small to moderate pericardial effusion. Right lower lobe pulmonary nodule of 1.8 cm on image 5 of series 5. Left base atelectasis.  Hepatobiliary: Multiple T2 hyperintense well-circumscribed liver lesions are consistent with cysts. On post-contrast arterial phase imaging, there are numerous foci of hyper enhancement. Example series 13001. None of these areas are identified on later post-contrast images, and are favored to represent perfusion anomalies. Normal gallbladder, without biliary ductal dilatation.  Pancreas: Normal, without mass or ductal dilatation.  Spleen: Splenomegaly, greater than 17 cm craniocaudal.  Adrenals/Urinary Tract: Left larger than right adrenal masses. 3.2 x 2.3 cm on the left. 3.1 x 1.4 cm on the right. No signal dropout on out of phase imaging. Central necrosis as evidenced by hypo enhancement.  Bilateral renal cysts. medial interpolar 1.3 cm left renal lesion on image 39 of series 5 is likely hemorrhagic cyst; no post-contrast enhancement. No hydronephrosis.  Stomach/Bowel: Normal stomach and abdominal bowel loops.  Vascular/Lymphatic: Advanced aortic atherosclerosis. Retroperitoneal nodes are prominent but not pathologic by size criteria. Right cardiophrenic angle node is upper normal at 6 mm on image 23 of series 13001.  Other: No ascites.  Musculoskeletal: Convex left lumbar spine curvature.  IMPRESSION: 1. Bilateral adrenal masses with necrosis, most consistent with metastatic disease. 2. Right lung base nodule, likely representing pulmonary metastasis. 3. Bilateral pleural and pericardial effusions. 4. No  definite evidence of hepatic metastasis. Multiple hepatic cysts. Arterial foci of hyper enhancement are likely perfusion anomalies. Given the history of breast cancer, recommend attention on follow-up to exclude less likely tiny hypervascular metastasis. 5. Splenomegaly.   Electronically  Signed   By: Abigail Miyamoto M.D.   On: 03/20/2015 08:30   Ct Abdomen Pelvis W Contrast  03/17/2015   CLINICAL DATA:  Pulmonary metastases, adrenal metastases, history of LEFT breast cancer 1981 post mastectomy and lymph node dissection  EXAM: CT ABDOMEN AND PELVIS WITH CONTRAST  TECHNIQUE: Multidetector CT imaging of the abdomen and pelvis was performed using the standard protocol following bolus administration of intravenous contrast. Sagittal and coronal MPR images reconstructed from axial data set.  CONTRAST:  173m OMNIPAQUE IOHEXOL 300 MG/ML SOLN IV. Dilute oral contrast.  COMPARISON:  None; correlation CT chest 03/15/2015  FINDINGS: LEFT breast prosthesis.  Small BILATERAL pleural effusions and bibasilar atelectasis.  Small to moderate pericardial effusion.  RIGHT lower lobe mass 19 x 16 mm image 9.  Multiple low-attenuation foci within liver likely cysts, largest lateral segment LEFT lobe 2.3 x 1.8 cm.  Splenic enlargement, spleen measuring 17.9 x 13.0 x 6.7 cm.  BILATERAL adrenal masses, 4.3 x 2.8 cm LEFT and 4.3 x 2.1 cm RIGHT.  Tiny BILATERAL renal cysts.  Remainder of liver, pancreas, and adrenal glands normal.  Normal appendix.  Questionable rectal wall thickening versus artifact near anus.  Mild wall thickening at gastric cardia, unchanged from CT chest, question gastric neoplasm.  Remainder of stomach, colon and small bowel loops normal.  Unremarkable bladder, ureters, uterus and adnexa.  Free intraperitoneal fluid in pelvis.  Some additional mass, adenopathy, free air or hernia.  Scattered atherosclerotic calcifications.  Normal size retroperitoneal nodes.  Bones demineralized.  Schmorl's node superior endplate T10.   Bones demineralized.  IMPRESSION: RIGHT lower lobe mass 19 x 16 mm question primary versus metastatic focus.  BILATERAL adrenal masses concerning for metastases.  Significant splenic enlargement without focal lesion.  Gastric wall thickening at cardia, tumor not excluded; endoscopic evaluation recommended to exclude gastric neoplasm.  Inferior rectal wall thickening versus artifact, recommend correlation with digital rectal exam and proctoscopy to exclude mass.  Ascites.   Electronically Signed   By: MLavonia DanaM.D.   On: 03/17/2015 11:00   Ct Biopsy  03/19/2015   INDICATION: Pancytopenia. Please perform CT guided bone marrow biopsy for tissue diagnostic purposes  EXAM: CT GUIDED BONE MARROW BIOPSY AND ASPIRATION.  MEDICATIONS: Fentanyl 50 mcg IV; Versed 1 mg IV  ANESTHESIA/SEDATION: Sedation Time  10 minutes  CONTRAST:  None  COMPLICATIONS: None immediate.  PROCEDURE: Informed consent was obtained from the patient following an explanation of the procedure, risks, benefits and alternatives. The patient understands, agrees and consents for the procedure. All questions were addressed. A time out was performed prior to the initiation of the procedure. The patient was positioned prone and non-contrast localization CT was performed of the pelvis to demonstrate the iliac marrow spaces. The operative site was prepped and draped in the usual sterile fashion.  Under sterile conditions and local anesthesia, a 22 gauge spinal needle was utilized for procedural planning. Next, an 11 gauge coaxial bone biopsy needle was advanced into the left iliac marrow space. Needle position was confirmed with CT imaging. Initially, bone marrow aspiration was performed. Next, a bone marrow biopsy was obtained with the 11 gauge outer bone marrow device. Samples were prepared with the cytotechnologist and deemed adequate. The needle was removed intact. Hemostasis was obtained with compression and a dressing was placed. The patient tolerated  the procedure well without immediate post procedural complication.  IMPRESSION: Successful CT guided left iliac bone marrow aspiration and core biopsies.   Electronically Signed   By:  Sandi Mariscal M.D.   On: 03/19/2015 15:05   Dg Chest Port 1 View  03/14/2015   CLINICAL DATA:  Status post fall.  History of breast cancer.  EXAM: PORTABLE CHEST - 1 VIEW  COMPARISON:  June 23, 2012  FINDINGS: The heart size and mediastinal contours are stable. There is no focal infiltrate, pulmonary edema, or pleural effusion. Left axillary surgical clips are identified unchanged. The visualized skeletal structures are stable.  IMPRESSION: No active cardiopulmonary disease.   Electronically Signed   By: Abelardo Diesel M.D.   On: 03/14/2015 18:45    ECG  NSR. Low voltage QRS complexes. No ischemia.   Echocardiogram  Study Conclusions  - Left ventricle: The cavity size was normal. Wall thickness was increased in a pattern of moderate LVH. Systolic function was normal. The estimated ejection fraction was in the range of 55% to 60%. Wall motion was normal; there were no regional wall motion abnormalities. Doppler parameters are consistent with abnormal left ventricular relaxation (grade 1 diastolic dysfunction). The E/e&' ratio is >15, suggesting elevated LV filling pressure. - Aortic valve: Sclerosis without stenosis. There was no regurgitation. - Mitral valve: Calcified annulus. There was trivial regurgitation without significant respiratory variation. - Left atrium: The atrium was normal in size. - Right ventricle: The cavity size was normal. Wall thickness was normal. Systolic function is reduced. Lateral annulus peak S velocity: 9.9 cm/s. - Tricuspid valve: There was trivial regurgitation. No significant respiratory inflow variation. - Pericardium, extracardiac: Moderate-sized circumferential pericardial effusion - measuring 1.75 cm inferiorly. Features were not consistent  with tamponade physiology.  Impressions:  - Compared to the prior echo in 02/2015, there has been no change in the pericardial effusion. LVEF is lower, but still normal. There are no features of tamponade physiology.    ASSESSMENT AND PLAN  Principal Problem:   Difficulty speaking Active Problems:   MGUS (monoclonal gammopathy of unknown significance)   Adrenal nodule   Chronic diastolic heart failure   Pulmonary nodules/lesions, multiple   DM type 2 (diabetes mellitus, type 2)   Pancytopenia   Generalized weakness   HLD (hyperlipidemia)   Depression   Slurred speech   Protein calorie malnutrition   Diarrhea    Signed, SIMMONS, BRITTAINY, PA-C 04/03/2015, 8:02 AM

## 2015-04-03 NOTE — Clinical Social Work Placement (Signed)
   CLINICAL SOCIAL WORK PLACEMENT  NOTE  Date:  04/03/2015  Patient Details  Name: [name] [name] MRN: [id number] Date of Birth: Sep 12, 1934  Clinical Social Work is seeking post-discharge placement for this patient at the [place] level of care (*CSW will initial, date and re-position this form in  chart as items are completed):  Yes   Patient/family provided with [place] Work Department's list of facilities offering this level of care within the geographic area requested by the patient (or if unable, by the patient's family).  Yes   Patient/family informed of their freedom to choose among providers that offer the needed level of care, that participate in Medicare, Medicaid or managed care program needed by the patient, have an available bed and are willing to accept the patient.  Yes   Patient/family informed of [facility]'s ownership interest in [facility] and [facility], as well as of the fact that they are under no obligation to receive care at these facilities.  PASRR submitted to EDS on       PASRR number received on       Existing PASRR number confirmed on 04/03/15     FL2 transmitted to all facilities in geographic area requested by pt/family on 04/03/15     FL2 transmitted to all facilities within larger geographic area on       Patient informed that his/her managed care company has contracts with or will negotiate with certain facilities, including the following:            Patient/family informed of bed offers received.  Patient chooses bed at       Physician recommends and patient chooses bed at      Patient to be transferred to   on  .  Patient to be transferred to facility by       Patient family notified on   of transfer.  Name of family member notified:        PHYSICIAN Please sign FL2     Additional Comment:    _______________________________________________ Ladell Pier, LCSW 04/03/2015, 12:17 PM

## 2015-04-03 NOTE — Clinical Social Work Note (Signed)
Clinical Social Work Assessment  Patient Details  Name: [name] [name] MRN: [id number] Date of Birth: Jun 30, 1935  Date of referral:  04/03/15               Reason for consult:  Discharge Planning                Permission sought to share information with:  Family Supports Permission granted to share information::  Yes, Verbal Permission Granted  Name::     Larna Daughters  Agency::     Relationship::     Contact Information:  sister at bedside  Housing/Transportation Living arrangements for the past 2 months:  Single Family Home Source of Information:  Patient, Other (Comment Required) (pt sister at bedside) Patient Interpreter Needed:  None Criminal Activity/Legal Involvement Pertinent to Current Situation/Hospitalization:  No - Comment as needed Significant Relationships:  Adult Children, Siblings Lives with:  Self Do you feel safe going back to the place where you live?  No Need for family participation in patient care:  Yes (Comment) (pt sister at bedside and pt daughter will be at hospital later today)  Care giving concerns:  Pt admitted from home alone. PT recommending SNF.    Social Worker assessment / plan: CSW received referral for New SNF.  CSW met with pt and pt sister at bedside. CSW introduced self and explained role. Pt discussed that she lived at home alone. CSW discussed with pt and pt sister that recommendation for rehab at [facility]. Pt sister discussed that OT was just present in room and discussed recommendation for SNF. CSW discussed process of SNF search and pt agreeable to [facility] search. CSW clarified pt questions about rehab at [facility] and provided [facility] list.  CSW completed FL2 and initiated SNF search to [facility]. CSW to follow up with pt and pt family re: SNF bed offers.  CSW to continue to follow to provide support and assist with pt discharge planning needs.  Employment status:  Retired Forensic scientist:  Medicare PT Recommendations:   [place] / Referral to community resources:  [place]  Patient/Family's Response to care:  Pt alert and oriented x 4. Pt has intermittent confusion. Pt has family support from pt sister and pt daughter. Pt feel rehab at SNF will be best plan before returning home.   Patient/Family's Understanding of and Emotional Response to Diagnosis, Current Treatment, and Prognosis: Pt and pt family expressed understanding regarding pt diagnosis and treatment plan. Pt for biopsy today.   Emotional Assessment Appearance:  Appears stated age Attitude/Demeanor/Rapport:  Other (appropriate) Affect (typically observed):  Adaptable Orientation:  Oriented to Self, Oriented to Place, Oriented to  Time, Oriented to Situation (pt forgetful at times) Alcohol / Substance use:  Not Applicable Psych involvement (Current and /or in the community):  No (Comment)  Discharge Needs  Concerns to be addressed:  Discharge Planning Concerns Readmission within the last 30 days:  No Current discharge risk:  Physical Impairment Barriers to Discharge:  Continued Medical Work up   Alison Murray A, LCSW 04/03/2015, 12:09 PM [phone]

## 2015-04-03 NOTE — Progress Notes (Signed)
  Subjective: Awaiting IR biopsy of her adrenal nodules, apparently that is being reconsidered.    Objective: Vital signs in last 24 hours: Temp:  [97.5 F (36.4 C)-98.8 F (37.1 C)] 97.8 F (36.6 C) (09/14 0641) Pulse Rate:  [70-77] 75 (09/14 0641) Resp:  [16] 16 (09/14 0641) BP: (125-136)/(50-71) 125/50 mmHg (09/14 0641) SpO2:  [98 %-100 %] 98 % (09/14 0641) Weight:  [64.411 kg (142 lb)] 64.411 kg (142 lb) (09/14 0641) Last BM Date: 04/02/15 480 PO 2 stools Afebrile, VSS Labs no better. Intake/Output from previous day: 09/13 0701 - 09/14 0700 In: 480 [P.O.:480] Out: -  Intake/Output this shift:    General appearance: alert, cooperative and no distress  Lab Results:   Recent Labs  04/02/15 0458 04/03/15 0559  WBC 1.3* 1.2*  HGB 9.5* 10.2*  HCT 28.3* 29.3*  PLT 101* 122*    BMET  Recent Labs  04/01/15 0914 04/02/15 0458  NA 134* 132*  K 4.3 3.7  CL 105 100*  CO2 18* 25  GLUCOSE 78 118*  BUN 10 7  CREATININE 0.92 0.86  CALCIUM 9.0 8.5*   PT/INR  Recent Labs  04/03/15 0559  LABPROT 16.0*  INR 1.26     Recent Labs Lab 03/30/15 2025 03/31/15 0346  AST 31 26  ALT 11* 9*  ALKPHOS 116 99  BILITOT 1.5* 1.1  PROT 6.5 5.6*  ALBUMIN 3.1* 2.7*     Lipase     Component Value Date/Time   LIPASE 54* 03/14/2015 1905     Studies/Results: No results found.  Medications: . antiseptic oral rinse  7 mL Mouth Rinse q12n4p  . aspirin EC  81 mg Oral Weekly  . chlorhexidine  15 mL Mouth Rinse BID  . escitalopram  5 mg Oral q morning - 10a  . feeding supplement (ENSURE ENLIVE)  237 mL Oral BID BM  . heparin  5,000 Units Subcutaneous 3 times per day  . hydrocortisone sod succinate (SOLU-CORTEF) inj  100 mg Intravenous Q12H  . multivitamin with minerals  1 tablet Oral Daily  . omega-3 acid ethyl esters  2 g Oral Daily  . [START ON 04/04/2015] pneumococcal 23 valent vaccine  0.5 mL Intramuscular Tomorrow-1000  . propranolol ER  60 mg Oral q morning  - 10a  . raloxifene  60 mg Oral q morning - 10a  . [START ON 04/04/2015] regadenoson  0.4 mg Intravenous Once  . rosuvastatin  10 mg Oral q morning - 10a  . sodium chloride  3 mL Intravenous Q12H    Assessment/Plan Adrenal Nodules Mental status changes (monoclonal gammopathy of unknown significance) 03/16/2013      Pulmonary nodules/lesions Chronic diastolic heart failure Pancytopenia Essential hypertension Hyperlipidemia Diet-controlled DM2 Antibiotics:  None DVT: no heparin currently/pancytopenia/SCD's added   Plan:  Will discuss with Dr. Hassell Done.   LOS: 4 days    [name] [name] 04/03/2015

## 2015-04-03 NOTE — Progress Notes (Addendum)
PROGRESS NOTE    [name] [name] ZOX:[id number] DOB: [birth year] DOA: 03/30/2015 PCP: Jerlyn Ly, MD  Oncologist: Dr. Marin Olp  HPI/Brief narrative 80 year old female with history of HTN, HLD, diet-controlled DM 2, depression, MGUS, breast cancer status post mastectomy 1981, pancytopenia, hospitalized 8/25-03/21/15 for syncope at which time workup showed possible metastatic disease with lung nodules/adrenal nodules/liver lesions, admitted again to [facility] on 03/30/15 with complaints of weakness, poor oral intake, several days difficulty speaking but no unilateral weakness or visual abnormalities. Mild diarrhea. In the ED, pancytopenic and CT head shows multiple low-attenuation areas probably due to old ischemia but acute process or metastatic lesions not excluded. MRI brain _ no acute findings. She remains confused, extremely weak and poor appetite. Oncology consulted > recommend Adrenal lesion Bx - CCS initially consulted who have asked IR for same.   Assessment/Plan:  Altered MS - CT head findings as below. - MRI brain negative for metastatic disease or acute findings. Oncology cannot rule out paraneoplastic syndrome. PET scan can be performed as outpatient. - No obvious dysarthria or facial asymmetry. Passed bedside swallow screen. - Unclear reason as to patient's presentation. Urine microscopy and chest x-ray without infectious process. Less likely to be TIA given duration of symptoms.? Related to dehydration. Monitor. - Discussed extensively with daughter [name] on 9/12: did OK for 3 days post recent DC and since then rapid decline with poor PO intake, generalized weakness, inability to walk and confusion. She doesn't think mom has made any improvement since admission. No h/o slurred speech or asymmetric weakness at any time - HIV: neg, RPR: non reactive, TSH 4.4019 and B 12: 1453 - BCx's: neg to date.  Pancytopenia - Unclear etiology.  - Dr. Marin Olp, Oncology input  appreciated> requesting Adrenal Lesion Bx for definitive tissue Bx. Cardiology consulted for clearance in the event that the patient may need laparoscopic surgery,/splenectomy. Patient was found to have a pericardial effusion. No evidence   of tamponade . Cardiology plans to get [facility] tomorrow .   Adrenal nodules Originally planning to do a laparoscopic adrenal nodule biopsy, but after further chart review, due to pts severely enlarged spleen and pancytopenia with WBC 1.3, recommend IR biopsy of a single adrenal nodule. General surgery discussed with Dr. Laurence Ferrari on 9/13 at 2:52pm who thinks they can get a bx of the left adrenal. Imaging studies were reviewed by Dr. Laurence Ferrari and he feels left adrenal mass is amenable to biopsy. Interventional radiology recommends to proceed with the biopsy at this time.   Dehydration with hyponatremia - Continued poor oral intake. Continue IV normal saline hydration  MGUS - Oncology working up further  Chronic diastolic CHF - Not on diuretics at home. Compensated.  Diet-controlled DM 2-continue SSI  Hyperlipidemia - Continue statins  Diarrhea - If no further diarrhea reported, DC contact isolation and plans for C. difficile workup  Essential hypertension - Controlled with propranolol  Recent hospitalization for syncope, orthostatic hypotension, acute renal failure and abnormal troponins  NAG - bicarbonate. Better    DVT prophylaxis: Subcutaneous heparin Code Status: Full Family Communication: Discussed extensively with daughter [name] on 04/01/2015. Disposition Plan: Eventually the patient would need SNF   Consultants:  Oncology  CCS  IR  Procedures:  None  Antibiotics:  None   Subjective: Patient is confused, sister by the bedside,.  Objective: Filed Vitals:   04/02/15 1102 04/02/15 1412 04/02/15 2158 04/03/15 0641  BP: 124/60 128/63 136/71 125/50  Pulse:  70 77 75  Temp:  98.8 F (37.1 C) 97.5 F  (36.4 C) 97.8 F (36.6 C)  TempSrc:  Oral Oral Oral  Resp:  16 16 16   Height:      Weight:    64.411 kg (142 lb)  SpO2:  100% 99% 98%    Intake/Output Summary (Last 24 hours) at 04/03/15 1219 Last data filed at 04/03/15 1062  Gross per 24 hour  Intake    240 ml  Output      0 ml  Net    240 ml   Filed Weights   03/31/15 0117 04/02/15 0448 04/03/15 0641  Weight: 60.918 kg (134 lb 4.8 oz) 63.7 kg (140 lb 6.9 oz) 64.411 kg (142 lb)     Exam:  General exam: Pleasant elderly female lying comfortably supine in bed. Chronically ill looking. Dry oral mucosa. Respiratory system: Clear. No increased work of breathing. Cardiovascular system: S1 & S2 heard, RRR. No JVD, murmurs, gallops, clicks or pedal edema.  Gastrointestinal system: Abdomen is nondistended, soft and nontender. Normal bowel sounds heard. Central nervous system: Alert and oriented only to self. No focal neurological deficits. Extremities: Symmetric 5 x 5 power.   Data Reviewed: Basic Metabolic Panel:  Recent Labs Lab 03/30/15 2025 03/30/15 2035 03/31/15 0346 04/01/15 0429 04/01/15 0914 04/02/15 0458  NA 129* 130* 132* 135 134* 132*  K 3.7 3.6 3.6 4.4 4.3 3.7  CL 95* 94* 99* 104 105 100*  CO2 21*  --  21* 16* 18* 25  GLUCOSE 90 88 83 77 78 118*  BUN 16 15 15 11 10 7   CREATININE 0.93 1.10* 1.07* 0.84 0.92 0.86  CALCIUM 8.9  --  8.5* 8.7* 9.0 8.5*   Liver Function Tests:  Recent Labs Lab 03/30/15 2025 03/31/15 0346  AST 31 26  ALT 11* 9*  ALKPHOS 116 99  BILITOT 1.5* 1.1  PROT 6.5 5.6*  ALBUMIN 3.1* 2.7*   No results for input(s): LIPASE, AMYLASE in the last 168 hours. No results for input(s): AMMONIA in the last 168 hours. CBC:  Recent Labs Lab 03/30/15 2025 03/30/15 2035 03/31/15 0346 04/01/15 0914 04/02/15 0458 04/03/15 0559  WBC 2.1*  --  2.0* 1.8* 1.3* 1.2*  NEUTROABS 1.6*  --   --   --  0.9* 1.0*  HGB 10.2* 10.2* 9.7* 10.9* 9.5* 10.2*  HCT 29.1* 30.0* 29.1* 30.9* 28.3* 29.3*    MCV 92.7  --  90.7 96.3 91.9 92.4  PLT 129*  --  124* 104* 101* 122*   Cardiac Enzymes: No results for input(s): CKTOTAL, CKMB, CKMBINDEX, TROPONINI in the last 168 hours. BNP (last 3 results) No results for input(s): PROBNP in the last 8760 hours. CBG:  Recent Labs Lab 03/31/15 0720 04/01/15 0741 04/02/15 0734 04/03/15 0735  GLUCAP 81 74 106* 162*    Recent Results (from the past 240 hour(s))  Culture, blood (x 2)     Status: None (Preliminary result)   Collection Time: 03/31/15  1:30 AM  Result Value Ref Range Status   Specimen Description BLOOD RIGHT ARM  Final   Special Requests BOTTLES DRAWN AEROBIC ONLY 5CC  Final   Culture   Final    NO GROWTH 2 DAYS Performed at [facility]    Report Status PENDING  Incomplete  Culture, blood (x 2)     Status: None (Preliminary result)   Collection Time: 03/31/15  1:50 AM  Result Value Ref Range Status   Specimen Description BLOOD RIGHT HAND  Final   Special  Requests BOTTLES DRAWN AEROBIC ONLY 5CC  Final   Culture   Final    NO GROWTH 2 DAYS Performed at [facility]    Report Status PENDING  Incomplete         Studies: No results found.      Scheduled Meds: . antiseptic oral rinse  7 mL Mouth Rinse q12n4p  . aspirin EC  81 mg Oral Weekly  . chlorhexidine  15 mL Mouth Rinse BID  . escitalopram  5 mg Oral q morning - 10a  . feeding supplement (ENSURE ENLIVE)  237 mL Oral BID BM  . heparin  5,000 Units Subcutaneous 3 times per day  . hydrocortisone sod succinate (SOLU-CORTEF) inj  100 mg Intravenous Q12H  . multivitamin with minerals  1 tablet Oral Daily  . omega-3 acid ethyl esters  2 g Oral Daily  . [START ON 04/04/2015] pneumococcal 23 valent vaccine  0.5 mL Intramuscular Tomorrow-1000  . propranolol ER  60 mg Oral q morning - 10a  . raloxifene  60 mg Oral q morning - 10a  . [START ON 04/04/2015] regadenoson  0.4 mg Intravenous Once  . rosuvastatin  10 mg Oral q morning - 10a  . sodium  chloride  3 mL Intravenous Q12H   Continuous Infusions:    Principal Problem:   Difficulty speaking Active Problems:   MGUS (monoclonal gammopathy of unknown significance)   Adrenal nodule   Chronic diastolic heart failure   Pulmonary nodules/lesions, multiple   DM type 2 (diabetes mellitus, type 2)   Pancytopenia   Generalized weakness   HLD (hyperlipidemia)   Depression   Slurred speech   Protein calorie malnutrition   Diarrhea   Pericardial effusion   Coronary artery calcification seen on CAT scan    Time spent: 30 minutes.    Reyne Dumas, MD,  [facility] Pager [phone]  If 7PM-7AM, please contact night-coverage [email or web address] Password [email or web address] 04/03/2015, 12:19 PM    LOS: 4 days

## 2015-04-04 ENCOUNTER — Ambulatory Visit (HOSPITAL_COMMUNITY)
Admission: RE | Admit: 2015-04-04 | Discharge: 2015-04-04 | Disposition: A | Discharge status: Home / Self Care | Payer: Medicare Other | Attending: Cardiology | Admitting: Cardiology

## 2015-04-04 ENCOUNTER — Ambulatory Visit (HOSPITAL_COMMUNITY)
Admit: 2015-04-04 | Discharge: 2015-04-04 | Disposition: A | Discharge status: Home / Self Care | Payer: Medicare Other | Attending: Cardiology | Admitting: Cardiology

## 2015-04-04 DIAGNOSIS — I319 Disease of pericardium, unspecified: Secondary | ICD-10-CM

## 2015-04-04 DIAGNOSIS — R490 Dysphonia: Secondary | ICD-10-CM

## 2015-04-04 DIAGNOSIS — I2584 Coronary atherosclerosis due to calcified coronary lesion: Secondary | ICD-10-CM | POA: Insufficient documentation

## 2015-04-04 DIAGNOSIS — Z01818 Encounter for other preprocedural examination: Secondary | ICD-10-CM | POA: Diagnosis not present

## 2015-04-04 DIAGNOSIS — I313 Pericardial effusion (noninflammatory): Secondary | ICD-10-CM

## 2015-04-04 DIAGNOSIS — I2510 Atherosclerotic heart disease of native coronary artery without angina pectoris: Secondary | ICD-10-CM | POA: Insufficient documentation

## 2015-04-04 DIAGNOSIS — I5032 Chronic diastolic (congestive) heart failure: Secondary | ICD-10-CM

## 2015-04-04 LAB — CBC WITH DIFFERENTIAL/PLATELET
BASOS ABS: 0 10*3/uL (ref 0.0–0.1)
BASOS PCT: 0 %
EOS ABS: 0 10*3/uL (ref 0.0–0.7)
Eosinophils Relative: 0 %
HCT: 29.4 % — ABNORMAL LOW (ref 36.0–46.0)
HEMOGLOBIN: 9.7 g/dL — AB (ref 12.0–15.0)
LYMPHS ABS: 0.2 10*3/uL — AB (ref 0.7–4.0)
Lymphocytes Relative: 2 %
MCH: 30.3 pg (ref 26.0–34.0)
MCHC: 33 g/dL (ref 30.0–36.0)
MCV: 91.9 fL (ref 78.0–100.0)
Monocytes Absolute: 0.5 10*3/uL (ref 0.1–1.0)
Monocytes Relative: 6 %
NEUTROS ABS: 7.4 10*3/uL (ref 1.7–7.7)
Neutrophils Relative %: 92 %
PLATELETS: 116 10*3/uL — AB (ref 150–400)
RBC: 3.2 MIL/uL — ABNORMAL LOW (ref 3.87–5.11)
RDW: 14.5 % (ref 11.5–15.5)
WBC: 8.1 10*3/uL (ref 4.0–10.5)

## 2015-04-04 LAB — NM MYOCAR MULTI W/SPECT W/WALL MOTION / EF
CHL CUP MPHR: 142 {beats}/min
CHL CUP NUCLEAR SDS: 2
CHL CUP NUCLEAR SRS: 3
CHL CUP RESTING HR STRESS: 73 {beats}/min
CSEPEDS: 0 s
CSEPHR: 67 %
Estimated workload: 1 METS
Exercise duration (min): 0 min
LHR: 0
LV sys vol: 10 mL
LVDIAVOL: 43 mL
Peak HR: 96 {beats}/min
RPE: 0
SSS: 5
TID: 1.22

## 2015-04-04 LAB — COMPREHENSIVE METABOLIC PANEL
ALT: 11 U/L — AB (ref 14–54)
AST: 36 U/L (ref 15–41)
Albumin: 2.9 g/dL — ABNORMAL LOW (ref 3.5–5.0)
Alkaline Phosphatase: 88 U/L (ref 38–126)
Anion gap: 10 (ref 5–15)
BILIRUBIN TOTAL: 0.5 mg/dL (ref 0.3–1.2)
BUN: 19 mg/dL (ref 6–20)
CHLORIDE: 102 mmol/L (ref 101–111)
CO2: 28 mmol/L (ref 22–32)
CREATININE: 1.14 mg/dL — AB (ref 0.44–1.00)
Calcium: 9 mg/dL (ref 8.9–10.3)
GFR, EST AFRICAN AMERICAN: 52 mL/min — AB (ref >60)
GFR, EST NON AFRICAN AMERICAN: 45 mL/min — AB (ref >60)
Glucose, Bld: 148 mg/dL — ABNORMAL HIGH (ref 65–99)
Potassium: 4.3 mmol/L (ref 3.5–5.1)
Sodium: 140 mmol/L (ref 135–145)
TOTAL PROTEIN: 5.9 g/dL — AB (ref 6.5–8.1)

## 2015-04-04 LAB — GLUCOSE, CAPILLARY: GLUCOSE-CAPILLARY: 131 mg/dL — AB (ref 65–99)

## 2015-04-04 LAB — LACTATE DEHYDROGENASE: LDH: 92 U/L — AB (ref 98–192)

## 2015-04-04 LAB — VISCOSITY, SERUM: Viscosity, Serum: 1.5 rel.saline — ABNORMAL LOW (ref 1.6–1.9)

## 2015-04-04 MED ORDER — TECHNETIUM TC 99M SESTAMIBI GENERIC - CARDIOLITE
30.0000 | Freq: Once | INTRAVENOUS | Status: AC | PRN
  Administered 2015-04-04: 30 via INTRAVENOUS

## 2015-04-04 MED ORDER — REGADENOSON 0.4 MG/5ML IV SOLN
INTRAVENOUS | Status: AC
  Filled 2015-04-04: qty 5

## 2015-04-04 MED ORDER — DEXTROSE 5 % IV SOLN
2.0000 g | INTRAVENOUS | Status: AC
  Administered 2015-04-05: 2 g via INTRAVENOUS
  Filled 2015-04-04: qty 2

## 2015-04-04 MED ORDER — TECHNETIUM TC 99M SESTAMIBI GENERIC - CARDIOLITE
10.0000 | Freq: Once | INTRAVENOUS | Status: AC | PRN
  Administered 2015-04-04: 10 via INTRAVENOUS

## 2015-04-04 MED ORDER — REGADENOSON 0.4 MG/5ML IV SOLN
0.4000 mg | Freq: Once | INTRAVENOUS | Status: DC
  Filled 2015-04-04: qty 5

## 2015-04-04 NOTE — Care Management Important Message (Signed)
Important Message  Patient Details  Name: [name] [name] MRN: [id number] Date of Birth: 21-Nov-1934   Medicare Important Message Given:  Yes-third notification given    Camillo Flaming 04/04/2015, 1:10 [place] Message  Patient Details  Name: [name] [name] MRN: [id number] Date of Birth: Aug 19, 1934   Medicare Important Message Given:  Yes-third notification given    Camillo Flaming 04/04/2015, 1:10 PM

## 2015-04-04 NOTE — Progress Notes (Signed)
Patient Profile: 80 y/o female with no prior cardiac history, recently diagnosed with bilateral adrenal lesions with possible metastasis and known moderate sized pericardial effusion, being evaluated for surgical clearance.    Subjective: No complaints. She denies chest pain, dyspnea and no further syncope/ near syncope.   Objective: Vital signs in last 24 hours: Temp:  [97.7 F (36.5 C)-98.4 F (36.9 C)] 97.7 F (36.5 C) (09/15 0618) Pulse Rate:  [76-83] 83 (09/15 0618) Resp:  [16-23] 18 (09/15 0618) BP: (96-140)/(43-63) 120/53 mmHg (09/15 0618) SpO2:  [30 %-100 %] 100 % (09/15 0618) Weight:  [134 lb 3.2 oz (60.873 kg)] 134 lb 3.2 oz (60.873 kg) (09/15 0500) Last BM Date: 04/03/15  Intake/Output from previous day:   Intake/Output this shift:    Medications Current Facility-Administered Medications  Medication Dose Route Frequency Provider Last Rate Last Dose  . acetaminophen (TYLENOL) tablet 650 mg  650 mg Oral Q6H PRN Ivor Costa, MD       Or  . acetaminophen (TYLENOL) suppository 650 mg  650 mg Rectal Q6H PRN Ivor Costa, MD      . antiseptic oral rinse (CPC / CETYLPYRIDINIUM CHLORIDE 0.05%) solution 7 mL  7 mL Mouth Rinse q12n4p Ivor Costa, MD   7 mL at 04/03/15 1600  . aspirin EC tablet 81 mg  81 mg Oral Weekly Ivor Costa, MD   81 mg at 03/31/15 1230  . chlorhexidine (PERIDEX) 0.12 % solution 15 mL  15 mL Mouth Rinse BID Ivor Costa, MD   15 mL at 04/03/15 2214  . escitalopram (LEXAPRO) tablet 5 mg  5 mg Oral q morning - 10a Ivor Costa, MD   5 mg at 04/03/15 0848  . feeding supplement (ENSURE ENLIVE) (ENSURE ENLIVE) liquid 237 mL  237 mL Oral BID BM Ivor Costa, MD   237 mL at 04/03/15 1429  . heparin injection 5,000 Units  5,000 Units Subcutaneous 3 times per day Ivor Costa, MD   5,000 Units at 04/04/15 0500  . hydrocortisone sodium succinate (SOLU-CORTEF) 100 MG injection 100 mg  100 mg Intravenous Q12H Volanda Napoleon, MD   100 mg at 04/04/15 0756  . hydrOXYzine (VISTARIL)  injection 25 mg  25 mg Intramuscular Q6H PRN Ivor Costa, MD      . multivitamin with minerals tablet 1 tablet  1 tablet Oral Daily Ivor Costa, MD   1 tablet at 04/03/15 0846  . omega-3 acid ethyl esters (LOVAZA) capsule 2 g  2 g Oral Daily Ivor Costa, MD   2 g at 04/02/15 0849  . pneumococcal 23 valent vaccine (PNU-IMMUNE) injection 0.5 mL  0.5 mL Intramuscular Tomorrow-1000 Volanda Napoleon, MD      . promethazine (PHENERGAN) tablet 12.5 mg  12.5 mg Oral Q8H PRN Modena Jansky, MD   12.5 mg at 04/01/15 0937  . propranolol ER (INDERAL LA) 24 hr capsule 60 mg  60 mg Oral q morning - 10a Ivor Costa, MD   60 mg at 04/03/15 0957  . raloxifene (EVISTA) tablet 60 mg  60 mg Oral q morning - 10a Ivor Costa, MD   60 mg at 04/03/15 0957  . regadenoson (LEXISCAN) injection SOLN 0.4 mg  0.4 mg Intravenous Once Reyne Dumas, MD      . rosuvastatin (CRESTOR) tablet 10 mg  10 mg Oral q morning - 10a Ivor Costa, MD   10 mg at 04/03/15 0846  . sodium chloride 0.9 % injection 3 mL  3 mL Intravenous Q12H Soledad Gerlach  Blaine Hamper, MD   3 mL at 04/03/15 2022    PE: General appearance: alert, cooperative and no distress Neck: no carotid bruit and no JVD Lungs: clear to auscultation bilaterally Heart: regular rate and rhythm, S1, S2 normal, soft 1/6 SM along RUSB Extremities: no LEE Pulses: 2+ and symmetric Skin: warm and dry Neurologic: Grossly normal  Lab Results:   Recent Labs  04/02/15 0458 04/03/15 0559 04/04/15 0500  WBC 1.3* 1.2* 8.1  HGB 9.5* 10.2* 9.7*  HCT 28.3* 29.3* 29.4*  PLT 101* 122* 116*   BMET  Recent Labs  04/01/15 0914 04/02/15 0458 04/04/15 0500  NA 134* 132* 140  K 4.3 3.7 4.3  CL 105 100* 102  CO2 18* 25 28  GLUCOSE 78 118* 148*  BUN 10 7 19   CREATININE 0.92 0.86 1.14*  CALCIUM 9.0 8.5* 9.0   PT/INR  Recent Labs  04/03/15 0559  LABPROT 16.0*  INR 1.26    Studies/Results: NST- pending.    Assessment/Plan  Principal Problem:   Difficulty speaking Active Problems:   MGUS  (monoclonal gammopathy of unknown significance)   Adrenal nodule   Chronic diastolic heart failure   Pulmonary nodules/lesions, multiple   DM type 2 (diabetes mellitus, type 2)   Pancytopenia   Generalized weakness   HLD (hyperlipidemia)   Depression   Slurred speech   Protein calorie malnutrition   Diarrhea   Pericardial effusion   Coronary artery calcification seen on CAT scan   1. Surgical Clearance: Her risk factors for CAD are advanced age, remote tobacco use, HTN, pre DM. Her EKG shows nonspecific T wave abnormality in septal leads with diffuse low voltage corresponding to her moderate pericardial effusion on echo that is unchanged. 2D echo with normal LVF and no RWMAs. She did have elevated troponins on last admission although flat and evidence of coronary artery calcifications on chest CT.We will evaluate with a NST to rule out ischemia, prior to undergoing surgery. Plan [facility] at [facility].   2. Pericardial Effusion: recent echo showed moderate-sized circumferential pericardial effusion - measuring 1.75 cm inferiorly. Features were not consistent with tamponade physiology. She remains hemodynamically stable. No dyspnea or further syncope/ near syncope.   3. Adrenal Masses: w/u and treatment by oncology and surgery.  4. HTN: BP is controlled.   5. DM: management per primary.    LOS: 5 days    Brittainy M. Rosita Fire, PA-C 04/04/2015 8:42 AM

## 2015-04-04 NOTE — Progress Notes (Signed)
PT Cancellation Note  Patient Details Name: [name] [name] MRN: [id number] DOB: 1934/09/16   Cancelled Treatment:    Reason Eval/Treat Not Completed: Patient at procedure or test/unavailable [facility] for stress test)   Alenah Sarria,KATHrine E 04/04/2015, 11:57 AM Carmelia Bake, PT, DPT 04/04/2015 Pager: [phone]

## 2015-04-04 NOTE — Progress Notes (Signed)
Mrs. Vanlue underwent her biopsy yesterday. It looks like Dr. Laurence Ferrari  Did a great job. Hopefully, we will have some results today.  The new beginning that she got just a worked very well. Her White cell count went from 1.2 up to 8. She is hungry. She wants to eat a little bit more. I think her diet should be liberalized.  She is getting physical therapy. I think this will be very helpful for her.  I think that the Solu-Cortef that she is taking also has been helpful.  She might be able to go onto some oral hydrocortisone right now.  Her labs looked pretty stable. Her BUN is 19 and creatinine 1.14. Her LFTs are normal. LDH is 92. Her hemoglobin  Is 9.7 and platelet count 116,000.  Her physical exam shows stable vital signs. Blood pressure 120/53. Pulse is 83. Her spleen might be palpable with deep inspiration. There is no hepatomegaly. She has no fluid wave. There is no adenopathy.  From what her sister says, she is going to have a stress test done today.  I think the next step is going to be the biopsy results. I think if this is nondiagnostic, then I clearly would recommend a laparotomy or laparoscopy approach and remove her spleen and get a adrenal biopsy.  I still have to believe that there is something in the spleen that might be causing the pancytopenia and the M spike.  I'm just glad she is feeling better. She is trying to look like her old self. We now need to try to get her to get some weight on and get stronger.   I appreciate the great care that she is getting up on 4 W.  [name].  Ephesians 5:15

## 2015-04-04 NOTE — Progress Notes (Signed)
Pt had normal nuc study.  We have signed off, please call for questions.  I notified pt by phone.

## 2015-04-04 NOTE — Progress Notes (Signed)
Subjective: She is fine awaiting stress test.  She had the biopsy yesterday. Awaiting results.  Objective: Vital signs in last 24 hours: Temp:  [97.7 F (36.5 C)-98.4 F (36.9 C)] 97.7 F (36.5 C) (09/15 0618) Pulse Rate:  [76-83] 83 (09/15 0618) Resp:  [16-23] 18 (09/15 0618) BP: (96-140)/(43-63) 120/53 mmHg (09/15 0618) SpO2:  [30 %-100 %] 100 % (09/15 0618) Weight:  [60.873 kg (134 lb 3.2 oz)] 60.873 kg (134 lb 3.2 oz) (09/15 0500) Last BM Date: 04/03/15 Nothing PO recorded 1 stool recorded Afebrile, VSS creatinine is up some Big improvement in the WBC from admit and yesterday CT biopsy completed yesterday Intake/Output from previous day:   Intake/Output this shift:    General appearance: alert, cooperative and no distress GI: soft, non-tender; bowel sounds normal; no masses,  no organomegaly  Lab Results:   Recent Labs  04/03/15 0559 04/04/15 0500  WBC 1.2* 8.1  HGB 10.2* 9.7*  HCT 29.3* 29.4*  PLT 122* 116*    BMET  Recent Labs  04/02/15 0458 04/04/15 0500  NA 132* 140  K 3.7 4.3  CL 100* 102  CO2 25 28  GLUCOSE 118* 148*  BUN 7 19  CREATININE 0.86 1.14*  CALCIUM 8.5* 9.0   PT/INR  Recent Labs  04/03/15 0559  LABPROT 16.0*  INR 1.26     Recent Labs Lab 03/30/15 2025 03/31/15 0346 04/04/15 0500  AST 31 26 36  ALT 11* 9* 11*  ALKPHOS 116 99 88  BILITOT 1.5* 1.1 0.5  PROT 6.5 5.6* 5.9*  ALBUMIN 3.1* 2.7* 2.9*     Lipase     Component Value Date/Time   LIPASE 54* 03/14/2015 1905     Studies/Results: Ct Biopsy  04/03/2015   CLINICAL DATA:  80 year old female with metastatic disease of uncertain etiology. She presents for CT-guided biopsy of left adrenal mass  EXAM: CT BIOPSY  Date: 04/03/2015  PROCEDURE: 1. CT-guided biopsy left adrenal mass Interventional Radiologist:  Criselda Peaches, MD  ANESTHESIA/SEDATION: Moderate (conscious) sedation was used. 1 mg Versed, 25 mcg Fentanyl were administered intravenously. The  patient's vital signs were monitored continuously by radiology nursing throughout the procedure.  Sedation Time: 9 minutes  MEDICATIONS: None additional  TECHNIQUE: Informed consent was obtained from the patient following explanation of the procedure, risks, benefits and alternatives. The patient understands, agrees and consents for the procedure. All questions were addressed. A time out was performed.  A planning axial CT scan was performed. The large mass in the left adrenal gland was successfully identified. A suitable skin entry site was selected and marked. The region was sterilely prepped and draped in standard fashion using Betadine skin prep. Local anesthesia was attained by infiltration with 1% lidocaine. A small dermatotomy was made. Under intermittent CT fluoroscopic guidance, a 15 cm 17 gauge introducer needle was advanced through the retroperitoneal space and into the margin of the left adrenal mass. Multiple 18 gauge core biopsies were then coaxially obtained using the bio Pince automated biopsy device. Biopsy specimens were placed in saline.  The introducer needle was removed. Post biopsy axial CT imaging demonstrates no evidence of complication. Specifically, there is no evidence of pneumothorax or periadrenal hemorrhage. The patient tolerated the procedure well.  COMPLICATIONS: None  Estimated blood loss:  0  IMPRESSION: Technically successful CT-guided core biopsy of left adrenal mass.  Signed,  Criselda Peaches, MD  [facility]  [facility]   Electronically Signed   By: Jacqulynn Cadet  M.D.   On: 04/03/2015 16:47    Medications: . antiseptic oral rinse  7 mL Mouth Rinse q12n4p  . aspirin EC  81 mg Oral Weekly  . chlorhexidine  15 mL Mouth Rinse BID  . escitalopram  5 mg Oral q morning - 10a  . feeding supplement (ENSURE ENLIVE)  237 mL Oral BID BM  . heparin  5,000 Units Subcutaneous 3 times per day  . hydrocortisone sod succinate  (SOLU-CORTEF) inj  100 mg Intravenous Q12H  . multivitamin with minerals  1 tablet Oral Daily  . omega-3 acid ethyl esters  2 g Oral Daily  . pneumococcal 23 valent vaccine  0.5 mL Intramuscular Tomorrow-1000  . propranolol ER  60 mg Oral q morning - 10a  . raloxifene  60 mg Oral q morning - 10a  . regadenoson  0.4 mg Intravenous Once  . rosuvastatin  10 mg Oral q morning - 10a  . sodium chloride  3 mL Intravenous Q12H    Assessment/Plan Adrenal Nodules Mental status changes Pulmonary nodules/lesions Chronic diastolic heart failure Pancytopenia Essential hypertension Hyperlipidemia Diet-controlled DM2 Antibiotics: None DVT: no heparin currently/pancytopenia/SCD's added        Plan:  Will await biopsy results and recommendations by Dr. Marin Olp.       LOS: 5 days    [name] [name] 04/04/2015

## 2015-04-04 NOTE — Clinical Social Work Placement (Signed)
   CLINICAL SOCIAL WORK PLACEMENT  NOTE  Date:  04/04/2015  Patient Details  Name: [name] [name] MRN: [id number] Date of Birth: 04/05/1935  Clinical Social Work is seeking post-discharge placement for this patient at the [place] level of care (*CSW will initial, date and re-position this form in  chart as items are completed):  Yes   Patient/family provided with [place] Work Department's list of facilities offering this level of care within the geographic area requested by the patient (or if unable, by the patient's family).  Yes   Patient/family informed of their freedom to choose among providers that offer the needed level of care, that participate in Medicare, Medicaid or managed care program needed by the patient, have an available bed and are willing to accept the patient.  Yes   Patient/family informed of [facility]'s ownership interest in [facility] and [facility], as well as of the fact that they are under no obligation to receive care at these facilities.  PASRR submitted to EDS on       PASRR number received on       Existing PASRR number confirmed on 04/03/15     FL2 transmitted to all facilities in geographic area requested by pt/family on 04/03/15     FL2 transmitted to all facilities within larger geographic area on       Patient informed that his/her managed care company has contracts with or will negotiate with certain facilities, including the following:        Yes   Patient/family informed of bed offers received.  Patient chooses bed at       Physician recommends and patient chooses bed at      Patient to be transferred to   on  .  Patient to be transferred to facility by       Patient family notified on   of transfer.  Name of family member notified:        PHYSICIAN Please sign FL2     Additional Comment:    _______________________________________________ Ladell Pier, LCSW 04/04/2015, 1:29 PM

## 2015-04-04 NOTE — Progress Notes (Signed)
CSW continuing to follow.   CSW visited pt room. Pt out of room in procedure. Pt sister at bedside and had pt son on the telephone. CSW discussed SNF bed offers with pt family members. Pt family interested in [facility] which is currently considering depending on biopsy results.   Pt son reports that pt may have spleen removed tomorrow and not yet medically ready for discharge.  CSW to continue to follow to provide support and assist with pt disposition needs.  Alison Murray, MSW, [place] [facility] [phone]

## 2015-04-04 NOTE — Progress Notes (Signed)
lexiscan myoview completed without complications.  nuc results to follow. 

## 2015-04-04 NOTE — Progress Notes (Addendum)
PROGRESS NOTE    [name] [name] JKK:[id number] DOB: 1934-10-22 DOA: 03/30/2015 PCP: Jerlyn Ly, MD  Oncologist: Dr. Marin Olp  HPI/Brief narrative 80 year old female with history of HTN, HLD, diet-controlled DM 2, depression, MGUS, breast cancer status post mastectomy 1981, pancytopenia, hospitalized 8/25-03/21/15 for syncope at which time workup showed possible metastatic disease with lung nodules/adrenal nodules/liver lesions, admitted again to [facility] on 03/30/15 with complaints of weakness, poor oral intake, several days difficulty speaking but no unilateral weakness or visual abnormalities. Mild diarrhea. In the ED, pancytopenic and CT head shows multiple low-attenuation areas probably due to old ischemia but acute process or metastatic lesions not excluded. MRI brain _ no acute findings. She remains confused, extremely weak and poor appetite. Oncology consulted > recommend Adrenal lesion Bx - [facility] initially consulted who have asked IR for same.   Assessment/Plan:  Altered MS - CT head findings as below. - MRI brain negative for metastatic disease or acute findings. Oncology cannot rule out paraneoplastic syndrome. PET scan can be performed as outpatient. - No obvious dysarthria or facial asymmetry. Passed bedside swallow screen. - Unclear reason as to patient's presentation. Urine microscopy and chest x-ray without infectious process. Less likely to be TIA given duration of symptoms.? Related to dehydration. Monitor. - Discussed extensively with daughter [name] on 9/12: did OK for 3 days post recent DC and since then rapid decline with poor PO intake, generalized weakness, inability to walk and confusion. She doesn't think mom has made any improvement since admission. No h/o slurred speech or asymmetric weakness at any time - HIV: neg, RPR: non reactive, TSH 4.4019 and B 12: 1453 - BCx's: neg to date.  Pancytopenia - Unclear etiology.  - Dr. Marin Olp, Oncology input  appreciated> requesting Adrenal Lesion Bx for definitive tissue Bx. Cardiology consulted for clearance in the event that the patient may need laparoscopic surgery,/splenectomy. Patient was found to have a pericardial effusion. No evidence   of tamponade . Cardiology plans to get Pershing Proud today  Coronary artery disease-risk factors include  advanced age, remote tobacco use, HTN, pre DM. Her EKG shows nonspecific T wave abnormality , moderate pericardial effusion on echo that is unchanged. 2D echo with normal LVF and no RWMAs. Seen by cardiology, she has evidence of coronary artery calcifications on chest CT. Lexi scan Myoview at [facility] today  Adrenal nodules Patient underwent biopsy of the left adrenal mass by interventional radiology, oncology recommends to wait on the results of biopsy, if this is nondiagnostic then they would recommend laparotomy or laparoscopy to remove her spleen and get an adrenal biopsy. On stress dose IV Solu-Cortef, will probably need some oral hydrocortisone at discharge  Dehydration with hyponatremia - Continued poor oral intake. Continue IV normal saline hydration,Her BUN is 19 and creatinine 1.14.  MGUS - Oncology working up further  Chronic diastolic CHF - Not on diuretics at home. Compensated.  Diet-controlled DM 2-continue SSI  Hyperlipidemia - Continue statins  Diarrhea - If no further diarrhea reported, DC 'd contact isolation and plans for C. difficile workup  Essential hypertension - Controlled with propranolol  Recent hospitalization for syncope, orthostatic hypotension, acute renal failure and abnormal troponins  NAG - bicarbonate. Better    DVT prophylaxis: Subcutaneous heparin  Code Status: Full Family Communication: Discussed extensively with daughter [name] on 04/01/2015. Disposition Plan: Eventually the patient would need SNF, PT yet to  evaluate   Consultants:  Oncology  CCS  IR  Cardiology  Procedures:  None  Antibiotics:  None   Subjective: Evidently confused but comfortable not chest pain or shortness of breath, afebrile hemodynamically stable overnight.  Objective: Filed Vitals:   04/04/15 1135 04/04/15 1147 04/04/15 1149 04/04/15 1151  BP: 113/58  120/49 128/48  Pulse: 75 94 96 88  Temp:      TempSrc:      Resp:      Height:      Weight:      SpO2:       No intake or output data in the 24 hours ending 04/04/15 1153 Filed Weights   04/02/15 0448 04/03/15 0641 04/04/15 0500  Weight: 63.7 kg (140 lb 6.9 oz) 64.411 kg (142 lb) 60.873 kg (134 lb 3.2 oz)     Exam:  General exam: Pleasant elderly female lying comfortably supine in bed. Chronically ill looking. Dry oral mucosa. Respiratory system: Clear. No increased work of breathing. Cardiovascular system: S1 & S2 heard, RRR. No JVD, murmurs, gallops, clicks or pedal edema.  Gastrointestinal system: Abdomen is nondistended, soft and nontender. Normal bowel sounds heard. Central nervous system: Alert and oriented only to self. No focal neurological deficits. Extremities: Symmetric 5 x 5 power.   Data Reviewed: Basic Metabolic Panel:  Recent Labs Lab 03/31/15 0346 04/01/15 0429 04/01/15 0914 04/02/15 0458 04/04/15 0500  NA 132* 135 134* 132* 140  K 3.6 4.4 4.3 3.7 4.3  CL 99* 104 105 100* 102  CO2 21* 16* 18* 25 28  GLUCOSE 83 77 78 118* 148*  BUN 15 11 10 7 19   CREATININE 1.07* 0.84 0.92 0.86 1.14*  CALCIUM 8.5* 8.7* 9.0 8.5* 9.0   Liver Function Tests:  Recent Labs Lab 03/30/15 2025 03/31/15 0346 04/04/15 0500  AST 31 26 36  ALT 11* 9* 11*  ALKPHOS 116 99 88  BILITOT 1.5* 1.1 0.5  PROT 6.5 5.6* 5.9*  ALBUMIN 3.1* 2.7* 2.9*   No results for input(s): LIPASE, AMYLASE in the last 168 hours. No results for input(s): AMMONIA in the last 168 hours. CBC:  Recent Labs Lab 03/30/15 2025  03/31/15 0346 04/01/15 0914  04/02/15 0458 04/03/15 0559 04/04/15 0500  WBC 2.1*  --  2.0* 1.8* 1.3* 1.2* 8.1  NEUTROABS 1.6*  --   --   --  0.9* 1.0* 7.4  HGB 10.2*  < > 9.7* 10.9* 9.5* 10.2* 9.7*  HCT 29.1*  < > 29.1* 30.9* 28.3* 29.3* 29.4*  MCV 92.7  --  90.7 96.3 91.9 92.4 91.9  PLT 129*  --  124* 104* 101* 122* 116*  < > = values in this interval not displayed. Cardiac Enzymes: No results for input(s): CKTOTAL, CKMB, CKMBINDEX, TROPONINI in the last 168 hours. BNP (last 3 results) No results for input(s): PROBNP in the last 8760 hours. CBG:  Recent Labs Lab 03/31/15 0720 04/01/15 0741 04/02/15 0734 04/03/15 0735 04/04/15 0718  GLUCAP 81 74 106* 162* 131*    Recent Results (from the past 240 hour(s))  Culture, blood (x 2)     Status: None (Preliminary result)   Collection Time: 03/31/15  1:30 AM  Result Value Ref Range Status   Specimen Description BLOOD RIGHT ARM  Final   Special Requests BOTTLES DRAWN AEROBIC ONLY 5CC  Final   Culture   Final    NO GROWTH 3 DAYS Performed at [facility]    Report Status PENDING  Incomplete  Culture, blood (x 2)     Status: None (Preliminary result)   Collection Time: 03/31/15  1:50  AM  Result Value Ref Range Status   Specimen Description BLOOD RIGHT HAND  Final   Special Requests BOTTLES DRAWN AEROBIC ONLY 5CC  Final   Culture   Final    NO GROWTH 3 DAYS Performed at [facility]    Report Status PENDING  Incomplete         Studies: Ct Biopsy  04/03/2015   CLINICAL DATA:  80 year old female with metastatic disease of uncertain etiology. She presents for CT-guided biopsy of left adrenal mass  EXAM: CT BIOPSY  Date: 04/03/2015  PROCEDURE: 1. CT-guided biopsy left adrenal mass Interventional Radiologist:  Criselda Peaches, MD  ANESTHESIA/SEDATION: Moderate (conscious) sedation was used. 1 mg Versed, 25 mcg Fentanyl were administered intravenously. The patient's vital signs were monitored continuously by radiology nursing throughout the  procedure.  Sedation Time: 9 minutes  MEDICATIONS: None additional  TECHNIQUE: Informed consent was obtained from the patient following explanation of the procedure, risks, benefits and alternatives. The patient understands, agrees and consents for the procedure. All questions were addressed. A time out was performed.  A planning axial CT scan was performed. The large mass in the left adrenal gland was successfully identified. A suitable skin entry site was selected and marked. The region was sterilely prepped and draped in standard fashion using Betadine skin prep. Local anesthesia was attained by infiltration with 1% lidocaine. A small dermatotomy was made. Under intermittent CT fluoroscopic guidance, a 15 cm 17 gauge introducer needle was advanced through the retroperitoneal space and into the margin of the left adrenal mass. Multiple 18 gauge core biopsies were then coaxially obtained using the bio Pince automated biopsy device. Biopsy specimens were placed in saline.  The introducer needle was removed. Post biopsy axial CT imaging demonstrates no evidence of complication. Specifically, there is no evidence of pneumothorax or periadrenal hemorrhage. The patient tolerated the procedure well.  COMPLICATIONS: None  Estimated blood loss:  0  IMPRESSION: Technically successful CT-guided core biopsy of left adrenal mass.  Signed,  Criselda Peaches, MD  [facility]  [facility]   Electronically Signed   By: Jacqulynn Cadet M.D.   On: 04/03/2015 16:47        Scheduled Meds: . antiseptic oral rinse  7 mL Mouth Rinse q12n4p  . aspirin EC  81 mg Oral Weekly  . chlorhexidine  15 mL Mouth Rinse BID  . escitalopram  5 mg Oral q morning - 10a  . feeding supplement (ENSURE ENLIVE)  237 mL Oral BID BM  . heparin  5,000 Units Subcutaneous 3 times per day  . hydrocortisone sod succinate (SOLU-CORTEF) inj  100 mg Intravenous Q12H  . multivitamin with minerals  1  tablet Oral Daily  . omega-3 acid ethyl esters  2 g Oral Daily  . pneumococcal 23 valent vaccine  0.5 mL Intramuscular Tomorrow-1000  . propranolol ER  60 mg Oral q morning - 10a  . raloxifene  60 mg Oral q morning - 10a  . rosuvastatin  10 mg Oral q morning - 10a  . sodium chloride  3 mL Intravenous Q12H   Continuous Infusions:    Principal Problem:   Difficulty speaking Active Problems:   MGUS (monoclonal gammopathy of unknown significance)   Adrenal nodule   Chronic diastolic heart failure   Pulmonary nodules/lesions, multiple   DM type 2 (diabetes mellitus, type 2)   Pancytopenia   Generalized weakness   HLD (hyperlipidemia)   Depression   Slurred speech  Protein calorie malnutrition   Diarrhea   Pericardial effusion   Coronary artery calcification seen on CAT scan    Time spent: 30 minutes.    Reyne Dumas, MD,  [facility] Pager [phone]  If 7PM-7AM, please contact night-coverage [email or web address] Password [email or web address] 04/04/2015, 11:53 AM    LOS: 5 days

## 2015-04-05 ENCOUNTER — Inpatient Hospital Stay (HOSPITAL_COMMUNITY): Payer: Medicare Other | Admitting: Certified Registered Nurse Anesthetist

## 2015-04-05 ENCOUNTER — Encounter (HOSPITAL_COMMUNITY): Payer: Self-pay | Admitting: Anesthesiology

## 2015-04-05 ENCOUNTER — Encounter (HOSPITAL_COMMUNITY)
Admission: EM | Disposition: A | Discharge status: Skilled Nursing Facility | Payer: Self-pay | Source: Home / Self Care | Attending: Internal Medicine

## 2015-04-05 DIAGNOSIS — Z9081 Acquired absence of spleen: Secondary | ICD-10-CM

## 2015-04-05 HISTORY — PX: LAPAROSCOPIC SPLENECTOMY: SHX409

## 2015-04-05 LAB — CULTURE, BLOOD (ROUTINE X 2)
Culture: NO GROWTH
Culture: NO GROWTH

## 2015-04-05 LAB — CREATININE, SERUM
CREATININE: 1.08 mg/dL — AB (ref 0.44–1.00)
GFR calc Af Amer: 55 mL/min — ABNORMAL LOW (ref >60)
GFR, EST NON AFRICAN AMERICAN: 48 mL/min — AB (ref >60)

## 2015-04-05 LAB — LACTATE DEHYDROGENASE: LDH: 93 U/L — AB (ref 98–192)

## 2015-04-05 LAB — SURGICAL PCR SCREEN
MRSA, PCR: NEGATIVE
STAPHYLOCOCCUS AUREUS: NEGATIVE

## 2015-04-05 LAB — TYPE AND SCREEN
ABO/RH(D): B POS
Antibody Screen: NEGATIVE

## 2015-04-05 LAB — GLUCOSE, CAPILLARY: GLUCOSE-CAPILLARY: 125 mg/dL — AB (ref 65–99)

## 2015-04-05 SURGERY — SPLENECTOMY, LAPAROSCOPIC
Anesthesia: General | Site: Abdomen

## 2015-04-05 MED ORDER — GLYCOPYRROLATE 0.2 MG/ML IJ SOLN
INTRAMUSCULAR | Status: AC
  Filled 2015-04-05: qty 3

## 2015-04-05 MED ORDER — DEXAMETHASONE SODIUM PHOSPHATE 10 MG/ML IJ SOLN
INTRAMUSCULAR | Status: DC | PRN
  Administered 2015-04-05: 10 mg via INTRAVENOUS

## 2015-04-05 MED ORDER — ESMOLOL HCL 10 MG/ML IV SOLN
INTRAVENOUS | Status: AC
  Filled 2015-04-05: qty 10

## 2015-04-05 MED ORDER — EPHEDRINE SULFATE 50 MG/ML IJ SOLN
INTRAMUSCULAR | Status: DC | PRN
  Administered 2015-04-05 (×2): 10 mg via INTRAVENOUS
  Administered 2015-04-05: 5 mg via INTRAVENOUS
  Administered 2015-04-05 (×2): 10 mg via INTRAVENOUS

## 2015-04-05 MED ORDER — MORPHINE SULFATE (PF) 2 MG/ML IV SOLN
1.0000 mg | INTRAVENOUS | Status: DC | PRN
  Administered 2015-04-05: 1 mg via INTRAVENOUS
  Filled 2015-04-05: qty 1

## 2015-04-05 MED ORDER — PROPOFOL 10 MG/ML IV BOLUS
INTRAVENOUS | Status: AC
  Filled 2015-04-05: qty 20

## 2015-04-05 MED ORDER — 0.9 % SODIUM CHLORIDE (POUR BTL) OPTIME
TOPICAL | Status: DC | PRN
  Administered 2015-04-05: 1000 mL

## 2015-04-05 MED ORDER — DEXTROSE 5 % IV SOLN
INTRAVENOUS | Status: AC
  Filled 2015-04-05: qty 2

## 2015-04-05 MED ORDER — ONDANSETRON HCL 4 MG/2ML IJ SOLN
INTRAMUSCULAR | Status: AC
  Filled 2015-04-05: qty 2

## 2015-04-05 MED ORDER — SODIUM CHLORIDE 0.9 % IJ SOLN
INTRAMUSCULAR | Status: AC
  Filled 2015-04-05: qty 10

## 2015-04-05 MED ORDER — HYDROCODONE-ACETAMINOPHEN 5-325 MG PO TABS
1.0000 | ORAL_TABLET | ORAL | Status: DC | PRN
  Administered 2015-04-05 – 2015-04-06 (×3): 2 via ORAL
  Filled 2015-04-05 (×3): qty 2

## 2015-04-05 MED ORDER — PANTOPRAZOLE SODIUM 40 MG IV SOLR
40.0000 mg | Freq: Every day | INTRAVENOUS | Status: DC
  Administered 2015-04-05 – 2015-04-08 (×4): 40 mg via INTRAVENOUS
  Filled 2015-04-05 (×4): qty 40

## 2015-04-05 MED ORDER — ROCURONIUM BROMIDE 100 MG/10ML IV SOLN
INTRAVENOUS | Status: DC | PRN
  Administered 2015-04-05: 40 mg via INTRAVENOUS
  Administered 2015-04-05 (×2): 10 mg via INTRAVENOUS

## 2015-04-05 MED ORDER — ONDANSETRON 4 MG PO TBDP
4.0000 mg | ORAL_TABLET | Freq: Four times a day (QID) | ORAL | Status: DC | PRN
  Administered 2015-04-12: 4 mg via ORAL
  Filled 2015-04-05 (×2): qty 1

## 2015-04-05 MED ORDER — HEPARIN SODIUM (PORCINE) 5000 UNIT/ML IJ SOLN
5000.0000 [IU] | Freq: Three times a day (TID) | INTRAMUSCULAR | Status: DC
  Administered 2015-04-05 – 2015-04-12 (×19): 5000 [IU] via SUBCUTANEOUS
  Filled 2015-04-05 (×18): qty 1

## 2015-04-05 MED ORDER — FENTANYL CITRATE (PF) 100 MCG/2ML IJ SOLN
INTRAMUSCULAR | Status: DC | PRN
  Administered 2015-04-05: 25 ug via INTRAVENOUS
  Administered 2015-04-05 (×4): 50 ug via INTRAVENOUS
  Administered 2015-04-05: 25 ug via INTRAVENOUS
  Administered 2015-04-05 (×2): 50 ug via INTRAVENOUS

## 2015-04-05 MED ORDER — LIDOCAINE HCL (CARDIAC) 20 MG/ML IV SOLN
INTRAVENOUS | Status: DC | PRN
  Administered 2015-04-05: 70 mg via INTRAVENOUS

## 2015-04-05 MED ORDER — LACTATED RINGERS IR SOLN
Status: DC | PRN
  Administered 2015-04-05: 1000 mL

## 2015-04-05 MED ORDER — FENTANYL CITRATE (PF) 250 MCG/5ML IJ SOLN
INTRAMUSCULAR | Status: AC
  Filled 2015-04-05: qty 25

## 2015-04-05 MED ORDER — ONDANSETRON HCL 4 MG/2ML IJ SOLN
INTRAMUSCULAR | Status: DC | PRN
  Administered 2015-04-05: 4 mg via INTRAVENOUS

## 2015-04-05 MED ORDER — ESMOLOL HCL 10 MG/ML IV SOLN
INTRAVENOUS | Status: DC | PRN
  Administered 2015-04-05: 20 mg via INTRAVENOUS

## 2015-04-05 MED ORDER — BUPIVACAINE LIPOSOME 1.3 % IJ SUSP
INTRAMUSCULAR | Status: DC | PRN
  Administered 2015-04-05: 20 mL

## 2015-04-05 MED ORDER — HYDROMORPHONE HCL 1 MG/ML IJ SOLN: 0.2500 mg | INTRAMUSCULAR | Status: DC | PRN

## 2015-04-05 MED ORDER — PHENYLEPHRINE 40 MCG/ML (10ML) SYRINGE FOR IV PUSH (FOR BLOOD PRESSURE SUPPORT)
PREFILLED_SYRINGE | INTRAVENOUS | Status: AC
  Filled 2015-04-05: qty 10

## 2015-04-05 MED ORDER — LIDOCAINE HCL (CARDIAC) 20 MG/ML IV SOLN
INTRAVENOUS | Status: AC
  Filled 2015-04-05: qty 5

## 2015-04-05 MED ORDER — SUCCINYLCHOLINE CHLORIDE 20 MG/ML IJ SOLN
INTRAMUSCULAR | Status: DC | PRN
  Administered 2015-04-05: 100 mg via INTRAVENOUS

## 2015-04-05 MED ORDER — SODIUM CHLORIDE 0.9 % IV SOLN: Freq: Once | INTRAVENOUS | Status: DC

## 2015-04-05 MED ORDER — LACTATED RINGERS IV SOLN
INTRAVENOUS | Status: DC
  Administered 2015-04-05: 14:00:00 via INTRAVENOUS
  Administered 2015-04-05: 1000 mL via INTRAVENOUS
  Administered 2015-04-05: 12:00:00 via INTRAVENOUS

## 2015-04-05 MED ORDER — DEXAMETHASONE SODIUM PHOSPHATE 10 MG/ML IJ SOLN
INTRAMUSCULAR | Status: AC
  Filled 2015-04-05: qty 1

## 2015-04-05 MED ORDER — LACTATED RINGERS IV SOLN
INTRAVENOUS | Status: DC
  Administered 2015-04-05: 1000 mL via INTRAVENOUS

## 2015-04-05 MED ORDER — NEOSTIGMINE METHYLSULFATE 10 MG/10ML IV SOLN
INTRAVENOUS | Status: DC | PRN
  Administered 2015-04-05: 5 mg via INTRAVENOUS

## 2015-04-05 MED ORDER — ROCURONIUM BROMIDE 100 MG/10ML IV SOLN
INTRAVENOUS | Status: AC
  Filled 2015-04-05: qty 1

## 2015-04-05 MED ORDER — BUPIVACAINE LIPOSOME 1.3 % IJ SUSP
20.0000 mL | Freq: Once | INTRAMUSCULAR | Status: DC
  Filled 2015-04-05: qty 20

## 2015-04-05 MED ORDER — SIMETHICONE 80 MG PO CHEW
40.0000 mg | CHEWABLE_TABLET | Freq: Four times a day (QID) | ORAL | Status: DC | PRN
  Filled 2015-04-05: qty 1

## 2015-04-05 MED ORDER — KCL IN DEXTROSE-NACL 20-5-0.45 MEQ/L-%-% IV SOLN
INTRAVENOUS | Status: DC
  Administered 2015-04-05: 17:00:00 via INTRAVENOUS
  Administered 2015-04-06: 100 mL/h via INTRAVENOUS
  Administered 2015-04-07 – 2015-04-09 (×6): via INTRAVENOUS
  Administered 2015-04-09: 100 mL via INTRAVENOUS
  Administered 2015-04-10 – 2015-04-11 (×2): via INTRAVENOUS
  Filled 2015-04-05 (×14): qty 1000

## 2015-04-05 MED ORDER — SODIUM CHLORIDE 0.9 % IJ SOLN
INTRAMUSCULAR | Status: DC | PRN
  Administered 2015-04-05: 20 mL

## 2015-04-05 MED ORDER — CEFAZOLIN SODIUM-DEXTROSE 2-3 GM-% IV SOLR
2.0000 g | Freq: Three times a day (TID) | INTRAVENOUS | Status: AC
  Administered 2015-04-05: 2 g via INTRAVENOUS
  Filled 2015-04-05: qty 50

## 2015-04-05 MED ORDER — ARTIFICIAL TEARS OP OINT
TOPICAL_OINTMENT | OPHTHALMIC | Status: AC
  Filled 2015-04-05: qty 3.5

## 2015-04-05 MED ORDER — GLYCOPYRROLATE 0.2 MG/ML IJ SOLN
INTRAMUSCULAR | Status: DC | PRN
  Administered 2015-04-05: 0.6 mg via INTRAVENOUS
  Administered 2015-04-05: 0.2 mg via INTRAVENOUS

## 2015-04-05 MED ORDER — PHENYLEPHRINE HCL 10 MG/ML IJ SOLN
INTRAMUSCULAR | Status: AC
  Filled 2015-04-05: qty 1

## 2015-04-05 MED ORDER — PROPOFOL 10 MG/ML IV BOLUS
INTRAVENOUS | Status: DC | PRN
  Administered 2015-04-05: 100 mg via INTRAVENOUS

## 2015-04-05 MED ORDER — ONDANSETRON HCL 4 MG/2ML IJ SOLN
4.0000 mg | Freq: Four times a day (QID) | INTRAMUSCULAR | Status: DC | PRN
Start: 2015-04-05 — End: 2015-04-12

## 2015-04-05 MED ORDER — EPHEDRINE SULFATE 50 MG/ML IJ SOLN
INTRAMUSCULAR | Status: AC
  Filled 2015-04-05: qty 1

## 2015-04-05 SURGICAL SUPPLY — 83 items
APPLIER CLIP ROT 10 11.4 M/L (STAPLE)
BLADE EXTENDED COATED 6.5IN (ELECTRODE) IMPLANT
BLADE HEX COATED 2.75 (ELECTRODE) IMPLANT
BLADE SURG SZ10 CARB STEEL (BLADE) IMPLANT
CLAMP ENDO BABCK 10MM (STAPLE) IMPLANT
CLIP APPLIE ROT 10 11.4 M/L (STAPLE) IMPLANT
CLOSURE WOUND 1/2 X4 (GAUZE/BANDAGES/DRESSINGS)
CONNECTOR 5 IN 1 STRAIGHT STRL (MISCELLANEOUS) ×4 IMPLANT
COVER SURGICAL LIGHT HANDLE (MISCELLANEOUS) ×8 IMPLANT
DECANTER SPIKE VIAL GLASS SM (MISCELLANEOUS) ×4 IMPLANT
DEVICE TROCAR PUNCTURE CLOSURE (ENDOMECHANICALS) IMPLANT
DISSECTOR BLUNT TIP ENDO 5MM (MISCELLANEOUS) ×8 IMPLANT
DRAPE LAPAROSCOPIC ABDOMINAL (DRAPES) ×4 IMPLANT
DRAPE WARM FLUID 44X44 (DRAPE) ×4 IMPLANT
ELECT PENCIL ROCKER SW 15FT (MISCELLANEOUS) ×4 IMPLANT
ELECT REM PT RETURN 9FT ADLT (ELECTROSURGICAL) ×4
ELECTRODE REM PT RTRN 9FT ADLT (ELECTROSURGICAL) ×2 IMPLANT
ENDO GIA UNIVERSAL XLG (ENDOMECHANICALS) ×4 IMPLANT
FILTER SMOKE EVAC LAPAROSHD (FILTER) ×4 IMPLANT
GAUZE SPONGE 4X4 12PLY STRL (GAUZE/BANDAGES/DRESSINGS) ×4 IMPLANT
GLOVE BIOGEL M 8.0 STRL (GLOVE) ×4 IMPLANT
GLOVE BIOGEL PI IND STRL 7.0 (GLOVE) ×2 IMPLANT
GLOVE BIOGEL PI INDICATOR 7.0 (GLOVE) ×2
GOWN SPEC L4 XLG W/TWL (GOWN DISPOSABLE) ×4 IMPLANT
GOWN STRL REUS W/TWL LRG LVL3 (GOWN DISPOSABLE) ×4 IMPLANT
GOWN STRL REUS W/TWL XL LVL3 (GOWN DISPOSABLE) ×16 IMPLANT
HANDLE STAPLE EGIA 4 XL (STAPLE) ×4 IMPLANT
KIT BASIN OR (CUSTOM PROCEDURE TRAY) ×4 IMPLANT
LIGASURE IMPACT 36 18CM CVD LR (INSTRUMENTS) IMPLANT
NS IRRIG 1000ML POUR BTL (IV SOLUTION) ×8 IMPLANT
POUCH ENDO CATCH II 15MM (MISCELLANEOUS) ×4 IMPLANT
RELOAD EGIA 45 MED/THCK PURPLE (STAPLE) IMPLANT
RELOAD EGIA 45 TAN VASC (STAPLE) IMPLANT
RELOAD EGIA 60 MED/THCK PURPLE (STAPLE) IMPLANT
RELOAD EGIA 60 TAN VASC (STAPLE) ×8 IMPLANT
SCISSORS ENDO CVD 5DCS (MISCELLANEOUS) IMPLANT
SCISSORS LAP 5X35 DISP (ENDOMECHANICALS) IMPLANT
SCISSORS LAP 5X45 EPIX DISP (ENDOMECHANICALS) ×4 IMPLANT
SET IRRIG TUBING LAPAROSCOPIC (IRRIGATION / IRRIGATOR) ×4 IMPLANT
SHEARS CURVED HARMONIC AC 45CM (MISCELLANEOUS) ×4 IMPLANT
SHEARS HARMONIC ACE PLUS 36CM (ENDOMECHANICALS) ×4 IMPLANT
SLEEVE ADV FIXATION 5X100MM (TROCAR) IMPLANT
SLEEVE XCEL OPT CAN 5 100 (ENDOMECHANICALS) ×4 IMPLANT
SLEEVE Z-THREAD 5X100MM (TROCAR) IMPLANT
SOLUTION ANTI FOG 6CC (MISCELLANEOUS) ×4 IMPLANT
SPONGE LAP 18X18 X RAY DECT (DISPOSABLE) IMPLANT
STAPLER VISISTAT 35W (STAPLE) ×4 IMPLANT
STRIP CLOSURE SKIN 1/2X4 (GAUZE/BANDAGES/DRESSINGS) IMPLANT
SUCTION POOLE TIP (SUCTIONS) IMPLANT
SUT NOVA 1 T20/GS 25DT (SUTURE) ×12 IMPLANT
SUT PDS AB 1 CT1 27 (SUTURE) IMPLANT
SUT PDS AB 1 CTX 36 (SUTURE) IMPLANT
SUT PROLENE 2 0 KS (SUTURE) IMPLANT
SUT SILK 2 0 (SUTURE)
SUT SILK 2 0 SH CR/8 (SUTURE) IMPLANT
SUT SILK 2-0 18XBRD TIE 12 (SUTURE) IMPLANT
SUT SILK 3 0 (SUTURE)
SUT SILK 3 0 SH CR/8 (SUTURE) IMPLANT
SUT SILK 3-0 18XBRD TIE 12 (SUTURE) IMPLANT
SUT VIC AB 2-0 CT1 27 (SUTURE)
SUT VIC AB 2-0 CT1 27XBRD (SUTURE) IMPLANT
SUT VIC AB 3-0 PS2 18 (SUTURE)
SUT VIC AB 3-0 PS2 18XBRD (SUTURE) IMPLANT
SUT VIC AB 4-0 SH 18 (SUTURE) IMPLANT
SUT VICRYL 0 UR6 27IN ABS (SUTURE) IMPLANT
SYR 30ML LL (SYRINGE) ×4 IMPLANT
SYR BULB IRRIGATION 50ML (SYRINGE) ×4 IMPLANT
TOWEL OR 17X26 10 PK STRL BLUE (TOWEL DISPOSABLE) ×8 IMPLANT
TOWEL OR NON WOVEN STRL DISP B (DISPOSABLE) ×8 IMPLANT
TRAY FOLEY W/METER SILVER 14FR (SET/KITS/TRAYS/PACK) ×8 IMPLANT
TRAY FOLEY W/METER SILVER 16FR (SET/KITS/TRAYS/PACK) ×4 IMPLANT
TRAY LAPAROSCOPIC (CUSTOM PROCEDURE TRAY) ×4 IMPLANT
TROCAR ADV FIXATION 11X100MM (TROCAR) IMPLANT
TROCAR ADV FIXATION 5X100MM (TROCAR) IMPLANT
TROCAR BLADELESS 15MM (ENDOMECHANICALS) ×4 IMPLANT
TROCAR BLADELESS OPT 5 100 (ENDOMECHANICALS) ×4 IMPLANT
TROCAR XCEL 12X100 BLDLESS (ENDOMECHANICALS) ×8 IMPLANT
TROCAR XCEL NON-BLD 11X100MML (ENDOMECHANICALS) ×4 IMPLANT
TROCAR XCEL UNIV SLVE 11M 100M (ENDOMECHANICALS) IMPLANT
TUBING FILTER THERMOFLATOR (ELECTROSURGICAL) ×4 IMPLANT
TUBING INSUFFLATION 10FT LAP (TUBING) ×4 IMPLANT
YANKAUER SUCT BULB TIP 10FT TU (MISCELLANEOUS) ×4 IMPLANT
YANKAUER SUCT BULB TIP NO VENT (SUCTIONS) IMPLANT

## 2015-04-05 NOTE — Progress Notes (Signed)
[name] [name], unfortunately, had a nondiagnostic biopsy of the adrenal gland. It's all necrotic. I spoke with the pathologist. He feels that there is something there but there just is no viable material to be able to make a diagnosis.  I spoke to  Dr. Hassell Done of surgery. He will try to get her to surgery today. He will do a splenectomy. He will also take out when the adrenal nodules.  I still have to believe that there is something going on the spleen that is causing the pancytopenia.  She was able to eat well yesterday. We liberalized her diet.  She had a normal cardiac stress test. As such, this should not be a problem with respect to her having surgery.  She has some slight pain over on the right flank. I suppose that this might be from the adrenal nodule.  Her labs not back yet from today.  On her physical exam, her vital signs look good. Her blood pressure is 130/55. Temperature 98.2. Pulse is 71. Her spleen tip my be palpable with deep inspiration. I cannot palpate her liver. Her lungs are clear without wheezes. Cardiac exam regular rate and rhythm with no murmurs, rubs or bruits. Externally shows no clubbing, cyanosis or edema area and skin exam shows no rashes.  At this point, we will have to await surgery now. I think this will give [place] the diagnosis. We certainly will have enough tissue for the appropriate studies area and I suppose that she could have 2 separate problems although doubt be incredibly unusual.   She certainly is ready for surgery.  I appreciate all the wonderful care that she is getting up on 4 W. She is very appreciative of the compassion that all the staff has shown her.  [name].  Romans 8:28

## 2015-04-05 NOTE — Progress Notes (Addendum)
PROGRESS NOTE    [name] [name] BJY:[id number] DOB: 1935-02-18 DOA: 03/30/2015 PCP: Jerlyn Ly, MD  Oncologist: Dr. Marin Olp  HPI/Brief narrative 80 year old female with history of HTN, HLD, diet-controlled DM 2, depression, MGUS, breast cancer status post mastectomy 1981, pancytopenia, hospitalized 8/25-03/21/15 for syncope at which time workup showed possible metastatic disease with lung nodules/adrenal nodules/liver lesions, admitted again to [facility] on 03/30/15 with complaints of weakness, poor oral intake, several days difficulty speaking but no unilateral weakness or visual abnormalities. Mild diarrhea. In the ED, pancytopenic and CT head shows multiple low-attenuation areas probably due to old ischemia but acute process or metastatic lesions not excluded. MRI brain _ no acute findings. She remains confused, extremely weak and poor appetite. Oncology consulted > recommend Adrenal lesion Bx - CCS initially consulted who have asked IR for same.   Assessment/Plan:  Altered MS unclear etiology - MRI brain negative for metastatic disease or acute findings. Oncology cannot rule out paraneoplastic syndrome. PET scan can be performed as outpatient. - No obvious dysarthria or facial asymmetry. Passed bedside swallow screen. -Infectious workup negative. Less likely to be TIA given duration of symptoms.? Related to dehydration. Monitor. - Dr Algis Liming Discussed extensively with daughter [name] on 9/12: did OK for 3 days post recent DC and since then rapid decline with poor PO intake, generalized weakness, inability to walk and confusion. She doesn't think mom has made any improvement since admission. No h/o slurred speech or asymmetric weakness at any time - HIV: neg, RPR: non reactive, TSH 4.4019 and B 12: 1453 - BCx's: neg to date. Patient pleasantly confused to her surroundings   Pancytopenia/splenomegaly/adrenal nodules When blood cell count has improved, likely secondary to her  underlying malignant process Patient CT-guided biopsy of adrenal gland was nondiagnostic - Dr. Marin Olp, requested Dr. Hassell Done from general surgery to proceed with laparoscopic splenectomy and excision of the adrenal nodules. Patient to undergo surgery today. Cardiology has given clearance to proceed with surgery. Patient was found to have a pericardial effusion. No evidence   of tamponade . Negative Pershing Proud 9/15 On stress dose IV Solu-Cortef, will probably need some oral hydrocortisone at discharge Outpatient PET scan   Coronary artery disease-risk factors include  advanced age, remote tobacco use, HTN, pre DM. Her EKG shows nonspecific T wave abnormality , moderate pericardial effusion on echo that is unchanged. 2D echo with normal LVF and no RWMAs. Seen by cardiology, she has evidence of coronary artery calcifications on chest CT Myoview on 9/15 was negative.Okay to proceed with surgery Cardiology has signed off   Dehydration with hyponatremia - Continue IV normal saline hydration, follow labs  MGUS - Oncology working up further  Chronic diastolic CHF - Not on diuretics at home. Compensated.  Diet-controlled DM 2-continue SSI  Hyperlipidemia - Continue statins  Diarrhea Resolved,  Essential hypertension - Controlled with propranolol  Recent hospitalization for syncope, orthostatic hypotension, acute renal failure and abnormal troponins  NAG Resolved    DVT prophylaxis: Subcutaneous heparin  Code Status: Full Family Communication: Discussed extensively with daughter [name] on 04/01/2015. Disposition Plan: Eventually the patient would need SNF, anticipate discharge next week   Consultants:  Oncology  CCS  IR  Cardiology  Procedures:  None  Antibiotics:  None   Subjective: Nothing by mouth for surgery, stable overnight  Objective: Filed Vitals:   04/05/15 0500 04/05/15 0510 04/05/15 1420 04/05/15 1430  BP:  130/55 132/81 128/69    Pulse:  71 77 73  Temp:  98.2  F (36.8 C) 98.7 F (37.1 C)   TempSrc:  Oral    Resp:  16 16 14   Height:      Weight: 61.292 kg (135 lb 2 oz)     SpO2:  99% 100% 100%    Intake/Output Summary (Last 24 hours) at 04/05/15 1436 Last data filed at 04/05/15 1422  Gross per 24 hour  Intake   2850 ml  Output     50 ml  Net   2800 ml   Filed Weights   04/03/15 0641 04/04/15 0500 04/05/15 0500  Weight: 64.411 kg (142 lb) 60.873 kg (134 lb 3.2 oz) 61.292 kg (135 lb 2 oz)     Exam:  General exam: Pleasant elderly female lying comfortably supine in bed. Chronically ill looking. Dry oral mucosa. Respiratory system: Clear. No increased work of breathing. Cardiovascular system: S1 & S2 heard, RRR. No JVD, murmurs, gallops, clicks or pedal edema.  Gastrointestinal system: Abdomen is nondistended, soft and nontender. Normal bowel sounds heard. Central nervous system: Alert and oriented only to self. No focal neurological deficits. Extremities: Symmetric 5 x 5 power.   Data Reviewed: Basic Metabolic Panel:  Recent Labs Lab 03/31/15 0346 04/01/15 0429 04/01/15 0914 04/02/15 0458 04/04/15 0500  NA 132* 135 134* 132* 140  K 3.6 4.4 4.3 3.7 4.3  CL 99* 104 105 100* 102  CO2 21* 16* 18* 25 28  GLUCOSE 83 77 78 118* 148*  BUN 15 11 10 7 19   CREATININE 1.07* 0.84 0.92 0.86 1.14*  CALCIUM 8.5* 8.7* 9.0 8.5* 9.0   Liver Function Tests:  Recent Labs Lab 03/30/15 2025 03/31/15 0346 04/04/15 0500  AST 31 26 36  ALT 11* 9* 11*  ALKPHOS 116 99 88  BILITOT 1.5* 1.1 0.5  PROT 6.5 5.6* 5.9*  ALBUMIN 3.1* 2.7* 2.9*   No results for input(s): LIPASE, AMYLASE in the last 168 hours. No results for input(s): AMMONIA in the last 168 hours. CBC:  Recent Labs Lab 03/30/15 2025  03/31/15 0346 04/01/15 0914 04/02/15 0458 04/03/15 0559 04/04/15 0500  WBC 2.1*  --  2.0* 1.8* 1.3* 1.2* 8.1  NEUTROABS 1.6*  --   --   --  0.9* 1.0* 7.4  HGB 10.2*  < > 9.7* 10.9* 9.5* 10.2* 9.7*  HCT  29.1*  < > 29.1* 30.9* 28.3* 29.3* 29.4*  MCV 92.7  --  90.7 96.3 91.9 92.4 91.9  PLT 129*  --  124* 104* 101* 122* 116*  < > = values in this interval not displayed. Cardiac Enzymes: No results for input(s): CKTOTAL, CKMB, CKMBINDEX, TROPONINI in the last 168 hours. BNP (last 3 results) No results for input(s): PROBNP in the last 8760 hours. CBG:  Recent Labs Lab 04/01/15 0741 04/02/15 0734 04/03/15 0735 04/04/15 0718 04/05/15 0755  GLUCAP 74 106* 162* 131* 125*    Recent Results (from the past 240 hour(s))  Culture, blood (x 2)     Status: None   Collection Time: 03/31/15  1:30 AM  Result Value Ref Range Status   Specimen Description BLOOD RIGHT ARM  Final   Special Requests BOTTLES DRAWN AEROBIC ONLY 5CC  Final   Culture   Final    NO GROWTH 5 DAYS Performed at [facility]    Report Status 04/05/2015 FINAL  Final  Culture, blood (x 2)     Status: None   Collection Time: 03/31/15  1:50 AM  Result Value Ref Range Status   Specimen Description BLOOD RIGHT  HAND  Final   Special Requests BOTTLES DRAWN AEROBIC ONLY 5CC  Final   Culture   Final    NO GROWTH 5 DAYS Performed at [facility]    Report Status 04/05/2015 FINAL  Final  Surgical pcr screen     Status: None   Collection Time: 04/04/15 10:36 PM  Result Value Ref Range Status   MRSA, PCR NEGATIVE NEGATIVE Final   Staphylococcus aureus NEGATIVE NEGATIVE Final    Comment:        The Xpert SA Assay (FDA approved for NASAL specimens in patients over 25 years of age), is one component of a comprehensive surveillance program.  Test performance has been validated by [facility] for patients greater than or equal to 71 year old. It is not intended to diagnose infection nor to guide or monitor treatment.          Studies: Nm Myocar Multi W/spect W/wall Motion / Ef  04/04/2015    There was no ST segment deviation noted during stress.  The study is normal.  This is a low risk study. No  ischemia.  Nuclear stress EF: 76%.   Low risk study. No ischemia identified. Normal EF.         Scheduled Meds: . sodium chloride   Intravenous Once  . antiseptic oral rinse  7 mL Mouth Rinse q12n4p  . aspirin EC  81 mg Oral Weekly  . bupivacaine liposome  20 mL Infiltration Once  . chlorhexidine  15 mL Mouth Rinse BID  . escitalopram  5 mg Oral q morning - 10a  . feeding supplement (ENSURE ENLIVE)  237 mL Oral BID BM  . heparin  5,000 Units Subcutaneous 3 times per day  . hydrocortisone sod succinate (SOLU-CORTEF) inj  100 mg Intravenous Q12H  . multivitamin with minerals  1 tablet Oral Daily  . omega-3 acid ethyl esters  2 g Oral Daily  . pneumococcal 23 valent vaccine  0.5 mL Intramuscular Tomorrow-1000  . propranolol ER  60 mg Oral q morning - 10a  . raloxifene  60 mg Oral q morning - 10a  . rosuvastatin  10 mg Oral q morning - 10a  . sodium chloride  3 mL Intravenous Q12H   Continuous Infusions: . lactated ringers Stopped (04/05/15 1359)  . lactated ringers 1,000 mL (04/05/15 1028)    Principal Problem:   Difficulty speaking Active Problems:   MGUS (monoclonal gammopathy of unknown significance)   Adrenal nodule   Chronic diastolic heart failure   Pulmonary nodules/lesions, multiple   DM type 2 (diabetes mellitus, type 2)   Pancytopenia   Generalized weakness   HLD (hyperlipidemia)   Depression   Slurred speech   Protein calorie malnutrition   Diarrhea   Pericardial effusion   Coronary artery calcification seen on CAT scan    Time spent: 30 minutes.    Reyne Dumas, MD,  [facility] Pager [phone]  If 7PM-7AM, please contact night-coverage [email or web address] Password [email or web address] 04/05/2015, 2:36 PM    LOS: 6 days

## 2015-04-05 NOTE — Anesthesia Preprocedure Evaluation (Addendum)
Anesthesia Evaluation  Patient identified by MRN, date of birth, ID band Patient awake  General Assessment Comment: Abnormal WBC count    low, h/o . Other and unspecified hyperlipidemia  . Unspecified essential hypertension  . OP (osteoporosis)  . Vitamin D deficiency  . MGUS (monoclonal gammopathy of unknown significance) 03/16/2013 . Back pain  . Breast cancer 1981   mastectomy and lymph node removal . DM type 2 (diabetes mellitus, type 2)    Pt states she is 'prediabetic'       Reviewed: Allergy & Precautions, NPO status , Patient's Chart, lab work & pertinent test results  Airway Mallampati: II  TM Distance: >3 FB Neck ROM: Full    Dental no notable dental hx.    Pulmonary former smoker,    Pulmonary exam normal breath sounds clear to auscultation       Cardiovascular Exercise Tolerance: Poor hypertension, Pt. on medications and Pt. on home beta blockers + CAD  Normal cardiovascular exam Rhythm:Regular Rate:Normal  ECHO: 04-02-15 reviewed: EF 55-60%   Neuro/Psych PSYCHIATRIC DISORDERS Depression Recent difficulty speaking, slurred speech. Thought possible secondary to metastatic disease.  Generalized weakness. negative neurological ROS     GI/Hepatic negative GI ROS, Neg liver ROS,   Endo/Other  diabetes, Type 2  Renal/GU Renal disease  negative genitourinary   Musculoskeletal negative musculoskeletal ROS (+)   Abdominal   Peds negative pediatric ROS (+)  Hematology  (+) anemia , MGUS.  Platelet count 116K  HGB 9.7   Anesthesia Other Findings   Reproductive/Obstetrics negative OB ROS                          Anesthesia Physical Anesthesia Plan  ASA: III  Anesthesia Plan: General   Post-op Pain Management:    Induction: Intravenous  Airway Management Planned: Oral ETT  Additional Equipment:   Intra-op Plan:    Post-operative Plan: Extubation in OR  Informed Consent: I have reviewed the patients History and Physical, chart, labs and discussed the procedure including the risks, benefits and alternatives for the proposed anesthesia with the patient or authorized representative who has indicated his/her understanding and acceptance.   Dental advisory given  Plan Discussed with: CRNA  Anesthesia Plan Comments:         Anesthesia Quick Evaluation

## 2015-04-05 NOTE — H&P (View-Only) (Signed)
Subjective: She is fine awaiting stress test.  She had the biopsy yesterday. Awaiting results.  Objective: Vital signs in last 24 hours: Temp:  [97.7 F (36.5 C)-98.4 F (36.9 C)] 97.7 F (36.5 C) (09/15 0618) Pulse Rate:  [76-83] 83 (09/15 0618) Resp:  [16-23] 18 (09/15 0618) BP: (96-140)/(43-63) 120/53 mmHg (09/15 0618) SpO2:  [30 %-100 %] 100 % (09/15 0618) Weight:  [60.873 kg (134 lb 3.2 oz)] 60.873 kg (134 lb 3.2 oz) (09/15 0500) Last BM Date: 04/03/15 Nothing PO recorded 1 stool recorded Afebrile, VSS creatinine is up some Big improvement in the WBC from admit and yesterday CT biopsy completed yesterday Intake/Output from previous day:   Intake/Output this shift:    General appearance: alert, cooperative and no distress GI: soft, non-tender; bowel sounds normal; no masses,  no organomegaly  Lab Results:   Recent Labs  04/03/15 0559 04/04/15 0500  WBC 1.2* 8.1  HGB 10.2* 9.7*  HCT 29.3* 29.4*  PLT 122* 116*    BMET  Recent Labs  04/02/15 0458 04/04/15 0500  NA 132* 140  K 3.7 4.3  CL 100* 102  CO2 25 28  GLUCOSE 118* 148*  BUN 7 19  CREATININE 0.86 1.14*  CALCIUM 8.5* 9.0   PT/INR  Recent Labs  04/03/15 0559  LABPROT 16.0*  INR 1.26     Recent Labs Lab 03/30/15 2025 03/31/15 0346 04/04/15 0500  AST 31 26 36  ALT 11* 9* 11*  ALKPHOS 116 99 88  BILITOT 1.5* 1.1 0.5  PROT 6.5 5.6* 5.9*  ALBUMIN 3.1* 2.7* 2.9*     Lipase     Component Value Date/Time   LIPASE 54* 03/14/2015 1905     Studies/Results: Ct Biopsy  04/03/2015   CLINICAL DATA:  80 year old female with metastatic disease of uncertain etiology. She presents for CT-guided biopsy of left adrenal mass  EXAM: CT BIOPSY  Date: 04/03/2015  PROCEDURE: 1. CT-guided biopsy left adrenal mass Interventional Radiologist:  Criselda Peaches, MD  ANESTHESIA/SEDATION: Moderate (conscious) sedation was used. 1 mg Versed, 25 mcg Fentanyl were administered intravenously. The  patient's vital signs were monitored continuously by radiology nursing throughout the procedure.  Sedation Time: 9 minutes  MEDICATIONS: None additional  TECHNIQUE: Informed consent was obtained from the patient following explanation of the procedure, risks, benefits and alternatives. The patient understands, agrees and consents for the procedure. All questions were addressed. A time out was performed.  A planning axial CT scan was performed. The large mass in the left adrenal gland was successfully identified. A suitable skin entry site was selected and marked. The region was sterilely prepped and draped in standard fashion using Betadine skin prep. Local anesthesia was attained by infiltration with 1% lidocaine. A small dermatotomy was made. Under intermittent CT fluoroscopic guidance, a 15 cm 17 gauge introducer needle was advanced through the retroperitoneal space and into the margin of the left adrenal mass. Multiple 18 gauge core biopsies were then coaxially obtained using the bio Pince automated biopsy device. Biopsy specimens were placed in saline.  The introducer needle was removed. Post biopsy axial CT imaging demonstrates no evidence of complication. Specifically, there is no evidence of pneumothorax or periadrenal hemorrhage. The patient tolerated the procedure well.  COMPLICATIONS: None  Estimated blood loss:  0  IMPRESSION: Technically successful CT-guided core biopsy of left adrenal mass.  Signed,  Criselda Peaches, MD  [facility]  [facility]   Electronically Signed   By: Jacqulynn Cadet  M.D.   On: 04/03/2015 16:47    Medications: . antiseptic oral rinse  7 mL Mouth Rinse q12n4p  . aspirin EC  81 mg Oral Weekly  . chlorhexidine  15 mL Mouth Rinse BID  . escitalopram  5 mg Oral q morning - 10a  . feeding supplement (ENSURE ENLIVE)  237 mL Oral BID BM  . heparin  5,000 Units Subcutaneous 3 times per day  . hydrocortisone sod succinate  (SOLU-CORTEF) inj  100 mg Intravenous Q12H  . multivitamin with minerals  1 tablet Oral Daily  . omega-3 acid ethyl esters  2 g Oral Daily  . pneumococcal 23 valent vaccine  0.5 mL Intramuscular Tomorrow-1000  . propranolol ER  60 mg Oral q morning - 10a  . raloxifene  60 mg Oral q morning - 10a  . regadenoson  0.4 mg Intravenous Once  . rosuvastatin  10 mg Oral q morning - 10a  . sodium chloride  3 mL Intravenous Q12H    Assessment/Plan Adrenal Nodules Mental status changes Pulmonary nodules/lesions Chronic diastolic heart failure Pancytopenia Essential hypertension Hyperlipidemia Diet-controlled DM2 Antibiotics: None DVT: no heparin currently/pancytopenia/SCD's added        Plan:  Will await biopsy results and recommendations by Dr. Marin Olp.       LOS: 5 days    Marquice Uddin 04/04/2015

## 2015-04-05 NOTE — Anesthesia Postprocedure Evaluation (Signed)
  Anesthesia Post-op Note  Patient: [name] [name]  Procedure(s) Performed: Procedure(s) (LRB): LAPAROSCOPIC SPLENECTOMY (N/A)  Patient Location: PACU  Anesthesia Type: General  Level of Consciousness: awake and alert   Airway and Oxygen Therapy: Patient Spontanous Breathing  Post-op Pain: mild  Post-op Assessment: Post-op Vital signs reviewed, Patient's Cardiovascular Status Stable, Respiratory Function Stable, Patent Airway and No signs of Nausea or vomiting  Last Vitals:  Filed Vitals:   04/05/15 1535  BP: 127/62  Pulse: 65  Temp: 36.9 C  Resp: 14    Post-op Vital Signs: stable   Complications: No apparent anesthesia complications. To stepdown unit per plan.

## 2015-04-05 NOTE — Brief Op Note (Signed)
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 female  PRE-OPERATIVE DIAGNOSIS:  Pancytopenia and splenomegaly; bilateral adrenal masses with history of left breast cancer  POST-OPERATIVE DIAGNOSIS:  same  PROCEDURE:  Procedure(s): LAPAROSCOPIC SPLENECTOMY (N/A)  SURGEON:  Surgeon(s) and Role:    * Johnathan Hausen, MD - Primary    * Armandina Gemma, MD - Assisting  PHYSICIAN ASSISTANT:   ASSISTANTS: Armandina Gemma, MD, FACS   ANESTHESIA:   general  EBL:  Total I/O In: 2800 [I.V.:2800] Out: 50 [Blood:50]  BLOOD ADMINISTERED:none  DRAINS: none   LOCAL MEDICATIONS USED:  BUPIVICAINE   SPECIMEN:  Source of Specimen:  spleen  DISPOSITION OF SPECIMEN:  PATHOLOGY  COUNTS:  YES  TOURNIQUET:  * No tourniquets in log *  DICTATION: .Other Dictation: Dictation Number [id number]  PLAN OF CARE: patient will go to stepdown postop  PATIENT DISPOSITION:  PACU - hemodynamically stable.   Delay start of Pharmacological VTE agent (>24hrs) due to surgical blood loss or risk of bleeding: yes

## 2015-04-05 NOTE — Interval H&P Note (Signed)
History and Physical Interval Note:  04/05/2015 10:25 AM  [name] [name]  has presented today for surgery, with the diagnosis of Adrenal Nodule  The various methods of treatment have been discussed with the patient and family. After consideration of risks, benefits and other options for treatment, the patient has consented to  Procedure(s): LAPAROSCOPIC SPLENECTOMY (N/A) LAPAROSCOPIC ADRENALECTOMY (N/A) as a surgical intervention .  The patient's history has been reviewed, patient examined, no change in status, stable for surgery.  I have reviewed the patient's chart and labs.  I have gotten permission to give her transfusions if needed.   Questions were answered to the patient's satisfaction.     MARTIN,MATTHEW B

## 2015-04-05 NOTE — Progress Notes (Signed)
OT Cancellation Note  Patient Details Name: Iran Kievit MRN: [id number] DOB: 1934/12/14   Cancelled Treatment:    Reason Eval/Treat Not Completed: Patient at procedure or test/ unavailable--in surgery.  SPENCER,MARYELLEN 04/05/2015, 12:42 PM  Lesle Chris, OTR/L [phone] 04/05/2015

## 2015-04-05 NOTE — Progress Notes (Signed)
Approval per Dr Hassell Done, MD to use limited extremity hand for IV use for surgery.  Patient agreeable.

## 2015-04-05 NOTE — Progress Notes (Signed)
CSW continuing to follow.   Per RN, pt in surgery today.   CSW to follow up with pt and pt family regarding SNF decision.   Per RN, anticipate that pt will be in hospital through the weekend.   CSW to continue to follow.  Alison Murray, MSW, [place] [employer] [phone]

## 2015-04-05 NOTE — Progress Notes (Signed)
Physical Therapy Treatment Patient Details Name: [name] [name] MRN: [id number] DOB: [birth year] Today's Date: 04-20-2015    History of Present Illness 80 y/o female who was admitted on 03/30/15 for mental status changes, weakness, difficulty speaking. She has a PMH significant for MGUS, hospitalized 8/25-03/21/15 for syncope at which time workup showed possible metastatic disease with lung nodules/adrenal nodules/liver lesions, pancytopenia, diastolic CHF, DM2, HLD, and depression.  Pt for splenectomy and adrenalectomy Apr 20, 2023    PT Comments    Pt progressing with mobility however also scheduled for surgery today.  Follow Up Recommendations  Supervision/Assistance - 24 hour;SNF     Equipment Recommendations  Rolling walker with 5" wheels    Recommendations for Other Services       Precautions / Restrictions Precautions Precautions: Fall    Mobility  Bed Mobility Overal bed mobility: Needs Assistance Bed Mobility: Supine to Sit;Sit to Supine     Supine to sit: Supervision Sit to supine: Supervision      Transfers Overall transfer level: Needs assistance Equipment used: Rolling walker (2 wheeled) Transfers: Sit to/from Stand Sit to Stand: Min guard         General transfer comment: verbal cues for safe technique, cue to use grab bar for standing from toilet (low surface)  Ambulation/Gait Ambulation/Gait assistance: Min guard Ambulation Distance (Feet): 200 Feet Assistive device: Rolling walker (2 wheeled) Gait Pattern/deviations: Step-through pattern;Decreased stride length     General Gait Details: verbal cues for RW positioning and posture, gait much improved compared to previous session   Stairs            Wheelchair Mobility    Modified Rankin (Stroke Patients Only)       Balance                                    Cognition Arousal/Alertness: Awake/alert Behavior During Therapy: WFL for tasks assessed/performed Overall Cognitive  Status: Within Functional Limits for tasks assessed                      Exercises      General Comments        Pertinent Vitals/Pain Pain Assessment: No/denies pain    Home Living                      Prior Function            PT Goals (current goals can now be found in the care plan section) Progress towards PT goals: Progressing toward goals    Frequency  Min 3X/week    PT Plan Current plan remains appropriate    Co-evaluation             End of Session Equipment Utilized During Treatment: Gait belt Activity Tolerance: Patient tolerated treatment well Patient left: in bed;with call bell/phone within reach;with family/visitor present     Time: 2993-7169 PT Time Calculation (min) (ACUTE ONLY): 18 min  Charges:  $Gait Training: 8-22 mins                    G Codes:      LEMYRE,KATHrine E 04-20-15, 1:18 PM Carmelia Bake, PT, DPT 04-20-2015 Pager: [phone]

## 2015-04-05 NOTE — Transfer of Care (Signed)
Immediate Anesthesia Transfer of Care Note  Patient: [name] [name]  Procedure(s) Performed: Procedure(s): LAPAROSCOPIC SPLENECTOMY (N/A)  Patient Location: PACU  Anesthesia Type:General  Level of Consciousness:  sedated, patient cooperative and responds to stimulation  Airway & Oxygen Therapy:Patient Spontanous Breathing and Patient connected to face mask oxgen  Post-op Assessment:  Report given to PACU RN and Post -op Vital signs reviewed and stable  Post vital signs:  Reviewed and stable  Last Vitals:  Filed Vitals:   04/05/15 0510  BP: 130/55  Pulse: 71  Temp: 36.8 C  Resp: 16    Complications: No apparent anesthesia complications

## 2015-04-05 NOTE — Anesthesia Procedure Notes (Signed)
Procedure Name: Intubation Date/Time: 04/05/2015 11:24 AM Performed by: Maxwell Caul Pre-anesthesia Checklist: Patient identified, Emergency Drugs available, Suction available and Patient being monitored Patient Re-evaluated:Patient Re-evaluated prior to inductionOxygen Delivery Method: Circle System Utilized Preoxygenation: Pre-oxygenation with 100% oxygen Intubation Type: IV induction Ventilation: Mask ventilation without difficulty Laryngoscope Size: Mac and 4 Grade View: Grade I Tube type: Oral Tube size: 7.0 mm Number of attempts: 1 Airway Equipment and Method: Stylet Placement Confirmation: ETT inserted through vocal cords under direct vision,  positive ETCO2 and breath sounds checked- equal and bilateral Secured at: 20 cm Tube secured with: Tape Dental Injury: Teeth and Oropharynx as per pre-operative assessment

## 2015-04-06 DIAGNOSIS — Z9081 Acquired absence of spleen: Secondary | ICD-10-CM

## 2015-04-06 LAB — CBC WITH DIFFERENTIAL/PLATELET
BASOS PCT: 0 %
Basophils Absolute: 0 10*3/uL (ref 0.0–0.1)
EOS PCT: 0 %
Eosinophils Absolute: 0 10*3/uL (ref 0.0–0.7)
HCT: 23.8 % — ABNORMAL LOW (ref 36.0–46.0)
HEMOGLOBIN: 7.6 g/dL — AB (ref 12.0–15.0)
LYMPHS PCT: 5 %
Lymphs Abs: 0.5 10*3/uL — ABNORMAL LOW (ref 0.7–4.0)
MCH: 29.9 pg (ref 26.0–34.0)
MCHC: 31.9 g/dL (ref 30.0–36.0)
MCV: 93.7 fL (ref 78.0–100.0)
MONOS PCT: 12 %
Monocytes Absolute: 1.1 10*3/uL — ABNORMAL HIGH (ref 0.1–1.0)
NEUTROS PCT: 83 %
Neutro Abs: 7.5 10*3/uL (ref 1.7–7.7)
Platelets: 214 10*3/uL (ref 150–400)
RBC: 2.54 MIL/uL — AB (ref 3.87–5.11)
RDW: 14.7 % (ref 11.5–15.5)
WBC: 9.1 10*3/uL (ref 4.0–10.5)

## 2015-04-06 LAB — COMPREHENSIVE METABOLIC PANEL
ALT: 18 U/L (ref 14–54)
ANION GAP: 4 — AB (ref 5–15)
AST: 44 U/L — ABNORMAL HIGH (ref 15–41)
Albumin: 2.3 g/dL — ABNORMAL LOW (ref 3.5–5.0)
Alkaline Phosphatase: 74 U/L (ref 38–126)
BUN: 16 mg/dL (ref 6–20)
CHLORIDE: 106 mmol/L (ref 101–111)
CO2: 29 mmol/L (ref 22–32)
Calcium: 7.9 mg/dL — ABNORMAL LOW (ref 8.9–10.3)
Creatinine, Ser: 0.9 mg/dL (ref 0.44–1.00)
GFR calc non Af Amer: 60 mL/min — ABNORMAL LOW (ref >60)
Glucose, Bld: 160 mg/dL — ABNORMAL HIGH (ref 65–99)
POTASSIUM: 4.2 mmol/L (ref 3.5–5.1)
SODIUM: 139 mmol/L (ref 135–145)
Total Bilirubin: 0.3 mg/dL (ref 0.3–1.2)
Total Protein: 4.5 g/dL — ABNORMAL LOW (ref 6.5–8.1)

## 2015-04-06 LAB — CBC
HEMATOCRIT: 26.4 % — AB (ref 36.0–46.0)
HEMOGLOBIN: 8.4 g/dL — AB (ref 12.0–15.0)
MCH: 29.8 pg (ref 26.0–34.0)
MCHC: 31.8 g/dL (ref 30.0–36.0)
MCV: 93.6 fL (ref 78.0–100.0)
Platelets: 217 10*3/uL (ref 150–400)
RBC: 2.82 MIL/uL — ABNORMAL LOW (ref 3.87–5.11)
RDW: 14.6 % (ref 11.5–15.5)
WBC: 8.7 10*3/uL (ref 4.0–10.5)

## 2015-04-06 MED ORDER — HYDROCORTISONE NA SUCCINATE PF 100 MG IJ SOLR
50.0000 mg | Freq: Two times a day (BID) | INTRAMUSCULAR | Status: DC
  Administered 2015-04-06 – 2015-04-08 (×4): 50 mg via INTRAVENOUS
  Filled 2015-04-06 (×4): qty 2

## 2015-04-06 NOTE — Progress Notes (Addendum)
1 Day Post-Op  Subjective: Has some abdominal soreness but no significant pain.  No nausea.  Objective: Vital signs in last 24 hours: Temp:  [97.7 F (36.5 C)-98.7 F (37.1 C)] 97.7 F (36.5 C) (09/17 0400) Pulse Rate:  [57-77] 57 (09/17 0400) Resp:  [11-17] 11 (09/17 0400) BP: (98-132)/(30-81) 98/36 mmHg (09/17 0400) SpO2:  [94 %-100 %] 94 % (09/17 0400) Arterial Line BP: (119-144)/(49-61) 119/49 mmHg (09/16 1515) Weight:  [66 kg (145 lb 8.1 oz)-66.1 kg (145 lb 11.6 oz)] 66 kg (145 lb 8.1 oz) (09/17 0400) Last BM Date: 04/03/15  Intake/Output from previous day: 09/16 0701 - 09/17 0700 In: 3728.3 [P.O.:120; I.V.:3558.3; IV Piggyback:50] Out: 825 [Urine:775; Blood:50] Intake/Output this shift:    PE: General- In NAD Abdomen-soft, dressings dry  Lab Results:   Recent Labs  04/05/15 2335 04/06/15 0430  WBC 8.7 9.1  HGB 8.4* 7.6*  HCT 26.4* 23.8*  PLT 217 214   BMET  Recent Labs  04/04/15 0500 04/05/15 1435 04/06/15 0430  NA 140  --  139  K 4.3  --  4.2  CL 102  --  106  CO2 28  --  29  GLUCOSE 148*  --  160*  BUN 19  --  16  CREATININE 1.14* 1.08* 0.90  CALCIUM 9.0  --  7.9*   PT/INR No results for input(s): LABPROT, INR in the last 72 hours. Comprehensive Metabolic Panel:    Component Value Date/Time   NA 139 04/06/2015 0430   NA 140 04/04/2015 0500   NA 137 10/08/2014 1321   K 4.2 04/06/2015 0430   K 4.3 04/04/2015 0500   K 3.3 10/08/2014 1321   CL 106 04/06/2015 0430   CL 102 04/04/2015 0500   CL 98 10/08/2014 1321   CO2 29 04/06/2015 0430   CO2 28 04/04/2015 0500   CO2 31 10/08/2014 1321   BUN 16 04/06/2015 0430   BUN 19 04/04/2015 0500   BUN 5* 10/08/2014 1321   CREATININE 0.90 04/06/2015 0430   CREATININE 1.08* 04/05/2015 1435   CREATININE 1.0 10/08/2014 1321   GLUCOSE 160* 04/06/2015 0430   GLUCOSE 148* 04/04/2015 0500   GLUCOSE 98 10/08/2014 1321   CALCIUM 7.9* 04/06/2015 0430   CALCIUM 9.0 04/04/2015 0500   CALCIUM 9.4  10/08/2014 1321   AST 44* 04/06/2015 0430   AST 36 04/04/2015 0500   AST 29 10/08/2014 1321   ALT 18 04/06/2015 0430   ALT 11* 04/04/2015 0500   ALT 15 10/08/2014 1321   ALKPHOS 74 04/06/2015 0430   ALKPHOS 88 04/04/2015 0500   ALKPHOS 71 10/08/2014 1321   BILITOT 0.3 04/06/2015 0430   BILITOT 0.5 04/04/2015 0500   BILITOT 0.80 10/08/2014 1321   PROT 4.5* 04/06/2015 0430   PROT 5.9* 04/04/2015 0500   PROT 6.9 10/08/2014 1321   ALBUMIN 2.3* 04/06/2015 0430   ALBUMIN 2.9* 04/04/2015 0500     Studies/Results: Nm Myocar Multi W/spect W/wall Motion / Ef  04/04/2015    There was no ST segment deviation noted during stress.  The study is normal.  This is a low risk study. No ischemia.  Nuclear stress EF: 76%.   Low risk study. No ischemia identified. Normal EF.     Anti-infectives: Anti-infectives    Start     Dose/Rate Route Frequency Ordered Stop   04/05/15 2000  ceFAZolin (ANCEF) IVPB 2 g/50 mL premix     2 g 100 mL/hr over 30 Minutes Intravenous 3 times per  day 04/05/15 1537 04/05/15 2056   04/05/15 0600  cefOXitin (MEFOXIN) 2 g in dextrose 5 % 50 mL IVPB    Comments:  Pharmacy may adjust dosing strength, interval, or rate of medication as needed for optimal therapy for the patient Send with patient on call to the OR.  Anesthesia to complete antibiotic administration <34min prior to incision per [facility].   2 g 100 mL/hr over 30 Minutes Intravenous 30 min pre-op 04/04/15 1626 04/05/15 1150      Assessment Principal Problem:   S/P laparoscopic splenectomy 04/05/15 (Dr. Hassell Done) for pancytopenia-stable overnight; hemoglobin 7.6 this AM.    LOS: 7 days   Plan: Continue clear liquids today, will advance diet tomorrow if tolerated.  OOB.   Sidney Kann J 04/06/2015

## 2015-04-06 NOTE — Progress Notes (Signed)
PROGRESS NOTE    [name] [name] NKN:[id number] DOB: 04/05/1935 DOA: 03/30/2015 PCP: Jerlyn Ly, MD  Oncologist: Dr. Marin Olp  HPI/Brief narrative 80 year old female with history of HTN, HLD, diet-controlled DM 2, depression, MGUS, breast cancer status post mastectomy 1981, pancytopenia, hospitalized 8/25-03/21/15 for syncope at which time workup showed possible metastatic disease with lung nodules/adrenal nodules/liver lesions, admitted again to [facility] on 03/30/15 with complaints of weakness, poor oral intake, several days difficulty speaking but no unilateral weakness or visual abnormalities. Mild diarrhea. In the ED, pancytopenic and CT head shows multiple low-attenuation areas probably due to old ischemia but acute process or metastatic lesions not excluded. MRI brain _ no acute findings. She remains confused, extremely weak and poor appetite. Oncology consulted > recommend Adrenal lesion Bx - [facility] initially consulted who have asked IR for same.   Assessment/Plan:  Altered MS unclear etiology, mental status unchanged from last several days - MRI brain negative for metastatic disease or acute findings. Oncology cannot rule out paraneoplastic syndrome. PET scan can be performed as outpatient. - No obvious dysarthria or facial asymmetry. Passed bedside swallow screen. -Infectious workup negative. Less likely to be TIA given duration of symptoms.? Related to dehydration. Monitor. - Dr Algis Liming Discussed extensively with daughter [name] on 9/12: did OK for 3 days post recent DC and since then rapid decline with poor PO intake, generalized weakness, inability to walk and confusion. She doesn't think mom has made any improvement since admission. No h/o slurred speech or asymmetric weakness at any time - HIV: neg, RPR: non reactive, TSH 4.4019 and B 12: 1453 - BCx's: neg to date. No acute changes., After surgery  Acute blood loss anemiaWe'll transfuse for hemoglobin less than 7.0  S/P  laparoscopic splenectomy 04/05/15 (Dr. Hassell Done) for pancytopenia-stable overnight; hemoglobin 7.6 this AM. Surgery advance patient to clear liquids today  Pancytopenia/splenomegaly/adrenal nodules When blood cell count has improved, likely secondary to her underlying malignant process Patient CT-guided biopsy of adrenal gland was nondiagnostic - Dr. Marin Olp, requested Dr. Hassell Done from general surgery to proceed with laparoscopic splenectomy and excision of the adrenal nodules . This was performed on 9/16.[name] Patient doing extremely well postoperatively. Cardiology clearance obtained prior to surgery. Patient was found to have a pericardial effusion. No evidence   of tamponade . Negative Pershing Proud 9/15 Continue to taper stress dose IV Solu-Cortef, reduce dose to 50 mg every 12, will probably need some oral hydrocortisone at discharge Outpatient PET scan   Coronary artery disease-risk factors include  advanced age, remote tobacco use, HTN, pre DM. Her EKG shows nonspecific T wave abnormality , moderate pericardial effusion on echo that is unchanged. 2D echo with normal LVF and no RWMAs. Seen by cardiology, she has evidence of coronary artery calcifications on chest CT Myoview on 9/15 was negative.Cardiology has signed off   Dehydration with hyponatremia - Continue IV normal saline hydration, follow labs  MGUS - Oncology working up further  Chronic diastolic CHF - Not on diuretics at home. Compensated.  Diet-controlled DM 2-continue SSI  Hyperlipidemia - Continue statins  Diarrhea Resolved,  Essential hypertension - Controlled with propranolol  Recent hospitalization for syncope, orthostatic hypotension, acute renal failure and abnormal troponins  NAG Resolved    DVT prophylaxis: Subcutaneous heparin  Code Status: Full Family Communication: Discussed extensively with daughter [name] on 04/01/2015. Disposition Plan: Continue stepdown, anticipate transfer to telemetry  tomorrow  Consultants:  Oncology  CCS  IR  Cardiology  Procedures:  None  Antibiotics:  None  Subjective: Stable overnight, complaining of abdominal soreness  Objective: Filed Vitals:   04/06/15 0900 04/06/15 1000 04/06/15 1100 04/06/15 1200  BP:    110/38  Pulse: 57 141 72 76  Temp:      TempSrc:      Resp: 12 14 16 16   Height:      Weight:      SpO2: 96% 84% 98% 96%    Intake/Output Summary (Last 24 hours) at 04/06/15 1221 Last data filed at 04/06/15 1200  Gross per 24 hour  Intake 4128.33 ml  Output   1095 ml  Net 3033.33 ml   Filed Weights   04/05/15 0500 04/05/15 1535 04/06/15 0400  Weight: 61.292 kg (135 lb 2 oz) 66.1 kg (145 lb 11.6 oz) 66 kg (145 lb 8.1 oz)     Exam:  General exam: Pleasant elderly female lying comfortably supine in bed. Chronically ill looking. Dry oral mucosa. Respiratory system: Clear. No increased work of breathing. Cardiovascular system: S1 & S2 heard, RRR. No JVD, murmurs, gallops, clicks or pedal edema.  Gastrointestinal system: Abdomen is nondistended, soft and nontender. Normal bowel sounds heard. Central nervous system: Alert and oriented only to self. No focal neurological deficits. Extremities: Symmetric 5 x 5 power.   Data Reviewed: Basic Metabolic Panel:  Recent Labs Lab 04/01/15 0429 04/01/15 0914 04/02/15 0458 04/04/15 0500 04/05/15 1435 04/06/15 0430  NA 135 134* 132* 140  --  139  K 4.4 4.3 3.7 4.3  --  4.2  CL 104 105 100* 102  --  106  CO2 16* 18* 25 28  --  29  GLUCOSE 77 78 118* 148*  --  160*  BUN 11 10 7 19   --  16  CREATININE 0.84 0.92 0.86 1.14* 1.08* 0.90  CALCIUM 8.7* 9.0 8.5* 9.0  --  7.9*   Liver Function Tests:  Recent Labs Lab 03/30/15 2025 03/31/15 0346 04/04/15 0500 04/06/15 0430  AST 31 26 36 44*  ALT 11* 9* 11* 18  ALKPHOS 116 99 88 74  BILITOT 1.5* 1.1 0.5 0.3  PROT 6.5 5.6* 5.9* 4.5*  ALBUMIN 3.1* 2.7* 2.9* 2.3*   No results for input(s): LIPASE, AMYLASE in the  last 168 hours. No results for input(s): AMMONIA in the last 168 hours. CBC:  Recent Labs Lab 03/30/15 2025  04/02/15 0458 04/03/15 0559 04/04/15 0500 04/05/15 2335 04/06/15 0430  WBC 2.1*  < > 1.3* 1.2* 8.1 8.7 9.1  NEUTROABS 1.6*  --  0.9* 1.0* 7.4  --  7.5  HGB 10.2*  < > 9.5* 10.2* 9.7* 8.4* 7.6*  HCT 29.1*  < > 28.3* 29.3* 29.4* 26.4* 23.8*  MCV 92.7  < > 91.9 92.4 91.9 93.6 93.7  PLT 129*  < > 101* 122* 116* 217 214  < > = values in this interval not displayed. Cardiac Enzymes: No results for input(s): CKTOTAL, CKMB, CKMBINDEX, TROPONINI in the last 168 hours. BNP (last 3 results) No results for input(s): PROBNP in the last 8760 hours. CBG:  Recent Labs Lab 04/01/15 0741 04/02/15 0734 04/03/15 0735 04/04/15 0718 04/05/15 0755  GLUCAP 74 106* 162* 131* 125*    Recent Results (from the past 240 hour(s))  Culture, blood (x 2)     Status: None   Collection Time: 03/31/15  1:30 AM  Result Value Ref Range Status   Specimen Description BLOOD RIGHT ARM  Final   Special Requests BOTTLES DRAWN AEROBIC ONLY 5CC  Final   Culture   Final  NO GROWTH 5 DAYS Performed at [facility]    Report Status 04/05/2015 FINAL  Final  Culture, blood (x 2)     Status: None   Collection Time: 03/31/15  1:50 AM  Result Value Ref Range Status   Specimen Description BLOOD RIGHT HAND  Final   Special Requests BOTTLES DRAWN AEROBIC ONLY 5CC  Final   Culture   Final    NO GROWTH 5 DAYS Performed at [facility]    Report Status 04/05/2015 FINAL  Final  Surgical pcr screen     Status: None   Collection Time: 04/04/15 10:36 PM  Result Value Ref Range Status   MRSA, PCR NEGATIVE NEGATIVE Final   Staphylococcus aureus NEGATIVE NEGATIVE Final    Comment:        The Xpert SA Assay (FDA approved for NASAL specimens in patients over 24 years of age), is one component of a comprehensive surveillance program.  Test performance has been validated by [facility] for  patients greater than or equal to 37 year old. It is not intended to diagnose infection nor to guide or monitor treatment.          Studies: Nm Myocar Multi W/spect W/wall Motion / Ef  04/04/2015    There was no ST segment deviation noted during stress.  The study is normal.  This is a low risk study. No ischemia.  Nuclear stress EF: 76%.   Low risk study. No ischemia identified. Normal EF.         Scheduled Meds: . sodium chloride   Intravenous Once  . heparin subcutaneous  5,000 Units Subcutaneous 3 times per day  . pantoprazole (PROTONIX) IV  40 mg Intravenous QHS   Continuous Infusions: . dextrose 5 % and 0.45 % NaCl with KCl 20 mEq/L 100 mL/hr at 04/06/15 1200    Principal Problem:   S/P laparoscopic splenectomy Active Problems:   MGUS (monoclonal gammopathy of unknown significance)   Adrenal nodule   Chronic diastolic heart failure   Pulmonary nodules/lesions, multiple   DM type 2 (diabetes mellitus, type 2)   Pancytopenia   Generalized weakness   HLD (hyperlipidemia)   Depression   Slurred speech   Difficulty speaking   Protein calorie malnutrition   Diarrhea   Pericardial effusion   Coronary artery calcification seen on CAT scan    Time spent: 30 minutes.    Reyne Dumas, MD,  [facility] Pager [phone]  If 7PM-7AM, please contact night-coverage [email or web address] Password [email or web address] 04/06/2015, 12:21 PM    LOS: 7 days

## 2015-04-06 NOTE — Op Note (Signed)
[name] [name], [name] [name] NO.:  [id number]  MEDICAL RECORD NO.:  [id number]  LOCATION:  3086                         FACILITY:  [facility]  PHYSICIAN:  Isabel Caprice. Hassell Done, MD  DATE OF BIRTH:  [birth year]  DATE OF PROCEDURE:  04/05/2015 DATE OF DISCHARGE:  04/12/2015                              OPERATIVE REPORT   PREOPERATIVE DIAGNOSIS:  Pancytopenia with history of left breast cancer and some necrotic tissue removed from her left adrenal.  PROCEDURE:  Laparoscopic splenectomy for splenomegaly and pancytopenia.  SURGEON:  Isabel Caprice. Hassell Done, MD.  ASSISTANT:  Earnstine Regal, MD.  ANESTHESIA:  General endotracheal.  DESCRIPTION OF PROCEDURE:  This 80 year old white female was taken to room 4 and placed in the leaning spleen position after intubation.  She was prepped with PCMX and draped sterilely.  A time-out was performed. Access to the abdomen was achieved through the left upper quadrant with a 5-mm 0 degree Optiview.  The abdomen was then insufflated and multiple 5-mm trocars were placed, through which I used scissors to take down a bunch of adhesions on the left side.  That gave me a good view of this enormous spleen.  I began by tilting the spleen sort of medially and worked on the lateral aspect.  Using the Harmonic Scalpel, I was able to take down the adhesions of the spleen.  I worked from lateral to medial and then went up and took down the cephalad portion of the spleen.  We actually rotated her sort of laterally so that the spleen fell away and allowed [place] to go along to inspect the hilar region and incised some of the peritoneal reflections to reveal the large vessels.  Once this was done, I then upsized my lower port on the left side to a 12 mm, introduced a Covidien stapler with a round cartridge.  This was a vascular load.  With that 6- cm cartridge, I was able to apply over the lower pole vessels and fired it.  Hemostasis was present.  I then used a  second application at the 6 cm and secured the upper pole vessels.  There was a small area cephalad to this.  I then applied a third staple load and completed the splenectomy disconnection.  The spleen was then moved medially.  Next, I went ahead and began looking in the retroperitoneal region.  We thought we might remove a part of her or all of her adrenal gland if possible. As we began dissecting, we felt this was fairly adherent and I was concerned for the return on the risk.  I thought it would be safer not to do this.  We had previously had a biopsy that showed some necrotic tissue.  We felt that hopefully the splenectomy will give [place] a diagnosis and also improve her quality of life from her pancytopenia.  Because of the normal-sized spleen, we had put into a large 15-mm bag, and I then went ahead and enlarged one of my midline trocar sites to accommodate the extraction of this large plane in the bag.  There was no spillage. That wound was then closed with interrupted #1 Novafil.  We inspected,  we aspirated, we removed the blood clots.  Everything appeared to be hemostatic and looked good.  The port sites were all injected with Exparel and closed with a stapler.  The patient tolerated the procedure well and was taken to recovery room in satisfactory condition.     Isabel Caprice Hassell Done, MD     MBM/MEDQ  D:  04/05/2015  T:  04/06/2015  Job:  [id number]  cc:   Volanda Napoleon, M.D. Fax: [phone]

## 2015-04-06 NOTE — Op Note (Deleted)
NAMEMarland [name]  [name], [name]:  [id number]  MEDICAL RECORD NO.:  [id number]  LOCATION:  NUC                          FACILITY:  [name]  PHYSICIAN:  Isabel Caprice. Hassell Done, MD  DATE OF BIRTH:  1934/12/12  DATE OF PROCEDURE:  04/05/2015 DATE OF DISCHARGE:                              OPERATIVE REPORT   PREOPERATIVE DIAGNOSIS:  Pancytopenia with history of left breast cancer and some necrotic tissue removed from her left adrenal.  PROCEDURE:  Laparoscopic splenectomy for splenomegaly and pancytopenia.  SURGEON:  Isabel Caprice. Hassell Done, MD.  ASSISTANT:  Earnstine Regal, MD.  ANESTHESIA:  General endotracheal.  DESCRIPTION OF PROCEDURE:  This 80 year old white female was taken to room 4 and placed in the leaning spleen position after intubation.  She was prepped with PCMX and draped sterilely.  A time-out was performed. Access to the abdomen was achieved through the left upper quadrant with a 5-mm 0 degree Optiview.  The abdomen was then insufflated and multiple 5-mm trocars were placed, through which I used scissors to take down a bunch of adhesions on the left side.  That gave me a good view of this enormous spleen.  I began by tilting the spleen sort of medially and worked on the lateral aspect.  Using the Harmonic Scalpel, I was able to take down the adhesions of the spleen.  I worked from lateral to medial and then went up and took down the cephalad portion of the spleen.  We actually rotated her sort of laterally so __________ away and went along to inspect the hilar region and incised some of the peritoneal reflections to reveal the large vessels.  Once this was done, I then upsized my lower port on the left side to a 12 mm, introduced a Covidien stapler with a round cartridge.  This was a vascular load.  With that 6- cm cartridge, I was able to apply over the lower pole vessels and fired it.  Hemostasis was present.  I then used a second application at the 6 cm and  secured the upper pole vessels.  There was a small area cephalad to this.  I then applied a third staple load and completed the splenectomy disconnection.  The spleen was then moved medially.  Next, I went ahead and began looking in the retroperitoneal region.  We thought we might remove a part of her or all of her adrenal gland if possible. As we began dissecting, we felt this was fairly adherent and I was concerned for the return on the risk.  I thought it would be safer not to do this.  We had previously had a biopsy that showed some necrotic tissue.  We felt that hopefully the splenectomy will give [place] a diagnosis and also improve her quality of life from her pancytopenia.  Because of the normal-sized spleen, we had put into a large 15-mm bag, and I then went ahead and enlarged one of my midline trocar sites to accommodate the extraction of this large plane in the bag.  There was no spillage. That wound was then closed with interrupted #1 Novafil.  We inspected, we aspirated, we removed the blood clots.  Everything appeared to be hemostatic and looked good.  The port sites were all injected with Exparel and closed with a stapler.  The patient tolerated the procedure well and was taken to recovery room in satisfactory condition.     Isabel Caprice Hassell Done, MD     MBM/MEDQ  D:  04/05/2015  T:  04/06/2015  Job:  [id number]  cc:   Volanda Napoleon, M.D. Fax: [phone]

## 2015-04-07 LAB — CBC
HCT: 23.9 % — ABNORMAL LOW (ref 36.0–46.0)
HEMOGLOBIN: 8.5 g/dL — AB (ref 12.0–15.0)
MCH: 36.2 pg — ABNORMAL HIGH (ref 26.0–34.0)
MCHC: 35.6 g/dL (ref 30.0–36.0)
MCV: 101.7 fL — ABNORMAL HIGH (ref 78.0–100.0)
PLATELETS: 318 10*3/uL (ref 150–400)
RBC: 2.35 MIL/uL — ABNORMAL LOW (ref 3.87–5.11)
RDW: 15.5 % (ref 11.5–15.5)
WBC: 17 10*3/uL — ABNORMAL HIGH (ref 4.0–10.5)

## 2015-04-07 LAB — COMPREHENSIVE METABOLIC PANEL
ALBUMIN: 2.5 g/dL — AB (ref 3.5–5.0)
ALK PHOS: 81 U/L (ref 38–126)
ALT: 16 U/L (ref 14–54)
ANION GAP: 4 — AB (ref 5–15)
AST: 38 U/L (ref 15–41)
BILIRUBIN TOTAL: 0.4 mg/dL (ref 0.3–1.2)
BUN: 12 mg/dL (ref 6–20)
CALCIUM: 8.3 mg/dL — AB (ref 8.9–10.3)
CO2: 29 mmol/L (ref 22–32)
CREATININE: 0.85 mg/dL (ref 0.44–1.00)
Chloride: 107 mmol/L (ref 101–111)
GFR calc non Af Amer: >60 mL/min (ref >60)
GLUCOSE: 132 mg/dL — AB (ref 65–99)
Potassium: 4.3 mmol/L (ref 3.5–5.1)
Sodium: 140 mmol/L (ref 135–145)
TOTAL PROTEIN: 4.9 g/dL — AB (ref 6.5–8.1)

## 2015-04-07 NOTE — Progress Notes (Signed)
upon assessing the L lower lateral abdominal incision dressing before patient went to bed, noted the dressing was saturated with bright red blood. Upon palpation bloody drainage "oozed" from site. VSS and Dr. Zella Richer notified of findings. Pressure dressing applied, ice applied to site. New orders placed. Will continue to monitor.

## 2015-04-07 NOTE — Progress Notes (Signed)
Called to pt room by NT who stated her drsg was saturated with blood.  Upon assessment drsg to l. Lateral incision site was saturated with bright red blood and upon palpation blood drainage "oozed" from site.  VS stable (see flowsheet) and Dr. Zella Richer notified of assessment findings.  4X4, abd pad, and tape applied to form a pressure drsg. Will continue to monitor. [name] [name]

## 2015-04-07 NOTE — Progress Notes (Signed)
Patient transferred from ICU, report received from Luis Lopez, [place]. Patient stable and placed on tele no changes from initial PM assessment. Will continue to monitor.

## 2015-04-07 NOTE — Progress Notes (Signed)
Report called to [facility] on 4W. Will transport patient from 1237 to 1444.

## 2015-04-07 NOTE — Progress Notes (Addendum)
PROGRESS NOTE    [name] [name] DXA:[id number] DOB: [birth year] DOA: 03/30/2015 PCP: Jerlyn Ly, MD  Oncologist: Dr. Marin Olp  HPI/Brief narrative 80 year old female with history of HTN, HLD, diet-controlled DM 2, depression, MGUS, breast cancer status post mastectomy 1981, pancytopenia, hospitalized 8/25-03/21/15 for syncope at which time workup showed possible metastatic disease with lung nodules/adrenal nodules/liver lesions, admitted again to [facility] on 03/30/15 with complaints of weakness, poor oral intake, several days difficulty speaking but no unilateral weakness or visual abnormalities. Mild diarrhea. In the ED, pancytopenic and CT head shows multiple low-attenuation areas probably due to old ischemia but acute process or metastatic lesions not excluded. MRI brain _ no acute findings. She remains confused, extremely weak and poor appetite. Oncology consulted > recommend Adrenal lesion Bx - CCS initially consulted who have asked IR for same.   Assessment/Plan:  Altered MS unclear etiology, mental status unchanged, likely this is patient's new baseline - Patient remembers events from yesterday and remembers having surgery. MRI brain negative for metastatic disease or acute findings. Oncology cannot rule out paraneoplastic syndrome. PET scan can be performed as outpatient. -Infectious workup negative. Less likely to be TIA given duration of symptoms.? Related to dehydration. No gross focal neurologic deficits otherwise - HIV: neg, RPR: non reactive, TSH 4.4019 and B 12: 1453 - BCx's: neg to date. Neurologically stable postoperatively  Acute blood loss anemiaWe'll transfuse for hemoglobin less than 7.0. Hemoglobin fortunately 8.5 today  S/P laparoscopic splenectomy 04/05/15 (Dr. Hassell Done) for pancytopenia-stable overnight; hemoglobin 8.5 this AM. Advance to full liquid diet, patient now has postsplenectomy leukocytosis   Post splenectomy leukocytosis/splenomegaly/status  postsplenectomy/adrenal nodules Postsplenectomy leukocytosis, await further recommendations from oncology. CT-guided biopsy of adrenal gland was nondiagnostic, therefore patient underwent splenectomy on 9/16.On stress dose steroids, Reduce Solu-Cortef to 25 IV every 12 today, will probably need some oral hydrocortisone at discharge Outpatient PET scan   Coronary artery disease/pericardial effusion-risk factors include  advanced age, remote tobacco use, HTN, pre DM. Her EKG shows nonspecific T wave abnormality , moderate pericardial effusion on echo that is unchanged. 2D echo with normal LVF and no RWMAs. Seen by cardiology, she has evidence of coronary artery calcifications on chest CT. Myoview on 9/15 was negative.no evidence of pericardial tamponade. Cardiology has signed off   Dehydration with hyponatremia - Resolved,  MGUS - Oncology working up further  Chronic diastolic CHF - Not on diuretics at home. Compensated.  Diet-controlled DM 2-continue SSI  Hyperlipidemia - Continue statins  Diarrhea Resolved,  Essential hypertension - Controlled with propranolol  Recent hospitalization for syncope, orthostatic hypotension, acute renal failure and abnormal troponins  NAG Resolved    DVT prophylaxis: Subcutaneous heparin  Code Status: Full Family Communication: Discussed extensively with daughter [name] on 04/01/2015. Disposition Plan: Anticipate discharge in one to 2 days   Consultants:  Oncology  CCS  IR  Cardiology  Procedures:  None  Antibiotics:  None   Subjective: Patient advance to full liquid diet by surgery, doing extremely well postoperatively  Objective: Filed Vitals:   04/07/15 0400 04/07/15 0619 04/07/15 0823 04/07/15 0848  BP:  125/50 117/52   Pulse:  76 75 73  Temp: 97.9 F (36.6 C) 98.2 F (36.8 C) 97.7 F (36.5 C)   TempSrc: Oral Oral Oral   Resp:  16 16   Height:  5\' 8"  (1.727 m)    Weight:  66.225 kg (146 lb)    SpO2:   99% 98%     Intake/Output Summary (Last 24 hours)  at 04/07/15 1152 Last data filed at 04/07/15 1146  Gross per 24 hour  Intake   2340 ml  Output   2025 ml  Net    315 ml   Filed Weights   04/05/15 1535 04/06/15 0400 04/07/15 0619  Weight: 66.1 kg (145 lb 11.6 oz) 66 kg (145 lb 8.1 oz) 66.225 kg (146 lb)     Exam:  General exam: Pleasant elderly female lying comfortably supine in bed. Chronically ill looking. Dry oral mucosa. Respiratory system: Clear. No increased work of breathing. Cardiovascular system: S1 & S2 heard, RRR. No JVD, murmurs, gallops, clicks or pedal edema.  Gastrointestinal system: Abdomen is nondistended, soft and nontender. Normal bowel sounds heard. Central nervous system: Alert and oriented only to self. No focal neurological deficits. Extremities: Symmetric 5 x 5 power.   Data Reviewed: Basic Metabolic Panel:  Recent Labs Lab 04/01/15 0914 04/02/15 0458 04/04/15 0500 04/05/15 1435 04/06/15 0430 04/07/15 0835  NA 134* 132* 140  --  139 140  K 4.3 3.7 4.3  --  4.2 4.3  CL 105 100* 102  --  106 107  CO2 18* 25 28  --  29 29  GLUCOSE 78 118* 148*  --  160* 132*  BUN 10 7 19   --  16 12  CREATININE 0.92 0.86 1.14* 1.08* 0.90 0.85  CALCIUM 9.0 8.5* 9.0  --  7.9* 8.3*   Liver Function Tests:  Recent Labs Lab 04/04/15 0500 04/06/15 0430 04/07/15 0835  AST 36 44* 38  ALT 11* 18 16  ALKPHOS 88 74 81  BILITOT 0.5 0.3 0.4  PROT 5.9* 4.5* 4.9*  ALBUMIN 2.9* 2.3* 2.5*   No results for input(s): LIPASE, AMYLASE in the last 168 hours. No results for input(s): AMMONIA in the last 168 hours. CBC:  Recent Labs Lab 04/02/15 0458 04/03/15 0559 04/04/15 0500 04/05/15 2335 04/06/15 0430 04/07/15 0835  WBC 1.3* 1.2* 8.1 8.7 9.1 17.0*  NEUTROABS 0.9* 1.0* 7.4  --  7.5  --   HGB 9.5* 10.2* 9.7* 8.4* 7.6* 8.5*  HCT 28.3* 29.3* 29.4* 26.4* 23.8* 23.9*  MCV 91.9 92.4 91.9 93.6 93.7 101.7*  PLT 101* 122* 116* 217 214 318   Cardiac Enzymes: No  results for input(s): CKTOTAL, CKMB, CKMBINDEX, TROPONINI in the last 168 hours. BNP (last 3 results) No results for input(s): PROBNP in the last 8760 hours. CBG:  Recent Labs Lab 04/01/15 0741 04/02/15 0734 04/03/15 0735 04/04/15 0718 04/05/15 0755  GLUCAP 74 106* 162* 131* 125*    Recent Results (from the past 240 hour(s))  Culture, blood (x 2)     Status: None   Collection Time: 03/31/15  1:30 AM  Result Value Ref Range Status   Specimen Description BLOOD RIGHT ARM  Final   Special Requests BOTTLES DRAWN AEROBIC ONLY 5CC  Final   Culture   Final    NO GROWTH 5 DAYS Performed at [facility]    Report Status 04/05/2015 FINAL  Final  Culture, blood (x 2)     Status: None   Collection Time: 03/31/15  1:50 AM  Result Value Ref Range Status   Specimen Description BLOOD RIGHT HAND  Final   Special Requests BOTTLES DRAWN AEROBIC ONLY 5CC  Final   Culture   Final    NO GROWTH 5 DAYS Performed at [facility]    Report Status 04/05/2015 FINAL  Final  Surgical pcr screen     Status: None   Collection Time:  04/04/15 10:36 PM  Result Value Ref Range Status   MRSA, PCR NEGATIVE NEGATIVE Final   Staphylococcus aureus NEGATIVE NEGATIVE Final    Comment:        The Xpert SA Assay (FDA approved for NASAL specimens in patients over 95 years of age), is one component of a comprehensive surveillance program.  Test performance has been validated by [facility] for patients greater than or equal to 31 year old. It is not intended to diagnose infection nor to guide or monitor treatment.          Studies: No results found.      Scheduled Meds: . sodium chloride   Intravenous Once  . heparin subcutaneous  5,000 Units Subcutaneous 3 times per day  . hydrocortisone sod succinate (SOLU-CORTEF) inj  50 mg Intravenous Q12H  . pantoprazole (PROTONIX) IV  40 mg Intravenous QHS   Continuous Infusions: . dextrose 5 % and 0.45 % NaCl with KCl 20 mEq/L 100 mL/hr  at 04/07/15 4503    Principal Problem:   S/P laparoscopic splenectomy Active Problems:   MGUS (monoclonal gammopathy of unknown significance)   Adrenal nodule   Chronic diastolic heart failure   Pulmonary nodules/lesions, multiple   DM type 2 (diabetes mellitus, type 2)   Pancytopenia   Generalized weakness   HLD (hyperlipidemia)   Depression   Slurred speech   Difficulty speaking   Protein calorie malnutrition   Diarrhea   Pericardial effusion   Coronary artery calcification seen on CAT scan    Time spent: 30 minutes.    Reyne Dumas, MD,  [facility] Pager [phone]  If 7PM-7AM, please contact night-coverage [email or web address] Password [email or web address] 04/07/2015, 11:52 AM    LOS: 8 days

## 2015-04-07 NOTE — Care Management Note (Signed)
Case Management Note  Patient Details  Name: [name] [name] MRN: [id number] Date of Birth: 1934-09-03  Subjective/Objective:        AMS            Action/Plan: SNF vs Home Health. Pt is active with [facility] for [facility] RN/PT. Resumption of care orders needed at time of dc if going home.  Waiting final recommendations.    Expected Discharge Date:                  Expected Discharge Plan:  [place]  In-House Referral:     Discharge planning Services  CM Consult  Post Acute Care Choice:  Home Health, Resumption of Svcs/PTA Provider Choice offered to:  Patient  DME Arranged:    DME Agency:     HH Arranged:  RN, PT [place] Agency:  [place]  Status of Service:  In process, will continue to follow  Medicare Important Message Given:  Yes-third notification given Date Medicare IM Given:    Medicare IM give by:    Date Additional Medicare IM Given:    Additional Medicare Important Message give by:     If discussed at [place] of Stay Meetings, dates discussed:    Additional Comments:  Erenest Rasher, RN 04/07/2015, 2:05 PM

## 2015-04-07 NOTE — Progress Notes (Signed)
2 Days Post-Op  Subjective: No significant pain.  Tolerating clear liquids.  No BM or flatus Objective: Vital signs in last 24 hours: Temp:  [97.4 F (36.3 C)-98.2 F (36.8 C)] 97.7 F (36.5 C) (09/18 0823) Pulse Rate:  [58-78] 73 (09/18 0848) Resp:  [12-19] 16 (09/18 0823) BP: (101-125)/(28-52) 117/52 mmHg (09/18 0823) SpO2:  [92 %-99 %] 98 % (09/18 0823) Weight:  [66.225 kg (146 lb)] 66.225 kg (146 lb) (09/18 0619) Last BM Date:  (pt unsure but thinks prior to surgery)  Intake/Output from previous day: 09/17 0701 - 09/18 0700 In: 2880 [P.O.:480; I.V.:2400] Out: 2020 [Urine:2020] Intake/Output this shift:    PE: General- In NAD Abdomen-soft, dressings dry, hypoactive bowel sounds  Lab Results:   Recent Labs  04/06/15 0430 04/07/15 0835  WBC 9.1 17.0*  HGB 7.6* 8.5*  HCT 23.8* 23.9*  PLT 214 318   BMET  Recent Labs  04/06/15 0430 04/07/15 0835  NA 139 140  K 4.2 4.3  CL 106 107  CO2 29 29  GLUCOSE 160* 132*  BUN 16 12  CREATININE 0.90 0.85  CALCIUM 7.9* 8.3*   PT/INR No results for input(s): LABPROT, INR in the last 72 hours. Comprehensive Metabolic Panel:    Component Value Date/Time   NA 140 04/07/2015 0835   NA 139 04/06/2015 0430   NA 137 10/08/2014 1321   K 4.3 04/07/2015 0835   K 4.2 04/06/2015 0430   K 3.3 10/08/2014 1321   CL 107 04/07/2015 0835   CL 106 04/06/2015 0430   CL 98 10/08/2014 1321   CO2 29 04/07/2015 0835   CO2 29 04/06/2015 0430   CO2 31 10/08/2014 1321   BUN 12 04/07/2015 0835   BUN 16 04/06/2015 0430   BUN 5* 10/08/2014 1321   CREATININE 0.85 04/07/2015 0835   CREATININE 0.90 04/06/2015 0430   CREATININE 1.0 10/08/2014 1321   GLUCOSE 132* 04/07/2015 0835   GLUCOSE 160* 04/06/2015 0430   GLUCOSE 98 10/08/2014 1321   CALCIUM 8.3* 04/07/2015 0835   CALCIUM 7.9* 04/06/2015 0430   CALCIUM 9.4 10/08/2014 1321   AST 38 04/07/2015 0835   AST 44* 04/06/2015 0430   AST 29 10/08/2014 1321   ALT 16 04/07/2015 0835   ALT  18 04/06/2015 0430   ALT 15 10/08/2014 1321   ALKPHOS 81 04/07/2015 0835   ALKPHOS 74 04/06/2015 0430   ALKPHOS 71 10/08/2014 1321   BILITOT 0.4 04/07/2015 0835   BILITOT 0.3 04/06/2015 0430   BILITOT 0.80 10/08/2014 1321   PROT 4.9* 04/07/2015 0835   PROT 4.5* 04/06/2015 0430   PROT 6.9 10/08/2014 1321   ALBUMIN 2.5* 04/07/2015 0835   ALBUMIN 2.3* 04/06/2015 0430     Studies/Results: No results found.  Anti-infectives: Anti-infectives    Start     Dose/Rate Route Frequency Ordered Stop   04/05/15 2000  ceFAZolin (ANCEF) IVPB 2 g/50 mL premix     2 g 100 mL/hr over 30 Minutes Intravenous 3 times per day 04/05/15 1537 04/05/15 2056   04/05/15 0600  cefOXitin (MEFOXIN) 2 g in dextrose 5 % 50 mL IVPB    Comments:  Pharmacy may adjust dosing strength, interval, or rate of medication as needed for optimal therapy for the patient Send with patient on call to the OR.  Anesthesia to complete antibiotic administration <72min prior to incision per [facility].   2 g 100 mL/hr over 30 Minutes Intravenous 30 min pre-op 04/04/15 1626 04/05/15 1150  Assessment Principal Problem:   S/P laparoscopic splenectomy 04/05/15 (Dr. Hassell Done) for pancytopenia-hemoglobin back up; has post splenectomy leukocytosis    LOS: 8 days   Plan: Ambulate.  Advance to full liquids.  ROSENBOWER,TODD J 04/07/2015

## 2015-04-08 LAB — CBC
HCT: 26.1 % — ABNORMAL LOW (ref 36.0–46.0)
HEMATOCRIT: 26.3 % — AB (ref 36.0–46.0)
Hemoglobin: 8 g/dL — ABNORMAL LOW (ref 12.0–15.0)
Hemoglobin: 8.2 g/dL — ABNORMAL LOW (ref 12.0–15.0)
MCH: 29.3 pg (ref 26.0–34.0)
MCH: 30.4 pg (ref 26.0–34.0)
MCHC: 30.4 g/dL (ref 30.0–36.0)
MCHC: 31.4 g/dL (ref 30.0–36.0)
MCV: 96.3 fL (ref 78.0–100.0)
MCV: 96.7 fL (ref 78.0–100.0)
PLATELETS: 398 10*3/uL (ref 150–400)
PLATELETS: 373 10*3/uL (ref 150–400)
RBC: 2.7 MIL/uL — ABNORMAL LOW (ref 3.87–5.11)
RBC: 2.73 MIL/uL — ABNORMAL LOW (ref 3.87–5.11)
RDW: 15.5 % (ref 11.5–15.5)
RDW: 15.4 % (ref 11.5–15.5)
WBC: 16.1 10*3/uL — ABNORMAL HIGH (ref 4.0–10.5)
WBC: 16.3 10*3/uL — AB (ref 4.0–10.5)

## 2015-04-08 LAB — GLUCOSE, CAPILLARY: GLUCOSE-CAPILLARY: 129 mg/dL — AB (ref 65–99)

## 2015-04-08 MED ORDER — POLYETHYLENE GLYCOL 3350 17 G PO PACK
17.0000 g | PACK | Freq: Every day | ORAL | Status: DC
  Administered 2015-04-08 – 2015-04-09 (×2): 17 g via ORAL
  Filled 2015-04-08 (×5): qty 1

## 2015-04-08 MED ORDER — LIDOCAINE-EPINEPHRINE (PF) 1 %-1:200000 IJ SOLN
30.0000 mL | Freq: Once | INTRAMUSCULAR | Status: AC
  Administered 2015-04-08: 30 mL
  Filled 2015-04-08: qty 30

## 2015-04-08 MED ORDER — HYDROCORTISONE NA SUCCINATE PF 100 MG IJ SOLR
25.0000 mg | Freq: Two times a day (BID) | INTRAMUSCULAR | Status: DC
  Administered 2015-04-08 – 2015-04-09 (×2): 25 mg via INTRAVENOUS
  Filled 2015-04-08 (×2): qty 2

## 2015-04-08 NOTE — Progress Notes (Signed)
[name] [name]  Is doing okay. She underwent laparoscopic splenectomy on Friday. I'm not sure if one of the adrenal nodule was removed also.  She had little bit of bleeding yesterday.  Her platelet count and white cell count certainly have come up nicely.er platelet count is point thousand. Her white cell count 16.1. She is little bit anemic. I probably would get her on folic acid.I will check iron studies on her.  There is no past back yet.  She is out of bed. She is having some clear liquids.  On her physical exam, her vital signs are stable. She is afebrile. She has  The abdominal wound dressed. There is no abdominal distention. Bowel sounds are decreased. Lungs are clear. Cardiac exam regular rate and rhythm. Extremities shows no clubbing, cyanosis or edema.  I probably would switch over to oral hydrocortisone.  Hopefully this can be tapered off.   Again, the key  Is going to be pathology.  She continues have a very strong faith. We will continue to pray hard for her.  [name].  Rodman Key 7:7

## 2015-04-08 NOTE — Progress Notes (Signed)
PROGRESS NOTE    [name] [name] ZOX:[id number] DOB: 08/30/1934 DOA: 03/30/2015 PCP: Jerlyn Ly, MD  Oncologist: Dr. Marin Olp  HPI/Brief narrative 80 year old female with history of HTN, HLD, diet-controlled DM 2, depression, MGUS, breast cancer status post mastectomy 1981, pancytopenia, hospitalized 8/25-03/21/15 for syncope at which time workup showed possible metastatic disease with lung nodules/adrenal nodules/liver lesions, admitted again to [facility] on 03/30/15 with complaints of weakness, poor oral intake, several days difficulty speaking but no unilateral weakness or visual abnormalities. Mild diarrhea. In the ED, pancytopenic and CT head shows multiple low-attenuation areas probably due to old ischemia but acute process or metastatic lesions not excluded. MRI brain _ no acute findings. She remains confused, extremely weak and poor appetite. Oncology consulted > recommend Adrenal lesion Bx - CCS initially consulted who have asked IR for same.  Subjective-patient states that she had some bleeding from the left upper quadrant of the surgical site, constipated  Assessment/Plan:  Altered MS unclear etiology, mental status unchanged, likely this is patient's new baseline - Patient remembers events from yesterday and remembers having surgery. MRI brain negative for metastatic disease or acute findings. Oncology cannot rule out paraneoplastic syndrome. PET scan can be performed as outpatient. -Infectious workup negative. Less likely to be TIA given duration of symptoms.? Related to dehydration. No gross focal neurologic deficits otherwise - HIV: neg, RPR: non reactive, TSH 4.4019 and B 12: 1453 - BCx's: neg to date. Neurologically stable postoperatively  Acute blood loss anemiaWe'll transfuse for hemoglobin less than 7.0. Hemoglobin 8.0  S/P laparoscopic splenectomy 04/05/15 by (Dr. Hassell Done) for doing well postoperatively, still constipated. Surgery advancing the patient's diet.  patient now has postsplenectomy leukocytosis Diet advance to a full liquid diet  Constipation-mobilized, PT eval, start patient on Miralax  Post splenectomy leukocytosis/splenomegaly/status postsplenectomy/adrenal nodules Postsplenectomy leukocytosis, await further recommendations from oncology. CT-guided biopsy of adrenal gland was nondiagnostic, therefore patient underwent splenectomy on 9/16.On stress dose steroids, Reduce Solu-Cortef to 25 IV every 12 today, will switch to hydrocortisone 20 mg in the a.m. 10 mg in the p.m. Outpatient PET scan   Coronary artery disease/pericardial effusion-stable, risk factors include  advanced age, remote tobacco use, HTN, pre DM. Her EKG shows nonspecific T wave abnormality , moderate pericardial effusion on echo that is unchanged. 2D echo with normal LVF and no RWMAs. Seen by cardiology, she has evidence of coronary artery calcifications on chest CT. she underwent Myoview on 9/15 which was negative.no evidence of pericardial tamponade. Cardiology has signed off   Dehydration with hyponatremia - Resolved,  MGUS - Oncology working up further  Chronic diastolic CHF - Not on diuretics at home. Compensated.  Diet-controlled DM 2-continue SSI  Hyperlipidemia - Continue statins  Diarrhea Resolved,  Essential hypertension Stable    DVT prophylaxis: Subcutaneous heparin  Code Status: Full Family Communication: Discussed extensively with daughter [name] on 04/01/2015. Disposition Plan: Anticipate discharge in one to 2 days   Consultants:  Oncology  CCS  IR  Cardiology  Procedures:  None  Antibiotics:  None      Objective: Filed Vitals:   04/07/15 1518 04/07/15 1845 04/07/15 2221 04/08/15 0532  BP: 116/43 132/51 125/52 138/51  Pulse: 73 85 85 80  Temp: 98.5 F (36.9 C)  98.6 F (37 C) 98.3 F (36.8 C)  TempSrc: Oral  Oral Oral  Resp: 16 18 20 18   Height:      Weight:      SpO2: 98% 98% 99% 97%     Intake/Output Summary (  Last 24 hours) at 04/08/15 1012 Last data filed at 04/08/15 0814  Gross per 24 hour  Intake   3120 ml  Output    275 ml  Net   2845 ml   Filed Weights   04/05/15 1535 04/06/15 0400 04/07/15 0619  Weight: 66.1 kg (145 lb 11.6 oz) 66 kg (145 lb 8.1 oz) 66.225 kg (146 lb)     Exam:  General exam: Pleasant elderly female lying comfortably supine in bed. Chronically ill looking. Dry oral mucosa. Respiratory system: Clear. No increased work of breathing. Cardiovascular system: S1 & S2 heard, RRR. No JVD, murmurs, gallops, clicks or pedal edema.  Gastrointestinal system: Abdomen is nondistended, soft and nontender. Normal bowel sounds heard. Central nervous system: Alert and oriented only to self. No focal neurological deficits. Extremities: Symmetric 5 x 5 power.   Data Reviewed: Basic Metabolic Panel:  Recent Labs Lab 04/02/15 0458 04/04/15 0500 04/05/15 1435 04/06/15 0430 04/07/15 0835  NA 132* 140  --  139 140  K 3.7 4.3  --  4.2 4.3  CL 100* 102  --  106 107  CO2 25 28  --  29 29  GLUCOSE 118* 148*  --  160* 132*  BUN 7 19  --  16 12  CREATININE 0.86 1.14* 1.08* 0.90 0.85  CALCIUM 8.5* 9.0  --  7.9* 8.3*   Liver Function Tests:  Recent Labs Lab 04/04/15 0500 04/06/15 0430 04/07/15 0835  AST 36 44* 38  ALT 11* 18 16  ALKPHOS 88 74 81  BILITOT 0.5 0.3 0.4  PROT 5.9* 4.5* 4.9*  ALBUMIN 2.9* 2.3* 2.5*   No results for input(s): LIPASE, AMYLASE in the last 168 hours. No results for input(s): AMMONIA in the last 168 hours. CBC:  Recent Labs Lab 04/02/15 0458 04/03/15 0559 04/04/15 0500 04/05/15 2335 04/06/15 0430 04/07/15 0835 04/07/15 2348 04/08/15 0521  WBC 1.3* 1.2* 8.1 8.7 9.1 17.0* 16.3* 16.1*  NEUTROABS 0.9* 1.0* 7.4  --  7.5  --   --   --   HGB 9.5* 10.2* 9.7* 8.4* 7.6* 8.5* 8.2* 8.0*  HCT 28.3* 29.3* 29.4* 26.4* 23.8* 23.9* 26.1* 26.3*  MCV 91.9 92.4 91.9 93.6 93.7 101.7* 96.7 96.3  PLT 101* 122* 116* 217 214 318  373 398   Cardiac Enzymes: No results for input(s): CKTOTAL, CKMB, CKMBINDEX, TROPONINI in the last 168 hours. BNP (last 3 results) No results for input(s): PROBNP in the last 8760 hours. CBG:  Recent Labs Lab 04/02/15 0734 04/03/15 0735 04/04/15 0718 04/05/15 0755  GLUCAP 106* 162* 131* 125*    Recent Results (from the past 240 hour(s))  Culture, blood (x 2)     Status: None   Collection Time: 03/31/15  1:30 AM  Result Value Ref Range Status   Specimen Description BLOOD RIGHT ARM  Final   Special Requests BOTTLES DRAWN AEROBIC ONLY 5CC  Final   Culture   Final    NO GROWTH 5 DAYS Performed at [facility]    Report Status 04/05/2015 FINAL  Final  Culture, blood (x 2)     Status: None   Collection Time: 03/31/15  1:50 AM  Result Value Ref Range Status   Specimen Description BLOOD RIGHT HAND  Final   Special Requests BOTTLES DRAWN AEROBIC ONLY 5CC  Final   Culture   Final    NO GROWTH 5 DAYS Performed at [facility]    Report Status 04/05/2015 FINAL  Final  Surgical pcr screen  Status: None   Collection Time: 04/04/15 10:36 PM  Result Value Ref Range Status   MRSA, PCR NEGATIVE NEGATIVE Final   Staphylococcus aureus NEGATIVE NEGATIVE Final    Comment:        The Xpert SA Assay (FDA approved for NASAL specimens in patients over 30 years of age), is one component of a comprehensive surveillance program.  Test performance has been validated by [facility] for patients greater than or equal to 7 year old. It is not intended to diagnose infection nor to guide or monitor treatment.          Studies: No results found.      Scheduled Meds: . sodium chloride   Intravenous Once  . heparin subcutaneous  5,000 Units Subcutaneous 3 times per day  . hydrocortisone sod succinate (SOLU-CORTEF) inj  25 mg Intravenous Q12H  . pantoprazole (PROTONIX) IV  40 mg Intravenous QHS  . polyethylene glycol  17 g Oral Daily   Continuous Infusions: .  dextrose 5 % and 0.45 % NaCl with KCl 20 mEq/L 100 mL/hr at 04/08/15 0532    Principal Problem:   S/P laparoscopic splenectomy Active Problems:   MGUS (monoclonal gammopathy of unknown significance)   Adrenal nodule   Chronic diastolic heart failure   Pulmonary nodules/lesions, multiple   DM type 2 (diabetes mellitus, type 2)   Pancytopenia   Generalized weakness   HLD (hyperlipidemia)   Depression   Slurred speech   Difficulty speaking   Protein calorie malnutrition   Diarrhea   Pericardial effusion   Coronary artery calcification seen on CAT scan    Time spent: 30 minutes.    Reyne Dumas, MD,  [facility] Pager [phone]  If 7PM-7AM, please contact night-coverage [email or web address] Password [email or web address] 04/08/2015, 10:12 AM    LOS: 9 days

## 2015-04-08 NOTE — Progress Notes (Signed)
3 Days Post-Op  Subjective: She looks great.  She had some bleeding last PM from the left lateral side.  What I saw looked like serosanguinous fluid on the dressing.  She is taking full liquids, having flatus, no BM so far.  Has not walked and I don't see an IS.  Other port sites all look great.  Objective: Vital signs in last 24 hours: Temp:  [98.3 F (36.8 C)-98.6 F (37 C)] 98.3 F (36.8 C) (09/19 0532) Pulse Rate:  [73-85] 80 (09/19 0532) Resp:  [16-20] 18 (09/19 0532) BP: (116-138)/(43-52) 138/51 mmHg (09/19 0532) SpO2:  [97 %-99 %] 97 % (09/19 0532) Last BM Date:  (patient stated "before surgery") Diet:  Full liquids 480 Po recorded Voided x 5 yesterday, no volume recorded Afebrile, VSS BMP OK yesterday WBC has gone from 1.2 on 9/13 to 16.1 yesterday Platelets on 04/01/15 101K now up to 398K yesterday No labs this AM  Intake/Output from previous day: 09/18 0701 - 09/19 0700 In: 2880 [P.O.:480; I.V.:2400] Out: 275 [Urine:275] Intake/Output this shift: Total I/O In: 240 [P.O.:240] Out: -   General appearance: alert, cooperative and no distress GI: soft, still sore, but all the sites look good, she has BS and flatus, no distension.  Lab Results:   Recent Labs  04/07/15 2348 04/08/15 0521  WBC 16.3* 16.1*  HGB 8.2* 8.0*  HCT 26.1* 26.3*  PLT 373 398    BMET  Recent Labs  04/06/15 0430 04/07/15 0835  NA 139 140  K 4.2 4.3  CL 106 107  CO2 29 29  GLUCOSE 160* 132*  BUN 16 12  CREATININE 0.90 0.85  CALCIUM 7.9* 8.3*   PT/INR No results for input(s): LABPROT, INR in the last 72 hours.   Recent Labs Lab 04/04/15 0500 04/06/15 0430 04/07/15 0835  AST 36 44* 38  ALT 11* 18 16  ALKPHOS 88 74 81  BILITOT 0.5 0.3 0.4  PROT 5.9* 4.5* 4.9*  ALBUMIN 2.9* 2.3* 2.5*     Lipase     Component Value Date/Time   LIPASE 54* 03/14/2015 1905     Studies/Results: No results found.  Medications: . sodium chloride   Intravenous Once  . heparin  subcutaneous  5,000 Units Subcutaneous 3 times per day  . hydrocortisone sod succinate (SOLU-CORTEF) inj  25 mg Intravenous Q12H  . pantoprazole (PROTONIX) IV  40 mg Intravenous QHS  . polyethylene glycol  17 g Oral Daily    Assessment/Plan Pancytopenia with history of left breast cancer and some necrotic tissue removed from her left adrenal. Laparoscopic splenectomy for splenomegaly and pancytopenia,(adrenal gland was not biopsied or removed during this procedure)  04/06/15, DR. Johnathan Hausen (adrenal  Mental status changes Pulmonary nodules/lesions Chronic diastolic heart failure Essential hypertension Hyperlipidemia Diet-controlled DM2 Antibiotics: None DVT:  Heparin/SCD   Plan:  IS, mobilize and keep her on full liquids till she has BM.  She can transition to PO steroids and PPI whenever Medicine would like to make transition.  Pathology is still pending.    LOS: 9 days    [name] [name] 04/08/2015

## 2015-04-08 NOTE — Progress Notes (Signed)
CSW continuing to follow for disposition planning.  Pt preference is [facility], but facility responded considering and has not yet given a definitive bed offer. CSW sent updated clinicals to [facility] today.  CSW visited pt room and pt being assisted to bathroom by RN. No family present at bedside.  CSW contacted pt daughter, [name] via telephone. CSW discussed with pt daughter that [facility] has not yet given a definitive bed offer and CSW is continuing to update the facility. CSW encourage pt daughter for pt and pt family to consider a secondary option in the instance that [facility] feels that they can not meet pt needs which is a possibility. Pt daughter expressed understanding and discussed that pt and pt family would review options.   CSW to continue to follow to provide support and assist with pt disposition needs.  Alison Murray, MSW, [place] [facility] [phone]

## 2015-04-09 DIAGNOSIS — F329 Major depressive disorder, single episode, unspecified: Secondary | ICD-10-CM

## 2015-04-09 LAB — CBC
HEMATOCRIT: 27 % — AB (ref 36.0–46.0)
HEMOGLOBIN: 8.4 g/dL — AB (ref 12.0–15.0)
MCH: 30.3 pg (ref 26.0–34.0)
MCHC: 31.1 g/dL (ref 30.0–36.0)
MCV: 97.5 fL (ref 78.0–100.0)
Platelets: 498 10*3/uL — ABNORMAL HIGH (ref 150–400)
RBC: 2.77 MIL/uL — AB (ref 3.87–5.11)
RDW: 15.6 % — ABNORMAL HIGH (ref 11.5–15.5)
WBC: 12.6 10*3/uL — ABNORMAL HIGH (ref 4.0–10.5)

## 2015-04-09 LAB — COMPREHENSIVE METABOLIC PANEL
ALBUMIN: 2.3 g/dL — AB (ref 3.5–5.0)
ALK PHOS: 78 U/L (ref 38–126)
ALT: 21 U/L (ref 14–54)
AST: 48 U/L — ABNORMAL HIGH (ref 15–41)
Anion gap: 9 (ref 5–15)
BILIRUBIN TOTAL: 0.4 mg/dL (ref 0.3–1.2)
BUN: 5 mg/dL — ABNORMAL LOW (ref 6–20)
CALCIUM: 8.3 mg/dL — AB (ref 8.9–10.3)
CO2: 26 mmol/L (ref 22–32)
CREATININE: 0.72 mg/dL (ref 0.44–1.00)
Chloride: 105 mmol/L (ref 101–111)
GFR calc Af Amer: >60 mL/min (ref >60)
GFR calc non Af Amer: >60 mL/min (ref >60)
GLUCOSE: 117 mg/dL — AB (ref 65–99)
Potassium: 4 mmol/L (ref 3.5–5.1)
SODIUM: 140 mmol/L (ref 135–145)
Total Protein: 4.5 g/dL — ABNORMAL LOW (ref 6.5–8.1)

## 2015-04-09 MED ORDER — PANTOPRAZOLE SODIUM 40 MG PO TBEC
40.0000 mg | DELAYED_RELEASE_TABLET | Freq: Every day | ORAL | Status: DC
  Administered 2015-04-09 – 2015-04-11 (×3): 40 mg via ORAL
  Filled 2015-04-09 (×3): qty 1

## 2015-04-09 MED ORDER — HYDROCORTISONE 10 MG PO TABS
10.0000 mg | ORAL_TABLET | Freq: Two times a day (BID) | ORAL | Status: DC
  Administered 2015-04-09 – 2015-04-10 (×4): 10 mg via ORAL
  Filled 2015-04-09 (×8): qty 1

## 2015-04-09 MED ORDER — BISACODYL 10 MG RE SUPP: 10.0000 mg | Freq: Every day | RECTAL | Status: DC | PRN

## 2015-04-09 MED ORDER — MORPHINE SULFATE (PF) 2 MG/ML IV SOLN: 1.0000 mg | INTRAVENOUS | Status: DC | PRN

## 2015-04-09 NOTE — Progress Notes (Signed)
Physical Therapy Treatment Patient Details Name: [name] [name] MRN: [id number] DOB: 1935-03-24 Today's Date: May 02, 2015    History of Present Illness 80 y/o female who was admitted on 03/30/15 for mental status changes, weakness, difficulty speaking. She has a PMH significant for MGUS, hospitalized 8/25-03/21/15 for syncope at which time workup showed possible metastatic disease with lung nodules/adrenal nodules/liver lesions, pancytopenia, diastolic CHF, DM2, HLD, and depression.  Pt s/p laparoscopic splenectomy 04/05/15     PT Comments    Pt progressing well.  Pt ambulated in hallway and assisted to recliner.  Follow Up Recommendations  SNF;Supervision - Intermittent     Equipment Recommendations  Rolling walker with 5" wheels    Recommendations for Other Services       Precautions / Restrictions Precautions Precautions: Fall    Mobility  Bed Mobility Overal bed mobility: Needs Assistance Bed Mobility: Supine to Sit     Supine to sit: Supervision        Transfers Overall transfer level: Needs assistance Equipment used: Rolling walker (2 wheeled) Transfers: Sit to/from Stand Sit to Stand: Min guard         General transfer comment: min/guard for safety, no assistive device used today  Ambulation/Gait Ambulation/Gait assistance: Min guard Ambulation Distance (Feet): 160 Feet Assistive device: None (pushed IV pole)       General Gait Details: pt did not require assistive device today instead pushed IV pole, fatigues quickly however, L dressing dry/intact/clean after ambulating   Stairs            Wheelchair Mobility    Modified Rankin (Stroke Patients Only)       Balance                                    Cognition Arousal/Alertness: Awake/alert Behavior During Therapy: WFL for tasks assessed/performed Overall Cognitive Status: Within Functional Limits for tasks assessed                      Exercises      General  Comments        Pertinent Vitals/Pain Pain Assessment: 0-10 Pain Score: 1  Pain Location: surgical site Pain Descriptors / Indicators: Sore Pain Intervention(s): Monitored during session;Repositioned    Home Living                      Prior Function            PT Goals (current goals can now be found in the care plan section) Progress towards PT goals: Progressing toward goals    Frequency  Min 3X/week    PT Plan Current plan remains appropriate    Co-evaluation             End of Session   Activity Tolerance: Patient tolerated treatment well Patient left: in chair;with call bell/phone within reach;with chair alarm set     Time: 0932-3557 PT Time Calculation (min) (ACUTE ONLY): 11 min  Charges:  $Gait Training: 8-22 mins                    G Codes:      LEMYRE,KATHrine E 05/02/15, 12:41 PM Carmelia Bake, PT, DPT May 02, 2015 Pager: [phone]

## 2015-04-09 NOTE — Progress Notes (Signed)
Key Points: Use following P&T approved IV to PO antibiotic change policy.  Description contains the criteria that are approved Note: Policy Excludes:  Esophagectomy patientsPHARMACIST - PHYSICIAN COMMUNICATION  DR: Allyson Sabal CONCERNING: IV to Oral Route Change Policy  RECOMMENDATION: This patient is receiving protonix by the intravenous route.  Based on criteria approved by the Pharmacy and Therapeutics Committee, the intravenous medication(s) is/are being converted to the equivalent oral dose form(s).   DESCRIPTION: These criteria include:  The patient is eating (either orally or via tube) and/or has been taking other orally administered medications for a least 24 hours  The patient has no evidence of active gastrointestinal bleeding or impaired GI absorption (gastrectomy, short bowel, patient on TNA or NPO).  If you have questions about this conversion, please contact the Pharmacy Department  []   [phone] )  [facility] []   [phone] )  [facility] []   [phone] )  [facility] []   [phone] )  [facility] [x]   [phone] )  [place], [facility] 04/09/2015 12:36 PM

## 2015-04-09 NOTE — Progress Notes (Signed)
Occupational Therapy Treatment Patient Details Name: [name] [name] MRN: [id number] DOB: 04-21-1935 Today's Date: 04/09/2015    History of present illness 80 y/o female who was admitted on 03/30/15 for mental status changes, weakness, difficulty speaking. She has a PMH significant for MGUS, hospitalized 8/25-03/21/15 for syncope at which time workup showed possible metastatic disease with lung nodules/adrenal nodules/liver lesions, pancytopenia, diastolic CHF, DM2, HLD, and depression.  Pt s/p laparoscopic splenectomy 04/05/15    OT comments  Patient progressing towards OT goals. No OT re-evaluation needed, all goals and plan of care remain appropriate for patient after recent laparoscopic splenectomy on 04/05/15.    Follow Up Recommendations  SNF;Supervision/Assistance - 24 hour    Equipment Recommendations  Other (comment) (TBD next venue of care)    Recommendations for Other Services  None at this time   Precautions / Restrictions Precautions Precautions: Fall Restrictions Weight Bearing Restrictions: No    Mobility Bed Mobility Overal bed mobility: Needs Assistance Bed Mobility: Supine to Sit     Supine to sit: Supervision     General bed mobility comments: Pt found seated EOB upon OT entering/exiting room  Transfers Overall transfer level: Needs assistance Equipment used: None Transfers: Sit to/from Stand Sit to Stand: Min guard;Min assist   General transfer comment: min/guard/assist for safety, no assistive device used today    Balance Overall balance assessment: Needs assistance Sitting-balance support: No upper extremity supported;Feet supported Sitting balance-Leahy Scale: Good     Standing balance support: No upper extremity supported;During functional activity Standing balance-Leahy Scale: Fair   ADL Overall ADL's : Needs assistance/impaired Eating/Feeding: Set up;Sitting   Grooming: Min guard;Standing   Upper Body Bathing: Set up;Sitting   Lower Body  Bathing: Minimal assistance;Sit to/from stand   Upper Body Dressing : Set up;Sitting   Lower Body Dressing: Minimal assistance;Sit to/from stand   Toilet Transfer: BSC;Grab bars;Minimal assistance   Toileting- Clothing Manipulation and Hygiene: Supervision/safety;Sitting/lateral lean Functional mobility during ADLs: Min guard;Minimal assistance General ADL Comments: Pt overall min guard/min assist level for ADLs and functional mobility. Pt will continue to benefit from acute OT and ongoing rehab at [facility]. All goals are currently appropriate.      Cognition   Behavior During Therapy: WFL for tasks assessed/performed Overall Cognitive Status: Within Functional Limits for tasks assessed                 Pertinent Vitals/ Pain       Pain Assessment: No/denies pain Pain Score: 1  Pain Location: surgical site Pain Descriptors / Indicators: Sore Pain Intervention(s): Monitored during session;Repositioned   Frequency Min 2X/week     Progress Toward Goals  OT Goals(current goals can now befound in the care plan section)  Progress towards OT goals: Progressing toward goals     Plan Discharge plan remains appropriate    End of Session Equipment Utilized During Treatment: Gait belt   Activity Tolerance     Patient Left in bed;with call bell/phone within reach;with nursing/sitter in room;with family/visitor present (seated EOB for nursing to administer medication)     Time: 8657-8469 OT Time Calculation (min): 11 min  Charges: OT General Charges $OT Visit: 1 Procedure OT Treatments $Self Care/Home Management : 8-22 mins  CLAY,PATRICIA , MS, OTR/L, CLT Pager: [phone]  04/09/2015, 3:05 PM

## 2015-04-09 NOTE — Progress Notes (Signed)
Nutrition Follow-up  DOCUMENTATION CODES:   Non-severe (moderate) malnutrition in context of acute illness/injury  INTERVENTION:  - Continue Ensure Enlive BID and Magic Cup once/day - RD will continue to monitor for needs  NUTRITION DIAGNOSIS:   Inadequate oral intake related to lethargy/confusion, nausea as evidenced by meal completion < 25%. -improving  GOAL:   Patient will meet greater than or equal to 90% of their needs -unmet  MONITOR:   PO intake, Supplement acceptance, Weight trends, Labs, I & O's  ASSESSMENT:   80 y.o. female with PMH of hypertension, hyperlipidemia, diet-controlled diabetes, depression, osteoporosis, MGUS, breast cancer (s/P of mastectomy 1981), pancytopenia, who presents with difficulty speaking and generalized weakness.  9/20 Pt has had several changes in diet order over the past 7 days. Diet advanced from FLD to Soft diet at 0839 this AM. Pt reports she has been drinking juice throughout the day today but has not had solid foods. She states that diet is to be advanced today but that she is unsure of details; informed her of current diet order but that this may have changed depending on what MD told her.  Pt was confused at last visit and not sure if this has resolved. She states she thinks RN added ordered Miralax to the juice she has been drinking to help her take it.   She denies abdominal pain or nausea at this time but states she is not feeling hungry. Not meeting needs. Will continue current supplements and monitor for need to adjust. Medications reviewed. Labs reviewed; BUN: 5, Ca: 8.3 mg/dL.    9/13 - Pt minimally communicative during interaction.  - Per chart review, she ate 0% of all meals yesterday with notes indicating she was nauseated during breakfast and was unable to be aroused for dinner.  - She states that she ate "a little bit" of breakfast this AM and recalls having eggs on her tray but unable to give further detail.  - RN in the  room reports that pt had eggs and grits and ate "maybe a bite." RN does state that pt has been consuming liquids and drank part of an Ensure this AM.  - Pt has been having coughing instances but RN is unsure if this is related to intakes as pt coughs even without intakes. - Pt unable to give details about intakes or weight trends PTA.  - Per weight hx review, pt has gained 6 lbs in the past 2 days; mild edema to BLE noted but question accuracy of weight recordings in short time frame. Per review, pt has lost 7 lbs (5% body weight) in the past 14 days which is significant for time frame. - Mild muscle wasting noted to clavicle and shoulder area.  Diet Order:  DIET SOFT Room service appropriate?: Yes; Fluid consistency:: Thin  Skin:  Wound (see comment) (L flank wound)  Last BM:  9/19  Height:   Ht Readings from Last 1 Encounters:  04/07/15 5\' 8"  (1.727 m)    Weight:   Wt Readings from Last 1 Encounters:  04/09/15 148 lb 8 oz (67.359 kg)    Ideal Body Weight:  63.64 kg (kg)  BMI:  Body mass index is 22.58 kg/(m^2).  Estimated Nutritional Needs:   Kcal:  1300-1500  Protein:  60-70 grams  Fluid:  1.8-2 L/day  EDUCATION NEEDS:   No education needs identified at this time     Jarome Matin, RD, LDN Inpatient Clinical Dietitian Pager # [phone] After hours/weekend pager # [phone]

## 2015-04-09 NOTE — Care Management Important Message (Signed)
Important Message  Patient Details  Name: Ebany Bowermaster MRN: [id number] Date of Birth: 04/16/1935   Medicare Important Message Given:  Yes-fourth notification given    Camillo Flaming 04/09/2015, 11:00 AMImportant Message  Patient Details  Name: Amiayah Giebel MRN: [id number] Date of Birth: 1934-09-15   Medicare Important Message Given:  Yes-fourth notification given    Camillo Flaming 04/09/2015, 10:59 AM

## 2015-04-09 NOTE — Progress Notes (Signed)
PROGRESS NOTE    [name] [name] EYC:[id number] DOB: Feb 21, 1935 DOA: 03/30/2015 PCP: Jerlyn Ly, MD  Oncologist: Dr. Marin Olp  HPI/Brief narrative 80 year old female with history of HTN, HLD, diet-controlled DM 2, depression, MGUS, breast cancer status post mastectomy 1981, pancytopenia, hospitalized 8/25-03/21/15 for syncope at which time workup showed possible metastatic disease with lung nodules/adrenal nodules/liver lesions, admitted again to [facility] on 03/30/15 with complaints of weakness, poor oral intake, several days difficulty speaking but no unilateral weakness or visual abnormalities. Mild diarrhea. In the ED, pancytopenic and CT head shows multiple low-attenuation areas probably due to old ischemia but acute process or metastatic lesions not excluded. MRI brain _ no acute findings. She remains confused, extremely weak and poor appetite. Oncology consulted >   Adrenal lesion Bx by interventional radiology was nondiagnostic - [facility] performed splenectomy on 9/16. Patient has been stable postoperatively. Patient had Lexi scan Myoview prior to surgery. Oncology following. Patient will likely discharge to [facility] place when okay with surgery and oncology   Subjective-complains of generalized weakness, no active bleeding from the incision overnight  Assessment/Plan:  Metabolic encephalopathy/Altered MS unclear etiology, patient has remained confused during this hospitalization with waxing and maintaining alertness likely this is patient's new baseline MRI brain negative for metastatic disease or acute findings. Oncology cannot rule out paraneoplastic syndrome. -Infectious workup negative. Less likely to be TIA given duration of symptoms.? Related to dehydration. No gross focal neurologic deficits otherwise - HIV: neg, RPR: non reactive, TSH 4.4019 and B 12: 1453 - BCx's: neg to date. Neurologically stable postoperatively  Acute blood loss hemoglobin remained stable postoperatively  without the need for any transfusion.   S/P laparoscopic splenectomy 04/05/15 by (Dr. Hassell Done) for doing well postoperatively, still constipated. Surgery advancing the patient's diet. Currently on a soft diet, patient now has post splenectomy leukocytosis   Constipation-mobilize , continue constipation regimen   Post splenectomy leukocytosis/splenomegaly/status postsplenectomy/adrenal nodules Postsplenectomy leukocytosis, await further recommendations from oncology. CT-guided biopsy of adrenal gland was nondiagnostic, therefore patient underwent splenectomy on 9/16.On stress dose steroids, Reduce Solu-Cortef to 25 IV every 12 today, DC IV Solu-Cortef start hydrocortisone 20 mg in the a.m. 10 mg in the p.m. Outpatient PET scan   Coronary artery disease/pericardial effusion-stable, risk factors include  advanced age, remote tobacco use, HTN, pre DM. Her EKG shows nonspecific T wave abnormality , moderate pericardial effusion on echo that is unchanged. 2D echo with normal LVF and no RWMAs. Seen by cardiology, she has evidence of coronary artery calcifications on chest CT. she underwent Myoview on 9/15 which was negative.no evidence of pericardial tamponade. Cardiology has signed off   Dehydration with hyponatremia - Resolved,  MGUS - Oncology working up further  Chronic diastolic CHF - Not on diuretics at home. Compensated.  Diet-controlled DM 2-continue SSI  Hyperlipidemia - Continue statins  Diarrhea Resolved,  Essential hypertension Stable    DVT prophylaxis: Subcutaneous heparin  Code Status: Full Family Communication: Discussed extensively with daughter [name] on 04/01/2015. Disposition Plan: Anticipate discharge in one to 2 days   Consultants:  Oncology  CCS  IR  Cardiology  Procedures:  None  Antibiotics:  None      Objective: Filed Vitals:   04/08/15 0532 04/08/15 1530 04/08/15 2036 04/09/15 0500  BP: 138/51 143/53 120/46 131/74  Pulse:  80 80 82 89  Temp: 98.3 F (36.8 C)  98.7 F (37.1 C) 98.5 F (36.9 C)  TempSrc: Oral  Oral Oral  Resp: 18  18 19   Height:  Weight:    67.359 kg (148 lb 8 oz)  SpO2: 97% 98% 97% 99%    Intake/Output Summary (Last 24 hours) at 04/09/15 1225 Last data filed at 04/09/15 0500  Gross per 24 hour  Intake   2680 ml  Output      0 ml  Net   2680 ml   Filed Weights   04/06/15 0400 04/07/15 0619 04/09/15 0500  Weight: 66 kg (145 lb 8.1 oz) 66.225 kg (146 lb) 67.359 kg (148 lb 8 oz)     Exam:  General exam: Pleasant elderly female lying comfortably supine in bed. Chronically ill looking. Dry oral mucosa. Respiratory system: Clear. No increased work of breathing. Cardiovascular system: S1 & S2 heard, RRR. No JVD, murmurs, gallops, clicks or pedal edema.  Gastrointestinal system: Abdomen is nondistended, soft and nontender. Normal bowel sounds heard. Central nervous system: Alert and oriented only to self. No focal neurological deficits. Extremities: Symmetric 5 x 5 power.   Data Reviewed: Basic Metabolic Panel:  Recent Labs Lab 04/04/15 0500 04/05/15 1435 04/06/15 0430 04/07/15 0835 04/09/15 0509  NA 140  --  139 140 140  K 4.3  --  4.2 4.3 4.0  CL 102  --  106 107 105  CO2 28  --  29 29 26   GLUCOSE 148*  --  160* 132* 117*  BUN 19  --  16 12 5*  CREATININE 1.14* 1.08* 0.90 0.85 0.72  CALCIUM 9.0  --  7.9* 8.3* 8.3*   Liver Function Tests:  Recent Labs Lab 04/04/15 0500 04/06/15 0430 04/07/15 0835 04/09/15 0509  AST 36 44* 38 48*  ALT 11* 18 16 21   ALKPHOS 88 74 81 78  BILITOT 0.5 0.3 0.4 0.4  PROT 5.9* 4.5* 4.9* 4.5*  ALBUMIN 2.9* 2.3* 2.5* 2.3*   No results for input(s): LIPASE, AMYLASE in the last 168 hours. No results for input(s): AMMONIA in the last 168 hours. CBC:  Recent Labs Lab 04/03/15 0559 04/04/15 0500  04/06/15 0430 04/07/15 0835 04/07/15 2348 04/08/15 0521 04/09/15 0509  WBC 1.2* 8.1  < > 9.1 17.0* 16.3* 16.1* 12.6*  NEUTROABS  1.0* 7.4  --  7.5  --   --   --   --   HGB 10.2* 9.7*  < > 7.6* 8.5* 8.2* 8.0* 8.4*  HCT 29.3* 29.4*  < > 23.8* 23.9* 26.1* 26.3* 27.0*  MCV 92.4 91.9  < > 93.7 101.7* 96.7 96.3 97.5  PLT 122* 116*  < > 214 318 373 398 498*  < > = values in this interval not displayed. Cardiac Enzymes: No results for input(s): CKTOTAL, CKMB, CKMBINDEX, TROPONINI in the last 168 hours. BNP (last 3 results) No results for input(s): PROBNP in the last 8760 hours. CBG:  Recent Labs Lab 04/03/15 0735 04/04/15 0718 04/05/15 0755 04/05/15 1430  GLUCAP 162* 131* 125* 129*    Recent Results (from the past 240 hour(s))  Culture, blood (x 2)     Status: None   Collection Time: 03/31/15  1:30 AM  Result Value Ref Range Status   Specimen Description BLOOD RIGHT ARM  Final   Special Requests BOTTLES DRAWN AEROBIC ONLY 5CC  Final   Culture   Final    NO GROWTH 5 DAYS Performed at [facility]    Report Status 04/05/2015 FINAL  Final  Culture, blood (x 2)     Status: None   Collection Time: 03/31/15  1:50 AM  Result Value Ref Range Status  Specimen Description BLOOD RIGHT HAND  Final   Special Requests BOTTLES DRAWN AEROBIC ONLY 5CC  Final   Culture   Final    NO GROWTH 5 DAYS Performed at [facility]    Report Status 04/05/2015 FINAL  Final  Surgical pcr screen     Status: None   Collection Time: 04/04/15 10:36 PM  Result Value Ref Range Status   MRSA, PCR NEGATIVE NEGATIVE Final   Staphylococcus aureus NEGATIVE NEGATIVE Final    Comment:        The Xpert SA Assay (FDA approved for NASAL specimens in patients over 84 years of age), is one component of a comprehensive surveillance program.  Test performance has been validated by [facility] for patients greater than or equal to 64 year old. It is not intended to diagnose infection nor to guide or monitor treatment.          Studies: No results found.      Scheduled Meds: . sodium chloride   Intravenous Once    . heparin subcutaneous  5,000 Units Subcutaneous 3 times per day  . hydrocortisone sod succinate (SOLU-CORTEF) inj  25 mg Intravenous Q12H  . pantoprazole (PROTONIX) IV  40 mg Intravenous QHS  . polyethylene glycol  17 g Oral Daily   Continuous Infusions: . dextrose 5 % and 0.45 % NaCl with KCl 20 mEq/L 100 mL (04/09/15 1158)    Principal Problem:   S/P laparoscopic splenectomy Active Problems:   MGUS (monoclonal gammopathy of unknown significance)   Adrenal nodule   Chronic diastolic heart failure   Pulmonary nodules/lesions, multiple   DM type 2 (diabetes mellitus, type 2)   Pancytopenia   Generalized weakness   HLD (hyperlipidemia)   Depression   Slurred speech   Difficulty speaking   Protein calorie malnutrition   Diarrhea   Pericardial effusion   Coronary artery calcification seen on CAT scan    Time spent: 30 minutes.    Reyne Dumas, MD,  [facility] Pager [phone]  If 7PM-7AM, please contact night-coverage [email or web address] Password [email or web address] 04/09/2015, 12:25 PM    LOS: 10 days

## 2015-04-09 NOTE — Progress Notes (Signed)
4 Days Post-Op  Subjective: She seems a little confused this AM.  She says she did have a small BM yesterday, but none recorded. Her incisions all look fine.   Objective: Vital signs in last 24 hours: Temp:  [98.5 F (36.9 C)-98.7 F (37.1 C)] 98.5 F (36.9 C) (09/20 0500) Pulse Rate:  [80-89] 89 (09/20 0500) Resp:  [18-19] 19 (09/20 0500) BP: (120-143)/(46-74) 131/74 mmHg (09/20 0500) SpO2:  [97 %-99 %] 99 % (09/20 0500) Weight:  [67.359 kg (148 lb 8 oz)] 67.359 kg (148 lb 8 oz) (09/20 0500) Last BM Date: 04/08/15 720 PO recorded No urine or Bm recorded Full liquids  Afebrile, VSS Labs all continue to look good Intake/Output from previous day: 09/19 0701 - 09/20 0700 In: 2920 [P.O.:720; I.V.:2200] Out: -  Intake/Output this shift:    General appearance: alert, cooperative, no distress and i thought she was a little confused this Am, said she thought I had forgot her.  she said she just woke up, they don't usually let her sleep this long,  GI: soft sore, sites OK, + BS, and flatus  Lab Results:   Recent Labs  04/08/15 0521 04/09/15 0509  WBC 16.1* 12.6*  HGB 8.0* 8.4*  HCT 26.3* 27.0*  PLT 398 498*    BMET  Recent Labs  04/07/15 0835 04/09/15 0509  NA 140 140  K 4.3 4.0  CL 107 105  CO2 29 26  GLUCOSE 132* 117*  BUN 12 5*  CREATININE 0.85 0.72  CALCIUM 8.3* 8.3*   PT/INR No results for input(s): LABPROT, INR in the last 72 hours.   Recent Labs Lab 04/04/15 0500 04/06/15 0430 04/07/15 0835 04/09/15 0509  AST 36 44* 38 48*  ALT 11* 18 16 21   ALKPHOS 88 74 81 78  BILITOT 0.5 0.3 0.4 0.4  PROT 5.9* 4.5* 4.9* 4.5*  ALBUMIN 2.9* 2.3* 2.5* 2.3*     Lipase     Component Value Date/Time   LIPASE 54* 03/14/2015 1905     Studies/Results: No results found.  Medications: . sodium chloride   Intravenous Once  . heparin subcutaneous  5,000 Units Subcutaneous 3 times per day  . hydrocortisone sod succinate (SOLU-CORTEF) inj  25 mg Intravenous  Q12H  . pantoprazole (PROTONIX) IV  40 mg Intravenous QHS  . polyethylene glycol  17 g Oral Daily    Assessment/Plan Pancytopenia with history of left breast cancer and some necrotic tissue removed from her left adrenal. Laparoscopic splenectomy for splenomegaly and pancytopenia,(adrenal gland was not biopsied or removed during this procedure) 04/06/15, DR. Johnathan Hausen (adrenal  Mental status changes Pulmonary nodules/lesions Chronic diastolic heart failure Essential hypertension Hyperlipidemia Diet-controlled DM2 Antibiotics: None DVT: Heparin/SCD   Plan:  I will advance her diet.  Pathology is still not back on the spleen.  She has OT and PT ordered and says she is trying to get into [facility].      LOS: 10 days    JENNINGS,WILLARD 04/09/2015

## 2015-04-09 NOTE — Progress Notes (Signed)
Ms. Stith seems to be doing a little bit better. She still is weak. It sounds like she might be going to assisted living.  I spoke to pathology yesterday. There is still no result on the splenectomy. Again, I'm not sure if any other biopsies were taken.  Her blood counts are doing quite well. Her white cell count 12.6. Hemoglobin 8.4. Platelet count 498.  On her physical exam, there is nothing new that I can find. She still has a dressing about the laparoscopy site. Her lungs are clear. Cardiac exam regular rate and rhythm. Her vital signs are all stable. Blood pressure 131/74. Temperature 98.5.  Again, we will have to await the pathology report.  I appreciate the wonderful care that she is getting up on [address].  [name].  Hebrews 12;12

## 2015-04-10 ENCOUNTER — Other Ambulatory Visit: Payer: Medicare Other

## 2015-04-10 ENCOUNTER — Ambulatory Visit: Payer: Medicare Other | Admitting: Hematology & Oncology

## 2015-04-10 LAB — COMPREHENSIVE METABOLIC PANEL
ALBUMIN: 2.3 g/dL — AB (ref 3.5–5.0)
ALK PHOS: 79 U/L (ref 38–126)
ALT: 20 U/L (ref 14–54)
AST: 44 U/L — ABNORMAL HIGH (ref 15–41)
Anion gap: 6 (ref 5–15)
BUN: <5 mg/dL — ABNORMAL LOW (ref 6–20)
CALCIUM: 8.1 mg/dL — AB (ref 8.9–10.3)
CO2: 27 mmol/L (ref 22–32)
CREATININE: 0.72 mg/dL (ref 0.44–1.00)
Chloride: 104 mmol/L (ref 101–111)
GFR calc Af Amer: >60 mL/min (ref >60)
GFR calc non Af Amer: >60 mL/min (ref >60)
GLUCOSE: 89 mg/dL (ref 65–99)
Potassium: 3.6 mmol/L (ref 3.5–5.1)
SODIUM: 137 mmol/L (ref 135–145)
Total Bilirubin: 0.5 mg/dL (ref 0.3–1.2)
Total Protein: 4.7 g/dL — ABNORMAL LOW (ref 6.5–8.1)

## 2015-04-10 LAB — CBC
HCT: 27.7 % — ABNORMAL LOW (ref 36.0–46.0)
HEMOGLOBIN: 8.6 g/dL — AB (ref 12.0–15.0)
MCH: 30.2 pg (ref 26.0–34.0)
MCHC: 31 g/dL (ref 30.0–36.0)
MCV: 97.2 fL (ref 78.0–100.0)
Platelets: 634 10*3/uL — ABNORMAL HIGH (ref 150–400)
RBC: 2.85 MIL/uL — AB (ref 3.87–5.11)
RDW: 15.7 % — ABNORMAL HIGH (ref 11.5–15.5)
WBC: 12.3 10*3/uL — ABNORMAL HIGH (ref 4.0–10.5)

## 2015-04-10 MED ORDER — POLYETHYLENE GLYCOL 3350 17 G PO PACK: 17.0000 g | PACK | Freq: Every day | ORAL | Status: AC

## 2015-04-10 MED ORDER — SIMETHICONE 80 MG PO CHEW
40.0000 mg | CHEWABLE_TABLET | Freq: Four times a day (QID) | ORAL | Status: AC | PRN
Start: 2015-04-10 — End: ?

## 2015-04-10 MED ORDER — PANTOPRAZOLE SODIUM 40 MG PO TBEC: 40.0000 mg | DELAYED_RELEASE_TABLET | Freq: Every day | ORAL | Status: AC

## 2015-04-10 MED ORDER — HYDROCODONE-ACETAMINOPHEN 5-325 MG PO TABS: 1.0000 | ORAL_TABLET | ORAL | Status: DC | PRN

## 2015-04-10 MED ORDER — HYDROCORTISONE 10 MG PO TABS: 10.0000 mg | ORAL_TABLET | Freq: Two times a day (BID) | ORAL | Status: DC

## 2015-04-10 NOTE — Discharge Summary (Signed)
Physician Discharge Summary  Safa Derner MRN: [id number] DOB/AGE: Feb 12, 1935 80 y.o.  PCP: Jerlyn Ly, MD   Admit date: 03/30/2015 Discharge date: 04/11/2015  Discharge Diagnoses:     Principal Problem:   S/P laparoscopic splenectomy Active Problems:   MGUS (monoclonal gammopathy of unknown significance)   Adrenal nodule   Chronic diastolic heart failure   Pulmonary nodules/lesions, multiple   DM type 2 (diabetes mellitus, type 2)   Pancytopenia   Generalized weakness   HLD (hyperlipidemia)   Depression   Slurred speech   Difficulty speaking   Protein calorie malnutrition   Diarrhea   Pericardial effusion   Coronary artery calcification seen on CAT scan    Follow-up recommendations Follow-up with PCP in 3-5 days , including all  additional recommended appointments as below Follow-up CBC, CMP in 3-5 days follow up with Dr. Hassell Done in 2 weeks. Follow-up with oncology,Peter Oletha Cruel, MD, patient will need an outpatient PET scan     Medication List    STOP taking these medications        aspirin EC 81 MG tablet     propranolol ER 60 MG 24 hr capsule  Commonly known as:  INDERAL LA     raloxifene 60 MG tablet  Commonly known as:  EVISTA      TAKE these medications        escitalopram 10 MG tablet  Commonly known as:  LEXAPRO  Take 5 mg by mouth every morning.     feeding supplement (ENSURE ENLIVE) Liqd  Take 237 mLs by mouth 2 (two) times daily between meals.     fish oil-omega-3 fatty acids 1000 MG capsule  Take 2 g by mouth every morning.     HYDROcodone-acetaminophen 5-325 MG per tablet  Commonly known as:  NORCO/VICODIN  Take 1 tablet by mouth every 4 (four) hours as needed for moderate pain.     hydrocortisone 10 MG tablet  Commonly known as:  CORTEF  Take 1 tablet (10 mg total) by mouth 2 (two) times daily.     multivitamin capsule  Take 1 capsule by mouth every morning.     pantoprazole 40 MG tablet  Commonly known as:  PROTONIX  Take 1  tablet (40 mg total) by mouth at bedtime.     polyethylene glycol packet  Commonly known as:  MIRALAX / GLYCOLAX  Take 17 g by mouth daily.     rosuvastatin 20 MG tablet  Commonly known as:  CRESTOR  Take 10 mg by mouth every morning.     simethicone 80 MG chewable tablet  Commonly known as:  MYLICON  Chew 0.5 tablets (40 mg total) by mouth every 6 (six) hours as needed for flatulence (bloating).     Vitamin D (Ergocalciferol) 50000 UNITS Caps capsule  Commonly known as:  DRISDOL  Take 50,000 Units by mouth every Monday.         Discharge Condition: Stable  Disposition: 01-Home or Self Care   Consults:  Oncology General surgery     Significant Diagnostic Studies:  Dg Chest 2 View  03/30/2015   CLINICAL DATA:  Cough.  Weakness.  Not eating.  EXAM: CHEST  2 VIEW  COMPARISON:  03/14/2015  FINDINGS: 1.7 mm nodular opacity in the right lung base corresponding to metastatic lesions seen on CT chest 03/15/2015. Mild cardiac enlargement. No pulmonary vascular congestion. No focal consolidation in the lungs. No blunting of costophrenic angles. No pneumothorax. Mediastinal contours appear intact. Surgical clips in the left  axilla. Degenerative changes in the spine. Degenerative changes in the shoulders.  IMPRESSION: Right lower lung nodule corresponding to metastatic lesions seen on previous CT chest. No acute infiltration or consolidation.   Electronically Signed   By: Lucienne Capers M.D.   On: 03/30/2015 21:17   Ct Head Wo Contrast  03/30/2015   CLINICAL DATA:  Patient is weekend not eating. Slurred speech. Recent hospitalizations but last ED visit was discharged home.  EXAM: CT HEAD WITHOUT CONTRAST  TECHNIQUE: Contiguous axial images were obtained from the base of the skull through the vertex without intravenous contrast.  COMPARISON:  03/14/2015  FINDINGS: Diffuse cerebral atrophy. Low-attenuation changes in the deep white matter probably representing central atrophy. Focal old  areas of encephalomalacia demonstrated in the cerebellar hemispheres bilaterally, in the frontal lobes bilaterally, and in the left posterior parietal region. These are probably areas of old ischemia. However, mass lesions or acute process not entirely excluded. Consider MRI for further evaluation if clinically indicated. No mass effect or midline shift. No abnormal extra-axial fluid collections. Gray-white matter junctions are distinct. Basal cisterns are not effaced. No evidence of acute intracranial hemorrhage. No ventricular dilatation. Calvarium appears intact. Paranasal sinuses are clear. Mastoid air cells are not opacified.  IMPRESSION: Multifocal low-attenuation areas are probably due to old ischemia abut acute process or metastatic lesions are not excluded. Consider further evaluation with MRI if clinically indicated. No acute intracranial hemorrhage or mass effect.   Electronically Signed   By: Lucienne Capers M.D.   On: 03/30/2015 22:22   Ct Head Wo Contrast  03/14/2015   CLINICAL DATA:  [place] backwards today.  Loss of consciousness.  EXAM: CT HEAD WITHOUT CONTRAST  TECHNIQUE: Contiguous axial images were obtained from the base of the skull through the vertex without intravenous contrast.  COMPARISON:  09/12/2010.  FINDINGS: Diffusely enlarged ventricles and subarachnoid spaces. No skull fracture, intracranial hemorrhage or paranasal sinus air-fluid levels.  IMPRESSION: Mild atrophy.  No acute abnormality.   Electronically Signed   By: Claudie Revering M.D.   On: 03/14/2015 19:32   Ct Chest Wo Contrast  03/14/2015   CLINICAL DATA:  Multiple episodes of syncope today. Dizziness when changing from sitting to standing. Right-sided chest pain and shortness of breath. Falls.  EXAM: CT CHEST WITHOUT CONTRAST  TECHNIQUE: Multidetector CT imaging of the chest was performed following the standard protocol without IV contrast.  COMPARISON:  None.  FINDINGS: Normal heart size. Calcification in the Coronary  arteries, mitral valve annulus, and aortic valve as well as in the aorta. Small to moderate pericardial effusion. Normal caliber thoracic aorta. Scattered mediastinal lymph nodes are not pathologically enlarged. Esophagus is decompressed.  1.8 cm diameter nodule in the right lower lung. This is indeterminate and primary or metastatic disease needs to be excluded. No other focal lung lesions. No focal consolidation or airspace disease. No pneumothorax. No pleural effusions.  Left breast reconstruction. Prominent adrenal gland nodules bilaterally, right measuring 4.8 x 1.9 cm and left measuring 2.4 x 4.7 cm. Spleen is enlarged but appears homogeneous. Multiple low-attenuation lesions in the liver are likely represent cysts but given the presence of other possible metastatic lesions, further evaluation with contrast-enhanced CT or MRI is suggested. Degenerative changes in the spine. No destructive bone lesions appreciated.  IMPRESSION: 1.8 cm diameter right lower lung nodule and bilateral adrenal gland nodules suspicious for metastatic disease. Multiple low-attenuation lesions in the liver are probably cysts but further characterization is suggested with either contrast-enhanced CT or MRI. Small  to moderate pericardial effusion. Mild splenic enlargement.   Electronically Signed   By: Lucienne Capers M.D.   On: 03/14/2015 22:07   Ct Angio Chest Pe W/cm &/or Wo Cm  03/15/2015   CLINICAL DATA:  History of breast carcinoma with recent syncopal episode and back pain  EXAM: CT ANGIOGRAPHY CHEST WITH CONTRAST  TECHNIQUE: Multidetector CT imaging of the chest was performed using the standard protocol during bolus administration of intravenous contrast. Multiplanar CT image reconstructions and MIPs were obtained to evaluate the vascular anatomy.  CONTRAST:  155m OMNIPAQUE IOHEXOL 350 MG/ML SOLN  COMPARISON:  None.  FINDINGS: The lungs are well aerated bilaterally with small left-sided pleural effusion and left basilar  atelectasis. Single calcified granuloma is noted in the left lung. Scattered pulmonary nodules are identified throughout the lungs bilaterally. The largest of these lies in the right lower lobe best seen on image number 66 of series 8 measuring 19 mm in greatest dimension. A 6-7 mm nodule is noted in the left lower lobe best seen on image number 54 of series 8 as well as a tiny 2-3 mm nodule in the left apex best seen on image number 13 of series 8. Given the patient's clinical history these are consistent with metastatic foci.  The thoracic inlet is within normal limits. The thoracic aorta shows mild calcifications without aneurysmal dilatation or dissection. A pericardial effusion is noted. This measures approximately 2.3 cm in greatest thickness posteriorly. Mild coronary calcifications are noted. The pulmonary artery is well visualized and demonstrates a normal branching pattern. No filling defects to suggest pulmonary emboli are identified. No hilar or mediastinal adenopathy is noted. Some thickening of the midesophagus is noted just above the level of the carina best seen on image number 97 of series 7.  Changes consistent with breast implant are noted on the left. This is consistent with the patient's given clinical history of carcinoma of the breast. Postsurgical changes in the axilla are seen. No axillary adenopathy is identified. The upper abdomen again demonstrates hypodensities within the liver similar to that seen on prior CT. Adrenal lesions are again noted bilaterally.  Review of the MIP images confirms the above findings.  IMPRESSION: Changes consistent with metastatic disease within the adrenal glands bilaterally as well as the lungs bilaterally.  Multiple hypodensities are again seen within liver which may represent metastatic disease. Further evaluation is recommended.  No evidence of pulmonary embolism.  Pericardial effusion.  Thickening of the midesophagus of uncertain significance.    Electronically Signed   By: MInez CatalinaM.D.   On: 03/15/2015 10:05   Mr BJeri CosWMWContrast  03/31/2015   CLINICAL DATA:  New onset of generalized weakness and difficulty speaking. Personal history breast cancer.  EXAM: MRI HEAD WITHOUT AND WITH CONTRAST  TECHNIQUE: Multiplanar, multiecho pulse sequences of the brain and surrounding structures were obtained without and with intravenous contrast.  CONTRAST:  135mMULTIHANCE GADOBENATE DIMEGLUMINE 529 MG/ML IV SOLN  COMPARISON:  CT of the head 03/30/2015 and 09/12/2010.  FINDINGS: The diffusion-weighted images demonstrate no evidence for acute or subacute infarction. No acute hemorrhage or mass lesion is present. The ventricles are of normal size. No significant extra-axial fluid collection is present.  The basal ganglia and brainstem are within normal limits. Flow is present in the major intracranial arteries. The globes and orbits are intact. The paranasal sinuses and mastoid air cells are clear the skullbase is within normal limits. Midline structures are unremarkable.  The postcontrast images demonstrate  no pathologic enhancement.  IMPRESSION: 1. No acute or focal lesion to explain the patient's acute symptoms. 2. Mild atrophy is within normal limits for age. There is no significant white matter disease.   Electronically Signed   By: San Morelle M.D.   On: 03/31/2015 11:35   Mr Abdomen W Wo Contrast  03/20/2015   CLINICAL DATA:  Adrenal mass.  Right lower lobe lung lesion.  EXAM: MRI ABDOMEN WITHOUT AND WITH CONTRAST  TECHNIQUE: Multiplanar multisequence MR imaging of the abdomen was performed both before and after the administration of intravenous contrast.  CONTRAST:  79m MULTIHANCE GADOBENATE DIMEGLUMINE 529 MG/ML IV SOLN  COMPARISON:  Abdominal pelvic CT of 03/17/2015. Chest CT of 03/15/2015. History of breast cancer  FINDINGS: Portions of the exam, primarily the postcontrast dynamic series, are motion degraded.  Lower chest: Left larger than  right small bilateral pleural effusions. Left breast implant. Small to moderate pericardial effusion. Right lower lobe pulmonary nodule of 1.8 cm on image 5 of series 5. Left base atelectasis.  Hepatobiliary: Multiple T2 hyperintense well-circumscribed liver lesions are consistent with cysts. On post-contrast arterial phase imaging, there are numerous foci of hyper enhancement. Example series 13001. None of these areas are identified on later post-contrast images, and are favored to represent perfusion anomalies. Normal gallbladder, without biliary ductal dilatation.  Pancreas: Normal, without mass or ductal dilatation.  Spleen: Splenomegaly, greater than 17 cm craniocaudal.  Adrenals/Urinary Tract: Left larger than right adrenal masses. 3.2 x 2.3 cm on the left. 3.1 x 1.4 cm on the right. No signal dropout on out of phase imaging. Central necrosis as evidenced by hypo enhancement.  Bilateral renal cysts. medial interpolar 1.3 cm left renal lesion on image 39 of series 5 is likely hemorrhagic cyst; no post-contrast enhancement. No hydronephrosis.  Stomach/Bowel: Normal stomach and abdominal bowel loops.  Vascular/Lymphatic: Advanced aortic atherosclerosis. Retroperitoneal nodes are prominent but not pathologic by size criteria. Right cardiophrenic angle node is upper normal at 6 mm on image 23 of series 13001.  Other: No ascites.  Musculoskeletal: Convex left lumbar spine curvature.  IMPRESSION: 1. Bilateral adrenal masses with necrosis, most consistent with metastatic disease. 2. Right lung base nodule, likely representing pulmonary metastasis. 3. Bilateral pleural and pericardial effusions. 4. No definite evidence of hepatic metastasis. Multiple hepatic cysts. Arterial foci of hyper enhancement are likely perfusion anomalies. Given the history of breast cancer, recommend attention on follow-up to exclude less likely tiny hypervascular metastasis. 5. Splenomegaly.   Electronically Signed   By: KAbigail MiyamotoM.D.    On: 03/20/2015 08:30   Ct Abdomen Pelvis W Contrast  03/17/2015   CLINICAL DATA:  Pulmonary metastases, adrenal metastases, history of LEFT breast cancer 1981 post mastectomy and lymph node dissection  EXAM: CT ABDOMEN AND PELVIS WITH CONTRAST  TECHNIQUE: Multidetector CT imaging of the abdomen and pelvis was performed using the standard protocol following bolus administration of intravenous contrast. Sagittal and coronal MPR images reconstructed from axial data set.  CONTRAST:  1015mOMNIPAQUE IOHEXOL 300 MG/ML SOLN IV. Dilute oral contrast.  COMPARISON:  None; correlation CT chest 03/15/2015  FINDINGS: LEFT breast prosthesis.  Small BILATERAL pleural effusions and bibasilar atelectasis.  Small to moderate pericardial effusion.  RIGHT lower lobe mass 19 x 16 mm image 9.  Multiple low-attenuation foci within liver likely cysts, largest lateral segment LEFT lobe 2.3 x 1.8 cm.  Splenic enlargement, spleen measuring 17.9 x 13.0 x 6.7 cm.  BILATERAL adrenal masses, 4.3 x 2.8 cm LEFT and 4.3  x 2.1 cm RIGHT.  Tiny BILATERAL renal cysts.  Remainder of liver, pancreas, and adrenal glands normal.  Normal appendix.  Questionable rectal wall thickening versus artifact near anus.  Mild wall thickening at gastric cardia, unchanged from CT chest, question gastric neoplasm.  Remainder of stomach, colon and small bowel loops normal.  Unremarkable bladder, ureters, uterus and adnexa.  Free intraperitoneal fluid in pelvis.  Some additional mass, adenopathy, free air or hernia.  Scattered atherosclerotic calcifications.  Normal size retroperitoneal nodes.  Bones demineralized.  Schmorl's node superior endplate T10.  Bones demineralized.  IMPRESSION: RIGHT lower lobe mass 19 x 16 mm question primary versus metastatic focus.  BILATERAL adrenal masses concerning for metastases.  Significant splenic enlargement without focal lesion.  Gastric wall thickening at cardia, tumor not excluded; endoscopic evaluation recommended to exclude  gastric neoplasm.  Inferior rectal wall thickening versus artifact, recommend correlation with digital rectal exam and proctoscopy to exclude mass.  Ascites.   Electronically Signed   By: Lavonia Dana M.D.   On: 03/17/2015 11:00   Nm Myocar Multi W/spect W/wall Motion / Ef  04/04/2015    There was no ST segment deviation noted during stress.  The study is normal.  This is a low risk study. No ischemia.  Nuclear stress EF: 76%.   Low risk study. No ischemia identified. Normal EF.    Ct Biopsy  04/03/2015   CLINICAL DATA:  80 year old female with metastatic disease of uncertain etiology. She presents for CT-guided biopsy of left adrenal mass  EXAM: CT BIOPSY  Date: 04/03/2015  PROCEDURE: 1. CT-guided biopsy left adrenal mass Interventional Radiologist:  Criselda Peaches, MD  ANESTHESIA/SEDATION: Moderate (conscious) sedation was used. 1 mg Versed, 25 mcg Fentanyl were administered intravenously. The patient's vital signs were monitored continuously by radiology nursing throughout the procedure.  Sedation Time: 9 minutes  MEDICATIONS: None additional  TECHNIQUE: Informed consent was obtained from the patient following explanation of the procedure, risks, benefits and alternatives. The patient understands, agrees and consents for the procedure. All questions were addressed. A time out was performed.  A planning axial CT scan was performed. The large mass in the left adrenal gland was successfully identified. A suitable skin entry site was selected and marked. The region was sterilely prepped and draped in standard fashion using Betadine skin prep. Local anesthesia was attained by infiltration with 1% lidocaine. A small dermatotomy was made. Under intermittent CT fluoroscopic guidance, a 15 cm 17 gauge introducer needle was advanced through the retroperitoneal space and into the margin of the left adrenal mass. Multiple 18 gauge core biopsies were then coaxially obtained using the bio Pince automated biopsy  device. Biopsy specimens were placed in saline.  The introducer needle was removed. Post biopsy axial CT imaging demonstrates no evidence of complication. Specifically, there is no evidence of pneumothorax or periadrenal hemorrhage. The patient tolerated the procedure well.  COMPLICATIONS: None  Estimated blood loss:  0  IMPRESSION: Technically successful CT-guided core biopsy of left adrenal mass.  Signed,  Criselda Peaches, MD  [facility]  [facility]   Electronically Signed   By: Jacqulynn Cadet M.D.   On: 04/03/2015 16:47   Ct Biopsy  03/19/2015   INDICATION: Pancytopenia. Please perform CT guided bone marrow biopsy for tissue diagnostic purposes  EXAM: CT GUIDED BONE MARROW BIOPSY AND ASPIRATION.  MEDICATIONS: Fentanyl 50 mcg IV; Versed 1 mg IV  ANESTHESIA/SEDATION: Sedation Time  10 minutes  CONTRAST:  None  COMPLICATIONS:  None immediate.  PROCEDURE: Informed consent was obtained from the patient following an explanation of the procedure, risks, benefits and alternatives. The patient understands, agrees and consents for the procedure. All questions were addressed. A time out was performed prior to the initiation of the procedure. The patient was positioned prone and non-contrast localization CT was performed of the pelvis to demonstrate the iliac marrow spaces. The operative site was prepped and draped in the usual sterile fashion.  Under sterile conditions and local anesthesia, a 22 gauge spinal needle was utilized for procedural planning. Next, an 11 gauge coaxial bone biopsy needle was advanced into the left iliac marrow space. Needle position was confirmed with CT imaging. Initially, bone marrow aspiration was performed. Next, a bone marrow biopsy was obtained with the 11 gauge outer bone marrow device. Samples were prepared with the cytotechnologist and deemed adequate. The needle was removed intact. Hemostasis was obtained with compression and a  dressing was placed. The patient tolerated the procedure well without immediate post procedural complication.  IMPRESSION: Successful CT guided left iliac bone marrow aspiration and core biopsies.   Electronically Signed   By: Sandi Mariscal M.D.   On: 03/19/2015 15:05   Dg Chest Port 1 View  03/14/2015   CLINICAL DATA:  Status post fall.  History of breast cancer.  EXAM: PORTABLE CHEST - 1 VIEW  COMPARISON:  June 23, 2012  FINDINGS: The heart size and mediastinal contours are stable. There is no focal infiltrate, pulmonary edema, or pleural effusion. Left axillary surgical clips are identified unchanged. The visualized skeletal structures are stable.  IMPRESSION: No active cardiopulmonary disease.   Electronically Signed   By: Abelardo Diesel M.D.   On: 03/14/2015 18:45    Echocardiogram LV EF: 55% -  60%  ------------------------------------------------------------------- Indications:   Pericardial effusion 423.9.  ------------------------------------------------------------------- History:  Risk factors: Diabetes mellitus.  ------------------------------------------------------------------- Study Conclusions  - Left ventricle: The cavity size was normal. Wall thickness was increased in a pattern of moderate LVH. Systolic function was normal. The estimated ejection fraction was in the range of 55% to 60%. Wall motion was normal; there were no regional wall motion abnormalities. Doppler parameters are consistent with abnormal left ventricular relaxation (grade 1 diastolic dysfunction). The E/e&' ratio is >15, suggesting elevated LV filling pressure. - Aortic valve: Sclerosis without stenosis. There was no regurgitation. - Mitral valve: Calcified annulus. There was trivial regurgitation without significant respiratory variation. - Left atrium: The atrium was normal in size. - Right ventricle: The cavity size was normal. Wall thickness was normal. Systolic  function is reduced. Lateral annulus peak S velocity: 9.9 cm/s. - Tricuspid valve: There was trivial regurgitation. No significant respiratory inflow variation. - Pericardium, extracardiac: Moderate-sized circumferential pericardial effusion - measuring 1.75 cm inferiorly. Features were not consistent with tamponade physiology.  Impressions:  - Compared to the prior echo in 02/2015, there has been no change in the pericardial effusion. LVEF is lower, but still normal. There are no features of tamponade physiology.   Stress test  There was no ST segment deviation noted during stress.  The study is normal.  This is a low risk study. No ischemia.  Nuclear stress EF: 76%  Filed Weights   04/06/15 0400 04/07/15 0619 04/09/15 0500  Weight: 66 kg (145 lb 8.1 oz) 66.225 kg (146 lb) 67.359 kg (148 lb 8 oz)     Microbiology: Recent Results (from the past 240 hour(s))  Surgical pcr screen     Status: None   Collection  Time: 04/04/15 10:36 PM  Result Value Ref Range Status   MRSA, PCR NEGATIVE NEGATIVE Final   Staphylococcus aureus NEGATIVE NEGATIVE Final    Comment:        The Xpert SA Assay (FDA approved for NASAL specimens in patients over 46 years of age), is one component of a comprehensive surveillance program.  Test performance has been validated by [facility] for patients greater than or equal to 49 year old. It is not intended to diagnose infection nor to guide or monitor treatment.        Blood Culture    Component Value Date/Time   SDES BLOOD RIGHT HAND 03/31/2015 0150   SPECREQUEST BOTTLES DRAWN AEROBIC ONLY 5CC 03/31/2015 0150   CULT  03/31/2015 0150    NO GROWTH 5 DAYS Performed at [facility] 04/05/2015 FINAL 03/31/2015 0150      Labs: Results for orders placed or performed during the hospital encounter of 03/30/15 (from the past 48 hour(s))  CBC     Status: Abnormal   Collection Time: 04/09/15  5:09 AM   Result Value Ref Range   WBC 12.6 (H) 4.0 - 10.5 K/uL   RBC 2.77 (L) 3.87 - 5.11 MIL/uL   Hemoglobin 8.4 (L) 12.0 - 15.0 g/dL   HCT 27.0 (L) 36.0 - 46.0 %   MCV 97.5 78.0 - 100.0 fL   MCH 30.3 26.0 - 34.0 pg   MCHC 31.1 30.0 - 36.0 g/dL   RDW 15.6 (H) 11.5 - 15.5 %   Platelets 498 (H) 150 - 400 K/uL  Comprehensive metabolic panel     Status: Abnormal   Collection Time: 04/09/15  5:09 AM  Result Value Ref Range   Sodium 140 135 - 145 mmol/L   Potassium 4.0 3.5 - 5.1 mmol/L   Chloride 105 101 - 111 mmol/L   CO2 26 22 - 32 mmol/L   Glucose, Bld 117 (H) 65 - 99 mg/dL   BUN 5 (L) 6 - 20 mg/dL   Creatinine, Ser 0.72 0.44 - 1.00 mg/dL   Calcium 8.3 (L) 8.9 - 10.3 mg/dL   Total Protein 4.5 (L) 6.5 - 8.1 g/dL   Albumin 2.3 (L) 3.5 - 5.0 g/dL   AST 48 (H) 15 - 41 U/L   ALT 21 14 - 54 U/L   Alkaline Phosphatase 78 38 - 126 U/L   Total Bilirubin 0.4 0.3 - 1.2 mg/dL   GFR calc non Af Amer >60 >60 mL/min   GFR calc Af Amer >60 >60 mL/min    Comment: (NOTE) The eGFR has been calculated using the CKD EPI equation. This calculation has not been validated in all clinical situations. eGFR's persistently <60 mL/min signify possible Chronic Kidney Disease.    Anion gap 9 5 - 15  CBC     Status: Abnormal   Collection Time: 04/10/15  8:31 AM  Result Value Ref Range   WBC 12.3 (H) 4.0 - 10.5 K/uL   RBC 2.85 (L) 3.87 - 5.11 MIL/uL   Hemoglobin 8.6 (L) 12.0 - 15.0 g/dL   HCT 27.7 (L) 36.0 - 46.0 %   MCV 97.2 78.0 - 100.0 fL   MCH 30.2 26.0 - 34.0 pg   MCHC 31.0 30.0 - 36.0 g/dL   RDW 15.7 (H) 11.5 - 15.5 %   Platelets 634 (H) 150 - 400 K/uL  Comprehensive metabolic panel     Status: Abnormal   Collection Time: 04/10/15  8:31 AM  Result  Value Ref Range   Sodium 137 135 - 145 mmol/L   Potassium 3.6 3.5 - 5.1 mmol/L   Chloride 104 101 - 111 mmol/L   CO2 27 22 - 32 mmol/L   Glucose, Bld 89 65 - 99 mg/dL   BUN <5 (L) 6 - 20 mg/dL   Creatinine, Ser 0.72 0.44 - 1.00 mg/dL   Calcium 8.1 (L)  8.9 - 10.3 mg/dL   Total Protein 4.7 (L) 6.5 - 8.1 g/dL   Albumin 2.3 (L) 3.5 - 5.0 g/dL   AST 44 (H) 15 - 41 U/L   ALT 20 14 - 54 U/L   Alkaline Phosphatase 79 38 - 126 U/L   Total Bilirubin 0.5 0.3 - 1.2 mg/dL   GFR calc non Af Amer >60 >60 mL/min   GFR calc Af Amer >60 >60 mL/min    Comment: (NOTE) The eGFR has been calculated using the CKD EPI equation. This calculation has not been validated in all clinical situations. eGFR's persistently <60 mL/min signify possible Chronic Kidney Disease.    Anion gap 6 5 - 15  Type and screen     Status: None   Collection Time: 04/10/15  8:31 AM  Result Value Ref Range   ABO/RH(D) B POS    Antibody Screen NEG    Sample Expiration 04/13/2015      Lipid Panel     Component Value Date/Time   CHOL 110 03/31/2015 0346   TRIG 163* 03/31/2015 0346   HDL 10* 03/31/2015 0346   CHOLHDL 11.0 03/31/2015 0346   VLDL 33 03/31/2015 0346   LDLCALC 67 03/31/2015 0346     Lab Results  Component Value Date   HGBA1C 5.7* 03/31/2015     Lab Results  Component Value Date   LDLCALC 67 03/31/2015   CREATININE 0.72 04/10/2015     HPI/Brief narrative 80 year old female with history of HTN, HLD, diet-controlled DM 2, depression, MGUS, breast cancer status post mastectomy 1981, pancytopenia, hospitalized 8/25-03/21/15 for syncope at which time workup showed possible metastatic disease with lung nodules/adrenal nodules/liver lesions, admitted again to [facility] on 03/30/15 with complaints of weakness, poor oral intake, several days difficulty speaking but no unilateral weakness or visual abnormalities. Mild diarrhea. In the ED, pancytopenic and CT head shows multiple low-attenuation areas probably due to old ischemia but acute process or metastatic lesions not excluded. MRI brain _ no acute findings. She remains confused, extremely weak and poor appetite. Oncology consulted > Adrenal lesion Bx by interventional radiology was nondiagnostic - [facility]  performed splenectomy on 9/16. Patient has been stable postoperatively. Patient had Lexi scan Myoview prior to surgery. Oncology following. Patient will likely discharge to [facility] place when okay with surgery and oncology      Assessment/Plan:  Metabolic encephalopathy/Altered MS unclear etiology, patient has remained confused during this hospitalization with waxing and maintaining alertness, without any witnessed delusions hallucinations or delirium likely this is patient's new baseline MRI brain negative for metastatic disease or acute findings. Oncology cannot rule out paraneoplastic syndrome. -Infectious workup negative. Less likely to be TIA given duration of symptoms.? Related to dehydration. No gross focal neurologic deficits otherwise - HIV: neg, RPR: non reactive, TSH 4.4019 and B 12: 1453 - BCx's: neg to date. Neurologically stable postoperatively  Acute blood loss hemoglobin remained stable postoperatively without the need for any transfusion. Hemoglobin 8.6 prior to discharge   S/P laparoscopic splenectomy 04/05/15 by (Dr. Hassell Done) for doing well postoperatively, still constipated. Surgery advancing the patient's diet. Currently  on a soft diet, patient now has post splenectomy leukocytosis/thrombocytosis   Constipation-mobilize , continue constipation regimen   Post splenectomy leukocytosis/splenomegaly/status postsplenectomy/adrenal nodules Postsplenectomy leukocytosis, await further recommendations from oncology. CT-guided biopsy of adrenal gland was nondiagnostic, therefore patient underwent splenectomy on 9/16.patient initiated on stress dose steroids, IV Solu-Cortef, which was tapered off slowly and currently the patient is on hydrocortisone 10 mg twice a day Outpatient PET scan and follow-up with oncology   Coronary artery disease/pericardial effusion-stable, risk factors include advanced age, remote tobacco use, HTN, pre DM. Her EKG shows nonspecific T wave  abnormality , moderate pericardial effusion on echo that is unchanged. 2D echo with normal LVF and no RWMAs. Seen by cardiology, she has evidence of coronary artery calcifications on chest CT. she underwent Myoview on 9/15 which was negative.no evidence of pericardial tamponade. Cardiology has signed off   Dehydration with hyponatremia - Resolved,  MGUS - Oncology working up further  Chronic diastolic CHF - Not on diuretics at home. Compensated.  Diet-controlled DM 2-continue SSI  Hyperlipidemia - Continue statins  Diarrhea Resolved,  Essential hypertension Stable      Discharge Exam:    Blood pressure 148/54, pulse 91, temperature 99.1 F (37.3 C), temperature source Oral, resp. rate 20, height 5' 8"  (1.727 m), weight 67.359 kg (148 lb 8 oz), SpO2 99 %.   General exam: Pleasant elderly female lying comfortably supine in bed. Chronically ill looking. Dry oral mucosa. Respiratory system: Clear. No increased work of breathing. Cardiovascular system: S1 & S2 heard, RRR. No JVD, murmurs, gallops, clicks or pedal edema.  Gastrointestinal system: Abdomen is nondistended, soft and nontender. Normal bowel sounds heard. Central nervous system: Alert and oriented only to self. No focal neurological deficits. Extremities: Symmetric 5 x 5 power.        Follow-up Information    Follow up with [facility] [place] On 04/01/2015.   Why:  You have an appointment with the nurse to have your staples and sutures remode at 10AM, be at the office 30 minutes early for check in.   Contact information:   [place] [id number] [phone]      Follow up with Pedro Earls, MD On 04/26/2015.   Specialty:  General Surgery   Why:  Your appointment is at 11:15 AM, be there 30 minutes early for check in.   Contact information:   [address] [place] [address] [phone]       Follow up with PERINI,MARK A, MD. Schedule  an appointment as soon as possible for a visit in 3 days.   Specialty:  Internal Medicine   Contact information:   [place] [address] [phone]       Follow up with Volanda Napoleon, MD. Schedule an appointment as soon as possible for a visit in 1 week.   Specialty:  Oncology   Contact information:   [place] [address] [phone]       Signed: Reyne Dumas 04/10/2015, 1:10 PM        Time spent >45 mins

## 2015-04-10 NOTE — Progress Notes (Signed)
5 Days Post-Op  Subjective: She looks good, tolerating diet, but not eating allot.  Doesn't seem confused this AM.  Knows she is awaiting placement at [facility] place.    Objective: Vital signs in last 24 hours: Temp:  [98.7 F (37.1 C)-99.1 F (37.3 C)] 99.1 F (37.3 C) (09/21 0459) Pulse Rate:  [91-101] 91 (09/21 0459) Resp:  [18-20] 20 (09/21 0459) BP: (148-154)/(54-67) 148/54 mmHg (09/21 0459) SpO2:  [98 %-99 %] 99 % (09/21 0459) Last BM Date: 04/09/15 480 PO recorded  Soft diet 3 stools recorded Afebrile, VSS Labs stable Pathology still pending at this time Intake/Output from previous day: 09/20 0701 - 09/21 0700 In: 2680 [P.O.:480; I.V.:2200] Out: -  Intake/Output this shift:    General appearance: alert, cooperative and no distress Resp: clear to auscultation bilaterally GI: soft, sore, sites all look fine. +BS and +BM  Lab Results:   Recent Labs  04/08/15 0521 04/09/15 0509  WBC 16.1* 12.6*  HGB 8.0* 8.4*  HCT 26.3* 27.0*  PLT 398 498*    BMET  Recent Labs  04/07/15 0835 04/09/15 0509  NA 140 140  K 4.3 4.0  CL 107 105  CO2 29 26  GLUCOSE 132* 117*  BUN 12 5*  CREATININE 0.85 0.72  CALCIUM 8.3* 8.3*   PT/INR No results for input(s): LABPROT, INR in the last 72 hours.   Recent Labs Lab 04/04/15 0500 04/06/15 0430 04/07/15 0835 04/09/15 0509  AST 36 44* 38 48*  ALT 11* 18 16 21   ALKPHOS 88 74 81 78  BILITOT 0.5 0.3 0.4 0.4  PROT 5.9* 4.5* 4.9* 4.5*  ALBUMIN 2.9* 2.3* 2.5* 2.3*     Lipase     Component Value Date/Time   LIPASE 54* 03/14/2015 1905     Studies/Results: No results found.  Medications: . sodium chloride   Intravenous Once  . heparin subcutaneous  5,000 Units Subcutaneous 3 times per day  . hydrocortisone  10 mg Oral BID  . pantoprazole  40 mg Oral QHS  . polyethylene glycol  17 g Oral Daily    Assessment/Plan Pancytopenia with history of left breast cancer and some necrotic tissue removed from her left  adrenal. Laparoscopic splenectomy for splenomegaly and pancytopenia,(adrenal gland was not biopsied or removed during this procedure) 04/06/15, DR. Johnathan Hausen (adrenal  Mental status changes Pulmonary nodules/lesions Chronic diastolic heart failure Essential hypertension Hyperlipidemia Diet-controlled DM2 Antibiotics: None DVT: Heparin/SCD   Plan:  From our standpoint she can go home any time.  She needs Pneumonia, Meningococcal, and H. Influenza vaccines at 14 days post op, if she has not already had them already.  I will get her set up for staple removal in our office and information for follow up with Dr. Hassell Done in 2 weeks.    LOS: 11 days    JENNINGS,WILLARD 04/10/2015

## 2015-04-10 NOTE — Discharge Instructions (Signed)
Biopsy Care After These instructions give you information on caring for yourself after your procedure. Your doctor may also give you more specific instructions. Call your doctor if you have any problems or questions after your procedure. HOME CARE   Return to your normal diet and activities as told by your doctor.  Change your bandages (dressings) as told by your doctor. If skin glue (adhesive) was used, it will peel off in 7 days.  Only take medicines as told by your doctor.  Ask your doctor when you can bathe and get your wound wet. GET HELP RIGHT AWAY IF:  You see more than a small spot of blood coming from the wound.  You have redness, puffiness (swelling), or pain.  You see yellowish-white fluid (pus) coming from the wound.  You have a fever.  You notice a bad smell coming from the wound or bandage.  You have a rash, trouble breathing, or any allergy problems. MAKE SURE YOU:   Understand these instructions.  Will watch your condition.  Will get help right away if you are not doing well or get worse. Document Released: 03/10/2011 Document Revised: 09/28/2011 Document Reviewed: 03/10/2011 [facility] Patient Information 2015 [place], [place]. This information is not intended to replace advice given to you by your health care provider. Make sure you discuss any questions you have with your health care provider.  [facility], [place] [phone]  OPEN ABDOMINAL SURGERY: POST OP INSTRUCTIONS  Always review your discharge instruction sheet given to you by the facility where your surgery was performed.  IF YOU HAVE DISABILITY OR FAMILY LEAVE FORMS, YOU MUST BRING THEM TO THE OFFICE FOR PROCESSING.  PLEASE DO NOT GIVE THEM TO YOUR DOCTOR.  1. A prescription for pain medication may be given to you upon discharge.  Take your pain medication as prescribed, if needed.  If narcotic pain medicine is not needed, then you may take acetaminophen (Tylenol) or ibuprofen  (Advil) as needed. 2. Take your usually prescribed medications unless otherwise directed. 3. If you need a refill on your pain medication, please contact your pharmacy. They will contact our office to request authorization.  Prescriptions will not be filled after 5pm or on week-ends. 4. You should follow a light diet the first few days after arrival home, such as soup and crackers, pudding, etc.unless your doctor has advised otherwise. A high-fiber, low fat diet can be resumed as tolerated.   Be sure to include lots of fluids daily. Most patients will experience some swelling and bruising on the chest and neck area.  Ice packs will help.  Swelling and bruising can take several days to resolve 5. Most patients will experience some swelling and bruising in the area of the incision. Ice pack will help. Swelling and bruising can take several days to resolve..  6. It is common to experience some constipation if taking pain medication after surgery.  Increasing fluid intake and taking a stool softener will usually help or prevent this problem from occurring.  A mild laxative (Milk of Magnesia or Miralax) should be taken according to package directions if there are no bowel movements after 48 hours. 7.  You may have steri-strips (small skin tapes) in place directly over the incision.  These strips should be left on the skin for 7-10 days.  If your surgeon used skin glue on the incision, you may shower in 24 hours.  The glue will flake off over the next 2-3 weeks.  Any sutures or staples will be removed at the office during your follow-up visit. You may find that a light gauze bandage over your incision may keep your staples from being rubbed or pulled. You may shower and replace the bandage daily. 8. ACTIVITIES:  You may resume regular (light) daily activities beginning the next day--such as daily self-care, walking, climbing stairs--gradually increasing activities as tolerated.  You may have sexual intercourse when  it is comfortable.  Refrain from any heavy lifting or straining until approved by your doctor. a. You may drive when you no longer are taking prescription pain medication, you can comfortably wear a seatbelt, and you can safely maneuver your car and apply brakes b. Return to Work: ___________________________________ 83. You should see your doctor in the office for a follow-up appointment approximately two weeks after your surgery.  Make sure that you call for this appointment within a day or two after you arrive home to insure a convenient appointment time. OTHER INSTRUCTIONS:  _____________________________________________________________ _____________________________________________________________  WHEN TO CALL YOUR DOCTOR: 1. Fever over 101.0 2. Inability to urinate 3. Nausea and/or vomiting 4. Extreme swelling or bruising 5. Continued bleeding from incision. 6. Increased pain, redness, or drainage from the incision. 7. Difficulty swallowing or breathing 8. Muscle cramping or spasms. 9. Numbness or tingling in hands or feet or around lips.  The clinic staff is available to answer your questions during regular business hours.  Please dont hesitate to call and ask to speak to one of the nurses if you have concerns.  For further questions, please visit [email or web address]

## 2015-04-10 NOTE — Progress Notes (Signed)
Physical Therapy Treatment Patient Details Name: [name] [name] MRN: [id number] DOB: Mar 26, 1935 Today's Date: 04/12/15    History of Present Illness 80 y/o female who was admitted on 03/30/15 for mental status changes, weakness, difficulty speaking. She has a PMH significant for MGUS, hospitalized 8/25-03/21/15 for syncope at which time workup showed possible metastatic disease with lung nodules/adrenal nodules/liver lesions, pancytopenia, diastolic CHF, DM2, HLD, and depression.  Pt s/p laparoscopic splenectomy 04/05/15     PT Comments    Pt ambulated in hallway however more unsteady today then yesterday.  Pt hopeful for d/c soon.  Follow Up Recommendations  SNF;Supervision - Intermittent     Equipment Recommendations  Rolling walker with 5" wheels    Recommendations for Other Services       Precautions / Restrictions Precautions Precautions: Fall    Mobility  Bed Mobility Overal bed mobility: Needs Assistance Bed Mobility: Supine to Sit     Supine to sit: Supervision;HOB elevated        Transfers Overall transfer level: Needs assistance Equipment used: None Transfers: Sit to/from Stand Sit to Stand: Min guard         General transfer comment: min/guard for safety  Ambulation/Gait Ambulation/Gait assistance: Min guard Ambulation Distance (Feet): 200 Feet Assistive device: None (pushed IV pole) Gait Pattern/deviations: Step-through pattern;Decreased stride length     General Gait Details: pt pushed IV pole, fatigues quickly however, more unsteady today compared to yesterday however no overt LOB   Stairs            Wheelchair Mobility    Modified Rankin (Stroke Patients Only)       Balance                                    Cognition Arousal/Alertness: Awake/alert Behavior During Therapy: WFL for tasks assessed/performed Overall Cognitive Status: Within Functional Limits for tasks assessed                      Exercises       General Comments        Pertinent Vitals/Pain Pain Assessment: No/denies pain    Home Living                      Prior Function            PT Goals (current goals can now be found in the care plan section) Progress towards PT goals: Progressing toward goals    Frequency  Min 3X/week    PT Plan Current plan remains appropriate    Co-evaluation             End of Session   Activity Tolerance: Patient tolerated treatment well Patient left: Other (comment) (left in bathroom on toilet, aware to use pull cord for assist up)     Time: 1100-1109 PT Time Calculation (min) (ACUTE ONLY): 9 min  Charges:  $Gait Training: 8-22 mins                    G Codes:      LEMYRE,KATHrine E 04/12/15, 1:12 PM Carmelia Bake, PT, DPT 04/12/2015 Pager: [phone]

## 2015-04-10 NOTE — Progress Notes (Signed)
CSW continuing to follow.   Per MD, anticipate discharge tomorrow.   CSW spoke with pt daughter who reports pt first choice would be [facility] and pt secondary choice would be [facility].   CSW contacted [facility] and facility stated that they could not extend a bed offer to pt.   CSW contacted [facility] and facility has bed available for pt tomorrow.   CSW discussed with pt daughter, [name] via telephone and notified pt daughter that [facility] could not offer and [facility] can offer a pt a bed tomorrow. Pt daughter agreeable and stated that she would notify pt.   CSW notified [facility] of acceptance of bed offer and [place] confirmed that facility can accept pt tomorrow.  Alison Murray, MSW, [place] [facility] [phone]

## 2015-04-11 DIAGNOSIS — D649 Anemia, unspecified: Secondary | ICD-10-CM | POA: Insufficient documentation

## 2015-04-11 LAB — COMPREHENSIVE METABOLIC PANEL
ALBUMIN: 2.2 g/dL — AB (ref 3.5–5.0)
ALK PHOS: 72 U/L (ref 38–126)
ALT: 17 U/L (ref 14–54)
ANION GAP: 7 (ref 5–15)
AST: 47 U/L — ABNORMAL HIGH (ref 15–41)
BUN: <5 mg/dL — ABNORMAL LOW (ref 6–20)
CALCIUM: 8.2 mg/dL — AB (ref 8.9–10.3)
CO2: 26 mmol/L (ref 22–32)
Chloride: 105 mmol/L (ref 101–111)
Creatinine, Ser: 0.73 mg/dL (ref 0.44–1.00)
GFR calc Af Amer: >60 mL/min (ref >60)
GFR calc non Af Amer: >60 mL/min (ref >60)
GLUCOSE: 94 mg/dL (ref 65–99)
POTASSIUM: 4.2 mmol/L (ref 3.5–5.1)
SODIUM: 138 mmol/L (ref 135–145)
Total Bilirubin: 0.6 mg/dL (ref 0.3–1.2)
Total Protein: 4.4 g/dL — ABNORMAL LOW (ref 6.5–8.1)

## 2015-04-11 LAB — CBC
HCT: 24.6 % — ABNORMAL LOW (ref 36.0–46.0)
HCT: 36.4 % (ref 36.0–46.0)
HEMOGLOBIN: 8 g/dL — AB (ref 12.0–15.0)
Hemoglobin: 12.4 g/dL (ref 12.0–15.0)
MCH: 31.9 pg (ref 26.0–34.0)
MCH: 31.8 pg (ref 26.0–34.0)
MCHC: 32.5 g/dL (ref 30.0–36.0)
MCHC: 34.1 g/dL (ref 30.0–36.0)
MCV: 98 fL (ref 78.0–100.0)
MCV: 93.3 fL (ref 78.0–100.0)
PLATELETS: 605 10*3/uL — AB (ref 150–400)
Platelets: 369 10*3/uL (ref 150–400)
RBC: 2.51 MIL/uL — ABNORMAL LOW (ref 3.87–5.11)
RBC: 3.9 MIL/uL (ref 3.87–5.11)
RDW: 15.8 % — ABNORMAL HIGH (ref 11.5–15.5)
RDW: 15.9 % — AB (ref 11.5–15.5)
WBC: 12.9 10*3/uL — ABNORMAL HIGH (ref 4.0–10.5)
WBC: 18.2 10*3/uL — AB (ref 4.0–10.5)

## 2015-04-11 LAB — PREPARE RBC (CROSSMATCH)

## 2015-04-11 LAB — IRON AND TIBC
Iron: 16 ug/dL — ABNORMAL LOW (ref 28–170)
SATURATION RATIOS: 7 % — AB (ref 10.4–31.8)
TIBC: 225 ug/dL — AB (ref 250–450)
UIBC: 209 ug/dL

## 2015-04-11 LAB — FERRITIN: FERRITIN: 181 ng/mL (ref 11–307)

## 2015-04-11 MED ORDER — FUROSEMIDE 10 MG/ML IJ SOLN
20.0000 mg | Freq: Once | INTRAMUSCULAR | Status: AC
  Administered 2015-04-11: 20 mg via INTRAVENOUS
  Filled 2015-04-11: qty 2

## 2015-04-11 MED ORDER — ACETAMINOPHEN 325 MG PO TABS
650.0000 mg | ORAL_TABLET | Freq: Once | ORAL | Status: AC
  Administered 2015-04-11: 650 mg via ORAL

## 2015-04-11 MED ORDER — HYDROCORTISONE 10 MG PO TABS
10.0000 mg | ORAL_TABLET | Freq: Every day | ORAL | Status: DC
  Administered 2015-04-12: 10 mg via ORAL
  Filled 2015-04-11: qty 1

## 2015-04-11 MED ORDER — HYDROCORTISONE 20 MG PO TABS
20.0000 mg | ORAL_TABLET | Freq: Every day | ORAL | Status: DC
  Administered 2015-04-11: 20 mg via ORAL
  Filled 2015-04-11: qty 1

## 2015-04-11 MED ORDER — HYDROCORTISONE 10 MG PO TABS
10.0000 mg | ORAL_TABLET | Freq: Every day | ORAL | Status: DC
  Filled 2015-04-11: qty 1

## 2015-04-11 MED ORDER — SODIUM CHLORIDE 0.9 % IV SOLN
Freq: Once | INTRAVENOUS | Status: AC
  Administered 2015-04-11: 09:00:00 via INTRAVENOUS

## 2015-04-11 MED ORDER — FOLIC ACID 1 MG PO TABS
2.0000 mg | ORAL_TABLET | Freq: Every day | ORAL | Status: DC
  Administered 2015-04-11 – 2015-04-12 (×2): 2 mg via ORAL
  Filled 2015-04-11 (×2): qty 2

## 2015-04-11 MED ORDER — DIPHENHYDRAMINE HCL 25 MG PO CAPS
25.0000 mg | ORAL_CAPSULE | Freq: Once | ORAL | Status: AC
  Administered 2015-04-11: 25 mg via ORAL
  Filled 2015-04-11: qty 1

## 2015-04-11 NOTE — Progress Notes (Signed)
OT Cancellation Note  Patient Details Name: [name] [name] MRN: [id number] DOB: 1934-08-25   Cancelled Treatment:    Reason Eval/Treat Not Completed: Other (comment)  Pt fell earlier and will be getting blood. Will try to return tomorrow.  SPENCER,MARYELLEN 04/11/2015, 9:38 AM  Lesle Chris, OTR/L [phone] 04/11/2015

## 2015-04-11 NOTE — Progress Notes (Signed)
6 Days Post-Op  Subjective: She looks fine, she is still draining some serosanguinous fluid out of that left lateral incision.  We opened it and did not fine any real bleeders 2 days ago.  She is doing well with diet and + BM.  Objective: Vital signs in last 24 hours: Temp:  [98.6 F (37 C)-99.9 F (37.7 C)] 99.9 F (37.7 C) (09/22 0900) Pulse Rate:  [91-111] 98 (09/22 0900) Resp:  [18-20] 20 (09/22 0900) BP: (113-174)/(50-73) 153/73 mmHg (09/22 0900) SpO2:  [97 %-100 %] 100 % (09/22 0900) Last BM Date: 04/10/15 TM 99.5 Afebrile, VSS 840 PO recorded + BM Soft diet  Intake/Output from previous day: 09/21 0701 - 09/22 0700 In: 2161.7 [P.O.:840; I.V.:1321.7] Out: -  Intake/Output this shift: Total I/O In: 250 [I.V.:250] Out: -   General appearance: alert, cooperative and no distress GI: soft, sore, sites OK, she is still draining some fluid from the left lateral site.  Lab Results:   Recent Labs  04/10/15 0831 04/11/15 0506  WBC 12.3* 12.9*  HGB 8.6* 8.0*  HCT 27.7* 24.6*  PLT 634* 369    BMET  Recent Labs  04/10/15 0831 04/11/15 0506  NA 137 138  K 3.6 4.2  CL 104 105  CO2 27 26  GLUCOSE 89 94  BUN <5* <5*  CREATININE 0.72 0.73  CALCIUM 8.1* 8.2*   PT/INR No results for input(s): LABPROT, INR in the last 72 hours.   Recent Labs Lab 04/06/15 0430 04/07/15 0835 04/09/15 0509 04/10/15 0831 04/11/15 0506  AST 44* 38 48* 44* 47*  ALT 18 16 21 20 17   ALKPHOS 74 81 78 79 72  BILITOT 0.3 0.4 0.4 0.5 0.6  PROT 4.5* 4.9* 4.5* 4.7* 4.4*  ALBUMIN 2.3* 2.5* 2.3* 2.3* 2.2*     Lipase     Component Value Date/Time   LIPASE 54* 03/14/2015 1905     Studies/Results: No results found.  Medications: . sodium chloride   Intravenous Once  . folic acid  2 mg Oral Daily  . furosemide  20 mg Intravenous Once  . heparin subcutaneous  5,000 Units Subcutaneous 3 times per day  . hydrocortisone  10 mg Oral BID  . pantoprazole  40 mg Oral QHS  .  polyethylene glycol  17 g Oral Daily    Assessment/Plan Pancytopenia with history of left breast cancer and some necrotic tissue removed from her left adrenal. Laparoscopic splenectomy for splenomegaly and pancytopenia,(adrenal gland was not biopsied or removed during this procedure) 04/06/15, DR. Johnathan Hausen Adrenal nodules Mental status changes Pulmonary nodules/lesions Chronic diastolic heart failure Essential hypertension Hyperlipidemia Diet-controlled DM2 Antibiotics: None DVT: Heparin/SCD  Plan:  She is ok to go from our standpoint.   Dr. Marin Olp is recommending she be transfused before going to SNF We have her set up for follow in our office and appointments are in [facility]. She needs:  Pneumonia, Meningococcal, and H. Influenza vaccines at 14 days post op, if she has not already had them already.  I will defer to Medicine on this.     LOS: 12 days    [name] [name] 04/11/2015

## 2015-04-11 NOTE — Progress Notes (Signed)
Patient fell in the floor while helping her go to the bathroom, patient stated she didn't feel any dizziness before she fell, patient is alert and oriented, Patient has Bruises on her left hip and elbow, . N.P. Informed. family informed. RN put icepack on the her left hip and elbow, V/S are normal, Will continue to monitor the patient.

## 2015-04-11 NOTE — Progress Notes (Addendum)
PROGRESS NOTE    [name] [name] SWH:[id number] DOB: Jul 12, 1935 DOA: 03/30/2015 PCP: Jerlyn Ly, MD  HPI/Brief narrative 80 year old female with history of HTN, HLD, diet-controlled DM 2, depression, MGUS, breast cancer status post mastectomy 1981, pancytopenia, hospitalized 8/25-03/21/15 for syncope at which time workup showed possible metastatic disease with lung nodules/adrenal nodules/liver lesions, admitted again to [facility] on 03/30/15 with complaints of weakness, poor oral intake, several days difficulty speaking but no unilateral weakness or visual abnormalities. Mild diarrhea. In the ED, pancytopenic and CT head shows multiple low-attenuation areas probably due to old ischemia but acute process or metastatic lesions not excluded. MRI brain _ no acute findings. She remains confused, extremely weak and poor appetite. Oncology consulted > Adrenal lesion Bx by interventional radiology was nondiagnostic - [facility] performed splenectomy on 9/16. Patient has been stable postoperatively. Patient had Lexi scan Myoview prior to surgery. Oncology following. Patient will likely discharge to [facility] 9/23.   Assessment/Plan:  Metabolic encephalopathy/Altered MS  - unclear etiology, patient has remained confused during this hospitalization with waxing and maintaining alertness, without any witnessed delusions hallucinations or delirium - likely this is patient's new baseline - MRI brain negative for metastatic disease or acute findings. Oncology cannot rule out paraneoplastic syndrome. - Infectious workup negative. Less likely to be TIA given duration of symptoms.? Related to dehydration. No gross focal neurologic deficits otherwise - HIV: neg, RPR: non reactive, TSH 4.4019 and B 12: 1453 - BCx's: neg to date. - Neurologically stable postoperatively  Acute blood loss  - hemoglobin remained stable postoperatively without the need for any transfusion. However Hb on 9/22:8 g per DL. As per hematology,  receiving transfusion of 2 units of PRBC. Follow CBCs.   S/P laparoscopic splenectomy 04/05/15 by (Dr. Hassell Done)  - doing well postoperatively, still constipated. Surgery advancing the patient's diet. - Currently on a soft diet, patient now has post splenectomy leukocytosis/thrombocytosis - She needs: Pneumonia, Meningococcal, and H. Influenza vaccines at 14 days post op, if she has not already had them already.  Constipation-mobilize , continue constipation regimen  Post splenectomy leukocytosis/splenomegaly/status postsplenectomy/adrenal nodules Postsplenectomy leukocytosis, await further recommendations from oncology. CT-guided biopsy of adrenal gland was nondiagnostic, therefore patient underwent splenectomy on 9/16.patient initiated on stress dose steroids, IV Solu-Cortef, which was tapered off slowly and currently the patient is on hydrocortisone 10 mg twice a day Outpatient PET scan and follow-up with oncology   Coronary artery disease/pericardial effusion-stable,  - risk factors include advanced age, remote tobacco use, HTN, pre DM. Her EKG shows nonspecific T wave abnormality , moderate pericardial effusion on echo that is unchanged. 2D echo with normal LVF and no RWMAs. Seen by cardiology, she has evidence of coronary artery calcifications on chest CT. she underwent Myoview on 9/15 which was negative.no evidence of pericardial tamponade. Cardiology has signed off   Dehydration with hyponatremia - Resolved,  MGUS - Oncology working up further  Chronic diastolic CHF - Not on diuretics at home. Compensated.  Diet-controlled DM 2 -continue SSI  Hyperlipidemia - Continue statins  Diarrhea Resolved,  Essential hypertension Stable  Fall on 9/22 - Witnessed fall. Initially complained of mild soreness of left hip, left shoulder and left elbow to evaluating APP but currently without symptoms. Monitor   DVT prophylaxis: Subcutaneous heparin Code Status: Full Family  Communication: None at bedside Disposition Plan: DC to [facility] 9/23   Consultants:  Hematology/oncology  Cardiology  General surgery  Procedures:  Splenectomy  Adrenal biopsy by IR  Antibiotics:  None  Subjective: Denies complaints. As per nursing, no acute events. Blood transfusion ongoing. Overnight/early morning events noted-witnessed fall  Objective: Filed Vitals:   04/11/15 1115 04/11/15 1230 04/11/15 1255 04/11/15 1505  BP: 149/70 145/65 145/53 130/48  Pulse: 101 99 96 102  Temp: 99.5 F (37.5 C) 98.3 F (36.8 C) 99.3 F (37.4 C) 98.5 F (36.9 C)  TempSrc: Oral Oral Oral Oral  Resp: 20 20 20 20   Height:      Weight:      SpO2: 99% 100% 100% 100%    Intake/Output Summary (Last 24 hours) at 04/11/15 1631 Last data filed at 04/11/15 1300  Gross per 24 hour  Intake 2636.66 ml  Output      0 ml  Net 2636.66 ml   Filed Weights   04/06/15 0400 04/07/15 0619 04/09/15 0500  Weight: 66 kg (145 lb 8.1 oz) 66.225 kg (146 lb) 67.359 kg (148 lb 8 oz)     Exam:  General exam: Pleasant elderly female sitting up comfortably in bed. Looks much better than she did a week ago. Respiratory system: Clear. No increased work of breathing. Cardiovascular system: S1 & S2 heard, RRR. No JVD, murmurs, gallops, clicks or pedal edema. Telemetry: Sinus rhythm Gastrointestinal system: Abdomen is nondistended, soft and nontender. Surgical site staples clean and dry. Normal bowel sounds heard. Central nervous system: Alert and oriented. No focal neurological deficits. Extremities: Symmetric 5 x 5 power.   Data Reviewed: Basic Metabolic Panel:  Recent Labs Lab 04/06/15 0430 04/07/15 0835 04/09/15 0509 04/10/15 0831 04/11/15 0506  NA 139 140 140 137 138  K 4.2 4.3 4.0 3.6 4.2  CL 106 107 105 104 105  CO2 29 29 26 27 26   GLUCOSE 160* 132* 117* 89 94  BUN 16 12 5* <5* <5*  CREATININE 0.90 0.85 0.72 0.72 0.73  CALCIUM 7.9* 8.3* 8.3* 8.1* 8.2*   Liver Function  Tests:  Recent Labs Lab 04/06/15 0430 04/07/15 0835 04/09/15 0509 04/10/15 0831 04/11/15 0506  AST 44* 38 48* 44* 47*  ALT 18 16 21 20 17   ALKPHOS 74 81 78 79 72  BILITOT 0.3 0.4 0.4 0.5 0.6  PROT 4.5* 4.9* 4.5* 4.7* 4.4*  ALBUMIN 2.3* 2.5* 2.3* 2.3* 2.2*   No results for input(s): LIPASE, AMYLASE in the last 168 hours. No results for input(s): AMMONIA in the last 168 hours. CBC:  Recent Labs Lab 04/06/15 0430  04/07/15 2348 04/08/15 0521 04/09/15 0509 04/10/15 0831 04/11/15 0506  WBC 9.1  < > 16.3* 16.1* 12.6* 12.3* 12.9*  NEUTROABS 7.5  --   --   --   --   --   --   HGB 7.6*  < > 8.2* 8.0* 8.4* 8.6* 8.0*  HCT 23.8*  < > 26.1* 26.3* 27.0* 27.7* 24.6*  MCV 93.7  < > 96.7 96.3 97.5 97.2 98.0  PLT 214  < > 373 398 498* 634* 369  < > = values in this interval not displayed. Cardiac Enzymes: No results for input(s): CKTOTAL, CKMB, CKMBINDEX, TROPONINI in the last 168 hours. BNP (last 3 results) No results for input(s): PROBNP in the last 8760 hours. CBG:  Recent Labs Lab 04/05/15 0755 04/05/15 1430  GLUCAP 125* 129*    Recent Results (from the past 240 hour(s))  Surgical pcr screen     Status: None   Collection Time: 04/04/15 10:36 PM  Result Value Ref Range Status   MRSA, PCR NEGATIVE NEGATIVE Final   Staphylococcus aureus NEGATIVE NEGATIVE Final  Comment:        The Xpert SA Assay (FDA approved for NASAL specimens in patients over 22 years of age), is one component of a comprehensive surveillance program.  Test performance has been validated by [facility] for patients greater than or equal to 21 year old. It is not intended to diagnose infection nor to guide or monitor treatment.            Studies: No results found.      Scheduled Meds: . sodium chloride   Intravenous Once  . folic acid  2 mg Oral Daily  . heparin subcutaneous  5,000 Units Subcutaneous 3 times per day  . hydrocortisone  10 mg Oral QHS  . hydrocortisone  20 mg  Oral Daily  . pantoprazole  40 mg Oral QHS  . polyethylene glycol  17 g Oral Daily   Continuous Infusions: . dextrose 5 % and 0.45 % NaCl with KCl 20 mEq/L 50 mL/hr at 04/11/15 1751    Principal Problem:   S/P laparoscopic splenectomy Active Problems:   MGUS (monoclonal gammopathy of unknown significance)   Adrenal nodule   Chronic diastolic heart failure   Pulmonary nodules/lesions, multiple   DM type 2 (diabetes mellitus, type 2)   Pancytopenia   Generalized weakness   HLD (hyperlipidemia)   Depression   Slurred speech   Difficulty speaking   Protein calorie malnutrition   Diarrhea   Pericardial effusion   Coronary artery calcification seen on CAT scan    Time spent: 20 minutes    HONGALGI,ANAND, MD, FACP, FHM. [facility] Pager [phone]  If 7PM-7AM, please contact night-coverage [email or web address] Password [email or web address] 04/11/2015, 4:31 PM    LOS: 12 days

## 2015-04-11 NOTE — Progress Notes (Signed)
CSW continuing to follow.   CSW spoke with Dr. Marin Olp RN who stated that Dr. Marin Olp preferred that pt remain in the hospital until tomorrow, but stated that final decision is attending MD about discharge.  CSW notified attending MD and attending MD states plan for discharge tomorrow.   CSW contacted [facility] and facility confirmed bed availability for tomorrow.   CSW contacted pt daughter, [name] via telephone and updated pt daughter.   CSW to continue to follow to assist with pt discharge to [facility] when medically ready for discharge.  Alison Murray, MSW, [place] [facility] [phone]

## 2015-04-11 NOTE — Progress Notes (Signed)
Lindenbaum had a falling episode early this morning. I think some of this might be from her hemoglobin being 8. I think that we will have to transfuse her. I think 2 units of blood will help her out tremendously.  The final pathology report is still pending.  She does not hurt. She's had no further bleeding over on the left side.  She is not going to go to [facility]. I'm not sure where she will be going for some rehabilitation.  Her appetite seems to be improving a little bit.  She is going to the bathroom. There is no diarrhea.  She's had no chest wall pain. There's been no cough.  On her physical exam, she does have a low-grade temperature 99.5. Pulse is 98. Blood pressure 113/59. Her lungs sound clear bilaterally. She has no wheezes. Cardiac exam regular rate and rhythm with no murmurs, rubs or bruits. Abdomen is soft. Bowel sounds are slightly decreased. She still has a abdominal wound dressing over on the left side. Extremities shows no clubbing, cyanosis or edema.  Her labs show her white count B 12.9. Hemoglobin 8. Bili count 369. Her BUN is less than 5 creatinine 0.73. Albumin is only 2.2.  Again, I think 2 units of blood will help her out. We will go ahead and get this ordered today.  I want to see what her iron studies show.  I think she will benefit from some folic acid.  As always, she is getting wonderful care up on [address]. I appreciate everybody's efforts.   [name].  2 Cor  12:9-10

## 2015-04-12 ENCOUNTER — Other Ambulatory Visit: Payer: Self-pay | Admitting: Hematology & Oncology

## 2015-04-12 ENCOUNTER — Telehealth: Payer: Self-pay | Admitting: Hematology & Oncology

## 2015-04-12 DIAGNOSIS — D472 Monoclonal gammopathy: Secondary | ICD-10-CM

## 2015-04-12 DIAGNOSIS — R627 Adult failure to thrive: Secondary | ICD-10-CM

## 2015-04-12 DIAGNOSIS — D500 Iron deficiency anemia secondary to blood loss (chronic): Secondary | ICD-10-CM

## 2015-04-12 DIAGNOSIS — D696 Thrombocytopenia, unspecified: Secondary | ICD-10-CM

## 2015-04-12 LAB — TYPE AND SCREEN
ABO/RH(D): B POS
Antibody Screen: NEGATIVE
UNIT DIVISION: 0
Unit division: 0

## 2015-04-12 LAB — CBC
HEMATOCRIT: 39.1 % (ref 36.0–46.0)
HEMOGLOBIN: 13 g/dL (ref 12.0–15.0)
MCH: 31 pg (ref 26.0–34.0)
MCHC: 33.2 g/dL (ref 30.0–36.0)
MCV: 93.3 fL (ref 78.0–100.0)
Platelets: 624 10*3/uL — ABNORMAL HIGH (ref 150–400)
RBC: 4.19 MIL/uL (ref 3.87–5.11)
RDW: 16.3 % — ABNORMAL HIGH (ref 11.5–15.5)
WBC: 17.2 10*3/uL — ABNORMAL HIGH (ref 4.0–10.5)

## 2015-04-12 MED ORDER — ACETAMINOPHEN 325 MG PO TABS: 650.0000 mg | ORAL_TABLET | Freq: Four times a day (QID) | ORAL | Status: AC | PRN

## 2015-04-12 MED ORDER — SODIUM CHLORIDE 0.9 % IV SOLN
510.0000 mg | Freq: Once | INTRAVENOUS | Status: AC
  Administered 2015-04-12: 510 mg via INTRAVENOUS
  Filled 2015-04-12: qty 17

## 2015-04-12 MED ORDER — FOLIC ACID 1 MG PO TABS: 2.0000 mg | ORAL_TABLET | Freq: Every day | ORAL | Status: AC

## 2015-04-12 MED ORDER — ONDANSETRON 4 MG PO TBDP: 4.0000 mg | ORAL_TABLET | Freq: Three times a day (TID) | ORAL | Status: AC | PRN

## 2015-04-12 NOTE — Progress Notes (Signed)
Ms. Stumpo is doing okay as morning. She got 2 units of blood yesterday. At his helped her out quite a bit.  The final pathology is not yet back on the spleen. 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 I'm not sure is why she would have this. As such, as now patient, we will will work her up for a myeloproliferative neoplasm. I would think though that we would've seen this on her bone marrow test.  I still don't know what is going on with the adrenal glands or in the lung. We're going to have to watch this carefully.  She is going to assisted living. She will get some rehabilitation.  It is clear that her iron studies are low. She needs IV iron. However, her IV has been taken out and I would not want to have no one put in her. We can certainly do iron in my office.  She will need to be on folic acid.  She's had no bleeding.  She is going to the bathroom okay.  She's had no rashes.  Is no shortness of breath.  On her physical exam, her vital signs are pretty good. Her blood pressure is 157/79. Her heart rate is 104. On the monitor, looks that she has some premature beats. It does not look like atrial fibrillation. Her lungs are clear. Cardiac exam regular rate and rhythm with no murmurs, rubs or bruits. Abdomen is soft. She still has a dressing on the laparotomy site. She has no obvious fluid wave. There is no hepatomegaly. Extremities shows no clubbing, cyanosis or edema.  Her labs show a white cell count of 17,000. Hemoglobin 13. Platelet count 624K.  She will be discharged today. We will follow-up with her as an outpatient.  As far as any further workup for the adrenal gland or lung, type I would hold off on this for at least 4-6 weeks. She has been through so much. I really think we can wait that long so that we can allow her to get her strength back.  As always, we had a good  prayer session.  [name].  1 Thessalonians 5:16-18

## 2015-04-12 NOTE — Progress Notes (Signed)
Pt complains of nausea this morning after breakfast. Given Zofran SL which seems to help. Pt saline lock not working. IV team consult order placed for replacement. Same was cancelled after consulting with Dr. Lindaann Pascal.

## 2015-04-12 NOTE — Progress Notes (Signed)
Occupational Therapy Treatment Patient Details Name: [name] [name] MRN: [id number] DOB: 03-Sep-1934 Today's Date: 04/12/2015    History of present illness 80 y/o female who was admitted on 03/30/15 for mental status changes, weakness, difficulty speaking. She has a PMH significant for MGUS, hospitalized 8/25-03/21/15 for syncope at which time workup showed possible metastatic disease with lung nodules/adrenal nodules/liver lesions, pancytopenia, diastolic CHF, DM2, HLD, and depression.  Pt s/p laparoscopic splenectomy 04/05/15    OT comments  Pt needed safety cues and min guard to min A during OT session.    Follow Up Recommendations  SNF    Equipment Recommendations  3 in 1 bedside comode    Recommendations for Other Services      Precautions / Restrictions Precautions Precautions: Fall       Mobility Bed Mobility         Supine to sit: Supervision;HOB elevated     General bed mobility comments: bed rail was up, and pt got up at bottom of bed around this; bed alarm was set  Transfers   Equipment used: Rolling walker (2 wheeled) Transfers: Sit to/from Stand Sit to Stand: Min guard         General transfer comment: cues for safety; hand placement    Balance                                   ADL       Grooming: Min guard;Standing                   Toilet Transfer: Minimal assistance;Ambulation;Comfort height toilet;Grab bars             General ADL Comments: ambulated to bathroom to use commode; cues for safety with RW for distance from body, turning completely with her and hand placement. She needed cues not to push up on trash can from EOB when rising      Vision                     Perception     Praxis      Cognition   Behavior During Therapy: [facility] for tasks assessed/performed Overall Cognitive Status: No family/caregiver present to determine baseline cognitive functioning                  General Comments:  decreased safety; pt stated she wanted to sit in chair and sat on bed instead    Extremity/Trunk Assessment               Exercises     Shoulder Instructions       General Comments      Pertinent Vitals/ Pain       Pain Assessment: No/denies pain  Home Living                                          Prior Functioning/Environment              Frequency Min 2X/week     Progress Toward Goals  OT Goals(current goals can now be found in the care plan section)  Progress towards OT goals: Progressing toward goals     Plan      Co-evaluation                 End  of Session     Activity Tolerance Patient tolerated treatment well   Patient Left in chair;with call bell/phone within reach;with chair alarm set   Nurse Communication          Time: [phone] OT Time Calculation (min): 17 min  Charges: OT General Charges $OT Visit: 1 Procedure OT Treatments $Self Care/Home Management : 8-22 mins  SPENCER,MARYELLEN 04/12/2015, 9:37 AM  Lesle Chris, OTR/L [phone] 04/12/2015

## 2015-04-12 NOTE — Progress Notes (Signed)
Pt discharge with daughter to [facility]. All property with patient. Report given to [facility], Therapist, sports. Follow appointments given to daughter.

## 2015-04-12 NOTE — Discharge Summary (Signed)
Physician Discharge Summary  [name] [name] GQQ:[id number] DOB: 01-06-1935 DOA: 03/30/2015  PCP: [name] Ly, MD  Admit date: 03/30/2015 Discharge date: 04/12/2015  Time spent: Greater than 30 minutes  Recommendations for Outpatient Follow-up:  1. M.D. At SNF in 3 days with repeat labs (CBC & CMP). 2. [name] [name], PCP upon discharge from SNF 3. [name] [name], Hematology/Oncology: SNF to coordinate follow-up appointment 4. [facility] on 04/02/2015 at 10 AM for removal of staples/sutures. Patient has to come 30 minutes early for check in. 5. Dr. Johnathan [name], general surgery on 04/26/15 at 11:15 AM. Patient has to come 30 minutes early for checking 6. Follow-up pathology report from splenectomy that was done in the hospital. 7. Patient will need pneumococcal and H influenza vaccines at 14 days postop if she has not already had them (splenectomy date 04/05/15- so will be 04/19/2015). This can be coordinated with patient's PCP. Patient did receive meningococcal vaccine on 04/03/15.  Discharge Diagnoses:  Principal Problem:   S/P laparoscopic splenectomy Active Problems:   MGUS (monoclonal gammopathy of unknown significance)   Adrenal nodule   Chronic diastolic heart failure   Pulmonary nodules/lesions, multiple   DM type 2 (diabetes mellitus, type 2)   Pancytopenia   Generalized weakness   HLD (hyperlipidemia)   Depression   Slurred speech   Difficulty speaking   Protein calorie malnutrition   Diarrhea   Pericardial effusion   Coronary artery calcification seen on CAT scan   Absolute anemia   Discharge Condition: Improved & Stable  Diet recommendation: Soft diet.  Filed Weights   04/07/15 0619 04/09/15 0500 04/12/15 0546  Weight: 66.225 kg (146 lb) 67.359 kg (148 lb 8 oz) 64.955 kg (143 lb 3.2 oz)    History of present illness:  80 year old female with history of HTN, HLD, diet-controlled DM 2, depression, MGUS, breast cancer status post mastectomy 1981,  pancytopenia, hospitalized 8/25-03/21/15 for syncope at which time workup showed possible metastatic disease with lung nodules/adrenal nodules/liver lesions, admitted again to [facility] on 03/30/15 with complaints of weakness, poor oral intake, several days difficulty speaking but no unilateral weakness or visual abnormalities. In the ED, pancytopenic and CT head shows multiple low-attenuation areas probably due to old ischemia but acute process or metastatic lesions not excluded. MRI brain - no acute findings. Oncology consulted > Adrenal lesion Bx by interventional radiology was nondiagnostic - [facility] performed splenectomy on 9/16-pathology pending. Patient has been stable postoperatively. Patient had Lexi scan Myoview prior to surgery.   Hospital Course:   Metabolic encephalopathy/Altered MS  - unclear etiology, patient has remained confused during this hospitalization with waxing and maintaining status, without any witnessed delusions hallucinations or delirium. However compared to initial part of hospitalization, she has remained consistently alert and more coherent over the last couple of days. - likely this is patient's new baseline but will need to monitor closely. - MRI brain negative for metastatic disease or acute findings. Oncology cannot rule out paraneoplastic syndrome. - Infectious workup negative. Less likely to be TIA given duration of symptoms. No gross focal neurologic deficits otherwise - HIV: neg, RPR: non reactive, TSH 4.4019 and B 12: 1453 - BCx's: neg to. - Neurologically stable postoperatively  Acute blood loss  - hemoglobin remained stable postoperatively without the need for any transfusion. However Hb on 9/22:8 g per DL. As per hematology, received transfusion of 2 units of PRBC. Hemoglobin up to 13 with just 2 units which may trend down over the next couple of  days. Follow CBCs in a few days at [facility].  S/P laparoscopic splenectomy 04/05/15 by (Dr. Hassell Done)  - doing  well postoperatively.  - Currently on a soft diet, patient now has post splenectomy leukocytosis/thrombocytosis - She needs: Pneumonia and H. Influenza vaccines at 14 days post op, if she has not already had them already.  Constipation -mobilize , continue constipation regimen. Had BM on 9/22.  Post splenectomy leukocytosis/splenomegaly/status postsplenectomy/adrenal nodules - Postsplenectomy leukocytosis: Discussed with PamelaEnnever: He has discussed splenectomy pathology results with the pathologist and has reviewed bone marrow biopsy results and suspects that she may have some kind of lymphoproliferative/lymphoma process and will closely follow up as outpatient. - CT-guided biopsy of adrenal gland was nondiagnostic, therefore patient underwent splenectomy on 9/16. Patient initiated on stress dose steroids, IV Solu-Cortef, which was tapered and discontinued prior to discharge.  - Outpatient PET scan and follow-up with oncology. Oncologist has cleared patient for discharge to SNF.  Coronary artery disease/pericardial effusion-stable,  - risk factors include advanced age, remote tobacco use, HTN, pre DM. Her EKG showed nonspecific T wave abnormality , moderate pericardial effusion on echo that is unchanged. 2D echo with normal LVF and no RWMAs. Seen by cardiology, she has evidence of coronary artery calcifications on chest CT. She underwent Myoview on 9/15 which was negative. No evidence of pericardial tamponade. Cardiology has signed off  Dehydration with hyponatremia - Resolved,  MGUS - OP follow up with Oncology  Chronic diastolic CHF - Not on diuretics at home. Compensated.  Diet-controlled DM 2  Hyperlipidemia - Continue statins  Diarrhea - resolved  Essential hypertension - controlled off of home Inderal> follow closely ans consider initiation at low dose if starts getting hypertensive.  Fall on 9/22 - Witnessed fall. Initially complained of mild soreness of left hip,  left shoulder and left elbow to evaluating APP but currently without symptoms. Monitor  Right lower lobe pulmonary nodule/b/l adrenal masses seen on prior CT  - Outpatient follow-up and evaluation as per oncology  Depression - Continue Lexapro  Adult Failure to Thrive - Multifactorial due to several co morbidities as above  Non AG metabolic Acidosis - resolved  Pancytopenia - related to Splenomegaly- resolved after splenectomy   Consultants:  Hematology/oncology  Cardiology  General surgery  IR  Procedures:  Splenectomy  Adrenal biopsy by IR  Antibiotics:  IV cefazolin and cefoxitin 1 dose on 9/16 perioperatively.   Discharge Exam:  Complaints: Had mild nausea this morning without vomiting. Tolerating diet. Had BM on 9/22. No chest pain or dyspnea. Mild intermittent abdominal soreness-has not used opioid pain medication in several days and only a dose of Tylenol at times. Does not complain of pain elsewhere. As per nursing, no acute issues.  Filed Vitals:   04/11/15 1255 04/11/15 1505 04/11/15 2157 04/12/15 0546  BP: 145/53 130/48 134/57 157/79  Pulse: 96 102 90 104  Temp: 99.3 F (37.4 C) 98.5 F (36.9 C) 98.9 F (37.2 C) 98.8 F (37.1 C)  TempSrc: Oral Oral Oral Oral  Resp: 20 20 20 20   Height:      Weight:    64.955 kg (143 lb 3.2 oz)  SpO2: 100% 100% 95% 95%    General exam: Small built and thinly nourished pleasant elderly female sitting up comfortably in chair this morning. Respiratory system: Clear. No increased work of breathing. Cardiovascular system: S1 & S2 heard, RRR. No JVD, murmurs, gallops, clicks or pedal edema. Telemetry: Sinus rhythm. Occasional sinus tachycardia in the low 100s. Gastrointestinal system: Abdomen  is nondistended, soft and nontender. Normal bowel sounds heard. Surgical site staples/sutures intact without acute findings. Central nervous system: Alert and oriented 2. No focal neurological deficits. Extremities:  Symmetric 5 x 5 power.  Discharge Instructions      Discharge Instructions    Call MD for:  difficulty breathing, headache or visual disturbances    Complete by:  As directed      Call MD for:  extreme fatigue    Complete by:  As directed      Call MD for:  hives    Complete by:  As directed      Call MD for:  persistant dizziness or light-headedness    Complete by:  As directed      Call MD for:  persistant nausea and vomiting    Complete by:  As directed      Call MD for:  redness, tenderness, or signs of infection (pain, swelling, redness, odor or green/yellow discharge around incision site)    Complete by:  As directed      Call MD for:  severe uncontrolled pain    Complete by:  As directed      Call MD for:  temperature >100.4    Complete by:  As directed      Discharge instructions    Complete by:  As directed   Diet: soft diet.     Increase activity slowly    Complete by:  As directed             Medication List    STOP taking these medications        aspirin EC 81 MG tablet     propranolol ER 60 MG 24 hr capsule  Commonly known as:  INDERAL LA      TAKE these medications        acetaminophen 325 MG tablet  Commonly known as:  TYLENOL  Take 2 tablets (650 mg total) by mouth every 6 (six) hours as needed for mild pain, moderate pain, fever or headache (or Fever >/= 101).     escitalopram 10 MG tablet  Commonly known as:  LEXAPRO  Take 5 mg by mouth every morning.     feeding supplement (ENSURE ENLIVE) Liqd  Take 237 mLs by mouth 2 (two) times daily between meals.     fish oil-omega-3 fatty acids 1000 MG capsule  Take 2 g by mouth every morning.     folic acid 1 MG tablet  Commonly known as:  FOLVITE  Take 2 tablets (2 mg total) by mouth daily.     multivitamin capsule  Take 1 capsule by mouth every morning.     ondansetron 4 MG disintegrating tablet  Commonly known as:  ZOFRAN-ODT  Take 1 tablet (4 mg total) by mouth every 8 (eight) hours as  needed for nausea or vomiting.     pantoprazole 40 MG tablet  Commonly known as:  PROTONIX  Take 1 tablet (40 mg total) by mouth at bedtime.     polyethylene glycol packet  Commonly known as:  MIRALAX / GLYCOLAX  Take 17 g by mouth daily.     raloxifene 60 MG tablet  Commonly known as:  EVISTA  Take 60 mg by mouth every morning.     rosuvastatin 20 MG tablet  Commonly known as:  CRESTOR  Take 10 mg by mouth every morning.     simethicone 80 MG chewable tablet  Commonly known as:  MYLICON  Chew 0.5 tablets (  40 mg total) by mouth every 6 (six) hours as needed for flatulence (bloating).     Vitamin D (Ergocalciferol) 50000 UNITS Caps capsule  Commonly known as:  DRISDOL  Take 50,000 Units by mouth every Monday.       Follow-up Information    Follow up with [facility] [place] On 04/09/2015.   Why:  You have an appointment with the nurse to have your staples and sutures remode at 10AM, be at the office 30 minutes early for check in.   Contact information:   [place] [address] [phone]      Follow up with Pedro Earls, MD On 04/26/2015.   Specialty:  General Surgery   Why:  Your appointment is at 11:15 AM, be there 30 minutes early for check in.   Contact information:   [address] [place] [address] [phone]       Follow up with PERINI,MARK A, MD. Schedule an appointment as soon as possible for a visit in 3 days.   Specialty:  Internal Medicine   Contact information:   [place] [address] [phone]       Follow up with Volanda Napoleon, MD. Schedule an appointment as soon as possible for a visit in 1 week.   Specialty:  Oncology   Contact information:   [address] [place] [address] [phone]        The results of significant diagnostics from this hospitalization (including imaging, microbiology, ancillary and laboratory) are listed below  for reference.    Significant Diagnostic Studies: Dg Chest 2 View  03/30/2015   CLINICAL DATA:  Cough.  Weakness.  Not eating.  EXAM: CHEST  2 VIEW  COMPARISON:  03/14/2015  FINDINGS: 1.7 mm nodular opacity in the right lung base corresponding to metastatic lesions seen on CT chest 03/15/2015. Mild cardiac enlargement. No pulmonary vascular congestion. No focal consolidation in the lungs. No blunting of costophrenic angles. No pneumothorax. Mediastinal contours appear intact. Surgical clips in the left axilla. Degenerative changes in the spine. Degenerative changes in the shoulders.  IMPRESSION: Right lower lung nodule corresponding to metastatic lesions seen on previous CT chest. No acute infiltration or consolidation.   Electronically Signed   By: Lucienne Capers M.D.   On: 03/30/2015 21:17   Ct Head Wo Contrast  03/30/2015   CLINICAL DATA:  Patient is weekend not eating. Slurred speech. Recent hospitalizations but last ED visit was discharged home.  EXAM: CT HEAD WITHOUT CONTRAST  TECHNIQUE: Contiguous axial images were obtained from the base of the skull through the vertex without intravenous contrast.  COMPARISON:  03/14/2015  FINDINGS: Diffuse cerebral atrophy. Low-attenuation changes in the deep white matter probably representing central atrophy. Focal old areas of encephalomalacia demonstrated in the cerebellar hemispheres bilaterally, in the frontal lobes bilaterally, and in the left posterior parietal region. These are probably areas of old ischemia. However, mass lesions or acute process not entirely excluded. Consider MRI for further evaluation if clinically indicated. No mass effect or midline shift. No abnormal extra-axial fluid collections. Gray-white matter junctions are distinct. Basal cisterns are not effaced. No evidence of acute intracranial hemorrhage. No ventricular dilatation. Calvarium appears intact. Paranasal sinuses are clear. Mastoid air cells are not opacified.  IMPRESSION:  Multifocal low-attenuation areas are probably due to old ischemia abut acute process or metastatic lesions are not excluded. Consider further evaluation with MRI if clinically indicated. No acute intracranial  hemorrhage or mass effect.   Electronically Signed   By: Lucienne Capers M.D.   On: 03/30/2015 22:22   Ct Head Wo Contrast  03/14/2015   CLINICAL DATA:  [place] backwards today.  Loss of consciousness.  EXAM: CT HEAD WITHOUT CONTRAST  TECHNIQUE: Contiguous axial images were obtained from the base of the skull through the vertex without intravenous contrast.  COMPARISON:  09/12/2010.  FINDINGS: Diffusely enlarged ventricles and subarachnoid spaces. No skull fracture, intracranial hemorrhage or paranasal sinus air-fluid levels.  IMPRESSION: Mild atrophy.  No acute abnormality.   Electronically Signed   By: Claudie Revering M.D.   On: 03/14/2015 19:32   Ct Chest Wo Contrast  03/14/2015   CLINICAL DATA:  Multiple episodes of syncope today. Dizziness when changing from sitting to standing. Right-sided chest pain and shortness of breath. Falls.  EXAM: CT CHEST WITHOUT CONTRAST  TECHNIQUE: Multidetector CT imaging of the chest was performed following the standard protocol without IV contrast.  COMPARISON:  None.  FINDINGS: Normal heart size. Calcification in the Coronary arteries, mitral valve annulus, and aortic valve as well as in the aorta. Small to moderate pericardial effusion. Normal caliber thoracic aorta. Scattered mediastinal lymph nodes are not pathologically enlarged. Esophagus is decompressed.  1.8 cm diameter nodule in the right lower lung. This is indeterminate and primary or metastatic disease needs to be excluded. No other focal lung lesions. No focal consolidation or airspace disease. No pneumothorax. No pleural effusions.  Left breast reconstruction. Prominent adrenal gland nodules bilaterally, right measuring 4.8 x 1.9 cm and left measuring 2.4 x 4.7 cm. Spleen is enlarged but appears homogeneous.  Multiple low-attenuation lesions in the liver are likely represent cysts but given the presence of other possible metastatic lesions, further evaluation with contrast-enhanced CT or MRI is suggested. Degenerative changes in the spine. No destructive bone lesions appreciated.  IMPRESSION: 1.8 cm diameter right lower lung nodule and bilateral adrenal gland nodules suspicious for metastatic disease. Multiple low-attenuation lesions in the liver are probably cysts but further characterization is suggested with either contrast-enhanced CT or MRI. Small to moderate pericardial effusion. Mild splenic enlargement.   Electronically Signed   By: Lucienne Capers M.D.   On: 03/14/2015 22:07   Ct Angio Chest Pe W/cm &/or Wo Cm  03/15/2015   CLINICAL DATA:  History of breast carcinoma with recent syncopal episode and back pain  EXAM: CT ANGIOGRAPHY CHEST WITH CONTRAST  TECHNIQUE: Multidetector CT imaging of the chest was performed using the standard protocol during bolus administration of intravenous contrast. Multiplanar CT image reconstructions and MIPs were obtained to evaluate the vascular anatomy.  CONTRAST:  156m OMNIPAQUE IOHEXOL 350 MG/ML SOLN  COMPARISON:  None.  FINDINGS: The lungs are well aerated bilaterally with small left-sided pleural effusion and left basilar atelectasis. Single calcified granuloma is noted in the left lung. Scattered pulmonary nodules are identified throughout the lungs bilaterally. The largest of these lies in the right lower lobe best seen on image number 66 of series 8 measuring 19 mm in greatest dimension. A 6-7 mm nodule is noted in the left lower lobe best seen on image number 54 of series 8 as well as a tiny 2-3 mm nodule in the left apex best seen on image number 13 of series 8. Given the patient's clinical history these are consistent with metastatic foci.  The thoracic inlet is within normal limits. The thoracic aorta shows mild calcifications without aneurysmal dilatation or  dissection. A pericardial effusion is noted. This measures  approximately 2.3 cm in greatest thickness posteriorly. Mild coronary calcifications are noted. The pulmonary artery is well visualized and demonstrates a normal branching pattern. No filling defects to suggest pulmonary emboli are identified. No hilar or mediastinal adenopathy is noted. Some thickening of the midesophagus is noted just above the level of the carina best seen on image number 97 of series 7.  Changes consistent with breast implant are noted on the left. This is consistent with the patient's given clinical history of carcinoma of the breast. Postsurgical changes in the axilla are seen. No axillary adenopathy is identified. The upper abdomen again demonstrates hypodensities within the liver similar to that seen on prior CT. Adrenal lesions are again noted bilaterally.  Review of the MIP images confirms the above findings.  IMPRESSION: Changes consistent with metastatic disease within the adrenal glands bilaterally as well as the lungs bilaterally.  Multiple hypodensities are again seen within liver which may represent metastatic disease. Further evaluation is recommended.  No evidence of pulmonary embolism.  Pericardial effusion.  Thickening of the midesophagus of uncertain significance.   Electronically Signed   By: Inez Catalina M.D.   On: 03/15/2015 10:05   Mr Jeri Cos YO Contrast  03/31/2015   CLINICAL DATA:  New onset of generalized weakness and difficulty speaking. Personal history breast cancer.  EXAM: MRI HEAD WITHOUT AND WITH CONTRAST  TECHNIQUE: Multiplanar, multiecho pulse sequences of the brain and surrounding structures were obtained without and with intravenous contrast.  CONTRAST:  6m MULTIHANCE GADOBENATE DIMEGLUMINE 529 MG/ML IV SOLN  COMPARISON:  CT of the head 03/30/2015 and 09/12/2010.  FINDINGS: The diffusion-weighted images demonstrate no evidence for acute or subacute infarction. No acute hemorrhage or mass lesion is  present. The ventricles are of normal size. No significant extra-axial fluid collection is present.  The basal ganglia and brainstem are within normal limits. Flow is present in the major intracranial arteries. The globes and orbits are intact. The paranasal sinuses and mastoid air cells are clear the skullbase is within normal limits. Midline structures are unremarkable.  The postcontrast images demonstrate no pathologic enhancement.  IMPRESSION: 1. No acute or focal lesion to explain the patient's acute symptoms. 2. Mild atrophy is within normal limits for age. There is no significant white matter disease.   Electronically Signed   By: CSan MorelleM.D.   On: 03/31/2015 11:35   Mr Abdomen W Wo Contrast  03/20/2015   CLINICAL DATA:  Adrenal mass.  Right lower lobe lung lesion.  EXAM: MRI ABDOMEN WITHOUT AND WITH CONTRAST  TECHNIQUE: Multiplanar multisequence MR imaging of the abdomen was performed both before and after the administration of intravenous contrast.  CONTRAST:  124mMULTIHANCE GADOBENATE DIMEGLUMINE 529 MG/ML IV SOLN  COMPARISON:  Abdominal pelvic CT of 03/17/2015. Chest CT of 03/15/2015. History of breast cancer  FINDINGS: Portions of the exam, primarily the postcontrast dynamic series, are motion degraded.  Lower chest: Left larger than right small bilateral pleural effusions. Left breast implant. Small to moderate pericardial effusion. Right lower lobe pulmonary nodule of 1.8 cm on image 5 of series 5. Left base atelectasis.  Hepatobiliary: Multiple T2 hyperintense well-circumscribed liver lesions are consistent with cysts. On post-contrast arterial phase imaging, there are numerous foci of hyper enhancement. Example series 13001. None of these areas are identified on later post-contrast images, and are favored to represent perfusion anomalies. Normal gallbladder, without biliary ductal dilatation.  Pancreas: Normal, without mass or ductal dilatation.  Spleen: Splenomegaly, greater than  17 cm craniocaudal.  Adrenals/Urinary Tract: Left larger than right adrenal masses. 3.2 x 2.3 cm on the left. 3.1 x 1.4 cm on the right. No signal dropout on out of phase imaging. Central necrosis as evidenced by hypo enhancement.  Bilateral renal cysts. medial interpolar 1.3 cm left renal lesion on image 39 of series 5 is likely hemorrhagic cyst; no post-contrast enhancement. No hydronephrosis.  Stomach/Bowel: Normal stomach and abdominal bowel loops.  Vascular/Lymphatic: Advanced aortic atherosclerosis. Retroperitoneal nodes are prominent but not pathologic by size criteria. Right cardiophrenic angle node is upper normal at 6 mm on image 23 of series 13001.  Other: No ascites.  Musculoskeletal: Convex left lumbar spine curvature.  IMPRESSION: 1. Bilateral adrenal masses with necrosis, most consistent with metastatic disease. 2. Right lung base nodule, likely representing pulmonary metastasis. 3. Bilateral pleural and pericardial effusions. 4. No definite evidence of hepatic metastasis. Multiple hepatic cysts. Arterial foci of hyper enhancement are likely perfusion anomalies. Given the history of breast cancer, recommend attention on follow-up to exclude less likely tiny hypervascular metastasis. 5. Splenomegaly.   Electronically Signed   By: Abigail Miyamoto M.D.   On: 03/20/2015 08:30   Ct Abdomen Pelvis W Contrast  03/17/2015   CLINICAL DATA:  Pulmonary metastases, adrenal metastases, history of LEFT breast cancer 1981 post mastectomy and lymph node dissection  EXAM: CT ABDOMEN AND PELVIS WITH CONTRAST  TECHNIQUE: Multidetector CT imaging of the abdomen and pelvis was performed using the standard protocol following bolus administration of intravenous contrast. Sagittal and coronal MPR images reconstructed from axial data set.  CONTRAST:  120m OMNIPAQUE IOHEXOL 300 MG/ML SOLN IV. Dilute oral contrast.  COMPARISON:  None; correlation CT chest 03/15/2015  FINDINGS: LEFT breast prosthesis.  Small BILATERAL pleural  effusions and bibasilar atelectasis.  Small to moderate pericardial effusion.  RIGHT lower lobe mass 19 x 16 mm image 9.  Multiple low-attenuation foci within liver likely cysts, largest lateral segment LEFT lobe 2.3 x 1.8 cm.  Splenic enlargement, spleen measuring 17.9 x 13.0 x 6.7 cm.  BILATERAL adrenal masses, 4.3 x 2.8 cm LEFT and 4.3 x 2.1 cm RIGHT.  Tiny BILATERAL renal cysts.  Remainder of liver, pancreas, and adrenal glands normal.  Normal appendix.  Questionable rectal wall thickening versus artifact near anus.  Mild wall thickening at gastric cardia, unchanged from CT chest, question gastric neoplasm.  Remainder of stomach, colon and small bowel loops normal.  Unremarkable bladder, ureters, uterus and adnexa.  Free intraperitoneal fluid in pelvis.  Some additional mass, adenopathy, free air or hernia.  Scattered atherosclerotic calcifications.  Normal size retroperitoneal nodes.  Bones demineralized.  Schmorl's node superior endplate T10.  Bones demineralized.  IMPRESSION: RIGHT lower lobe mass 19 x 16 mm question primary versus metastatic focus.  BILATERAL adrenal masses concerning for metastases.  Significant splenic enlargement without focal lesion.  Gastric wall thickening at cardia, tumor not excluded; endoscopic evaluation recommended to exclude gastric neoplasm.  Inferior rectal wall thickening versus artifact, recommend correlation with digital rectal exam and proctoscopy to exclude mass.  Ascites.   Electronically Signed   By: MLavonia DanaM.D.   On: 03/17/2015 11:00   Nm Myocar Multi W/spect W/wall Motion / Ef  04/04/2015    There was no ST segment deviation noted during stress.  The study is normal.  This is a low risk study. No ischemia.  Nuclear stress EF: 76%.   Low risk study. No ischemia identified. Normal EF.    Ct Biopsy  04/03/2015   CLINICAL DATA:  80year old  female with metastatic disease of uncertain etiology. She presents for CT-guided biopsy of left adrenal mass  EXAM: CT  BIOPSY  Date: 04/03/2015  PROCEDURE: 1. CT-guided biopsy left adrenal mass Interventional Radiologist:  Criselda Peaches, MD  ANESTHESIA/SEDATION: Moderate (conscious) sedation was used. 1 mg Versed, 25 mcg Fentanyl were administered intravenously. The patient's vital signs were monitored continuously by radiology nursing throughout the procedure.  Sedation Time: 9 minutes  MEDICATIONS: None additional  TECHNIQUE: Informed consent was obtained from the patient following explanation of the procedure, risks, benefits and alternatives. The patient understands, agrees and consents for the procedure. All questions were addressed. A time out was performed.  A planning axial CT scan was performed. The large mass in the left adrenal gland was successfully identified. A suitable skin entry site was selected and marked. The region was sterilely prepped and draped in standard fashion using Betadine skin prep. Local anesthesia was attained by infiltration with 1% lidocaine. A small dermatotomy was made. Under intermittent CT fluoroscopic guidance, a 15 cm 17 gauge introducer needle was advanced through the retroperitoneal space and into the margin of the left adrenal mass. Multiple 18 gauge core biopsies were then coaxially obtained using the bio Pince automated biopsy device. Biopsy specimens were placed in saline.  The introducer needle was removed. Post biopsy axial CT imaging demonstrates no evidence of complication. Specifically, there is no evidence of pneumothorax or periadrenal hemorrhage. The patient tolerated the procedure well.  COMPLICATIONS: None  Estimated blood loss:  0  IMPRESSION: Technically successful CT-guided core biopsy of left adrenal mass.  Signed,  Criselda Peaches, MD  [facility]  [facility]   Electronically Signed   By: Jacqulynn Cadet M.D.   On: 04/03/2015 16:47   Ct Biopsy  03/19/2015   INDICATION: Pancytopenia. Please perform CT guided bone  marrow biopsy for tissue diagnostic purposes  EXAM: CT GUIDED BONE MARROW BIOPSY AND ASPIRATION.  MEDICATIONS: Fentanyl 50 mcg IV; Versed 1 mg IV  ANESTHESIA/SEDATION: Sedation Time  10 minutes  CONTRAST:  None  COMPLICATIONS: None immediate.  PROCEDURE: Informed consent was obtained from the patient following an explanation of the procedure, risks, benefits and alternatives. The patient understands, agrees and consents for the procedure. All questions were addressed. A time out was performed prior to the initiation of the procedure. The patient was positioned prone and non-contrast localization CT was performed of the pelvis to demonstrate the iliac marrow spaces. The operative site was prepped and draped in the usual sterile fashion.  Under sterile conditions and local anesthesia, a 22 gauge spinal needle was utilized for procedural planning. Next, an 11 gauge coaxial bone biopsy needle was advanced into the left iliac marrow space. Needle position was confirmed with CT imaging. Initially, bone marrow aspiration was performed. Next, a bone marrow biopsy was obtained with the 11 gauge outer bone marrow device. Samples were prepared with the cytotechnologist and deemed adequate. The needle was removed intact. Hemostasis was obtained with compression and a dressing was placed. The patient tolerated the procedure well without immediate post procedural complication.  IMPRESSION: Successful CT guided left iliac bone marrow aspiration and core biopsies.   Electronically Signed   By: Sandi Mariscal M.D.   On: 03/19/2015 15:05   Dg Chest Port 1 View  03/14/2015   CLINICAL DATA:  Status post fall.  History of breast cancer.  EXAM: PORTABLE CHEST - 1 VIEW  COMPARISON:  June 23, 2012  FINDINGS: The heart size and  mediastinal contours are stable. There is no focal infiltrate, pulmonary edema, or pleural effusion. Left axillary surgical clips are identified unchanged. The visualized skeletal structures are stable.   IMPRESSION: No active cardiopulmonary disease.   Electronically Signed   By: Abelardo Diesel M.D.   On: 03/14/2015 18:45    Microbiology: Recent Results (from the past 240 hour(s))  Surgical pcr screen     Status: None   Collection Time: 04/04/15 10:36 PM  Result Value Ref Range Status   MRSA, PCR NEGATIVE NEGATIVE Final   Staphylococcus aureus NEGATIVE NEGATIVE Final    Comment:        The Xpert SA Assay (FDA approved for NASAL specimens in patients over 84 years of age), is one component of a comprehensive surveillance program.  Test performance has been validated by [facility] for patients greater than or equal to 68 year old. It is not intended to diagnose infection nor to guide or monitor treatment.      Labs: Basic Metabolic Panel:  Recent Labs Lab 04/06/15 0430 04/07/15 0835 04/09/15 0509 04/10/15 0831 04/11/15 0506  NA 139 140 140 137 138  K 4.2 4.3 4.0 3.6 4.2  CL 106 107 105 104 105  CO2 29 29 26 27 26   GLUCOSE 160* 132* 117* 89 94  BUN 16 12 5* <5* <5*  CREATININE 0.90 0.85 0.72 0.72 0.73  CALCIUM 7.9* 8.3* 8.3* 8.1* 8.2*   Liver Function Tests:  Recent Labs Lab 04/06/15 0430 04/07/15 0835 04/09/15 0509 04/10/15 0831 04/11/15 0506  AST 44* 38 48* 44* 47*  ALT 18 16 21 20 17   ALKPHOS 74 81 78 79 72  BILITOT 0.3 0.4 0.4 0.5 0.6  PROT 4.5* 4.9* 4.5* 4.7* 4.4*  ALBUMIN 2.3* 2.5* 2.3* 2.3* 2.2*   No results for input(s): LIPASE, AMYLASE in the last 168 hours. No results for input(s): AMMONIA in the last 168 hours. CBC:  Recent Labs Lab 04/06/15 0430  04/09/15 0509 04/10/15 0831 04/11/15 0506 04/11/15 1740 04/12/15 0504  WBC 9.1  < > 12.6* 12.3* 12.9* 18.2* 17.2*  NEUTROABS 7.5  --   --   --   --   --   --   HGB 7.6*  < > 8.4* 8.6* 8.0* 12.4 13.0  HCT 23.8*  < > 27.0* 27.7* 24.6* 36.4 39.1  MCV 93.7  < > 97.5 97.2 98.0 93.3 93.3  PLT 214  < > 498* 634* 369 605* 624*  < > = values in this interval not displayed. Cardiac Enzymes: No  results for input(s): CKTOTAL, CKMB, CKMBINDEX, TROPONINI in the last 168 hours. BNP: BNP (last 3 results)  Recent Labs  03/14/15 1905 03/31/15 0346  BNP 23.4 53.3    ProBNP (last 3 results) No results for input(s): PROBNP in the last 8760 hours.  CBG:  Recent Labs Lab 04/05/15 1430  GLUCAP 129*      Signed:  Vernell Leep, MD, FACP, FHM. [facility] Pager [phone]  If 7PM-7AM, please contact night-coverage [email or web address] Password [facility] 04/12/2015, 10:21 AM

## 2015-04-12 NOTE — Progress Notes (Signed)
Pt for discharge to [facility].  CSW facilitated pt discharge needs including contacting facility, faxing pt discharge information via [facility], discussing with pt at bedside and pt daughter, [name] via telephone, providing RN phone number to call report, and providing discharge packet to RN to provide to pt daughter upon pt daughter arrival to the hospital. Pt daughter plans to transport via private vehicle.  Pt coping appropriately with transition to [facility].  No further social work needs identified at this time.  CSW signing off.   Alison Murray, MSW, [place] Work [phone]

## 2015-04-12 NOTE — Clinical Social Work Placement (Signed)
   CLINICAL SOCIAL WORK PLACEMENT  NOTE  Date:  04/12/2015  Patient Details  Name: [name] [name] MRN: [id number] Date of Birth: [birth year]  Clinical Social Work is seeking post-discharge placement for this patient at the [place] level of care (*CSW will initial, date and re-position this form in  chart as items are completed):  Yes   Patient/family provided with [place] Work Department's list of facilities offering this level of care within the geographic area requested by the patient (or if unable, by the patient's family).  Yes   Patient/family informed of their freedom to choose among providers that offer the needed level of care, that participate in Medicare, Medicaid or managed care program needed by the patient, have an available bed and are willing to accept the patient.  Yes   Patient/family informed of [place]'s ownership interest in [facility] and [facility], as well as of the fact that they are under no obligation to receive care at these facilities.  PASRR submitted to EDS on       PASRR number received on       Existing PASRR number confirmed on 04/03/15     FL2 transmitted to all facilities in geographic area requested by pt/family on 04/03/15     FL2 transmitted to all facilities within larger geographic area on       Patient informed that his/her managed care company has contracts with or will negotiate with certain facilities, including the following:        Yes   Patient/family informed of bed offers received.  Patient chooses bed at [facility]     Physician recommends and patient chooses bed at      Patient to be transferred to [facility] on 04/12/15.  Patient to be transferred to facility by pt daughter via private vehicle     Patient family notified on 04/12/15 of transfer.  Name of family member notified:  pt notified at bedside and pt daughter, [name] notified via  telephone     PHYSICIAN Please sign FL2     Additional Comment:    _______________________________________________ Ladell Pier, LCSW 04/12/2015, 11:55 AM

## 2015-04-12 NOTE — Telephone Encounter (Signed)
Called patient's cell phone. L/m stating patient's appt for October 2016.       [facility].

## 2015-04-12 NOTE — Progress Notes (Signed)
Pt will be discharge to [place] today, to be transported by daughter to the facility.

## 2015-04-12 NOTE — Progress Notes (Signed)
7 Days Post-Op  Subjective: She looks good.  The sites are all fine, the one on her side had some drainage, but it was dried and from early yesterday.    Objective: Vital signs in last 24 hours: Temp:  [98.3 F (36.8 C)-99.9 F (37.7 C)] 98.8 F (37.1 C) (09/23 0546) Pulse Rate:  [90-104] 104 (09/23 0546) Resp:  [20] 20 (09/23 0546) BP: (130-157)/(48-79) 157/79 mmHg (09/23 0546) SpO2:  [95 %-100 %] 95 % (09/23 0546) Weight:  [64.955 kg (143 lb 3.2 oz)] 64.955 kg (143 lb 3.2 oz) (09/23 0546) Last BM Date: 04/10/15 120 PO recorded  Soft diet 1 stool recorded Afebrile, VSS WBC still up, H/H is up after 2 unit of blood and Platelets are still up. Intake/Output from previous day: 09/22 0701 - 09/23 0700 In: 1290 [P.O.:120; I.V.:500; Blood:670] Out: -  Intake/Output this shift:    General appearance: alert, cooperative and no distress Resp: clear to auscultation bilaterally GI: soft, non-tender; bowel sounds normal; no masses,  no organomegaly and sites all look good.  Lab Results:   Recent Labs  04/11/15 1740 04/12/15 0504  WBC 18.2* 17.2*  HGB 12.4 13.0  HCT 36.4 39.1  PLT 605* 624*    BMET  Recent Labs  04/10/15 0831 04/11/15 0506  NA 137 138  K 3.6 4.2  CL 104 105  CO2 27 26  GLUCOSE 89 94  BUN <5* <5*  CREATININE 0.72 0.73  CALCIUM 8.1* 8.2*   PT/INR No results for input(s): LABPROT, INR in the last 72 hours.   Recent Labs Lab 04/06/15 0430 04/07/15 0835 04/09/15 0509 04/10/15 0831 04/11/15 0506  AST 44* 38 48* 44* 47*  ALT 18 16 21 20 17   ALKPHOS 74 81 78 79 72  BILITOT 0.3 0.4 0.4 0.5 0.6  PROT 4.5* 4.9* 4.5* 4.7* 4.4*  ALBUMIN 2.3* 2.5* 2.3* 2.3* 2.2*     Lipase     Component Value Date/Time   LIPASE 54* 03/14/2015 1905     Studies/Results: No results found.  Medications: . sodium chloride   Intravenous Once  . folic acid  2 mg Oral Daily  . heparin subcutaneous  5,000 Units Subcutaneous 3 times per day  . hydrocortisone  10  mg Oral Daily  . pantoprazole  40 mg Oral QHS  . polyethylene glycol  17 g Oral Daily    Assessment/Plan Pancytopenia with history of left breast cancer and some necrotic tissue removed from her left adrenal. Laparoscopic splenectomy for splenomegaly and pancytopenia,(adrenal gland was not biopsied or removed during this procedure) 04/06/15, DR. Johnathan Hausen Adrenal nodules Mental status changes Pulmonary nodules/lesions Chronic diastolic heart failure Essential hypertension Hyperlipidemia Diet-controlled DM2 Antibiotics: None DVT: Heparin/SCD    Plan:  From a surgical standpoint she looks good, i cannot see a surgical reason for her WBC, it has been up since her splenectomy.  We have follow up in the AVS.     LOS: 13 days    [name] [name],[name] [name] 04/12/2015

## 2015-04-12 NOTE — Care Management Important Message (Signed)
Important Message  Patient Details  Name: Sonda Coppens MRN: [id number] Date of Birth: 04/02/1935   Medicare Important Message Given:  Yes-second notification given    Camillo Flaming 04/12/2015, 11:11 AMImportant Message  Patient Details  Name: Jayli Fogleman MRN: [id number] Date of Birth: Jul 05, 1935   Medicare Important Message Given:  Yes-second notification given    Camillo Flaming 04/12/2015, 11:11 AM

## 2015-04-14 ENCOUNTER — Inpatient Hospital Stay (HOSPITAL_COMMUNITY)
Admission: EM | Admit: 2015-04-14 | Discharge: 2015-05-21 | DRG: 853 | Disposition: E | Payer: Medicare Other | Attending: Internal Medicine | Admitting: Internal Medicine

## 2015-04-14 ENCOUNTER — Encounter (HOSPITAL_COMMUNITY): Payer: Self-pay | Admitting: *Deleted

## 2015-04-14 ENCOUNTER — Emergency Department (HOSPITAL_COMMUNITY): Payer: Medicare Other

## 2015-04-14 DIAGNOSIS — D649 Anemia, unspecified: Secondary | ICD-10-CM | POA: Diagnosis not present

## 2015-04-14 DIAGNOSIS — I110 Hypertensive heart disease with heart failure: Secondary | ICD-10-CM | POA: Diagnosis present

## 2015-04-14 DIAGNOSIS — J939 Pneumothorax, unspecified: Secondary | ICD-10-CM

## 2015-04-14 DIAGNOSIS — Z515 Encounter for palliative care: Secondary | ICD-10-CM | POA: Insufficient documentation

## 2015-04-14 DIAGNOSIS — J189 Pneumonia, unspecified organism: Secondary | ICD-10-CM | POA: Diagnosis present

## 2015-04-14 DIAGNOSIS — I2510 Atherosclerotic heart disease of native coronary artery without angina pectoris: Secondary | ICD-10-CM | POA: Diagnosis present

## 2015-04-14 DIAGNOSIS — D6959 Other secondary thrombocytopenia: Secondary | ICD-10-CM | POA: Diagnosis not present

## 2015-04-14 DIAGNOSIS — R627 Adult failure to thrive: Secondary | ICD-10-CM | POA: Diagnosis present

## 2015-04-14 DIAGNOSIS — Y95 Nosocomial condition: Secondary | ICD-10-CM | POA: Diagnosis present

## 2015-04-14 DIAGNOSIS — E440 Moderate protein-calorie malnutrition: Secondary | ICD-10-CM | POA: Diagnosis not present

## 2015-04-14 DIAGNOSIS — R410 Disorientation, unspecified: Secondary | ICD-10-CM | POA: Diagnosis not present

## 2015-04-14 DIAGNOSIS — J9601 Acute respiratory failure with hypoxia: Secondary | ICD-10-CM | POA: Diagnosis present

## 2015-04-14 DIAGNOSIS — E559 Vitamin D deficiency, unspecified: Secondary | ICD-10-CM | POA: Diagnosis present

## 2015-04-14 DIAGNOSIS — Z9289 Personal history of other medical treatment: Secondary | ICD-10-CM

## 2015-04-14 DIAGNOSIS — I959 Hypotension, unspecified: Secondary | ICD-10-CM | POA: Diagnosis not present

## 2015-04-14 DIAGNOSIS — A419 Sepsis, unspecified organism: Secondary | ICD-10-CM | POA: Insufficient documentation

## 2015-04-14 DIAGNOSIS — R6521 Severe sepsis with septic shock: Secondary | ICD-10-CM | POA: Diagnosis not present

## 2015-04-14 DIAGNOSIS — R238 Other skin changes: Secondary | ICD-10-CM | POA: Diagnosis not present

## 2015-04-14 DIAGNOSIS — E876 Hypokalemia: Secondary | ICD-10-CM | POA: Diagnosis present

## 2015-04-14 DIAGNOSIS — Z853 Personal history of malignant neoplasm of breast: Secondary | ICD-10-CM

## 2015-04-14 DIAGNOSIS — J90 Pleural effusion, not elsewhere classified: Secondary | ICD-10-CM

## 2015-04-14 DIAGNOSIS — E785 Hyperlipidemia, unspecified: Secondary | ICD-10-CM | POA: Diagnosis present

## 2015-04-14 DIAGNOSIS — R34 Anuria and oliguria: Secondary | ICD-10-CM | POA: Diagnosis not present

## 2015-04-14 DIAGNOSIS — Z87891 Personal history of nicotine dependence: Secondary | ICD-10-CM | POA: Diagnosis not present

## 2015-04-14 DIAGNOSIS — Z7189 Other specified counseling: Secondary | ICD-10-CM | POA: Insufficient documentation

## 2015-04-14 DIAGNOSIS — E43 Unspecified severe protein-calorie malnutrition: Secondary | ICD-10-CM | POA: Diagnosis present

## 2015-04-14 DIAGNOSIS — G9340 Encephalopathy, unspecified: Secondary | ICD-10-CM | POA: Diagnosis not present

## 2015-04-14 DIAGNOSIS — Z9081 Acquired absence of spleen: Secondary | ICD-10-CM

## 2015-04-14 DIAGNOSIS — Z9089 Acquired absence of other organs: Secondary | ICD-10-CM

## 2015-04-14 DIAGNOSIS — E1165 Type 2 diabetes mellitus with hyperglycemia: Secondary | ICD-10-CM | POA: Diagnosis not present

## 2015-04-14 DIAGNOSIS — I472 Ventricular tachycardia: Secondary | ICD-10-CM | POA: Diagnosis not present

## 2015-04-14 DIAGNOSIS — E874 Mixed disorder of acid-base balance: Secondary | ICD-10-CM | POA: Diagnosis not present

## 2015-04-14 DIAGNOSIS — R911 Solitary pulmonary nodule: Secondary | ICD-10-CM | POA: Diagnosis present

## 2015-04-14 DIAGNOSIS — A413 Sepsis due to Hemophilus influenzae: Secondary | ICD-10-CM

## 2015-04-14 DIAGNOSIS — R918 Other nonspecific abnormal finding of lung field: Secondary | ICD-10-CM | POA: Insufficient documentation

## 2015-04-14 DIAGNOSIS — L89153 Pressure ulcer of sacral region, stage 3: Secondary | ICD-10-CM | POA: Diagnosis present

## 2015-04-14 DIAGNOSIS — I493 Ventricular premature depolarization: Secondary | ICD-10-CM | POA: Diagnosis not present

## 2015-04-14 DIAGNOSIS — L958 Other vasculitis limited to the skin: Secondary | ICD-10-CM | POA: Diagnosis not present

## 2015-04-14 DIAGNOSIS — J9811 Atelectasis: Secondary | ICD-10-CM | POA: Diagnosis not present

## 2015-04-14 DIAGNOSIS — I314 Cardiac tamponade: Secondary | ICD-10-CM | POA: Diagnosis present

## 2015-04-14 DIAGNOSIS — N179 Acute kidney failure, unspecified: Secondary | ICD-10-CM

## 2015-04-14 DIAGNOSIS — Z9010 Acquired absence of unspecified breast and nipple: Secondary | ICD-10-CM

## 2015-04-14 DIAGNOSIS — Z789 Other specified health status: Secondary | ICD-10-CM | POA: Diagnosis not present

## 2015-04-14 DIAGNOSIS — F419 Anxiety disorder, unspecified: Secondary | ICD-10-CM | POA: Diagnosis not present

## 2015-04-14 DIAGNOSIS — E2740 Unspecified adrenocortical insufficiency: Secondary | ICD-10-CM | POA: Diagnosis present

## 2015-04-14 DIAGNOSIS — D472 Monoclonal gammopathy: Secondary | ICD-10-CM | POA: Diagnosis present

## 2015-04-14 DIAGNOSIS — R5383 Other fatigue: Secondary | ICD-10-CM

## 2015-04-14 DIAGNOSIS — I313 Pericardial effusion (noninflammatory): Secondary | ICD-10-CM | POA: Diagnosis present

## 2015-04-14 DIAGNOSIS — R5381 Other malaise: Secondary | ICD-10-CM | POA: Diagnosis not present

## 2015-04-14 DIAGNOSIS — E871 Hypo-osmolality and hyponatremia: Secondary | ICD-10-CM | POA: Diagnosis present

## 2015-04-14 DIAGNOSIS — Z4659 Encounter for fitting and adjustment of other gastrointestinal appliance and device: Secondary | ICD-10-CM

## 2015-04-14 DIAGNOSIS — C801 Malignant (primary) neoplasm, unspecified: Secondary | ICD-10-CM | POA: Diagnosis not present

## 2015-04-14 DIAGNOSIS — R0682 Tachypnea, not elsewhere classified: Secondary | ICD-10-CM | POA: Diagnosis not present

## 2015-04-14 DIAGNOSIS — J9600 Acute respiratory failure, unspecified whether with hypoxia or hypercapnia: Secondary | ICD-10-CM | POA: Diagnosis not present

## 2015-04-14 DIAGNOSIS — E278 Other specified disorders of adrenal gland: Secondary | ICD-10-CM | POA: Diagnosis present

## 2015-04-14 DIAGNOSIS — E870 Hyperosmolality and hypernatremia: Secondary | ICD-10-CM | POA: Diagnosis not present

## 2015-04-14 DIAGNOSIS — R531 Weakness: Secondary | ICD-10-CM

## 2015-04-14 DIAGNOSIS — J810 Acute pulmonary edema: Secondary | ICD-10-CM

## 2015-04-14 DIAGNOSIS — R58 Hemorrhage, not elsewhere classified: Secondary | ICD-10-CM | POA: Diagnosis not present

## 2015-04-14 DIAGNOSIS — R000 Tachycardia, unspecified: Secondary | ICD-10-CM | POA: Diagnosis not present

## 2015-04-14 DIAGNOSIS — M810 Age-related osteoporosis without current pathological fracture: Secondary | ICD-10-CM | POA: Diagnosis present

## 2015-04-14 DIAGNOSIS — I5032 Chronic diastolic (congestive) heart failure: Secondary | ICD-10-CM | POA: Diagnosis present

## 2015-04-14 DIAGNOSIS — E279 Disorder of adrenal gland, unspecified: Secondary | ICD-10-CM | POA: Diagnosis not present

## 2015-04-14 DIAGNOSIS — R4182 Altered mental status, unspecified: Secondary | ICD-10-CM

## 2015-04-14 DIAGNOSIS — Z66 Do not resuscitate: Secondary | ICD-10-CM | POA: Diagnosis not present

## 2015-04-14 DIAGNOSIS — I749 Embolism and thrombosis of unspecified artery: Secondary | ICD-10-CM | POA: Diagnosis not present

## 2015-04-14 DIAGNOSIS — R609 Edema, unspecified: Secondary | ICD-10-CM

## 2015-04-14 DIAGNOSIS — J0100 Acute maxillary sinusitis, unspecified: Secondary | ICD-10-CM | POA: Diagnosis not present

## 2015-04-14 DIAGNOSIS — N184 Chronic kidney disease, stage 4 (severe): Secondary | ICD-10-CM | POA: Diagnosis not present

## 2015-04-14 DIAGNOSIS — Z9689 Presence of other specified functional implants: Secondary | ICD-10-CM

## 2015-04-14 DIAGNOSIS — I3139 Other pericardial effusion (noninflammatory): Secondary | ICD-10-CM | POA: Diagnosis present

## 2015-04-14 DIAGNOSIS — I4729 Other ventricular tachycardia: Secondary | ICD-10-CM

## 2015-04-14 DIAGNOSIS — R402434 Glasgow coma scale score 3-8, 24 hours or more after hospital admission: Secondary | ICD-10-CM | POA: Diagnosis not present

## 2015-04-14 DIAGNOSIS — L8990 Pressure ulcer of unspecified site, unspecified stage: Secondary | ICD-10-CM | POA: Diagnosis present

## 2015-04-14 DIAGNOSIS — D65 Disseminated intravascular coagulation [defibrination syndrome]: Secondary | ICD-10-CM | POA: Diagnosis not present

## 2015-04-14 DIAGNOSIS — J811 Chronic pulmonary edema: Secondary | ICD-10-CM

## 2015-04-14 DIAGNOSIS — Z79899 Other long term (current) drug therapy: Secondary | ICD-10-CM | POA: Diagnosis not present

## 2015-04-14 DIAGNOSIS — J948 Other specified pleural conditions: Secondary | ICD-10-CM | POA: Diagnosis not present

## 2015-04-14 DIAGNOSIS — I999 Unspecified disorder of circulatory system: Secondary | ICD-10-CM

## 2015-04-14 DIAGNOSIS — Z978 Presence of other specified devices: Secondary | ICD-10-CM

## 2015-04-14 DIAGNOSIS — R05 Cough: Secondary | ICD-10-CM | POA: Diagnosis present

## 2015-04-14 DIAGNOSIS — R234 Changes in skin texture: Secondary | ICD-10-CM | POA: Diagnosis not present

## 2015-04-14 DIAGNOSIS — D500 Iron deficiency anemia secondary to blood loss (chronic): Secondary | ICD-10-CM

## 2015-04-14 LAB — MRSA PCR SCREENING: MRSA BY PCR: NEGATIVE

## 2015-04-14 LAB — COMPREHENSIVE METABOLIC PANEL
ALBUMIN: 2.5 g/dL — AB (ref 3.5–5.0)
ALBUMIN: 2.1 g/dL — AB (ref 3.5–5.0)
ALK PHOS: 146 U/L — AB (ref 38–126)
ALT: 24 U/L (ref 14–54)
ALT: 21 U/L (ref 14–54)
ANION GAP: 13 (ref 5–15)
ANION GAP: 8 (ref 5–15)
AST: 66 U/L — ABNORMAL HIGH (ref 15–41)
AST: 63 U/L — ABNORMAL HIGH (ref 15–41)
Alkaline Phosphatase: 135 U/L — ABNORMAL HIGH (ref 38–126)
BILIRUBIN TOTAL: 0.6 mg/dL (ref 0.3–1.2)
BUN: 11 mg/dL (ref 6–20)
BUN: 9 mg/dL (ref 6–20)
CHLORIDE: 92 mmol/L — AB (ref 101–111)
CO2: 27 mmol/L (ref 22–32)
CO2: 26 mmol/L (ref 22–32)
Calcium: 8.2 mg/dL — ABNORMAL LOW (ref 8.9–10.3)
Calcium: 7.5 mg/dL — ABNORMAL LOW (ref 8.9–10.3)
Chloride: 96 mmol/L — ABNORMAL LOW (ref 101–111)
Creatinine, Ser: 0.89 mg/dL (ref 0.44–1.00)
Creatinine, Ser: 0.7 mg/dL (ref 0.44–1.00)
GFR calc Af Amer: >60 mL/min (ref >60)
GFR calc Af Amer: >60 mL/min (ref >60)
GFR calc non Af Amer: >60 mL/min (ref >60)
GLUCOSE: 76 mg/dL (ref 65–99)
GLUCOSE: 85 mg/dL (ref 65–99)
POTASSIUM: 3.1 mmol/L — AB (ref 3.5–5.1)
POTASSIUM: 3.2 mmol/L — AB (ref 3.5–5.1)
SODIUM: 132 mmol/L — AB (ref 135–145)
Sodium: 130 mmol/L — ABNORMAL LOW (ref 135–145)
TOTAL PROTEIN: 4.8 g/dL — AB (ref 6.5–8.1)
Total Bilirubin: 1.1 mg/dL (ref 0.3–1.2)
Total Protein: 5.3 g/dL — ABNORMAL LOW (ref 6.5–8.1)

## 2015-04-14 LAB — CBC WITH DIFFERENTIAL/PLATELET
BASOS ABS: 0.1 10*3/uL (ref 0.0–0.1)
BASOS PCT: 0 %
Basophils Absolute: 0 10*3/uL (ref 0.0–0.1)
Basophils Relative: 1 %
EOS ABS: 0.1 10*3/uL (ref 0.0–0.7)
EOS PCT: 1 %
Eosinophils Absolute: 0.1 10*3/uL (ref 0.0–0.7)
Eosinophils Relative: 1 %
HCT: 34.6 % — ABNORMAL LOW (ref 36.0–46.0)
HEMATOCRIT: 37.5 % (ref 36.0–46.0)
HEMOGLOBIN: 12.2 g/dL (ref 12.0–15.0)
HEMOGLOBIN: 11.4 g/dL — AB (ref 12.0–15.0)
LYMPHS PCT: 4 %
Lymphocytes Relative: 7 %
Lymphs Abs: 0.4 10*3/uL — ABNORMAL LOW (ref 0.7–4.0)
Lymphs Abs: 0.5 10*3/uL — ABNORMAL LOW (ref 0.7–4.0)
MCH: 30.4 pg (ref 26.0–34.0)
MCH: 31.6 pg (ref 26.0–34.0)
MCHC: 32.5 g/dL (ref 30.0–36.0)
MCHC: 32.9 g/dL (ref 30.0–36.0)
MCV: 93.5 fL (ref 78.0–100.0)
MCV: 95.8 fL (ref 78.0–100.0)
MONOS PCT: 7 %
Monocytes Absolute: 0.6 10*3/uL (ref 0.1–1.0)
Monocytes Absolute: 0.5 10*3/uL (ref 0.1–1.0)
Monocytes Relative: 7 %
NEUTROS ABS: 7.9 10*3/uL — AB (ref 1.7–7.7)
NEUTROS PCT: 88 %
NEUTROS PCT: 84 %
Neutro Abs: 6.4 10*3/uL (ref 1.7–7.7)
Platelets: 440 10*3/uL — ABNORMAL HIGH (ref 150–400)
Platelets: 379 10*3/uL (ref 150–400)
RBC: 4.01 MIL/uL (ref 3.87–5.11)
RBC: 3.61 MIL/uL — AB (ref 3.87–5.11)
RDW: 14.5 % (ref 11.5–15.5)
RDW: 14.6 % (ref 11.5–15.5)
WBC: 9 10*3/uL (ref 4.0–10.5)
WBC: 7.6 10*3/uL (ref 4.0–10.5)

## 2015-04-14 LAB — URINALYSIS, ROUTINE W REFLEX MICROSCOPIC
Bilirubin Urine: NEGATIVE
GLUCOSE, UA: NEGATIVE mg/dL
HGB URINE DIPSTICK: NEGATIVE
Ketones, ur: >80 mg/dL — AB
LEUKOCYTES UA: NEGATIVE
Nitrite: NEGATIVE
PROTEIN: NEGATIVE mg/dL
SPECIFIC GRAVITY, URINE: 1.016 (ref 1.005–1.030)
UROBILINOGEN UA: 0.2 mg/dL (ref 0.0–1.0)
pH: 5.5 (ref 5.0–8.0)

## 2015-04-14 LAB — MAGNESIUM: MAGNESIUM: 1.1 mg/dL — AB (ref 1.7–2.4)

## 2015-04-14 LAB — TSH: TSH: 2.841 u[IU]/mL (ref 0.350–4.500)

## 2015-04-14 LAB — LACTIC ACID, PLASMA
LACTIC ACID, VENOUS: 1.2 mmol/L (ref 0.5–2.0)
LACTIC ACID, VENOUS: 1.4 mmol/L (ref 0.5–2.0)

## 2015-04-14 LAB — I-STAT CG4 LACTIC ACID, ED: LACTIC ACID, VENOUS: 1.26 mmol/L (ref 0.5–2.0)

## 2015-04-14 LAB — PHOSPHORUS: PHOSPHORUS: 3.3 mg/dL (ref 2.5–4.6)

## 2015-04-14 LAB — PROTIME-INR
INR: 1.44 (ref 0.00–1.49)
PROTHROMBIN TIME: 17.6 s — AB (ref 11.6–15.2)

## 2015-04-14 LAB — PROCALCITONIN: PROCALCITONIN: 0.23 ng/mL

## 2015-04-14 LAB — APTT: APTT: 24 s (ref 24–37)

## 2015-04-14 MED ORDER — VANCOMYCIN HCL 500 MG IV SOLR
500.0000 mg | Freq: Two times a day (BID) | INTRAVENOUS | Status: DC
  Administered 2015-04-15 – 2015-04-17 (×4): 500 mg via INTRAVENOUS
  Filled 2015-04-14 (×8): qty 500

## 2015-04-14 MED ORDER — LEVALBUTEROL HCL 0.63 MG/3ML IN NEBU
0.6300 mg | INHALATION_SOLUTION | Freq: Four times a day (QID) | RESPIRATORY_TRACT | Status: DC
  Administered 2015-04-14: 0.63 mg via RESPIRATORY_TRACT
  Filled 2015-04-14: qty 3

## 2015-04-14 MED ORDER — IPRATROPIUM BROMIDE 0.02 % IN SOLN
0.5000 mg | Freq: Three times a day (TID) | RESPIRATORY_TRACT | Status: DC
  Administered 2015-04-15 (×3): 0.5 mg via RESPIRATORY_TRACT
  Filled 2015-04-14 (×3): qty 2.5

## 2015-04-14 MED ORDER — ONDANSETRON HCL 4 MG PO TABS: 4.0000 mg | ORAL_TABLET | Freq: Four times a day (QID) | ORAL | Status: DC | PRN

## 2015-04-14 MED ORDER — ADULT MULTIVITAMIN W/MINERALS CH
1.0000 | ORAL_TABLET | Freq: Every day | ORAL | Status: DC
  Administered 2015-04-15: 1 via ORAL
  Filled 2015-04-14 (×2): qty 1

## 2015-04-14 MED ORDER — DEXTROSE 5 % IV SOLN: 2.0000 g | Freq: Three times a day (TID) | INTRAVENOUS | Status: DC

## 2015-04-14 MED ORDER — ROSUVASTATIN CALCIUM 10 MG PO TABS
10.0000 mg | ORAL_TABLET | Freq: Every morning | ORAL | Status: DC
  Administered 2015-04-15 – 2015-04-20 (×6): 10 mg via ORAL
  Filled 2015-04-14 (×7): qty 1

## 2015-04-14 MED ORDER — OMEGA-3 FATTY ACIDS 1000 MG PO CAPS: 2.0000 g | ORAL_CAPSULE | Freq: Every morning | ORAL | Status: DC

## 2015-04-14 MED ORDER — ENSURE ENLIVE PO LIQD
237.0000 mL | Freq: Two times a day (BID) | ORAL | Status: DC
  Administered 2015-04-15: 237 mL via ORAL

## 2015-04-14 MED ORDER — MULTIVITAMINS PO CAPS: 1.0000 | ORAL_CAPSULE | Freq: Every morning | ORAL | Status: DC

## 2015-04-14 MED ORDER — ONDANSETRON HCL 4 MG/2ML IJ SOLN: 4.0000 mg | Freq: Four times a day (QID) | INTRAMUSCULAR | Status: DC | PRN

## 2015-04-14 MED ORDER — SODIUM CHLORIDE 0.9 % IV SOLN
INTRAVENOUS | Status: DC
  Administered 2015-04-14: 15:00:00 via INTRAVENOUS

## 2015-04-14 MED ORDER — SODIUM CHLORIDE 0.9 % IJ SOLN: 3.0000 mL | Freq: Two times a day (BID) | INTRAMUSCULAR | Status: DC

## 2015-04-14 MED ORDER — LEVALBUTEROL HCL 0.63 MG/3ML IN NEBU
0.6300 mg | INHALATION_SOLUTION | Freq: Three times a day (TID) | RESPIRATORY_TRACT | Status: DC
  Administered 2015-04-15 (×3): 0.63 mg via RESPIRATORY_TRACT
  Filled 2015-04-14 (×3): qty 3

## 2015-04-14 MED ORDER — ACETAMINOPHEN 325 MG PO TABS
650.0000 mg | ORAL_TABLET | Freq: Four times a day (QID) | ORAL | Status: DC | PRN
  Administered 2015-04-15 (×3): 650 mg via ORAL
  Filled 2015-04-14 (×3): qty 2

## 2015-04-14 MED ORDER — RALOXIFENE HCL 60 MG PO TABS
60.0000 mg | ORAL_TABLET | Freq: Every morning | ORAL | Status: DC
  Administered 2015-04-15 – 2015-05-02 (×15): 60 mg via ORAL
  Filled 2015-04-14 (×18): qty 1

## 2015-04-14 MED ORDER — PANTOPRAZOLE SODIUM 40 MG PO TBEC
40.0000 mg | DELAYED_RELEASE_TABLET | Freq: Every day | ORAL | Status: DC
  Administered 2015-04-14: 40 mg via ORAL
  Filled 2015-04-14: qty 1

## 2015-04-14 MED ORDER — ACETAMINOPHEN 325 MG PO TABS
650.0000 mg | ORAL_TABLET | Freq: Once | ORAL | Status: AC
  Administered 2015-04-14: 650 mg via ORAL
  Filled 2015-04-14: qty 2

## 2015-04-14 MED ORDER — SODIUM CHLORIDE 0.9 % IV BOLUS (SEPSIS)
500.0000 mL | Freq: Once | INTRAVENOUS | Status: AC
Start: 2015-04-14 — End: 2015-04-14
  Administered 2015-04-14: 500 mL via INTRAVENOUS

## 2015-04-14 MED ORDER — OMEGA-3-ACID ETHYL ESTERS 1 G PO CAPS
1.0000 g | ORAL_CAPSULE | Freq: Two times a day (BID) | ORAL | Status: DC
  Administered 2015-04-15: 1 g via ORAL
  Filled 2015-04-14 (×3): qty 1

## 2015-04-14 MED ORDER — SIMETHICONE 80 MG PO CHEW
40.0000 mg | CHEWABLE_TABLET | Freq: Four times a day (QID) | ORAL | Status: DC | PRN
  Filled 2015-04-14: qty 1

## 2015-04-14 MED ORDER — VANCOMYCIN HCL IN DEXTROSE 1-5 GM/200ML-% IV SOLN
1000.0000 mg | Freq: Once | INTRAVENOUS | Status: AC
  Administered 2015-04-14: 1000 mg via INTRAVENOUS
  Filled 2015-04-14: qty 200

## 2015-04-14 MED ORDER — ESCITALOPRAM OXALATE 5 MG PO TABS
5.0000 mg | ORAL_TABLET | Freq: Every morning | ORAL | Status: DC
  Administered 2015-04-15 – 2015-04-20 (×6): 5 mg via ORAL
  Filled 2015-04-14 (×7): qty 1

## 2015-04-14 MED ORDER — IPRATROPIUM BROMIDE 0.02 % IN SOLN
0.5000 mg | Freq: Four times a day (QID) | RESPIRATORY_TRACT | Status: DC
  Administered 2015-04-14: 0.5 mg via RESPIRATORY_TRACT
  Filled 2015-04-14: qty 2.5

## 2015-04-14 MED ORDER — FOLIC ACID 1 MG PO TABS
2.0000 mg | ORAL_TABLET | Freq: Every day | ORAL | Status: DC
  Administered 2015-04-15 – 2015-04-20 (×5): 2 mg via ORAL
  Filled 2015-04-14 (×7): qty 2

## 2015-04-14 MED ORDER — POTASSIUM CHLORIDE CRYS ER 20 MEQ PO TBCR
40.0000 meq | EXTENDED_RELEASE_TABLET | Freq: Once | ORAL | Status: DC
  Administered 2015-04-14: 40 meq via ORAL
  Filled 2015-04-14: qty 2

## 2015-04-14 MED ORDER — IPRATROPIUM BROMIDE 0.02 % IN SOLN: 0.5000 mg | Freq: Four times a day (QID) | RESPIRATORY_TRACT | Status: DC | PRN

## 2015-04-14 MED ORDER — LEVALBUTEROL HCL 0.63 MG/3ML IN NEBU: 0.6300 mg | INHALATION_SOLUTION | Freq: Four times a day (QID) | RESPIRATORY_TRACT | Status: DC | PRN

## 2015-04-14 MED ORDER — VANCOMYCIN HCL 500 MG IV SOLR: 500.0000 mg | Freq: Two times a day (BID) | INTRAVENOUS | Status: DC

## 2015-04-14 MED ORDER — SODIUM CHLORIDE 0.9 % IV SOLN
INTRAVENOUS | Status: DC
  Administered 2015-04-14: 18:00:00 via INTRAVENOUS

## 2015-04-14 MED ORDER — DEXTROSE 5 % IV SOLN
2.0000 g | Freq: Three times a day (TID) | INTRAVENOUS | Status: DC
  Administered 2015-04-14 – 2015-04-19 (×13): 2 g via INTRAVENOUS
  Filled 2015-04-14 (×18): qty 2

## 2015-04-14 MED ORDER — SODIUM CHLORIDE 0.9 % IV BOLUS (SEPSIS)
1000.0000 mL | INTRAVENOUS | Status: AC
  Administered 2015-04-14 (×2): 1000 mL via INTRAVENOUS

## 2015-04-14 MED ORDER — DEXTROSE 5 % IV SOLN
2.0000 g | Freq: Three times a day (TID) | INTRAVENOUS | Status: DC
  Administered 2015-04-14: 2 g via INTRAVENOUS
  Filled 2015-04-14 (×2): qty 2

## 2015-04-14 NOTE — ED Notes (Signed)
Report called to Vanita Ingles, RN pt being transported to room 1223

## 2015-04-14 NOTE — Progress Notes (Signed)
ANTIBIOTIC CONSULT NOTE - INITIAL  Pharmacy Consult for Vancomycin, Ceftazidime Indication: HCAP  No Known Allergies  Patient Measurements:    Vital Signs: Temp: 102.1 F (38.9 C) (09/25 1248) Temp Source: Oral (09/25 1248) BP: 144/78 mmHg (09/25 1248) Pulse Rate: 102 (09/25 1248) Intake/Output from previous day:   Intake/Output from this shift:    Labs:  Recent Labs  04/11/15 1740 04/12/15 0504  WBC 18.2* 17.2*  HGB 12.4 13.0  PLT 605* 624*   Estimated Creatinine Clearance: 58.5 mL/min (by C-G formula based on Cr of 0.73). No results for input(s): VANCOTROUGH, VANCOPEAK, VANCORANDOM, GENTTROUGH, GENTPEAK, GENTRANDOM, TOBRATROUGH, TOBRAPEAK, TOBRARND, AMIKACINPEAK, AMIKACINTROU, AMIKACIN in the last 72 hours.   Microbiology: Recent Results (from the past 720 hour(s))  Culture, blood (x 2)     Status: None   Collection Time: 03/31/15  1:30 AM  Result Value Ref Range Status   Specimen Description BLOOD RIGHT ARM  Final   Special Requests BOTTLES DRAWN AEROBIC ONLY 5CC  Final   Culture   Final    NO GROWTH 5 DAYS Performed at [facility]    Report Status 04/05/2015 FINAL  Final  Culture, blood (x 2)     Status: None   Collection Time: 03/31/15  1:50 AM  Result Value Ref Range Status   Specimen Description BLOOD RIGHT HAND  Final   Special Requests BOTTLES DRAWN AEROBIC ONLY 5CC  Final   Culture   Final    NO GROWTH 5 DAYS Performed at [facility]    Report Status 04/05/2015 FINAL  Final  Surgical pcr screen     Status: None   Collection Time: 04/04/15 10:36 PM  Result Value Ref Range Status   MRSA, PCR NEGATIVE NEGATIVE Final   Staphylococcus aureus NEGATIVE NEGATIVE Final    Comment:        The Xpert SA Assay (FDA approved for NASAL specimens in patients over 62 years of age), is one component of a comprehensive surveillance program.  Test performance has been validated by [facility] for patients greater than or equal to 21 year  old. It is not intended to diagnose infection nor to guide or monitor treatment.     Medical History: Past Medical History  Diagnosis Date  . Abnormal WBC count     low, h/o  . Other and unspecified hyperlipidemia   . Unspecified essential hypertension   . OP (osteoporosis)   . Vitamin D deficiency   . MGUS (monoclonal gammopathy of unknown significance) 03/16/2013  . Back pain   . Breast cancer 1981    mastectomy and lymph node removal  . DM type 2 (diabetes mellitus, type 2)     Pt states she is 'prediabetic'    Medications:  Anti-infectives    Start     Dose/Rate Route Frequency Ordered Stop   04/06/2015 1330  cefTAZidime (FORTAZ) 2 g in dextrose 5 % 50 mL IVPB     2 g 100 mL/hr over 30 Minutes Intravenous 3 times per day 04/02/2015 1309     04/08/2015 1315  vancomycin (VANCOCIN) IVPB 1000 mg/200 mL premix     1,000 mg 200 mL/hr over 60 Minutes Intravenous  Once 03/21/2015 1309       Assessment: 80 y.o. female with diabetes and Hx breast cancer presents from NH with fever, cough, and congestion.  Discharged from hospital 2 days prior after admission for splenectomy.  Pharmacy to dose vancomycin and ceftazidime for HCAP.  Note code sepsis called  at 1330.  1st doses of abx given in ED.  9/25 >> vancomycin >> 9/25 >> ceftazidime >>    9/25 blood: IP 9/25 urine: IP  Tmax: febrile to 102.1 WBC elevated Renal: SCr stable wnl; CrCl 59 CG No imaging yet  Goal of Therapy:  Vancomycin trough level 15-20 mcg/ml  Eradication of infection Appropriate antibiotic dosing for indication and renal function  Plan:  Day 1 antibiotics Vancomycin 1000 mg IV now, then 500 mg IV q12 hr Measure vancomycin trough levels at steady state as indicated Ceftazidime 2 g IV q8 hrs  Follow clinical course, renal function, culture results as available  Follow for de-escalation of antibiotics and LOT   Reuel Boom, PharmD, BCPS Pager: [phone] 04/03/2015, 1:24 PM

## 2015-04-14 NOTE — ED Provider Notes (Signed)
CSN: [id number]     Arrival date & time 03/26/2015  1242 History   First MD Initiated Contact with Patient 03/30/2015 1258     Chief Complaint  Patient presents with  . Fever     (Consider location/radiation/quality/duration/timing/severity/associated sxs/prior Treatment) HPI Comments: Patient here from nursing home with fever as well as cough or congestion. Discharge from hospital 2 days ago after admission for splenectomy. Patient was also displaying signs of altered mental status during hospitalization and chart was reviewed from that visit and etiology that was unsure. She denies any vomiting or diarrhea. No abdominal distention. Does endorse being short of breath. Fever at the nursing home is been treated with Tylenol. EMS called and patient transported here  Patient is a 80 y.o. female presenting with fever. The history is provided by medical records and the patient. The history is limited by the condition of the patient.  Fever   Past Medical History  Diagnosis Date  . Abnormal WBC count     low, h/o  . Other and unspecified hyperlipidemia   . Unspecified essential hypertension   . OP (osteoporosis)   . Vitamin D deficiency   . MGUS (monoclonal gammopathy of unknown significance) 03/16/2013  . Back pain   . Breast cancer 1981    mastectomy and lymph node removal  . DM type 2 (diabetes mellitus, type 2)     Pt states she is 'prediabetic'   Past Surgical History  Procedure Laterality Date  . Mastectomy      left  . Breast biopsy      right  . Breast reconstruction    . Skin cancer removal      basal cell skin cancer  . Wisdom tooth extraction    . Tooth extraction    . Laparoscopic splenectomy N/A 04/05/2015    Procedure: LAPAROSCOPIC SPLENECTOMY;  Surgeon: Johnathan Hausen, MD;  Location: [facility];  Service: General;  Laterality: N/A;   Family History  Problem Relation Age of Onset  . Liver cancer Father   . Diabetes Father    Social History  Substance Use Topics  .  Smoking status: Former Smoker -- 1.00 packs/day for 10 years    Types: Cigarettes    Start date: 05/15/1956    Quit date: 07/05/1966  . Smokeless tobacco: Never Used     Comment: quit 45 years ago  . Alcohol Use: No   OB History    No data available     Review of Systems  Unable to perform ROS Constitutional: Positive for fever.      Allergies  Review of patient's allergies indicates no known allergies.  Home Medications   Prior to Admission medications   Medication Sig Start Date End Date Taking? Authorizing Provider  acetaminophen (TYLENOL) 325 MG tablet Take 2 tablets (650 mg total) by mouth every 6 (six) hours as needed for mild pain, moderate pain, fever or headache (or Fever >/= 101). 04/12/15   Modena Jansky, MD  escitalopram (LEXAPRO) 10 MG tablet Take 5 mg by mouth every morning.     Historical Provider, MD  feeding supplement, ENSURE ENLIVE, (ENSURE ENLIVE) LIQD Take 237 mLs by mouth 2 (two) times daily between meals. 03/21/15   Belkys A Regalado, MD  fish oil-omega-3 fatty acids 1000 MG capsule Take 2 g by mouth every morning.     Historical Provider, MD  folic acid (FOLVITE) 1 MG tablet Take 2 tablets (2 mg total) by mouth daily. 04/12/15   Lenis Dickinson  Hongalgi, MD  Multiple Vitamin (MULTIVITAMIN) capsule Take 1 capsule by mouth every morning.     Historical Provider, MD  ondansetron (ZOFRAN-ODT) 4 MG disintegrating tablet Take 1 tablet (4 mg total) by mouth every 8 (eight) hours as needed for nausea or vomiting. 04/12/15   Modena Jansky, MD  pantoprazole (PROTONIX) 40 MG tablet Take 1 tablet (40 mg total) by mouth at bedtime. 04/10/15   Reyne Dumas, MD  polyethylene glycol (MIRALAX / GLYCOLAX) packet Take 17 g by mouth daily. 04/10/15   Reyne Dumas, MD  raloxifene (EVISTA) 60 MG tablet Take 60 mg by mouth every morning.     Historical Provider, MD  rosuvastatin (CRESTOR) 20 MG tablet Take 10 mg by mouth every morning.     Historical Provider, MD  simethicone (MYLICON)  80 MG chewable tablet Chew 0.5 tablets (40 mg total) by mouth every 6 (six) hours as needed for flatulence (bloating). 04/10/15   Reyne Dumas, MD  Vitamin D, Ergocalciferol, (DRISDOL) 50000 UNITS CAPS Take 50,000 Units by mouth every Monday.     Historical Provider, MD   BP 144/78 mmHg  Pulse 102  Temp(Src) 102.1 F (38.9 C) (Oral)  Resp 20  SpO2 88% Physical Exam  Constitutional: She appears well-developed and well-nourished.  Non-toxic appearance. No distress.  HENT:  Head: Normocephalic and atraumatic.  Eyes: Conjunctivae, EOM and lids are normal. Pupils are equal, round, and reactive to light.  Neck: Normal range of motion. Neck supple. No tracheal deviation present. No thyroid mass present.  Cardiovascular: Regular rhythm and normal heart sounds.  Tachycardia present.  Exam reveals no gallop.   No murmur heard. Pulmonary/Chest: Effort normal. No stridor. No respiratory distress. She has decreased breath sounds. She has wheezes. She has no rhonchi. She has no rales.  Abdominal: Soft. Normal appearance and bowel sounds are normal. She exhibits no distension. There is no tenderness. There is no rebound and no CVA tenderness.  Musculoskeletal: Normal range of motion. She exhibits no edema or tenderness.  Neurological: She is alert. She has normal strength. No cranial nerve deficit or sensory deficit. GCS eye subscore is 4. GCS verbal subscore is 5. GCS motor subscore is 6.  Skin: Skin is warm and dry. No abrasion and no rash noted.  Psychiatric: Her affect is blunt. Her speech is tangential. She is slowed.  Nursing note and vitals reviewed.   ED Course  Procedures (including critical care time) Labs Review Labs Reviewed  CULTURE, BLOOD (ROUTINE X 2)  CULTURE, BLOOD (ROUTINE X 2)  URINE CULTURE  COMPREHENSIVE METABOLIC PANEL  CBC WITH DIFFERENTIAL/PLATELET  URINALYSIS, ROUTINE W REFLEX MICROSCOPIC (NOT AT [facility])  I-STAT CG4 LACTIC ACID, ED    Imaging Review No results found. I  have personally reviewed and evaluated these images and lab results as part of my medical decision-making.   EKG Interpretation   Date/Time:  Sunday April 14 2015 13:32:11 EDT Ventricular Rate:  103 PR Interval:  132 QRS Duration: 77 QT Interval:  328 QTC Calculation: 429 R Axis:   69 Text Interpretation:  Fast sinus arrhythmia Low voltage with right axis  deviation No significant change since last tracing Confirmed by Canary Brim   MD, MARTHA [phone]) on 04/19/2015 2:57:23 PM      MDM   Final diagnoses:  None    Patient clinically dehydrated and results of x-ray noted. Patient had been empirically started on antibiotics for suspected bacteremia due to her known history of splenectomy. Patient's mental status appears to be at  baseline from prior discharge based on the notes in the chart. Mild hypokalemia noted and will supplement with potassium orally. Fever was treated with Tylenol. Will consult tried hospitalist for admission.   CRITICAL CARE Performed by: Leota Jacobsen Total critical care time: 45 Critical care time was exclusive of separately billable procedures and treating other patients. Critical care was necessary to treat or prevent imminent or life-threatening deterioration. Critical care was time spent personally by me on the following activities: development of treatment plan with patient and/or surrogate as well as nursing, discussions with consultants, evaluation of patient's response to treatment, examination of patient, obtaining history from patient or surrogate, ordering and performing treatments and interventions, ordering and review of laboratory studies, ordering and review of radiographic studies, pulse oximetry and re-evaluation of patient's condition.     Lacretia Leigh, MD 04/10/2015 [phone]

## 2015-04-14 NOTE — ED Notes (Signed)
Attempted to call report to ICU, Vanita Ingles, RN will call me back

## 2015-04-14 NOTE — H&P (Addendum)
[facility] History and Physical  [name] [name] ZJI:[id number] DOB: 1934-08-28 DOA: 04/01/2015  Referring physician: ER physician: Dr. Lacretia Leigh PCP: Jerlyn Ly, MD  Oncology: Dr. Burney Gauze   Chief Complaint: cough, fever   HPI:  80 year old female with past medical history included but not limited to MGUS, breast cancer, status post mastectomy (in 1981), has had hospitalization in 02/2015 for syncope during which time she was found to have possible metastatic disease to lung and adrenals. 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 MRI brain at that time did not show metastatic disease to brain. CT biopsy of adrenal was non diagnostic and she underwent splenectomy 04/05/2015 and pathology analysis showed minor B cell population with Kappa light chains in excess. She was discahrged to SNF with recommendation to have outpatient follow up with Dr. Marin Olp and to have H. influenza and pneumococcus vaccination 04/19/2015 because of splenectomy. Patient now presented from SNF with her daughter at the bedside who reports she was called by SNF staff that her mother will be transferred to [facility] because she had fever, cough and congestion since coming to SNF. Patient is not so good historian but says she was coughing , had fevers and felt short of breath. No chest pain or palpitations. No abdominal pain, nausea or vomiting. No lightheadedness or loss of consciousness.  In ED, BP was 90/41, HR 102, RR 20-40, T max 102.1 F and oxygen saturation 69 -100%. Currently O2 sat 100% with [place] oxygen support. Blood work was notable for potasium of 3.1, sodium 132, normal lactic acid. CXR demonstrated interval development of a small to moderate left pleural effusion with associated atelectasis and/or consolidation in the left lower lobe, large pulmonary nodule in the periphery of the right lower lobe suspicious for  metastatic disease. She was started on IV fluids in ED, given vanco and ceftazidime. She was admitted to SDU for management of sepsis due to HCAP.   Assessment & Plan    Principal Problem:   Acute respiratory failure with hypoxia / HCAP (healthcare-associated pneumonia) - Patient was recently hospitalized (03/21/15 through 04/12/2015). Her fever, cough, shortness of breath and CXR with left lower lobe consolidation are concerning for HCAP - Started on broad spectrum abc, vanco and ceftazidime - Blood cultures pending. Respiratory culture ordered. - Legionella and strep pneumonia are pending. - At this time, her respiratory status is stable - We will use Atrovent and xopenex every 8 hours scheduled as well as every 6 hours as needed for shortness of breath or wheezing   Active Problems:   Sepsis due to healthcare associated pneumonia / Left lower lobe pneumonia  - Sepsis criteria met on admission with fever, tachycardia, tachypnea, hypotension. Normal lactic acid. - Started on ceftazidime and vanco  - Sepsis work up initiated - Follow up procalcitonin level - Follow up blood culture results - Hemodynamically stable and does not require pressor support     Hypokalemia - Likely due to sepsis - Supplemented in ED - Follow up BMP tomorrow am     MGUS (monoclonal gammopathy of unknown significance) - Has had IgM MGUS - Follows with Dr. Marin Olp     Lung nodule / Adrenal nodule / S/P laparoscopic splenectomy - Again seen lung nodule in right lower lobe suspicious for metastatic disease - In regards to adrenal nodules, she underwent recent biopsy which was non diagnostic (path results showed necrosis) and MRI back in 02/2015 showed bilateral adrenal  masses with necrosis consistent with metastases.  - Will get CT chest for further evaluation if there is a progression of metastatic disease  - Pt is status post splenectomy 04/05/2015. Needs H.flu and pneumococcal vaccines 04/19/2015. - I sent  message via EPIC to Dr. Marin Olp pt is admitted to [facility].    HLD (hyperlipidemia) - Continue Crestor       Chronic diastolic heart failure - Compensated - Her last 2 D ECHO in 04/02/2015 showed EF of 55-60% with grade 1 diastolic dysfunction     Coronary artery disease / pericardial effusion  - Has had moderate side pericardiac effusion on 2 D ECHO 04/02/2015 but has been seen by cardio at that time. - She underwent Myoview on 9/15 which was negative and there was no evidence of pericardial tamponade.  - Her 12 lead EKG showed flat sinus arrhythmia which likely is consistent with her history of pericardial effusion.      Malnutrition of moderate degree / Adult failure to thrive  - In the setting of chronic illness, malignancy - Nutrition consulted    Generalized weakness - Due to chronic illness - PT eval once pt able to participate, order placed    Stage 3 sacral ulcer - WOC consulted, appreciate their assessment    DVT prophylaxis:  - SCD's bilaterally   Radiological Exams on Admission: Dg Chest 2 View 04/07/2015  1. Interval development of a small to moderate left pleural effusion with associated atelectasis and/or consolidation in the left lower lobe. 2. Mild enlargement of the cardiopericardial silhouette could suggest cardiomegaly and/or underlying pericardial effusion. 3. Large pulmonary nodule in the periphery of the right lower lobe remain suspicious for a metastatic lesion in this patient with history of breast cancer. 4. Atherosclerosis.   Electronically Signed   By: Vinnie Langton M.D.   On: 04/15/2015 14:34    EKG: I have personally reviewed EKG. EKG shows sinus arrhythmia  Code Status: Full Family Communication: Plan of care discussed with the patient and her daughter at the bedside Disposition Plan: Admit for further evaluation, SDU because of sepsis   Leisa Lenz, MD  [facility] Pager [phone]  Time spent in minutes: 75 minutes  Review of Systems:   Constitutional: Negative for fever, chills and malaise/fatigue. Negative for diaphoresis.  HENT: Negative for hearing loss, ear pain, nosebleeds, congestion, sore throat, neck pain, tinnitus and ear discharge.   Eyes: Negative for blurred vision, double vision, photophobia, pain, discharge and redness.  Respiratory: per HPI.   Cardiovascular: Negative for chest pain, palpitations, orthopnea, claudication and leg swelling.  Gastrointestinal: Negative for nausea, vomiting and abdominal pain. Negative for heartburn, constipation, blood in stool and melena.  Genitourinary: Negative for dysuria, urgency, frequency, hematuria and flank pain.  Musculoskeletal: Negative for myalgias, back pain, joint pain and falls.  Skin: Negative for itching and rash.  Neurological: Negative for dizziness and weakness. Negative for tingling, tremors, sensory change, speech change, focal weakness, loss of consciousness and headaches.  Endo/Heme/Allergies: Negative for environmental allergies and polydipsia. Does not bruise/bleed easily.  Psychiatric/Behavioral: Negative for suicidal ideas. The patient is not nervous/anxious.      Past Medical History  Diagnosis Date  . Abnormal WBC count     low, h/o  . Other and unspecified hyperlipidemia   . Unspecified essential hypertension   . OP (osteoporosis)   . Vitamin D deficiency   . MGUS (monoclonal gammopathy of unknown significance) 03/16/2013  . Back pain   . Breast cancer 1981  mastectomy and lymph node removal  . DM type 2 (diabetes mellitus, type 2)     Pt states she is 'prediabetic'   Past Surgical History  Procedure Laterality Date  . Mastectomy      left  . Breast biopsy      right  . Breast reconstruction    . Skin cancer removal      basal cell skin cancer  . Wisdom tooth extraction    . Tooth extraction    . Laparoscopic splenectomy N/A 04/05/2015    Procedure: LAPAROSCOPIC SPLENECTOMY;  Surgeon: Johnathan Hausen, MD;  Location: [facility];   Service: General;  Laterality: N/A;   Social History:  reports that she quit smoking about 48 years ago. Her smoking use included Cigarettes. She started smoking about 58 years ago. She has a 10 pack-year smoking history. She has never used smokeless tobacco. She reports that she does not drink alcohol or use illicit drugs.  No Known Allergies  Family History:  Family History  Problem Relation Age of Onset  . Liver cancer Father   . Diabetes Father      Prior to Admission medications   Medication Sig Start Date End Date Taking? Authorizing Provider  acetaminophen (TYLENOL) 325 MG tablet Take 2 tablets (650 mg total) by mouth every 6 (six) hours as needed for mild pain, moderate pain, fever or headache (or Fever >/= 101). 04/12/15  Yes Modena Jansky, MD  escitalopram (LEXAPRO) 10 MG tablet Take 5 mg by mouth every morning.    Yes Historical Provider, MD  feeding supplement, ENSURE ENLIVE, (ENSURE ENLIVE) LIQD Take 237 mLs by mouth 2 (two) times daily between meals. 03/21/15  Yes Belkys A Regalado, MD  fish oil-omega-3 fatty acids 1000 MG capsule Take 2 g by mouth every morning.    Yes Historical Provider, MD  folic acid (FOLVITE) 1 MG tablet Take 2 tablets (2 mg total) by mouth daily. 04/12/15  Yes Modena Jansky, MD  Multiple Vitamin (MULTIVITAMIN) capsule Take 1 capsule by mouth every morning.    Yes Historical Provider, MD  ondansetron (ZOFRAN-ODT) 4 MG disintegrating tablet Take 1 tablet (4 mg total) by mouth every 8 (eight) hours as needed for nausea or vomiting. 04/12/15  Yes Modena Jansky, MD  pantoprazole (PROTONIX) 40 MG tablet Take 1 tablet (40 mg total) by mouth at bedtime. 04/10/15  Yes Reyne Dumas, MD  polyethylene glycol (MIRALAX / GLYCOLAX) packet Take 17 g by mouth daily. 04/10/15  Yes Reyne Dumas, MD  raloxifene (EVISTA) 60 MG tablet Take 60 mg by mouth every morning.    Yes Historical Provider, MD  rosuvastatin (CRESTOR) 20 MG tablet Take 10 mg by mouth every morning.     Yes Historical Provider, MD  simethicone (MYLICON) 80 MG chewable tablet Chew 0.5 tablets (40 mg total) by mouth every 6 (six) hours as needed for flatulence (bloating). 04/10/15  Yes Reyne Dumas, MD  Vitamin D, Ergocalciferol, (DRISDOL) 50000 UNITS CAPS Take 50,000 Units by mouth every Monday.    Yes Historical Provider, MD   Physical Exam: Filed Vitals:   03/21/2015 1500 04/15/2015 1530 04/07/2015 1600 04/02/2015 1630  BP: 124/46 127/43 117/49 131/50  Pulse: 102 93 92 99  Temp:      TempSrc:      Resp: 35 27 27 40  SpO2: 96% 99% 98% 94%    Physical Exam  Constitutional: Appears well-developed and well-nourished. No distress.  HENT: Normocephalic. No tonsillar erythema or exudates Eyes: Conjunctivae are  normal. No scleral icterus.  Neck: Normal ROM. Neck supple. No JVD. No tracheal deviation. No thyromegaly.  CVS: , S1/S2 +, no murmurs, no gallops, no carotid bruit.  Pulmonary: Effort and breath sounds normal, no stridor, rhonchi, wheezes, rales.  Abdominal: Soft. BS +,  no distension, tenderness, rebound or guarding.  Musculoskeletal: Normal range of motion. No edema and no tenderness.  Lymphadenopathy: No lymphadenopathy noted, cervical, inguinal. Neuro: Alert. Normal reflexes, muscle tone coordination. No focal neurologic deficits. Skin: Skin is warm and dry.   Psychiatric: Normal mood and affect. Behavior, judgment, thought content normal.   Labs on Admission:  Basic Metabolic Panel:  Recent Labs Lab 04/09/15 0509 04/10/15 0831 04/11/15 0506 04/13/2015 1340  NA 140 137 138 132*  K 4.0 3.6 4.2 3.1*  CL 105 104 105 92*  CO2 26 27 26 27   GLUCOSE 117* 89 94 76  BUN 5* <5* <5* 11  CREATININE 0.72 0.72 0.73 0.89  CALCIUM 8.3* 8.1* 8.2* 8.2*   Liver Function Tests:  Recent Labs Lab 04/09/15 0509 04/10/15 0831 04/11/15 0506 03/28/2015 1340  AST 48* 44* 47* 66*  ALT 21 20 17 24   ALKPHOS 78 79 72 146*  BILITOT 0.4 0.5 0.6 1.1  PROT 4.5* 4.7* 4.4* 5.3*  ALBUMIN 2.3* 2.3*  2.2* 2.5*   No results for input(s): LIPASE, AMYLASE in the last 168 hours. No results for input(s): AMMONIA in the last 168 hours. CBC:  Recent Labs Lab 04/10/15 0831 04/11/15 0506 04/11/15 1740 04/12/15 0504 04/17/2015 1340  WBC 12.3* 12.9* 18.2* 17.2* 9.0  NEUTROABS  --   --   --   --  7.9*  HGB 8.6* 8.0* 12.4 13.0 12.2  HCT 27.7* 24.6* 36.4 39.1 37.5  MCV 97.2 98.0 93.3 93.3 93.5  PLT 634* 369 605* 624* 440*   Cardiac Enzymes: No results for input(s): CKTOTAL, CKMB, CKMBINDEX, TROPONINI in the last 168 hours. BNP: Invalid input(s): POCBNP CBG: No results for input(s): GLUCAP in the last 168 hours.  If 7PM-7AM, please contact night-coverage [email or web address] Password [facility] 04/02/2015, 4:42 PM

## 2015-04-14 NOTE — ED Notes (Signed)
Bed: WA09 Expected date: 04/09/2015 Expected time: 12:34 PM Means of arrival: Ambulance Comments: Fever 103.1 elderly  cough

## 2015-04-14 NOTE — ED Notes (Signed)
Patient transported to X-ray 

## 2015-04-14 NOTE — Plan of Care (Signed)
Problem: Consults Goal: Skin Care Protocol Initiated - if Braden Score 18 or less If consults are not indicated, leave blank or document N/A Outcome: Progressing Sacral foam dsg, heel protectors applied on admission

## 2015-04-14 NOTE — ED Notes (Signed)
Unable to obtain iv access in right arm.  Attempted by two RN's.  Advised dr. Zenia Resides that patients left arm restricted due to breast surgery greater than 5 years ago.  Patient advises [place] that she has no problem with swelling in that arm. Per dr. Zenia Resides, ok to use left arm to obtain iv access as patient is septic

## 2015-04-14 NOTE — ED Notes (Addendum)
Pt had lap splenectomy by dr. Hassell Done on Friday 09/16 was dc'd to rehab.  Since Friday 09/23 has been running a fever off and on up to 103.1.

## 2015-04-15 ENCOUNTER — Inpatient Hospital Stay (HOSPITAL_COMMUNITY): Payer: Medicare Other

## 2015-04-15 ENCOUNTER — Encounter (HOSPITAL_COMMUNITY): Payer: Self-pay | Admitting: Radiology

## 2015-04-15 DIAGNOSIS — R000 Tachycardia, unspecified: Secondary | ICD-10-CM

## 2015-04-15 DIAGNOSIS — I959 Hypotension, unspecified: Secondary | ICD-10-CM

## 2015-04-15 DIAGNOSIS — I314 Cardiac tamponade: Secondary | ICD-10-CM | POA: Diagnosis present

## 2015-04-15 DIAGNOSIS — Z853 Personal history of malignant neoplasm of breast: Secondary | ICD-10-CM

## 2015-04-15 DIAGNOSIS — J189 Pneumonia, unspecified organism: Secondary | ICD-10-CM

## 2015-04-15 DIAGNOSIS — E279 Disorder of adrenal gland, unspecified: Secondary | ICD-10-CM

## 2015-04-15 DIAGNOSIS — R918 Other nonspecific abnormal finding of lung field: Secondary | ICD-10-CM

## 2015-04-15 DIAGNOSIS — J9601 Acute respiratory failure with hypoxia: Secondary | ICD-10-CM

## 2015-04-15 DIAGNOSIS — E440 Moderate protein-calorie malnutrition: Secondary | ICD-10-CM

## 2015-04-15 DIAGNOSIS — R0682 Tachypnea, not elsewhere classified: Secondary | ICD-10-CM

## 2015-04-15 DIAGNOSIS — J90 Pleural effusion, not elsewhere classified: Secondary | ICD-10-CM

## 2015-04-15 LAB — CBC
HEMATOCRIT: 34.5 % — AB (ref 36.0–46.0)
Hemoglobin: 11.6 g/dL — ABNORMAL LOW (ref 12.0–15.0)
MCH: 32 pg (ref 26.0–34.0)
MCHC: 33.6 g/dL (ref 30.0–36.0)
MCV: 95 fL (ref 78.0–100.0)
Platelets: 276 10*3/uL (ref 150–400)
RBC: 3.63 MIL/uL — AB (ref 3.87–5.11)
RDW: 14.5 % (ref 11.5–15.5)
WBC: 6.8 10*3/uL (ref 4.0–10.5)

## 2015-04-15 LAB — COMPREHENSIVE METABOLIC PANEL
ALBUMIN: 2.1 g/dL — AB (ref 3.5–5.0)
ALT: 20 U/L (ref 14–54)
AST: 60 U/L — AB (ref 15–41)
Alkaline Phosphatase: 131 U/L — ABNORMAL HIGH (ref 38–126)
Anion gap: 10 (ref 5–15)
BUN: 9 mg/dL (ref 6–20)
CHLORIDE: 96 mmol/L — AB (ref 101–111)
CO2: 26 mmol/L (ref 22–32)
Calcium: 7.8 mg/dL — ABNORMAL LOW (ref 8.9–10.3)
Creatinine, Ser: 0.67 mg/dL (ref 0.44–1.00)
GFR calc Af Amer: >60 mL/min (ref >60)
Glucose, Bld: 79 mg/dL (ref 65–99)
POTASSIUM: 3.1 mmol/L — AB (ref 3.5–5.1)
SODIUM: 132 mmol/L — AB (ref 135–145)
Total Bilirubin: 0.8 mg/dL (ref 0.3–1.2)
Total Protein: 4.7 g/dL — ABNORMAL LOW (ref 6.5–8.1)

## 2015-04-15 LAB — URINE CULTURE: Culture: NO GROWTH

## 2015-04-15 LAB — EXPECTORATED SPUTUM ASSESSMENT W GRAM STAIN, RFLX TO RESP C

## 2015-04-15 LAB — STREP PNEUMONIAE URINARY ANTIGEN: STREP PNEUMO URINARY ANTIGEN: NEGATIVE

## 2015-04-15 LAB — LACTATE DEHYDROGENASE: LDH: 179 U/L (ref 98–192)

## 2015-04-15 LAB — BODY FLUID CELL COUNT WITH DIFFERENTIAL
LYMPHS FL: 26 %
MONOCYTE-MACROPHAGE-SEROUS FLUID: 32 % — AB (ref 50–90)
NEUTROPHIL FLUID: 42 % — AB (ref 0–25)
Total Nucleated Cell Count, Fluid: 89 cu mm (ref 0–1000)

## 2015-04-15 LAB — PROTEIN, TOTAL: TOTAL PROTEIN: 4.1 g/dL — AB (ref 6.5–8.1)

## 2015-04-15 LAB — GLUCOSE, CAPILLARY: GLUCOSE-CAPILLARY: 76 mg/dL (ref 65–99)

## 2015-04-15 LAB — CHOLESTEROL, TOTAL: Cholesterol: 106 mg/dL (ref 0–200)

## 2015-04-15 LAB — LACTATE DEHYDROGENASE, PLEURAL OR PERITONEAL FLUID: LD FL: 67 U/L — AB (ref 3–23)

## 2015-04-15 LAB — HIV ANTIBODY (ROUTINE TESTING W REFLEX): HIV Screen 4th Generation wRfx: NONREACTIVE

## 2015-04-15 LAB — EXPECTORATED SPUTUM ASSESSMENT W REFEX TO RESP CULTURE

## 2015-04-15 MED ORDER — IPRATROPIUM-ALBUTEROL 0.5-2.5 (3) MG/3ML IN SOLN
3.0000 mL | Freq: Four times a day (QID) | RESPIRATORY_TRACT | Status: DC
  Administered 2015-04-16 – 2015-04-17 (×5): 3 mL via RESPIRATORY_TRACT
  Filled 2015-04-15 (×5): qty 3

## 2015-04-15 MED ORDER — IOHEXOL 300 MG/ML  SOLN
75.0000 mL | Freq: Once | INTRAMUSCULAR | Status: AC | PRN
  Administered 2015-04-15: 75 mL via INTRAVENOUS

## 2015-04-15 MED ORDER — CETYLPYRIDINIUM CHLORIDE 0.05 % MT LIQD
7.0000 mL | Freq: Two times a day (BID) | OROMUCOSAL | Status: DC
  Administered 2015-04-15 – 2015-04-16 (×3): 7 mL via OROMUCOSAL

## 2015-04-15 MED ORDER — LEVALBUTEROL HCL 0.63 MG/3ML IN NEBU
0.6300 mg | INHALATION_SOLUTION | RESPIRATORY_TRACT | Status: DC | PRN
  Administered 2015-04-26 (×2): 0.63 mg via RESPIRATORY_TRACT
  Filled 2015-04-15 (×3): qty 3

## 2015-04-15 MED ORDER — GUAIFENESIN-DM 100-10 MG/5ML PO SYRP
5.0000 mL | ORAL_SOLUTION | ORAL | Status: DC | PRN
  Administered 2015-04-15 – 2015-04-20 (×3): 5 mL via ORAL
  Filled 2015-04-15: qty 5
  Filled 2015-04-15: qty 10
  Filled 2015-04-15 (×2): qty 5

## 2015-04-15 MED ORDER — SODIUM CHLORIDE 0.9 % IV SOLN
510.0000 mg | Freq: Once | INTRAVENOUS | Status: AC
  Administered 2015-04-15: 510 mg via INTRAVENOUS
  Filled 2015-04-15: qty 17

## 2015-04-15 NOTE — Consult Note (Signed)
Referral MD  Reason for Referral: Pneumonia   Chief Complaint  Patient presents with  . Fever  : I just left the hospital but now I am back with pneumonia.  HPI: [name] is well-known to me. She is a 80 year old white female. She was just discharged from the hospital 2 days ago. She was hospitalized with pancytopenia. She ultimately underwent a splenectomy. The pathology tonsillectomy did not show any obvious malignancy although a low-grade type of lymphoma cannot be ruled out. 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 She was sent to a rehabilitation center to get stronger before she went home.  Apparently, she had a temperature. She was brought back. She had a chest x-ray done which showed a moderate to large left pleural effusion.  She's not complaining of any shortness of breath. There is no obvious cough.  She's had no abdominal pain. She did undergo laparoscopy for the spleen removal. She has some bleeding while in the hospital at the laparoscopy site.  After having recently taken out, her blood counts normalized. She was transfused with 2 units of blood prior to discharge because of anemia. She was to get some IV iron which she had not gotten yet.  Her labs are relatively unrevealing. Her white count 6.8. Hemoglobin 11.6. Platelet count 276,000. Her sodium is 132 with a potassium of 3.1. Calcium 7.8. Her total protein is 4.7 with an albumin of 2.1.  She's had no rashes. She's had no nausea or vomiting. Her appetite is down a little bit.    Past Medical History  Diagnosis Date  . Abnormal WBC count     low, h/o  . Other and unspecified hyperlipidemia   . Unspecified essential hypertension   . OP (osteoporosis)   . Vitamin D deficiency   . MGUS (monoclonal gammopathy of unknown significance) 03/16/2013  . Back pain   . Breast cancer 1981    mastectomy and lymph node removal  . DM type 2 (diabetes mellitus, type 2)     Pt states she is 'prediabetic'  :  Past Surgical  History  Procedure Laterality Date  . Mastectomy      left  . Breast biopsy      right  . Breast reconstruction    . Skin cancer removal      basal cell skin cancer  . Wisdom tooth extraction    . Tooth extraction    . Laparoscopic splenectomy N/A 04/05/2015    Procedure: LAPAROSCOPIC SPLENECTOMY;  Surgeon: Johnathan Hausen, MD;  Location: [facility];  Service: General;  Laterality: N/A;  :   Current facility-administered medications:  .  0.9 %  sodium chloride infusion, , Intravenous, Continuous, Robbie Lis, MD, Last Rate: 75 mL/hr at 04/16/2015 1808 .  acetaminophen (TYLENOL) tablet 650 mg, 650 mg, Oral, Q6H PRN, Robbie Lis, MD, 650 mg at 04/15/15 0027 .  antiseptic oral rinse (CPC / CETYLPYRIDINIUM CHLORIDE 0.05%) solution 7 mL, 7 mL, Mouth Rinse, BID, Robbie Lis, MD .  cefTAZidime (FORTAZ) 2 g in dextrose 5 % 50 mL IVPB, 2 g, Intravenous, 3 times per day, Garnet Sierras, RPH, 2 g at 04/15/15 0507 .  escitalopram (LEXAPRO) tablet 5 mg, 5 mg, Oral, q morning - 10a, Robbie Lis, MD .  feeding supplement (ENSURE ENLIVE) (ENSURE ENLIVE) liquid 237 mL, 237 mL, Oral, BID BM, Robbie Lis, MD .  folic acid (FOLVITE) tablet 2 mg, 2 mg, Oral, Daily, Robbie Lis, MD .  guaiFENesin-dextromethorphan [facility] DM)  100-10 MG/5ML syrup 5 mL, 5 mL, Oral, Q4H PRN, Dianne Dun, NP, 5 mL at 04/15/15 0141 .  ipratropium (ATROVENT) nebulizer solution 0.5 mg, 0.5 mg, Nebulization, Q6H PRN, Robbie Lis, MD .  ipratropium (ATROVENT) nebulizer solution 0.5 mg, 0.5 mg, Nebulization, TID, Robbie Lis, MD, 0.5 mg at 04/15/15 0718 .  levalbuterol (XOPENEX) nebulizer solution 0.63 mg, 0.63 mg, Nebulization, Q6H PRN, Robbie Lis, MD .  levalbuterol [facility]) nebulizer solution 0.63 mg, 0.63 mg, Nebulization, TID, Robbie Lis, MD, 0.63 mg at 04/15/15 5916 .  multivitamin with minerals tablet 1 tablet, 1 tablet, Oral, Daily, Robbie Lis, MD .  omega-3 acid ethyl esters (LOVAZA) capsule 1  g, 1 g, Oral, BID, Robbie Lis, MD .  ondansetron [facility]) tablet 4 mg, 4 mg, Oral, Q6H PRN **OR** ondansetron (ZOFRAN) injection 4 mg, 4 mg, Intravenous, Q6H PRN, Robbie Lis, MD .  pantoprazole (PROTONIX) EC tablet 40 mg, 40 mg, Oral, QHS, Robbie Lis, MD, 40 mg at 04/10/2015 2110 .  raloxifene (EVISTA) tablet 60 mg, 60 mg, Oral, q morning - 10a, Robbie Lis, MD .  rosuvastatin (CRESTOR) tablet 10 mg, 10 mg, Oral, q morning - 10a, Robbie Lis, MD .  simethicone [facility]) chewable tablet 40 mg, 40 mg, Oral, Q6H PRN, Robbie Lis, MD .  sodium chloride 0.9 % injection 3 mL, 3 mL, Intravenous, Q12H, Robbie Lis, MD, 3 mL at 04/09/2015 2200 .  vancomycin (VANCOCIN) 500 mg in sodium chloride 0.9 % 100 mL IVPB, 500 mg, Intravenous, Q12H, Audrea Muscat Frens, RPH, 500 mg at 04/15/15 0253:  . antiseptic oral rinse  7 mL Mouth Rinse BID  . cefTAZidime (FORTAZ)  IV  2 g Intravenous 3 times per day  . escitalopram  5 mg Oral q morning - 10a  . feeding supplement (ENSURE ENLIVE)  237 mL Oral BID BM  . folic acid  2 mg Oral Daily  . ipratropium  0.5 mg Nebulization TID  . levalbuterol  0.63 mg Nebulization TID  . multivitamin with minerals  1 tablet Oral Daily  . omega-3 acid ethyl esters  1 g Oral BID  . pantoprazole  40 mg Oral QHS  . raloxifene  60 mg Oral q morning - 10a  . rosuvastatin  10 mg Oral q morning - 10a  . sodium chloride  3 mL Intravenous Q12H  . vancomycin  500 mg Intravenous Q12H  :  No Known Allergies:  Family History  Problem Relation Age of Onset  . Liver cancer Father   . Diabetes Father   :  Social History   Social History  . Marital Status: Widowed    Spouse Name: N/A  . Number of Children: 2  . Years of Education: N/A   Occupational History  . Not on file.   Social History Main Topics  . Smoking status: Former Smoker -- 1.00 packs/day for 10 years    Types: Cigarettes    Start date: 05/15/1956    Quit date: 07/05/1966  . Smokeless tobacco: Never Used      Comment: quit 45 years ago  . Alcohol Use: No  . Drug Use: No  . Sexual Activity: No   Other Topics Concern  . Not on file   Social History Narrative  :  Pertinent items are noted in HPI.  Exam: Patient Vitals for the past 24 hrs:  BP Temp Temp src Pulse Resp SpO2 Height Weight  04/15/15 0800 -  99.6 F (37.6 C) Oral - - - - -  04/15/15 0718 - - - - - 97 % - -  04/15/15 0600 - - - 93 (!) 26 99 % - -  04/15/15 0500 - - - 97 (!) 31 99 % - -  04/15/15 0400 (!) 141/42 mmHg 99.2 F (37.3 C) Oral 92 (!) 26 98 % - 148 lb 13 oz (67.5 kg)  04/15/15 0300 - - - 97 (!) 29 95 % - -  04/15/15 0200 - - - (!) 106 (!) 32 96 % - -  04/15/15 0100 - - - (!) 111 (!) 33 96 % - -  04/15/15 0000 (!) 176/56 mmHg (!) 101.1 F (38.4 C) Oral (!) 104 (!) 25 97 % - -  03/23/2015 2300 - - - 98 (!) 31 97 % - -  04/06/2015 2200 - - - 95 (!) 22 99 % - -  03/31/2015 2100 - - - 92 (!) 31 98 % - -  03/23/2015 2040 (!) 145/51 mmHg - - 95 17 99 % - -  04/12/2015 2000 - 98.1 F (36.7 C) Oral 92 (!) 25 99 % - -  04/15/2015 1928 - - - - - 100 % - -  03/25/2015 1814 - - - - - - 5\' 8"  (1.727 m) 146 lb 13.2 oz (66.6 kg)  03/24/2015 1708 (!) 118/49 mmHg 98.2 F (36.8 C) Oral 94 26 99 % - -  04/04/2015 1700 (!) 118/49 mmHg - - - (!) 28 - - -  03/22/2015 1630 (!) 131/50 mmHg - - 99 (!) 40 94 % - -  04/10/2015 1600 (!) 117/49 mmHg - - 92 (!) 27 98 % - -  04/09/2015 1530 (!) 127/43 mmHg - - 93 (!) 27 99 % - -  04/16/2015 1500 (!) 124/46 mmHg - - 102 (!) 35 96 % - -  04/13/2015 1433 (!) 118/49 mmHg - - 101 20 98 % - -  04/13/2015 1430 (!) 90/41 mmHg - - 100 25 100 % - -  04/07/2015 1404 161/55 mmHg 100.7 F (38.2 C) Oral 101 (!) 36 100 % - -  04/10/2015 1300 153/60 mmHg - - 100 (!) 34 99 % - -  04/09/2015 1250 - - - - - (!) 88 % - -  03/25/2015 1248 144/78 mmHg 102.1 F (38.9 C) Oral (!) 27 20 (!) 69 % - -    well-developed and well-nourished white female. Head and neck exam shows no ocular or oral lesions. There are no palpable cervical or  supraclavicular lymph nodes. Lungs show some decrease over on the left side. She has pretty decent air movement bilaterally. Cardiac exam regular rate and rhythm with normal S1 and S2. There are no murmurs, rubs or bruits. Abdomen is soft. She has good bowel sounds. There is no fluid wave. There is no palpable hepatomegaly. She still has the abdominal dressing over the left side. Extremity shows no clubbing, cyanosis or edema. Skin exam shows no rashes.    Recent Labs  04/17/2015 2115 04/15/15 0515  WBC 7.6 6.8  HGB 11.4* 11.6*  HCT 34.6* 34.5*  PLT 379 276    Recent Labs  04/05/2015 2115 04/15/15 0515  NA 130* 132*  K 3.2* 3.1*  CL 96* 96*  CO2 26 26  GLUCOSE 85 79  BUN 9 9  CREATININE 0.70 0.67  CALCIUM 7.5* 7.8*    Blood smear review: None  Pathology: None     Assessment and Plan:  Ms. Fehrenbach is a 80 year old white female. She underwent splenectomy about 2 weeks ago. She was just discharged patient now is back with what appears be pneumonia.  I think we are going to have to do a thoracentesis on her. I do worry about the possibly of malignancy causing this pleural effusion. She has a remote history of a breast cancer. She has a lung nodule I think in the right lung.  Again, her blood counts have normalized after having her spleen taken out. This is certainly encouraging.  She was supposed iron while she is in the hospital last week. This cannot be done. As such, we will give her a dose now.  I appreciate everybody's care. I just feel bad for her as she has been in the hospital for the majority of September.  We will follow her along and provide recommendations as needed.  [name].

## 2015-04-15 NOTE — Progress Notes (Signed)
  Echocardiogram 2D Echocardiogram has been performed.  [name] [name] 04/15/2015, 4:24 PM

## 2015-04-15 NOTE — Progress Notes (Signed)
eLink Physician-Brief Progress Note Patient Name: [name] [name] DOB: 12-Jul-1935 MRN: [id number]   Date of Service  04/15/2015  HPI/Events of Note  Pt with pericardial tamponade on echo, needs a pericardial window, will tfr to [facility]  eICU Interventions  See orders , [facility] aware      Intervention Category Major Interventions: Other:  Asencion Noble 04/15/2015, 8:27 PM

## 2015-04-15 NOTE — Progress Notes (Signed)
Report given to 2heart RN. Pt left unit via strecther with carelink, daughter walking with pt. No acute distress noted.

## 2015-04-15 NOTE — Consult Note (Signed)
CARDIOLOGY CONSULT NOTE  Patient ID: Gyneth Hubka MRN: [id number] DOB/AGE: 09/20/1934 80 y.o.  Admit date: 04/04/2015 Reason for Consultation: Pericardial tamponade  HPI: 80 yo with history of prior breast cancer, MGUS, pancytopenia s/p splenectomy was admitted with suspected PNA + left pleural effusion.  She was found to have pericardial tamponade.    There has been concern for metastatic disease of unknown primary with RLL lung nodule and adrenal nodules.  Adrenal biopsy was nondiagnostic.  She was hospitalized earlier this month with pancytopenia. She ended up having splenectomy.  Her counts recovered after splenectomy.  Path did not show a definite malignancy, cannot rule out a low grade lymphoma.  She was sent to rehab but developed a fever and was readmitted.  CT at admission showed a moderate left effusion along with possible LLL PNA.  She was additionally noted to have a moderate pericardial effusion.  She was started on antibiotics.  She had a thoracentesis today, fluid was transudative by LDH criterion, does not appear that pleural fluid protein was sent. She had an echo today also which showed normal EF 60-65%, but there was evidence for pericardial tamponade with a large effusion.   Currently, she feels ok.  She is not short of breath in bed.  She is not lightheaded. HR around 100 with preserved BP.  Low voltage on ECG.    Review of systems complete and found to be negative unless listed above in HPI  Past Medical History: 1. MGUS 2. Possible metastatic disease of unknown primary to lung (RLL nodule) and bilateral adrenals (enlarged).  Adrenal biopsy was nondiagnostic. 3. Pancytopenia s/p splenectomy in 9/16.  Path from splenectomy showed no obvious malignancy, but cannot rule out low grade lymphoma.  There was extramedullary hematopoiesis.   4. H/o breast cancer s/p mastectomy in 1981.  5. HTN 6. Cardiolite (9/16) with normal EF, no ischemia or infarction.     Family History    Problem Relation Age of Onset  . Liver cancer Father   . Diabetes Father     Social History   Social History  . Marital Status: Widowed    Spouse Name: N/A  . Number of Children: 2  . Years of Education: N/A   Occupational History  . Not on file.   Social History Main Topics  . Smoking status: Former Smoker -- 1.00 packs/day for 10 years    Types: Cigarettes    Start date: 05/15/1956    Quit date: 07/05/1966  . Smokeless tobacco: Never Used     Comment: quit 45 years ago  . Alcohol Use: No  . Drug Use: No  . Sexual Activity: No   Other Topics Concern  . Not on file   Social History Narrative     Prescriptions prior to admission  Medication Sig Dispense Refill Last Dose  . acetaminophen (TYLENOL) 325 MG tablet Take 2 tablets (650 mg total) by mouth every 6 (six) hours as needed for mild pain, moderate pain, fever or headache (or Fever >/= 101).   03/24/2015 at Unknown time  . escitalopram (LEXAPRO) 10 MG tablet Take 5 mg by mouth every morning.    04/10/2015 at Unknown time  . feeding supplement, ENSURE ENLIVE, (ENSURE ENLIVE) LIQD Take 237 mLs by mouth 2 (two) times daily between meals. 237 mL 12 04/06/2015 at Unknown time  . fish oil-omega-3 fatty acids 1000 MG capsule Take 2 g by mouth every morning.    04/18/2015 at Unknown time  . folic  acid (FOLVITE) 1 MG tablet Take 2 tablets (2 mg total) by mouth daily.   04/15/2015 at Unknown time  . Multiple Vitamin (MULTIVITAMIN) capsule Take 1 capsule by mouth every morning.    04/05/2015 at Unknown time  . ondansetron (ZOFRAN-ODT) 4 MG disintegrating tablet Take 1 tablet (4 mg total) by mouth every 8 (eight) hours as needed for nausea or vomiting.   unknown  . pantoprazole (PROTONIX) 40 MG tablet Take 1 tablet (40 mg total) by mouth at bedtime. 30 tablet 0 04/13/2015 at Unknown time  . polyethylene glycol (MIRALAX / GLYCOLAX) packet Take 17 g by mouth daily. 14 each 0 04/03/2015 at Unknown time  . raloxifene (EVISTA) 60 MG tablet Take  60 mg by mouth every morning.    03/21/2015 at Unknown time  . rosuvastatin (CRESTOR) 20 MG tablet Take 10 mg by mouth every morning.    03/27/2015 at Unknown time  . simethicone (MYLICON) 80 MG chewable tablet Chew 0.5 tablets (40 mg total) by mouth every 6 (six) hours as needed for flatulence (bloating). 30 tablet 0 Past Week at Unknown time  . Vitamin D, Ergocalciferol, (DRISDOL) 50000 UNITS CAPS Take 50,000 Units by mouth every Monday.    Past Week at Unknown time   Current Scheduled Meds: . antiseptic oral rinse  7 mL Mouth Rinse BID  . cefTAZidime (FORTAZ)  IV  2 g Intravenous 3 times per day  . escitalopram  5 mg Oral q morning - 10a  . feeding supplement (ENSURE ENLIVE)  237 mL Oral BID BM  . folic acid  2 mg Oral Daily  . [START ON 03/29/2015] ipratropium-albuterol  3 mL Nebulization Q6H  . multivitamin with minerals  1 tablet Oral Daily  . omega-3 acid ethyl esters  1 g Oral BID  . pantoprazole  40 mg Oral QHS  . raloxifene  60 mg Oral q morning - 10a  . rosuvastatin  10 mg Oral q morning - 10a  . sodium chloride  3 mL Intravenous Q12H  . vancomycin  500 mg Intravenous Q12H   Continuous Infusions: . sodium chloride 10 mL/hr at 04/15/15 2230   PRN Meds:.acetaminophen, guaiFENesin-dextromethorphan, levalbuterol, ondansetron **OR** ondansetron (ZOFRAN) IV, simethicone   Physical exam Blood pressure 128/39, pulse 106, temperature 98.2 F (36.8 C), temperature source Oral, resp. rate 31, height 5' 8.5" (1.74 m), weight 148 lb 9.4 oz (67.4 kg), SpO2 93 %. General: NAD Neck: JVP 10-12 cm, no thyromegaly or thyroid nodule.  Lungs: Crackles and rhonchi at bases bilaterally.  CV: Nondisplaced PMI.  Heart mildly tachy, regular S1/S2, no S3/S4, no murmur.  1+ ankle edema bilaterally.   Abdomen: Soft, nontender, no hepatosplenomegaly, no distention.  Skin: Intact without lesions or rashes.  Neurologic: Alert and oriented x 3.  Psych: Normal affect. Extremities: No clubbing or cyanosis.   HEENT: Normal.   Labs:   Lab Results  Component Value Date   WBC 6.8 04/15/2015   HGB 11.6* 04/15/2015   HCT 34.5* 04/15/2015   MCV 95.0 04/15/2015   PLT 276 04/15/2015    Recent Labs Lab 04/15/15 0515 04/15/15 1424  NA 132*  --   K 3.1*  --   CL 96*  --   CO2 26  --   BUN 9  --   CREATININE 0.67  --   CALCIUM 7.8*  --   PROT 4.7* 4.1*  BILITOT 0.8  --   ALKPHOS 131*  --   ALT 20  --   AST 60*  --  GLUCOSE 79  --    Lab Results  Component Value Date   TROPONINI 0.03 03/16/2015     Radiology: - CT chest with large RLL pulmonary nodule, moderate left effusion with consolidation LLL, moderate pericardial effusion.  EKG: NSR with PAC, low voltage  ASSESSMENT AND PLAN: 80 yo with history of prior breast cancer, MGUS, pancytopenia s/p splenectomy was admitted with suspected PNA + left pleural effusion.  She was found to have pericardial tamponade.   1. Pericardial tamponade: Evident on today's echo.  ECG with low voltage.  Exam shows prominent JVD.  She is hemodynamically stable currently with mild sinus tachycardia and preserved BP.  Most likely given her overall presentation, this is a malignant effusion.  With this in mind, I think that she would be best served with a pericardial window.  She does not need an emergent procedure but it will need to be in the next 24 hours or so.   - Monitor in CCU for decompensation. - Will need to consult cardiac surgery in the morning for pericardial window, keep NPO.  2. PNA: HCAP.  She was febrile to 102 today.  Continue abx per primary service.  3. Pancytopenia: Resolved s/p splenectomy.  4. Pleural effusion: Transudate by LDH, fluid protein apparently not send.  Await cytology.  5. Oncology: Suspect there is an underlying malignancy.  RLL nodule and adrenal nodules have not been characterized.  She has a history of prior breast cancer, also MGUS.   Loralie Champagne 04/15/2015 11:57 PM

## 2015-04-15 NOTE — Evaluation (Signed)
Physical Therapy Evaluation Patient Details Name: [name] [name] MRN: [id number] DOB: [birth year] Today's Date: 04/15/2015   History of Present Illness  80 yo female adm 03/27/2015 with ARF/HCAP, recent adm a few days ago for mental status changes and weakness, laproscopic splenectomy 04/05/15 ; PMHx: syncope, pancytopenia, dCHF, breast CA, lung nodules, depression,  Clinical Impression  Pt will benefit from PT to address deficits below; will likely need SNF placement at D/C; no family present at time of eval   Follow Up Recommendations SNF;Supervision/Assistance - 24 hour    Equipment Recommendations  None recommended by PT    Recommendations for Other Services       Precautions / Restrictions Precautions Precautions: Fall      Mobility  Bed Mobility Overal bed mobility: Needs Assistance Bed Mobility: Supine to Sit     Supine to sit: Min assist Sit to supine: Min assist;Mod assist   General bed mobility comments: assist with trunk to come fully upright, assist with LEs back onto bed, incr time  Transfers Overall transfer level: Needs assistance Equipment used: 2 person hand held assist;1 person hand held assist Transfers: Sit to/from Stand Sit to Stand: Min assist;+2 safety/equipment         General transfer comment: cues for safety; hand placement  Ambulation/Gait Ambulation/Gait assistance: Min assist Ambulation Distance (Feet): 20 Feet Assistive device: 2 person hand held assist Gait Pattern/deviations: Step-through pattern;Decreased stride length     General Gait Details: pt fatigues quickly, requires assist for balance and  HR 106-->120-->108; O2 sats 97% 2L to RA and back to 2L; RR 27-->33-->27  Stairs            Wheelchair Mobility    Modified Rankin (Stroke Patients Only)       Balance             Standing balance-Leahy Scale: Poor                               Pertinent Vitals/Pain Pain Assessment: No/denies pain     Home Living Family/patient expects to be discharged to:: Skilled nursing facility Living Arrangements: Alone Available Help at Discharge: Family;Friend(s);Available PRN/intermittently Type of Home: House Home Access: Stairs to enter Entrance Stairs-Rails: None Entrance Stairs-Number of Steps: 2-3 Home Layout: One level Home Equipment: None Additional Comments: per previous admission    Prior Function Level of Independence: Independent         Comments: pt reports recently weaker and needing more assist     Hand Dominance        Extremity/Trunk Assessment   Upper Extremity Assessment: Generalized weakness           Lower Extremity Assessment: Generalized weakness         Communication   Communication: No difficulties  Cognition Arousal/Alertness: Awake/alert Behavior During Therapy: WFL for tasks assessed/performed Overall Cognitive Status: No family/caregiver present to determine baseline cognitive functioning         Following Commands: Follows one step commands inconsistently;Follows one step commands with increased time            General Comments      Exercises        Assessment/Plan    PT Assessment Patient needs continued PT services  PT Diagnosis Difficulty walking;Generalized weakness   PT Problem List Decreased strength;Decreased balance;Decreased mobility;Decreased knowledge of use of DME;Decreased safety awareness;Decreased cognition;Decreased activity tolerance  PT Treatment Interventions Gait training;Functional mobility training;Therapeutic activities;Therapeutic exercise;DME instruction;Balance  training;Patient/family education   PT Goals (Current goals can be found in the Care Plan section) Acute Rehab PT Goals Patient Stated Goal: pt does not state  PT Goal Formulation: With patient Time For Goal Achievement: 04/22/15 Potential to Achieve Goals: Good    Frequency Min 3X/week   Barriers to discharge         Co-evaluation               End of Session   Activity Tolerance: Patient limited by fatigue Patient left: with call bell/phone within reach;in bed;with bed alarm set Nurse Communication: Mobility status         Time: 1511-1531 PT Time Calculation (min) (ACUTE ONLY): 20 min   Charges:   PT Evaluation $Initial PT Evaluation Tier I: 1 Procedure     PT G CodesKenyon Ana 05-13-2015, 3:54 PM

## 2015-04-15 NOTE — Progress Notes (Deleted)
Spoke with daughter at bedside and explained transfer to [facility]. Daughter verbalized understanding.

## 2015-04-15 NOTE — Progress Notes (Signed)
Initial Nutrition Assessment  DOCUMENTATION CODES:   Severe malnutrition in context of acute illness/injury  INTERVENTION:   -Continue Ensure Enlive po BID, each supplement provides 350 kcal and 20 grams of protein -Provide Magic cup BID with meals, each supplement provides 290 kcal and 9 grams of protein -Encourage PO intake -RD to continue to monitor  NUTRITION DIAGNOSIS:   Malnutrition related to acute illness as evidenced by moderate depletions of muscle mass, energy intake < or equal to 50% for > or equal to 5 days.  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 She was admitted on 9/25 for increased fever, cough and congestion coming from her skilled nursing facility and admitted for sepsis secondary to pneumonia causing acute respiratory failure.  Pt in room with daughter at bedside. Per daughter, pt has not eaten much since she left the hospital 2 days ago. She has not eaten at all since admission. [name] appetite was improving during her last admission. Pt would like to continue to receive Ensures and Magic cups with meals. RD to order. Per weight history, pt's weight has remained stable.   Pt with moderate muscle depletion.   Labs reviewed: Low Na, K, Mg Phos WNL  Diet Order:     Skin:    Stage II pressure ulcers -elbow, buttocks, deep tissue ulcer on heel  Last BM:   9/25  Height:   Ht Readings from Last 1 Encounters:  03/25/2015 5\' 8"  (1.727 m)    Weight:   Wt Readings from Last 1 Encounters:  04/15/15 148 lb 13 oz (67.5 kg)    Ideal Body Weight:  63.64 kg  BMI:  Body mass index is 22.63 kg/(m^2).  Estimated Nutritional Needs:   Kcal:  1400-1600  Protein:  80-90g  Fluid:  1.6L/day  EDUCATION NEEDS:   No education needs identified at this time  Clayton Bibles, MS, RD, LDN Pager: [phone] After Hours Pager: [phone]

## 2015-04-15 NOTE — Progress Notes (Signed)
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  She was admitted on 9/25 for increased fever, cough and congestion coming from her skilled nursing facility and admitted for sepsis secondary to pneumonia causing acute respiratory failure.  Today, patient states she's feeling a little bit better. Breathing a little bit easier, no pain. Chest x-ray notes large pleural effusion.  Seen by oncology who are recommending thoracentesis  Assessment/Plan: Principal Problem:   Sepsis secondary to healthcare associated pneumonia causing patient meets criteria for sepsis on admission with fever, tachycardia, tachypnea and hypotension, noting a normal lactic acid level with pulmonary source. Acute respiratory failure with hypoxia: Secondary to healthcare associated pneumonia and/or pleural effusion possibly from malignancy. Continue broad-spectrum antibiotics, oxygen support and will request pulmonary for thoracentesis. Clinical improvement Active Problems:   MGUS (monoclonal gammopathy of unknown significance): Being followed by oncology    Lung nodule/adrenal nodule: Felt to be metastatic disease of unknown primary    Chronic diastolic heart failure: Looks to be euvolemic. Monitor daily weights closely.    Malnutrition of moderate degree: Patient meets criteria in the setting of chronic illness. Nutrition has been consulted.  CODE STATUS: Full code  Family communication: We'll discuss with daughter today    Disposition Plan: continue step down until after thoracentesis  Consultants:  Pulmonary  Oncology  Procedures:  Planned thoracentesis   Antibiotics: IV Fortaz 9/25-present IV vancomycin on/25-present   Objective: BP 167/55 mmHg  Pulse 103  Temp(Src) 99.6 F (37.6 C) (Oral)  Resp 32  Ht 5\' 8"  (1.727 m)  Wt 67.5 kg (148 lb 13 oz)  BMI 22.63 kg/m2  SpO2 93%  Intake/Output Summary (Last 24 hours) at 04/15/15 0958 Last data filed at 04/15/15 0600  Gross per 24 hour  Intake   3840 ml  Output      0 ml  Net   3840 ml   Filed Weights   04/01/2015 1814 04/15/15 0400  Weight: 66.6 kg (146 lb 13.2 oz) 67.5 kg (148 lb 13 oz)    Exam:   General:  Alert and oriented 3, quite fatigued   Cardiovascular: regular rate and rhythm, borderline tachycardia   Respiratory: Bilateral expiratory wheezing, scattered rales   Abdomen: Soft, nontender, nondistended, hypoactive bowel sounds   Musculoskeletal: No clubbing or cyanosis or edema    Data Reviewed: Basic Metabolic Panel:  Recent Labs Lab 04/10/15 0831 04/11/15 0506 04/17/2015 1340 03/22/2015 2115 04/15/15 0515  NA 137 138 132* 130* 132*  K 3.6 4.2 3.1* 3.2* 3.1*  CL 104 105 92* 96* 96*  CO2 27 26 27 26 26   GLUCOSE 89 94 76 85 79  BUN <5* <5* 11 9 9   CREATININE 0.72 0.73 0.89 0.70 0.67  CALCIUM 8.1* 8.2* 8.2* 7.5* 7.8*  MG  --   --   --  1.1*  --   PHOS  --   --   --  3.3  --    Liver Function Tests:  Recent Labs Lab 04/10/15 0831 04/11/15 0506 04/01/2015 1340 03/26/2015 2115 04/15/15 0515  AST  44* 47* 66* 63* 60*  ALT 20 17 24 21 20   ALKPHOS 79 72 146* 135* 131*  BILITOT 0.5 0.6 1.1 0.6 0.8  PROT 4.7* 4.4* 5.3* 4.8* 4.7*  ALBUMIN 2.3* 2.2* 2.5* 2.1* 2.1*   No results for input(s): LIPASE, AMYLASE in the last 168 hours. No results for input(s): AMMONIA in the last 168 hours. CBC:  Recent Labs Lab 04/11/15 1740 04/12/15 0504 04/08/2015 1340 04/11/2015 2115 04/15/15 0515  WBC 18.2* 17.2* 9.0 7.6 6.8   NEUTROABS  --   --  7.9* 6.4  --   HGB 12.4 13.0 12.2 11.4* 11.6*  HCT 36.4 39.1 37.5 34.6* 34.5*  MCV 93.3 93.3 93.5 95.8 95.0  PLT 605* 624* 440* 379 276   Cardiac Enzymes:   No results for input(s): CKTOTAL, CKMB, CKMBINDEX, TROPONINI in the last 168 hours. BNP (last 3 results)  Recent Labs  03/14/15 1905 03/31/15 0346  BNP 23.4 53.3    ProBNP (last 3 results) No results for input(s): PROBNP in the last 8760 hours.  CBG:  Recent Labs Lab 04/15/15 0738  GLUCAP 76    Recent Results (from the past 240 hour(s))  Urine culture     Status: None (Preliminary result)   Collection Time: 04/09/2015  2:00 PM  Result Value Ref Range Status   Specimen Description URINE, CATHETERIZED  Final   Special Requests NONE  Final   Culture   Final    NO GROWTH < 24 HOURS Performed at [facility]    Report Status PENDING  Incomplete  MRSA PCR Screening     Status: None   Collection Time: 04/13/2015  5:30 PM  Result Value Ref Range Status   MRSA by PCR NEGATIVE NEGATIVE Final    Comment:        The GeneXpert MRSA Assay (FDA approved for NASAL specimens only), is one component of a comprehensive MRSA colonization surveillance program. It is not intended to diagnose MRSA infection nor to guide or monitor treatment for MRSA infections.   Culture, sputum-assessment     Status: None   Collection Time: 04/15/15  1:17 AM  Result Value Ref Range Status   Specimen Description SPU  Final   Special Requests NONE  Final   Sputum evaluation   Final    THIS SPECIMEN IS ACCEPTABLE. RESPIRATORY CULTURE REPORT TO FOLLOW.   Report Status 04/15/2015 FINAL  Final     Studies: Dg Chest 2 View  04/07/2015   CLINICAL DATA:  80 year old female with shortness of breath and productive cough. Status post laparoscopic splenectomy on 04/05/2015. Fever since 04/12/2015 up to 103.1 degrees Fahrenheit. No associated chest pain. History breast cancer.  EXAM: CHEST  2 VIEW  COMPARISON:  Chest x-ray  03/30/2015.  FINDINGS: Small to moderate left pleural effusion. Opacity at the left base may reflect atelectasis and/or consolidation. Large nodule in the periphery of the right lower lobe again noted (better demonstrated on 03/15/2015 chest CT). No evidence of pulmonary edema. Cardiopericardial silhouette is mildly enlarged. Upper mediastinal contours are within normal limits. Atherosclerosis in the thoracic aorta. Status post left axillary nodal dissection.  IMPRESSION: 1. Interval development of a small to moderate left pleural effusion with associated atelectasis and/or consolidation in the left lower lobe. 2. Mild enlargement of the cardiopericardial silhouette could suggest cardiomegaly and/or underlying pericardial effusion. 3. Large pulmonary nodule in the periphery of the right lower lobe remain suspicious for a metastatic lesion in this patient with history of breast cancer. 4. Atherosclerosis.  Electronically Signed   By: Vinnie Langton M.D.   On: 03/30/2015 14:34    Scheduled Meds: . antiseptic oral rinse  7 mL Mouth Rinse BID  . cefTAZidime (FORTAZ)  IV  2 g Intravenous 3 times per day  . escitalopram  5 mg Oral q morning - 10a  . feeding supplement (ENSURE ENLIVE)  237 mL Oral BID BM  . ferumoxytol  510 mg Intravenous Once  . folic acid  2 mg Oral Daily  . ipratropium  0.5 mg Nebulization TID  . levalbuterol  0.63 mg Nebulization TID  . multivitamin with minerals  1 tablet Oral Daily  . omega-3 acid ethyl esters  1 g Oral BID  . pantoprazole  40 mg Oral QHS  . raloxifene  60 mg Oral q morning - 10a  . rosuvastatin  10 mg Oral q morning - 10a  . sodium chloride  3 mL Intravenous Q12H  . vancomycin  500 mg Intravenous Q12H    Continuous Infusions: . sodium chloride 75 mL/hr at 04/19/2015 1808     Time spent: 25 minutes  [facility] Pager [phone]. If 7PM-7AM, please contact night-coverage at [email or web address], password [facility] 04/15/2015, 9:58 AM  LOS: 1  day

## 2015-04-15 NOTE — Procedures (Signed)
Thoracentesis Procedure Note  Pre-operative Diagnosis: Bilateral Pleural Effusions   Post-operative Diagnosis: same  Indications:  Diagnostic evaluation of pleural fluid and therapeutic relief of dyspna  Procedure Details  Consent: Informed consent was obtained. Risks of the procedure were discussed including: infection, bleeding, pain, pneumothorax.  Under sterile conditions the patient was positioned. Betadine solution and sterile drapes were utilized.  1% plain lidocaine was used to anesthetize the 7th rib space. Fluid was obtained without any difficulties and minimal blood loss.  A dressing was applied to the wound and wound care instructions were provided.   Findings 700 ml of rust colored pleural fluid was obtained. A sample was sent to Pathology for cell counts, as well as for infection analysis.  Complications:  None; patient tolerated the procedure well.          Condition: stable  Plan Follow up CXR pending to r/o PTX / complications post procedure Assess pleural studies, ordered routine studies for pleural fluid analysis      Noe Gens, NP-C [facility] Pgr: [phone] or if no answer [phone] 04/15/2015, 4:55 PM

## 2015-04-15 NOTE — Progress Notes (Signed)
Spoke with daughter at bedside regarding transfer to [facility]. Daughter verbalized understanding of need for higher level of care/

## 2015-04-15 NOTE — Consult Note (Signed)
Name: [name] [name] MRN: [id number] DOB: 24-Jun-1935    ADMISSION DATE:  04/18/2015 CONSULTATION DATE:  04/15/15  REFERRING MD :  [place] Pink  CHIEF COMPLAINT:  Cough  BRIEF PATIENT DESCRIPTION: 80 y.o. F with remote hx breast CA s/p mastectomy (1981), with possible mets and recent hospitalization September 2016 where she had splenectomy.  She was admitted 9/25 with HCAP and bilateral pleural effusions, s/p thora 9/26 by PCCM.  Echo revealed increase in size of pericardial effusion with findings c/w tamponade.  Transferred to [facility] for evaluation by CVTS in AM 9/27 for pericardial window.  SIGNIFICANT EVENTS  9/25 - admit. 9/26 - echo revealed pericardial effusion with tamponade.  Transferred to [facility].  To be evaluated by CVTS in AM 9/27 for pericardial window.  STUDIES: CT chest 9/26 >>> new moderate b/l pleural effusions, compressive atelectasis in lower lobes.  Enlarging moderate pericardial effusion.  Enlarging RLL 2.3cm nodule compared to 1.8cm previously. Stable bilateral adrenal masses.  Echo 9/26 >>> EF 60-65%, no RWMA's. Compared to prior study on 04/02/15, there is severe circumferential pericardial effusion with signs of hemodynamic compromise c/w tamponade.   HISTORY OF PRESENT ILLNESS:  [name] [name] is a 80 y.o. F with PMH as outlined below including remote breast CA s/p mastectomy (1981).  She was admitted in August 2016 and was found to have possible mets to lung and adrenals.  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  MRI of brain was negative and CT biopsy of the adrenals was also negative.  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 She was discharged to SNF with recommendations to follow up with Dr. Marin Olp as an outpatient.  On 9/25 at the SNF, pt had fever, cough,  and congestion.  She was subsequently taken to [facility] ED where she was admitted for HCAP.  She was also found to have pleural effusions for which PCCM was consulted 9/26.  She had a thoracentesis where 749mL of rusty colored fluid was obtained and sent for analysis with routine labs and cytology.  Echo done 9/26 revealed increase in size of known pericardial effusion with evidence of hemodynamic compromise c/w tamponade.  She was subsequently transferred to [facility] and is to be evaluated in AM 9/27 by CVTS for pericardial window.  PAST MEDICAL HISTORY :   has a past medical history of Abnormal WBC count; Other and unspecified hyperlipidemia; Unspecified essential hypertension; OP (osteoporosis); Vitamin D deficiency; MGUS (monoclonal gammopathy of unknown significance) (03/16/2013); Back pain; Breast cancer (1981); and DM type 2 (diabetes mellitus, type 2).  has past surgical history that includes Mastectomy; Breast biopsy; Breast reconstruction; skin cancer removal; Wisdom tooth extraction; Tooth extraction; and laparoscopic splenectomy (N/A, 04/05/2015). Prior to Admission medications   Medication Sig Start Date End Date Taking? Authorizing Provider  acetaminophen (TYLENOL) 325 MG tablet Take 2 tablets (650 mg total) by mouth every 6 (six) hours as needed for mild pain, moderate pain, fever or headache (or Fever >/= 101). 04/12/15  Yes Modena Jansky, MD  escitalopram (LEXAPRO) 10 MG tablet Take 5 mg by mouth every morning.    Yes Historical Provider, MD  feeding supplement, ENSURE ENLIVE, (ENSURE ENLIVE) LIQD Take 237 mLs by mouth 2 (two) times daily between meals. 03/21/15  Yes Belkys A Regalado, MD  fish oil-omega-3 fatty acids 1000 MG capsule Take 2 g by mouth  every morning.    Yes Historical Provider, MD  folic acid (FOLVITE) 1 MG tablet Take 2 tablets (2 mg total) by mouth daily. 04/12/15  Yes Modena Jansky, MD  Multiple Vitamin (MULTIVITAMIN) capsule Take 1 capsule by mouth every morning.    Yes  Historical Provider, MD  ondansetron (ZOFRAN-ODT) 4 MG disintegrating tablet Take 1 tablet (4 mg total) by mouth every 8 (eight) hours as needed for nausea or vomiting. 04/12/15  Yes Modena Jansky, MD  pantoprazole (PROTONIX) 40 MG tablet Take 1 tablet (40 mg total) by mouth at bedtime. 04/10/15  Yes Reyne Dumas, MD  polyethylene glycol (MIRALAX / GLYCOLAX) packet Take 17 g by mouth daily. 04/10/15  Yes Reyne Dumas, MD  raloxifene (EVISTA) 60 MG tablet Take 60 mg by mouth every morning.    Yes Historical Provider, MD  rosuvastatin (CRESTOR) 20 MG tablet Take 10 mg by mouth every morning.    Yes Historical Provider, MD  simethicone (MYLICON) 80 MG chewable tablet Chew 0.5 tablets (40 mg total) by mouth every 6 (six) hours as needed for flatulence (bloating). 04/10/15  Yes Reyne Dumas, MD  Vitamin D, Ergocalciferol, (DRISDOL) 50000 UNITS CAPS Take 50,000 Units by mouth every Monday.    Yes Historical Provider, MD   No Known Allergies  FAMILY HISTORY:  family history includes Diabetes in her father; Liver cancer in her father. SOCIAL HISTORY:  reports that she quit smoking about 48 years ago. Her smoking use included Cigarettes. She started smoking about 58 years ago. She has a 10 pack-year smoking history. She has never used smokeless tobacco. She reports that she does not drink alcohol or use illicit drugs.  REVIEW OF SYSTEMS:   All negative; except for those that are bolded, which indicate positives.  Constitutional: weight loss, weight gain, night sweats, fevers, chills, fatigue, weakness.  HEENT: headaches, sore throat, sneezing, nasal congestion, post nasal drip, difficulty swallowing, tooth/dental problems, visual complaints, visual changes, ear aches. Neuro: difficulty with speech, weakness, numbness, ataxia. CV:  chest pain, orthopnea, PND, swelling in lower extremities, dizziness, palpitations, syncope.  Resp: cough, congestion, hemoptysis, dyspnea, wheezing. GI  heartburn,  indigestion, abdominal pain, nausea, vomiting, diarrhea, constipation, change in bowel habits, loss of appetite, hematemesis, melena, hematochezia.  GU: dysuria, change in color of urine, urgency or frequency, flank pain, hematuria. MSK: joint pain or swelling, decreased range of motion. Psych: change in mood or affect, depression, anxiety, suicidal ideations, homicidal ideations. Skin: rash, itching, bruising.    SUBJECTIVE:  States breathing is slightly better after thoracentesis.  Denies chest pain.  VITAL SIGNS: Temp:  [98.2 F (36.8 C)-102.9 F (39.4 C)] 98.2 F (36.8 C) (09/26 2200) Pulse Rate:  [92-115] 106 (09/26 2200) Resp:  [24-33] 31 (09/26 2200) BP: (115-176)/(39-93) 128/39 mmHg (09/26 2200) SpO2:  [93 %-100 %] 93 % (09/26 2200) Weight:  [67.4 kg (148 lb 9.4 oz)-67.5 kg (148 lb 13 oz)] 67.4 kg (148 lb 9.4 oz) (09/26 2200)  PHYSICAL EXAMINATION: General: Elderly female, chronically ill appearing, resting in bed, in NAD. Neuro: A&O x 3, non-focal.  HEENT: [place]/AT. PERRL, sclerae anicteric. Cardiovascular: RRR, no M/R/G.  Lungs: Respirations shallow but unlabored.  CTA bilaterally, No W/R/R. Abdomen: BS x 4, soft, NT/ND.  Musculoskeletal: No gross deformities, no edema.  Skin: Intact, warm, no rashes.     Recent Labs Lab 04/09/2015 1340 04/06/2015 2115 04/15/15 0515  NA 132* 130* 132*  K 3.1* 3.2* 3.1*  CL 92* 96* 96*  CO2 27 26 26  BUN 11 9 9   CREATININE 0.89 0.70 0.67  GLUCOSE 76 85 79    Recent Labs Lab 04/18/2015 1340 03/25/2015 2115 04/15/15 0515  HGB 12.2 11.4* 11.6*  HCT 37.5 34.6* 34.5*  WBC 9.0 7.6 6.8  PLT 440* 379 276   Dg Chest 1 View  04/15/2015   CLINICAL DATA:  Status post thoracentesis.  EXAM: CHEST 1 VIEW  COMPARISON:  03/30/2015 and chest CT dated 04/15/2015.  FINDINGS: Stable enlarged cardiac silhouette. No pleural fluid seen on the right at this time. Moderate-sized left pleural effusion and associated left basilar airspace opacity. The  interstitial markings remain prominent. No pneumothorax. The previously seen nodular density at the right lung base is no longer demonstrated. Diffuse osteopenia. Left axillary surgical clips.  IMPRESSION: 1. No pneumothorax. 2. No right pleural fluid seen. 3. Moderate-sized left pleural effusion and left basilar atelectasis or pneumonia. 4. Stable cardiomegaly and chronic interstitial lung disease.   Electronically Signed   By: Claudie Revering M.D.   On: 04/15/2015 17:13   Dg Chest 2 View  04/11/2015   CLINICAL DATA:  80 year old female with shortness of breath and productive cough. Status post laparoscopic splenectomy on 04/05/2015. Fever since 04/12/2015 up to 103.1 degrees Fahrenheit. No associated chest pain. History breast cancer.  EXAM: CHEST  2 VIEW  COMPARISON:  Chest x-ray 03/30/2015.  FINDINGS: Small to moderate left pleural effusion. Opacity at the left base may reflect atelectasis and/or consolidation. Large nodule in the periphery of the right lower lobe again noted (better demonstrated on 03/15/2015 chest CT). No evidence of pulmonary edema. Cardiopericardial silhouette is mildly enlarged. Upper mediastinal contours are within normal limits. Atherosclerosis in the thoracic aorta. Status post left axillary nodal dissection.  IMPRESSION: 1. Interval development of a small to moderate left pleural effusion with associated atelectasis and/or consolidation in the left lower lobe. 2. Mild enlargement of the cardiopericardial silhouette could suggest cardiomegaly and/or underlying pericardial effusion. 3. Large pulmonary nodule in the periphery of the right lower lobe remain suspicious for a metastatic lesion in this patient with history of breast cancer. 4. Atherosclerosis.   Electronically Signed   By: Vinnie Langton M.D.   On: 04/19/2015 14:34   Ct Chest W Contrast  04/15/2015   CLINICAL DATA:  Breast cancer. Cough, congestion. Sepsis secondary to pneumonia. Acute respiratory failure.  EXAM: CT CHEST  WITH CONTRAST  TECHNIQUE: Multidetector CT imaging of the chest was performed during intravenous contrast administration.  CONTRAST:  62mL OMNIPAQUE IOHEXOL 300 MG/ML  SOLN  COMPARISON:  Chest x-ray 04/08/2015.  Chest CT 03/14/2015.  FINDINGS: New bilateral pleural effusions since prior CT, moderate in size with compressive atelectasis in the lower lobes. Moderate pericardial effusion has increased in size since prior study. Heart is normal size. Small scattered mediastinal lymph nodes, none pathologically enlarged. Aorta is normal caliber.  Left breast implant noted. Surgical changes in the left breast and axilla. Chest wall soft tissues otherwise unremarkable.  Imaging into the upper abdomen shows no acute findings. Low-density lesions within the liver likely reflects cysts. Large bilateral adrenal lesions are again noted, stable.  Right basilar nodular opacity again noted, measuring 2.3 cm compared to 1.9 cm previously.  No acute bony abnormality or focal bone lesion.  IMPRESSION: New moderate bilateral pleural effusions since prior CT. Compressive atelectasis in the lower lobes.  Enlarging moderate pericardial effusion.  Enlarging right lower lobe nodule, 2.3 cm compared with 1.8 cm previously.  Stable bilateral adrenal masses.   Electronically Signed  By: Rolm Baptise M.D.   On: 04/15/2015 11:09    ASSESSMENT / PLAN:  Pericardial effusion - echo shows increase in size since prior study and now with hemodynamic compromise c/w tamponade. Hx dCHF, HLD, CAD. Plan: NPO after midnight. To be evaluated in AM 9/27 by CVTS for pericardial window.  HCAP. Acute hypoxic respiratory failure - due to above. Bilateral pleural effusions - s/p thoracentesis 9/26 R with 731mL of rusty colored fluid obtained. Concerning for malignant effusion. Right pulmonary nodule - concerning for mets. Plan: Continue empiric abx. Follow cultures. Continue supplemental O2 as needed to maintain SpO2 > 92%. BD's as  ordered. F/u on pleural fluid studies / cytology. Being followed by Dr. Marin Olp of oncology.  Hyponatremia. Hypokalemia - s/p 69mEq PO on admit. Pseodohypocalcemia - corrects to 9.32. Plan: BMP, ionized calcium now. Correct electrolytes as indicated. BMP in AM.  S/p splenectomy 04/04/15. Plan: Ensure pt receives pneumococcal and H.flu vaccines 04/19/15.  MGUS. Plan: Being followed by Dr. Marin Olp of oncology.   Montey Hora, [facility] Pager: [phone]  or [phone] 04/15/2015, 11:09 PM   Attending Note:  I have examined patient, reviewed labs, studies and notes. I have discussed the case with [place], and I agree with the data and plans as amended above. Pt hemodynamically stable post-transfer to [facility]. TCTS to eval this am, appreciate their assistance. Probable pericardial window. Await cytology on R pleural fluid.   Baltazar Apo, MD, PhD 04/09/2015, 8:59 AM [facility] [phone] or if no answer [phone]

## 2015-04-15 NOTE — Clinical Social Work Note (Signed)
Clinical Social Work Assessment  Patient Details  Name: [name] [name] MRN: [id number] Date of Birth: [birth year]  Date of referral:  04/15/15               Reason for consult:  Facility Placement, Discharge Planning                Permission sought to share information with:  Facility Art therapist granted to share information::  Yes, Verbal Permission Granted  Name::        Agency::     Relationship::     Contact Information:     Housing/Transportation Living arrangements for the past 2 months:  [place] of Information:  Patient, Facility Patient Interpreter Needed:  None Criminal Activity/Legal Involvement Pertinent to Current Situation/Hospitalization:  No - Comment as needed Significant Relationships:  Adult Children, Siblings Lives with:  Facility Resident, Self Do you feel safe going back to the place where you live?  Yes Need for family participation in patient care:  Yes (Comment)  Care giving concerns:  Pt hasn't reported any concerns at this time. No family at bedside.   Social Worker assessment / plan:  Pt hospitalized on 04/07/2015 with sepsis. Pt d/c from [place] on 04/12/15 to [place]. CSW met with pt to assist with d/c planning. Pt plans to return to [place] at d/c. CSW contacted SNF and clinicals sent for review.D/C  plan has been confirmed pending bed availability at time of d/c. CSW will maintain ongoing contact with SNF.  Insurance information:  Medicare PT Recommendations:  Not assessed at this time Information / Referral to community resources:  [place]  Patient/Family's Response to care:  No family at bedside. Pt would like to [facility] [place] [facility] to complete rehab at d/c.  Patient/Family's Understanding of and Emotional Response to Diagnosis, Current Treatment, and Prognosis:  Pt is feeling poorly. She is disappointed that she is back in the  hospital so soon. Pt hopes she will start feeling better soon.   Emotional Assessment Appearance:  Appears stated age Attitude/Demeanor/Rapport:  Other (cooperative) Affect (typically observed):  Appropriate Orientation:  Oriented to Self, Oriented to Place, Oriented to  Time, Oriented to Situation Alcohol / Substance use:  Not Applicable Psych involvement (Current and /or in the community):  No (Comment)  Discharge Needs  Concerns to be addressed:  Discharge Planning Concerns Readmission within the last 30 days:  Yes Current discharge risk:  None Barriers to Discharge:  No Barriers Identified   Luretha Rued, [place] 04/15/2015, 2:56 PM

## 2015-04-16 ENCOUNTER — Inpatient Hospital Stay (HOSPITAL_COMMUNITY): Payer: Medicare Other | Admitting: Certified Registered Nurse Anesthetist

## 2015-04-16 ENCOUNTER — Inpatient Hospital Stay (HOSPITAL_COMMUNITY): Payer: Medicare Other

## 2015-04-16 ENCOUNTER — Encounter (HOSPITAL_COMMUNITY): Admission: EM | Disposition: E | Payer: Self-pay | Source: Home / Self Care | Attending: Emergency Medicine

## 2015-04-16 ENCOUNTER — Encounter (HOSPITAL_COMMUNITY): Payer: Self-pay | Admitting: Certified Registered Nurse Anesthetist

## 2015-04-16 DIAGNOSIS — J90 Pleural effusion, not elsewhere classified: Secondary | ICD-10-CM | POA: Insufficient documentation

## 2015-04-16 DIAGNOSIS — I313 Pericardial effusion (noninflammatory): Secondary | ICD-10-CM

## 2015-04-16 DIAGNOSIS — I5032 Chronic diastolic (congestive) heart failure: Secondary | ICD-10-CM

## 2015-04-16 DIAGNOSIS — R918 Other nonspecific abnormal finding of lung field: Secondary | ICD-10-CM | POA: Insufficient documentation

## 2015-04-16 HISTORY — PX: CHEST TUBE INSERTION: SHX231

## 2015-04-16 HISTORY — PX: SUBXYPHOID PERICARDIAL WINDOW: SHX5075

## 2015-04-16 HISTORY — PX: TEE WITHOUT CARDIOVERSION: SHX5443

## 2015-04-16 LAB — FUNGAL STAIN: Fungal Smear: NONE SEEN

## 2015-04-16 LAB — URINE CULTURE: CULTURE: NO GROWTH

## 2015-04-16 LAB — GLUCOSE, CAPILLARY
GLUCOSE-CAPILLARY: 73 mg/dL (ref 65–99)
GLUCOSE-CAPILLARY: 70 mg/dL (ref 65–99)
Glucose-Capillary: 77 mg/dL (ref 65–99)
Glucose-Capillary: 57 mg/dL — ABNORMAL LOW (ref 65–99)
Glucose-Capillary: 114 mg/dL — ABNORMAL HIGH (ref 65–99)

## 2015-04-16 LAB — ABO/RH: ABO/RH(D): B POS

## 2015-04-16 LAB — CBC
HCT: 33.3 % — ABNORMAL LOW (ref 36.0–46.0)
HCT: 26.7 % — ABNORMAL LOW (ref 36.0–46.0)
HEMATOCRIT: 34.5 % — AB (ref 36.0–46.0)
HEMOGLOBIN: 11.6 g/dL — AB (ref 12.0–15.0)
Hemoglobin: 11.5 g/dL — ABNORMAL LOW (ref 12.0–15.0)
Hemoglobin: 9 g/dL — ABNORMAL LOW (ref 12.0–15.0)
MCH: 30.9 pg (ref 26.0–34.0)
MCH: 32.6 pg (ref 26.0–34.0)
MCH: 30.6 pg (ref 26.0–34.0)
MCHC: 33.6 g/dL (ref 30.0–36.0)
MCHC: 34.5 g/dL (ref 30.0–36.0)
MCHC: 33.7 g/dL (ref 30.0–36.0)
MCV: 91.8 fL (ref 78.0–100.0)
MCV: 94.3 fL (ref 78.0–100.0)
MCV: 90.8 fL (ref 78.0–100.0)
PLATELETS: 218 10*3/uL (ref 150–400)
Platelets: 284 10*3/uL (ref 150–400)
Platelets: 292 10*3/uL (ref 150–400)
RBC: 3.76 MIL/uL — AB (ref 3.87–5.11)
RBC: 3.53 MIL/uL — ABNORMAL LOW (ref 3.87–5.11)
RBC: 2.94 MIL/uL — AB (ref 3.87–5.11)
RDW: 14.4 % (ref 11.5–15.5)
RDW: 14.6 % (ref 11.5–15.5)
RDW: 14.2 % (ref 11.5–15.5)
WBC: 5.5 10*3/uL (ref 4.0–10.5)
WBC: 5.6 10*3/uL (ref 4.0–10.5)
WBC: 6.3 10*3/uL (ref 4.0–10.5)

## 2015-04-16 LAB — BLOOD GAS, ARTERIAL
ACID-BASE EXCESS: 0.7 mmol/L (ref 0.0–2.0)
Acid-Base Excess: 1.3 mmol/L (ref 0.0–2.0)
BICARBONATE: 24 meq/L (ref 20.0–24.0)
Bicarbonate: 25.2 mEq/L — ABNORMAL HIGH (ref 20.0–24.0)
Drawn by: 313941
FIO2: 0.6
LHR: 14 {breaths}/min
O2 CONTENT: 2 L/min
O2 SAT: 99.3 %
O2 Saturation: 94.1 %
PCO2 ART: 38.1 mmHg (ref 35.0–45.0)
PCO2 ART: 32.5 mmHg — AB (ref 35.0–45.0)
PEEP/CPAP: 5 cmH2O
PH ART: 7.434 (ref 7.350–7.450)
PH ART: 7.48 — AB (ref 7.350–7.450)
PO2 ART: 67.9 mmHg — AB (ref 80.0–100.0)
Patient temperature: 98
Patient temperature: 97.9
TCO2: 26.4 mmol/L (ref 0–100)
TCO2: 25 mmol/L (ref 0–100)
VT: 520 mL
pO2, Arterial: 145 mmHg — ABNORMAL HIGH (ref 80.0–100.0)

## 2015-04-16 LAB — BASIC METABOLIC PANEL
ANION GAP: 10 (ref 5–15)
ANION GAP: 9 (ref 5–15)
BUN: <5 mg/dL — ABNORMAL LOW (ref 6–20)
BUN: <5 mg/dL — ABNORMAL LOW (ref 6–20)
CALCIUM: 7.6 mg/dL — AB (ref 8.9–10.3)
CALCIUM: 8.1 mg/dL — AB (ref 8.9–10.3)
CO2: 27 mmol/L (ref 22–32)
CO2: 27 mmol/L (ref 22–32)
Chloride: 91 mmol/L — ABNORMAL LOW (ref 101–111)
Chloride: 90 mmol/L — ABNORMAL LOW (ref 101–111)
Creatinine, Ser: 0.9 mg/dL (ref 0.44–1.00)
Creatinine, Ser: 0.77 mg/dL (ref 0.44–1.00)
GFR calc Af Amer: >60 mL/min (ref >60)
GFR, EST NON AFRICAN AMERICAN: 60 mL/min — AB (ref >60)
GLUCOSE: 71 mg/dL (ref 65–99)
GLUCOSE: 77 mg/dL (ref 65–99)
POTASSIUM: 3.3 mmol/L — AB (ref 3.5–5.1)
Potassium: 2.8 mmol/L — ABNORMAL LOW (ref 3.5–5.1)
SODIUM: 127 mmol/L — AB (ref 135–145)
SODIUM: 127 mmol/L — AB (ref 135–145)

## 2015-04-16 LAB — PHOSPHORUS: PHOSPHORUS: 2.9 mg/dL (ref 2.5–4.6)

## 2015-04-16 LAB — GRAM STAIN

## 2015-04-16 LAB — MAGNESIUM: MAGNESIUM: 1 mg/dL — AB (ref 1.7–2.4)

## 2015-04-16 LAB — CANCER ANTIGEN 27.29: CA 27.29: 6.6 U/mL (ref 0.0–38.6)

## 2015-04-16 LAB — PROTIME-INR
INR: 1.56 — ABNORMAL HIGH (ref 0.00–1.49)
Prothrombin Time: 18.7 seconds — ABNORMAL HIGH (ref 11.6–15.2)

## 2015-04-16 LAB — PREPARE RBC (CROSSMATCH)

## 2015-04-16 LAB — APTT: aPTT: 35 seconds (ref 24–37)

## 2015-04-16 LAB — LEGIONELLA PNEUMOPHILA SEROGP 1 UR AG: L. pneumophila Serogp 1 Ur Ag: NEGATIVE

## 2015-04-16 LAB — CEA: CEA: 1.1 ng/mL (ref 0.0–4.7)

## 2015-04-16 SURGERY — CREATION, PERICARDIAL WINDOW, SUBXIPHOID APPROACH
Anesthesia: General

## 2015-04-16 MED ORDER — LACTATED RINGERS IV SOLN
INTRAVENOUS | Status: DC | PRN
  Administered 2015-04-16: 14:00:00 via INTRAVENOUS

## 2015-04-16 MED ORDER — ONDANSETRON HCL 4 MG/2ML IJ SOLN: 4.0000 mg | Freq: Four times a day (QID) | INTRAMUSCULAR | Status: DC | PRN

## 2015-04-16 MED ORDER — ADULT MULTIVITAMIN LIQUID CH
5.0000 mL | Freq: Every day | ORAL | Status: DC
  Administered 2015-04-17 – 2015-04-20 (×4): 5 mL
  Filled 2015-04-16 (×5): qty 5

## 2015-04-16 MED ORDER — FENTANYL CITRATE (PF) 100 MCG/2ML IJ SOLN
INTRAMUSCULAR | Status: AC
  Filled 2015-04-16: qty 2

## 2015-04-16 MED ORDER — GLYCOPYRROLATE 0.2 MG/ML IJ SOLN
INTRAMUSCULAR | Status: AC
  Filled 2015-04-16: qty 3

## 2015-04-16 MED ORDER — CHLORHEXIDINE GLUCONATE 0.12% ORAL RINSE (MEDLINE KIT): 15.0000 mL | Freq: Two times a day (BID) | OROMUCOSAL | Status: DC

## 2015-04-16 MED ORDER — PROPOFOL 10 MG/ML IV BOLUS
INTRAVENOUS | Status: AC
  Filled 2015-04-16: qty 20

## 2015-04-16 MED ORDER — CHLORHEXIDINE GLUCONATE 0.12% ORAL RINSE (MEDLINE KIT)
15.0000 mL | Freq: Two times a day (BID) | OROMUCOSAL | Status: DC
  Administered 2015-04-16 – 2015-04-17 (×2): 15 mL via OROMUCOSAL

## 2015-04-16 MED ORDER — METHYLPREDNISOLONE SODIUM SUCC 125 MG IJ SOLR
INTRAMUSCULAR | Status: AC
  Administered 2015-04-16: 80 mg via INTRAVENOUS
  Filled 2015-04-16: qty 2

## 2015-04-16 MED ORDER — STERILE WATER FOR IRRIGATION IR SOLN
Status: DC | PRN
  Administered 2015-04-16: 1000 mL

## 2015-04-16 MED ORDER — ETOMIDATE 2 MG/ML IV SOLN
INTRAVENOUS | Status: AC
  Filled 2015-04-16: qty 10

## 2015-04-16 MED ORDER — SUCCINYLCHOLINE CHLORIDE 20 MG/ML IJ SOLN
INTRAMUSCULAR | Status: DC | PRN
  Administered 2015-04-16 (×2): 100 mg via INTRAVENOUS

## 2015-04-16 MED ORDER — TRAMADOL HCL 50 MG PO TABS
50.0000 mg | ORAL_TABLET | Freq: Four times a day (QID) | ORAL | Status: DC | PRN
  Administered 2015-04-18 – 2015-04-20 (×3): 50 mg via ORAL
  Filled 2015-04-16 (×3): qty 1

## 2015-04-16 MED ORDER — EPHEDRINE SULFATE 50 MG/ML IJ SOLN
INTRAMUSCULAR | Status: DC | PRN
  Administered 2015-04-16: 5 mg via INTRAVENOUS
  Administered 2015-04-16: 10 mg via INTRAVENOUS

## 2015-04-16 MED ORDER — DEXTROSE 50 % IV SOLN
INTRAVENOUS | Status: AC
  Administered 2015-04-16: 50 mL via INTRAVENOUS
  Filled 2015-04-16: qty 50

## 2015-04-16 MED ORDER — DEXTROSE 50 % IV SOLN
50.0000 mL | Freq: Once | INTRAVENOUS | Status: AC
  Administered 2015-04-16: 50 mL via INTRAVENOUS

## 2015-04-16 MED ORDER — OMEGA-3-ACID ETHYL ESTERS 1 G PO CAPS
1.0000 g | ORAL_CAPSULE | Freq: Two times a day (BID) | ORAL | Status: DC
  Filled 2015-04-16 (×8): qty 1

## 2015-04-16 MED ORDER — SENNOSIDES 8.8 MG/5ML PO SYRP
5.0000 mL | ORAL_SOLUTION | Freq: Every day | ORAL | Status: DC
  Administered 2015-04-16: 5 mL
  Filled 2015-04-16 (×2): qty 5

## 2015-04-16 MED ORDER — ONDANSETRON HCL 4 MG/2ML IJ SOLN
INTRAMUSCULAR | Status: AC
  Filled 2015-04-16: qty 2

## 2015-04-16 MED ORDER — METHYLPREDNISOLONE SODIUM SUCC 125 MG IJ SOLR
80.0000 mg | Freq: Two times a day (BID) | INTRAMUSCULAR | Status: DC
  Administered 2015-04-16 – 2015-04-17 (×3): 80 mg via INTRAVENOUS
  Filled 2015-04-16 (×2): qty 2

## 2015-04-16 MED ORDER — FENTANYL CITRATE (PF) 100 MCG/2ML IJ SOLN
25.0000 ug | INTRAMUSCULAR | Status: DC | PRN
  Administered 2015-04-16: 25 ug via INTRAVENOUS
  Administered 2015-04-17 (×2): 50 ug via INTRAVENOUS
  Filled 2015-04-16 (×3): qty 2

## 2015-04-16 MED ORDER — 0.9 % SODIUM CHLORIDE (POUR BTL) OPTIME
TOPICAL | Status: DC | PRN
  Administered 2015-04-16: 2000 mL

## 2015-04-16 MED ORDER — LIDOCAINE HCL (CARDIAC) 20 MG/ML IV SOLN
INTRAVENOUS | Status: DC | PRN
  Administered 2015-04-16: 60 mg via INTRAVENOUS

## 2015-04-16 MED ORDER — ROCURONIUM BROMIDE 50 MG/5ML IV SOLN
INTRAVENOUS | Status: AC
  Filled 2015-04-16: qty 1

## 2015-04-16 MED ORDER — POTASSIUM CHLORIDE 10 MEQ/100ML IV SOLN
10.0000 meq | INTRAVENOUS | Status: AC
  Administered 2015-04-16 (×6): 10 meq via INTRAVENOUS
  Filled 2015-04-16 (×6): qty 100

## 2015-04-16 MED ORDER — HYDROMORPHONE HCL 1 MG/ML IJ SOLN
0.2500 mg | INTRAMUSCULAR | Status: DC | PRN
Start: 2015-04-16 — End: 2015-04-16

## 2015-04-16 MED ORDER — SENNOSIDES-DOCUSATE SODIUM 8.6-50 MG PO TABS: 1.0000 | ORAL_TABLET | Freq: Every day | ORAL | Status: DC

## 2015-04-16 MED ORDER — PHENYLEPHRINE HCL 10 MG/ML IJ SOLN
INTRAMUSCULAR | Status: DC | PRN
  Administered 2015-04-16 (×3): 80 ug via INTRAVENOUS

## 2015-04-16 MED ORDER — LACTATED RINGERS IV SOLN
INTRAVENOUS | Status: DC | PRN
  Administered 2015-04-16: 13:00:00 via INTRAVENOUS

## 2015-04-16 MED ORDER — ACETAMINOPHEN 500 MG PO TABS
1000.0000 mg | ORAL_TABLET | Freq: Four times a day (QID) | ORAL | Status: DC
  Administered 2015-04-17 – 2015-04-21 (×10): 1000 mg via ORAL
  Filled 2015-04-16 (×15): qty 2

## 2015-04-16 MED ORDER — SODIUM CHLORIDE 0.9 % IV BOLUS (SEPSIS)
1000.0000 mL | Freq: Once | INTRAVENOUS | Status: AC
  Administered 2015-04-16: 1000 mL via INTRAVENOUS

## 2015-04-16 MED ORDER — DEXTROSE 5 % IV SOLN
1.5000 g | INTRAVENOUS | Status: AC
  Administered 2015-04-16: 1.5 g via INTRAVENOUS
  Filled 2015-04-16: qty 1.5

## 2015-04-16 MED ORDER — FENTANYL CITRATE (PF) 250 MCG/5ML IJ SOLN
INTRAMUSCULAR | Status: AC
  Filled 2015-04-16: qty 5

## 2015-04-16 MED ORDER — ROCURONIUM BROMIDE 100 MG/10ML IV SOLN
INTRAVENOUS | Status: DC | PRN
  Administered 2015-04-16 (×2): 10 mg via INTRAVENOUS

## 2015-04-16 MED ORDER — ANTISEPTIC ORAL RINSE SOLUTION (CORINZ)
7.0000 mL | Freq: Four times a day (QID) | OROMUCOSAL | Status: DC
  Administered 2015-04-16 – 2015-04-17 (×4): 7 mL via OROMUCOSAL

## 2015-04-16 MED ORDER — LIDOCAINE HCL (CARDIAC) 20 MG/ML IV SOLN
INTRAVENOUS | Status: AC
  Filled 2015-04-16: qty 15

## 2015-04-16 MED ORDER — KCL IN DEXTROSE-NACL 20-5-0.9 MEQ/L-%-% IV SOLN
INTRAVENOUS | Status: DC
  Administered 2015-04-16 – 2015-04-17 (×2): via INTRAVENOUS
  Filled 2015-04-16 (×7): qty 1000

## 2015-04-16 MED ORDER — NOREPINEPHRINE BITARTRATE 1 MG/ML IV SOLN
2.0000 ug/min | INTRAVENOUS | Status: DC
  Administered 2015-04-16: 10 ug/min via INTRAVENOUS
  Filled 2015-04-16 (×2): qty 4

## 2015-04-16 MED ORDER — SODIUM CHLORIDE 0.9 % IJ SOLN: 10.0000 mL | INTRAMUSCULAR | Status: DC | PRN

## 2015-04-16 MED ORDER — GLYCOPYRROLATE 0.2 MG/ML IJ SOLN
INTRAMUSCULAR | Status: DC | PRN
  Administered 2015-04-16: 0.6 mg via INTRAVENOUS

## 2015-04-16 MED ORDER — FENTANYL CITRATE (PF) 100 MCG/2ML IJ SOLN
INTRAMUSCULAR | Status: DC | PRN
  Administered 2015-04-16: 50 ug via INTRAVENOUS
  Administered 2015-04-16: 100 ug via INTRAVENOUS

## 2015-04-16 MED ORDER — ETOMIDATE 2 MG/ML IV SOLN
INTRAVENOUS | Status: DC | PRN
  Administered 2015-04-16: 10 mg via INTRAVENOUS

## 2015-04-16 MED ORDER — NEOSTIGMINE METHYLSULFATE 10 MG/10ML IV SOLN
INTRAVENOUS | Status: DC | PRN
  Administered 2015-04-16: 4 mg via INTRAVENOUS

## 2015-04-16 MED ORDER — DEXMEDETOMIDINE HCL IN NACL 200 MCG/50ML IV SOLN
0.4000 ug/kg/h | INTRAVENOUS | Status: AC
  Administered 2015-04-16: 0.6 ug/kg/h via INTRAVENOUS
  Administered 2015-04-16: 0.4 ug/kg/h via INTRAVENOUS
  Administered 2015-04-16: 0.5 ug/kg/h via INTRAVENOUS
  Filled 2015-04-16: qty 50

## 2015-04-16 MED ORDER — ONDANSETRON HCL 4 MG/2ML IJ SOLN
INTRAMUSCULAR | Status: DC | PRN
  Administered 2015-04-16: 4 mg via INTRAVENOUS

## 2015-04-16 MED ORDER — ANTISEPTIC ORAL RINSE SOLUTION (CORINZ): 7.0000 mL | Freq: Four times a day (QID) | OROMUCOSAL | Status: DC

## 2015-04-16 MED ORDER — POTASSIUM CHLORIDE 10 MEQ/50ML IV SOLN: 10.0000 meq | Freq: Every day | INTRAVENOUS | Status: DC | PRN

## 2015-04-16 MED ORDER — DEXMEDETOMIDINE HCL IN NACL 200 MCG/50ML IV SOLN
0.4000 ug/kg/h | INTRAVENOUS | Status: DC
  Administered 2015-04-16: 0.4 ug/kg/h via INTRAVENOUS
  Filled 2015-04-16: qty 50

## 2015-04-16 MED ORDER — PROMETHAZINE HCL 25 MG/ML IJ SOLN
6.2500 mg | INTRAMUSCULAR | Status: DC | PRN
Start: 2015-04-16 — End: 2015-04-16

## 2015-04-16 MED ORDER — PHENYLEPHRINE HCL 10 MG/ML IJ SOLN
10.0000 mg | INTRAVENOUS | Status: DC | PRN
  Administered 2015-04-16: 20 ug/min via INTRAVENOUS

## 2015-04-16 MED ORDER — BISACODYL 5 MG PO TBEC
10.0000 mg | DELAYED_RELEASE_TABLET | Freq: Every day | ORAL | Status: DC
  Administered 2015-04-17 – 2015-04-18 (×2): 10 mg via ORAL
  Filled 2015-04-16 (×4): qty 2

## 2015-04-16 MED ORDER — ACETAMINOPHEN 160 MG/5ML PO SOLN
1000.0000 mg | Freq: Four times a day (QID) | ORAL | Status: DC
  Administered 2015-04-16 – 2015-04-17 (×2): 1000 mg via ORAL
  Filled 2015-04-16 (×3): qty 40.6

## 2015-04-16 MED ORDER — PANTOPRAZOLE SODIUM 40 MG PO PACK
40.0000 mg | PACK | Freq: Every day | ORAL | Status: DC
  Administered 2015-04-16: 40 mg
  Filled 2015-04-16 (×2): qty 20

## 2015-04-16 MED ORDER — INSULIN ASPART 100 UNIT/ML ~~LOC~~ SOLN
0.0000 [IU] | Freq: Four times a day (QID) | SUBCUTANEOUS | Status: DC
  Administered 2015-04-17 (×2): 2 [IU] via SUBCUTANEOUS
  Administered 2015-04-17: 8 [IU] via SUBCUTANEOUS
  Administered 2015-04-17: 12 [IU] via SUBCUTANEOUS
  Administered 2015-04-17 – 2015-04-18 (×2): 2 [IU] via SUBCUTANEOUS

## 2015-04-16 MED ORDER — SODIUM CHLORIDE 0.9 % IJ SOLN
10.0000 mL | Freq: Two times a day (BID) | INTRAMUSCULAR | Status: DC
  Administered 2015-04-16 – 2015-04-19 (×6): 10 mL
  Administered 2015-04-21: 20 mL
  Administered 2015-04-21 – 2015-04-29 (×12): 10 mL
  Administered 2015-04-30: 20 mL
  Administered 2015-05-01 – 2015-05-03 (×5): 10 mL

## 2015-04-16 SURGICAL SUPPLY — 58 items
BENZOIN TINCTURE PRP APPL 2/3 (GAUZE/BANDAGES/DRESSINGS) ×4 IMPLANT
BLADE SURG 11 STRL SS (BLADE) ×4 IMPLANT
CANISTER SUCT 3000ML PPV (MISCELLANEOUS) ×4 IMPLANT
CANISTER SUCTION 2500CC (MISCELLANEOUS) ×4 IMPLANT
CATH THORACIC 28FR (CATHETERS) ×4 IMPLANT
CATH THORACIC 28FR RT ANG (CATHETERS) ×4 IMPLANT
CATH THORACIC 36FR (CATHETERS) IMPLANT
CATH THORACIC 36FR RT ANG (CATHETERS) IMPLANT
CLOSURE WOUND 1/2 X4 (GAUZE/BANDAGES/DRESSINGS) ×1
CONT SPEC 4OZ CLIKSEAL STRL BL (MISCELLANEOUS) ×8 IMPLANT
COVER SURGICAL LIGHT HANDLE (MISCELLANEOUS) ×12 IMPLANT
COVER TABLE BACK 60X90 (DRAPES) ×4 IMPLANT
DERMABOND ADVANCED (GAUZE/BANDAGES/DRESSINGS) ×2
DERMABOND ADVANCED .7 DNX12 (GAUZE/BANDAGES/DRESSINGS) ×2 IMPLANT
DRAIN CHANNEL 28F RND 3/8 FF (WOUND CARE) ×4 IMPLANT
DRAPE CHEST BREAST 15X10 FENES (DRAPES) ×4 IMPLANT
DRAPE LAPAROSCOPIC ABDOMINAL (DRAPES) ×4 IMPLANT
DRAPE PROXIMA HALF (DRAPES) ×4 IMPLANT
ELECT BLADE 6.5 EXT (BLADE) ×4 IMPLANT
ELECT REM PT RETURN 9FT ADLT (ELECTROSURGICAL) ×4
ELECTRODE REM PT RTRN 9FT ADLT (ELECTROSURGICAL) ×2 IMPLANT
GAUZE SPONGE 4X4 12PLY STRL (GAUZE/BANDAGES/DRESSINGS) ×4 IMPLANT
GAUZE SPONGE 4X4 16PLY XRAY LF (GAUZE/BANDAGES/DRESSINGS) ×4 IMPLANT
GLOVE BIO SURGEON STRL SZ7.5 (GLOVE) ×12 IMPLANT
GOWN STRL REUS W/ TWL LRG LVL3 (GOWN DISPOSABLE) ×4 IMPLANT
GOWN STRL REUS W/TWL LRG LVL3 (GOWN DISPOSABLE) ×4
HEMOSTAT POWDER SURGIFOAM 1G (HEMOSTASIS) IMPLANT
KIT BASIN OR (CUSTOM PROCEDURE TRAY) ×8 IMPLANT
KIT ROOM TURNOVER OR (KITS) ×8 IMPLANT
NS IRRIG 1000ML POUR BTL (IV SOLUTION) ×4 IMPLANT
PACK CHEST (CUSTOM PROCEDURE TRAY) ×4 IMPLANT
PAD ARMBOARD 7.5X6 YLW CONV (MISCELLANEOUS) ×16 IMPLANT
PAD ELECT DEFIB RADIOL ZOLL (MISCELLANEOUS) ×4 IMPLANT
STRIP CLOSURE SKIN 1/2X4 (GAUZE/BANDAGES/DRESSINGS) ×3 IMPLANT
SUT SILK  1 MH (SUTURE)
SUT SILK 1 MH (SUTURE) IMPLANT
SUT SILK 2 0 SH CR/8 (SUTURE) ×4 IMPLANT
SUT VIC AB 1 CTX 18 (SUTURE) ×4 IMPLANT
SUT VIC AB 1 CTX 36 (SUTURE) ×2
SUT VIC AB 1 CTX36XBRD ANBCTR (SUTURE) ×2 IMPLANT
SUT VIC AB 3-0 X1 27 (SUTURE) ×4 IMPLANT
SWAB COLLECTION DEVICE MRSA (MISCELLANEOUS) IMPLANT
SYR 50ML SLIP (SYRINGE) IMPLANT
SYR CONTROL 10ML LL (SYRINGE) ×4 IMPLANT
SYRINGE 10CC LL (SYRINGE) IMPLANT
SYSTEM SAHARA CHEST DRAIN ATS (WOUND CARE) IMPLANT
SYSTEM SAHARA CHEST DRAIN RE-I (WOUND CARE) ×4 IMPLANT
TAPE CLOTH SURG 4X10 WHT LF (GAUZE/BANDAGES/DRESSINGS) ×4 IMPLANT
TOWEL OR 17X24 6PK STRL BLUE (TOWEL DISPOSABLE) ×4 IMPLANT
TOWEL OR 17X26 10 PK STRL BLUE (TOWEL DISPOSABLE) ×16 IMPLANT
TRAP SPECIMEN MUCOUS 40CC (MISCELLANEOUS) ×8 IMPLANT
TRAY CHEST TUBE INSERTION (SET/KITS/TRAYS/PACK) ×4 IMPLANT
TRAY FOLEY CATH 14FRSI W/METER (CATHETERS) ×4 IMPLANT
TRAY FOLEY IC TEMP SENS 16FR (CATHETERS) ×4 IMPLANT
TUBE ANAEROBIC SPECIMEN COL (MISCELLANEOUS) IMPLANT
TUBE CONNECTING 12'X1/4 (SUCTIONS) ×1
TUBE CONNECTING 12X1/4 (SUCTIONS) ×3 IMPLANT
WATER STERILE IRR 1000ML POUR (IV SOLUTION) ×8 IMPLANT

## 2015-04-16 NOTE — Progress Notes (Signed)
[name] [name] has been transferred over to [facility]. She is in the cardiac care unit. She was transferred over because of concerns regarding pericardial temp not.  She did undergo a right thoracentesis yesterday. She had 700 mL of fluid removed. We will await the cytology.  She had an echocardiogram done. This showed a large pericardial effusion. She was then transferred over to [facility].  She is awaiting a pericardial window procedure. This will allow [place] to get more tissue for diagnosis as regards to what is causing her issue.  Both her CA 27.29 and CEA are normal. As such, recurrent/metastatic breast cancer would be highly unlikely. It would still be unusual for lymphoma to present in this manner.  It is still not clear at all as to what is the underlying issue here. Maybe the fluid cytology will help [place] out.   She's been afebrile. Her blood pressure is okay. Pulse is 99. Overall, his been no change in her physical exam.  We will have to wait cytology from the pleural effusion. If she is going to have a pericardial window, then there should be tissue from the pericardium.  I thought that radiology might be able to get a biopsy of this right lung nodule since it is increasing in size. I would think that a biopsy could be done relatively easily as the nodule is peripheral in location.  As always,, I appreciate everybody's help and the great care that she is getting.  [name].  [place] Key 23:12

## 2015-04-16 NOTE — Consult Note (Signed)
[address]       [place], [address]             [phone]        Danelly Balazs [facility] Record #[id number] Date of Birth: Jul 16, 1935  Referring: No ref. provider found Primary Care: Jerlyn Ly, MD  Chief Complaint:    Chief Complaint  Patient presents with  . Fever    History of Present Illness:    The patient is a 80 year old female with a past medical history that includes MGUS, breast cancer, status post mastectomy in 1981 as well as other multiple comorbidities who was recently hospitalized for syncope and found to have possible metastatic disease of the lungs and adrenal glands. 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 MRI of the brain has not shown metastatic disease. CT biopsy of the adrenal was nondiagnostic. She underwent a splenectomy on 04/05/2015 and pathology showed minor B-cell population with A light chains and excess. She was followed by Dr. Burney Gauze from oncology. She was discharged to a skilled nursing facility. She re-presented with fevers, cough, congestion soon thereafter and readmitted for pneumonia by chest x-ray.. She also developed a left-sided pleural effusion which subsequently has undergone thoracentesis and has been found to have a pericardial effusion with findings consistent with tamponade on 2-D echocardiogram. We are asked to consult for consideration of pericardial window for drainage. Currently she is lethargic and falls asleep during questioning and examination so it is difficult to obtain significant information from her.    Current Activity/ Functional Status: Patient is not independent with mobility/ambulation, transfers, ADL's, IADL's.   Zubrod Score: At the time of surgery this patient's most appropriate activity status/level should be described as: []     0    Normal activity, no symptoms []     1    Restricted in  physical strenuous activity but ambulatory, able to do out light work [x]     2    Ambulatory and capable of self care, unable to do work activities, up and about                 more than 50%  Of the time                            []     3    Only limited self care, in bed greater than 50% of waking hours []     4    Completely disabled, no self care, confined to bed or chair []     5    Moribund  Past Medical History  Diagnosis Date  . Abnormal WBC count     low, h/o  . Other and unspecified hyperlipidemia   . Unspecified essential hypertension   . OP (osteoporosis)   . Vitamin D deficiency   . MGUS (monoclonal gammopathy of unknown significance) 03/16/2013  . Back pain   . Breast cancer 1981    mastectomy and lymph node removal  . DM type 2 (diabetes mellitus, type 2)     Pt states she is 'prediabetic'    Past Surgical History  Procedure Laterality Date  . Mastectomy      left  . Breast biopsy      right  . Breast reconstruction    . Skin cancer removal      basal cell skin  cancer  . Wisdom tooth extraction    . Tooth extraction    . Laparoscopic splenectomy N/A 04/05/2015    Procedure: LAPAROSCOPIC SPLENECTOMY;  Surgeon: Johnathan Hausen, MD;  Location: [facility];  Service: General;  Laterality: N/A;    History  Smoking status  . Former Smoker -- 1.00 packs/day for 10 years  . Types: Cigarettes  . Start date: 05/15/1956  . Quit date: 07/05/1966  Smokeless tobacco  . Never Used    Comment: quit 45 years ago    History  Alcohol Use No    Social History   Social History  . Marital Status: Widowed    Spouse Name: N/A  . Number of Children: 2  . Years of Education: N/A   Occupational History  . Not on file.   Social History Main Topics  . Smoking status: Former Smoker -- 1.00 packs/day for 10 years    Types: Cigarettes    Start date: 05/15/1956    Quit date: 07/05/1966  . Smokeless tobacco: Never Used     Comment: quit 45 years ago  . Alcohol Use: No  . Drug  Use: No  . Sexual Activity: No   Other Topics Concern  . Not on file   Social History Narrative    No Known Allergies  Current Facility-Administered Medications  Medication Dose Route Frequency Provider Last Rate Last Dose  . 0.9 %  sodium chloride infusion   Intravenous Continuous Elsie Stain, MD 10 mL/hr at 04/15/15 2230    . acetaminophen (TYLENOL) tablet 650 mg  650 mg Oral Q6H PRN Robbie Lis, MD   650 mg at 04/15/15 2026  . antiseptic oral rinse (CPC / CETYLPYRIDINIUM CHLORIDE 0.05%) solution 7 mL  7 mL Mouth Rinse BID Robbie Lis, MD   7 mL at 04/15/15 1000  . cefTAZidime (FORTAZ) 2 g in dextrose 5 % 50 mL IVPB  2 g Intravenous 3 times per day Garnet Sierras, RPH   2 g at 04/12/2015 0940  . escitalopram (LEXAPRO) tablet 5 mg  5 mg Oral q morning - 10a Robbie Lis, MD   5 mg at 04/15/15 1002  . feeding supplement (ENSURE ENLIVE) (ENSURE ENLIVE) liquid 237 mL  237 mL Oral BID BM Robbie Lis, MD   237 mL at 04/15/15 1000  . folic acid (FOLVITE) tablet 2 mg  2 mg Oral Daily Robbie Lis, MD   2 mg at 04/15/15 1002  . guaiFENesin-dextromethorphan (ROBITUSSIN DM) 100-10 MG/5ML syrup 5 mL  5 mL Oral Q4H PRN Dianne Dun, NP   5 mL at 04/15/15 2215  . ipratropium-albuterol (DUONEB) 0.5-2.5 (3) MG/3ML nebulizer solution 3 mL  3 mL Nebulization Q6H Rahul P Desai, PA-C   3 mL at 04/06/2015 0841  . levalbuterol (XOPENEX) nebulizer solution 0.63 mg  0.63 mg Nebulization Q3H PRN Rahul P Desai, PA-C      . multivitamin with minerals tablet 1 tablet  1 tablet Oral Daily Robbie Lis, MD   1 tablet at 04/15/15 1001  . omega-3 acid ethyl esters (LOVAZA) capsule 1 g  1 g Oral BID Robbie Lis, MD   1 g at 04/15/15 1001  . ondansetron (ZOFRAN) tablet 4 mg  4 mg Oral Q6H PRN Robbie Lis, MD       Or  . ondansetron [facility]) injection 4 mg  4 mg Intravenous Q6H PRN Robbie Lis, MD      . pantoprazole (PROTONIX) EC  tablet 40 mg  40 mg Oral QHS Robbie Lis, MD   40 mg at  03/25/2015 2110  . raloxifene (EVISTA) tablet 60 mg  60 mg Oral q morning - 10a Robbie Lis, MD   60 mg at 04/15/15 1002  . rosuvastatin (CRESTOR) tablet 10 mg  10 mg Oral q morning - 10a Robbie Lis, MD   10 mg at 04/15/15 1002  . simethicone (MYLICON) chewable tablet 40 mg  40 mg Oral Q6H PRN Robbie Lis, MD      . sodium chloride 0.9 % injection 3 mL  3 mL Intravenous Q12H Robbie Lis, MD   3 mL at 03/29/2015 2200  . vancomycin (VANCOCIN) 500 mg in sodium chloride 0.9 % 100 mL IVPB  500 mg Intravenous Q12H Audrea Muscat [place], RPH   500 mg at 04/10/2015 3570    Prescriptions prior to admission  Medication Sig Dispense Refill Last Dose  . acetaminophen (TYLENOL) 325 MG tablet Take 2 tablets (650 mg total) by mouth every 6 (six) hours as needed for mild pain, moderate pain, fever or headache (or Fever >/= 101).   04/11/2015 at Unknown time  . escitalopram (LEXAPRO) 10 MG tablet Take 5 mg by mouth every morning.    03/21/2015 at Unknown time  . feeding supplement, ENSURE ENLIVE, (ENSURE ENLIVE) LIQD Take 237 mLs by mouth 2 (two) times daily between meals. 237 mL 12 04/07/2015 at Unknown time  . fish oil-omega-3 fatty acids 1000 MG capsule Take 2 g by mouth every morning.    04/16/2015 at Unknown time  . folic acid (FOLVITE) 1 MG tablet Take 2 tablets (2 mg total) by mouth daily.   03/29/2015 at Unknown time  . Multiple Vitamin (MULTIVITAMIN) capsule Take 1 capsule by mouth every morning.    04/13/2015 at Unknown time  . ondansetron (ZOFRAN-ODT) 4 MG disintegrating tablet Take 1 tablet (4 mg total) by mouth every 8 (eight) hours as needed for nausea or vomiting.   unknown  . pantoprazole (PROTONIX) 40 MG tablet Take 1 tablet (40 mg total) by mouth at bedtime. 30 tablet 0 04/13/2015 at Unknown time  . polyethylene glycol (MIRALAX / GLYCOLAX) packet Take 17 g by mouth daily. 14 each 0 03/29/2015 at Unknown time  . raloxifene (EVISTA) 60 MG tablet Take 60 mg by mouth every morning.    04/13/2015 at Unknown time    . rosuvastatin (CRESTOR) 20 MG tablet Take 10 mg by mouth every morning.    03/25/2015 at Unknown time  . simethicone (MYLICON) 80 MG chewable tablet Chew 0.5 tablets (40 mg total) by mouth every 6 (six) hours as needed for flatulence (bloating). 30 tablet 0 Past Week at Unknown time  . Vitamin D, Ergocalciferol, (DRISDOL) 50000 UNITS CAPS Take 50,000 Units by mouth every Monday.    Past Week at Unknown time    Family History  Problem Relation Age of Onset  . Liver cancer Father   . Diabetes Father      Review of Systems:  Pertinent items are noted in HPI.  Unable to elicit much response as patient falls asleep frequently during questioning    Cardiac Review of Systems: Y or N  Chest Pain [    ]  Resting SOB [   ] Exertional SOB  [  ]  Orthopnea [  ]   Pedal Edema [   ]    Palpitations [  ] Syncope  [  ]   Presyncope [   ]  General Review of Systems: [Y] = yes [  ]=no Constitional: recent weight change [  ]; anorexia [  ]; fatigue [  ]; nausea [  ]; night sweats [  ]; fever [  ]; or chills [  ]                                                               Dental: poor dentition[  ]; Last Dentist visit:   Eye : blurred vision [  ]; diplopia [   ]; vision changes [  ];  Amaurosis fugax[  ]; Resp: cough [  ];  wheezing[  ];  hemoptysis[  ]; shortness of breath[  ]; paroxysmal nocturnal dyspnea[  ]; dyspnea on exertion[  ]; or orthopnea[  ];  GI:  gallstones[  ], vomiting[  ];  dysphagia[  ]; melena[  ];  hematochezia [  ]; heartburn[  ];   Hx of  Colonoscopy[  ]; GU: kidney stones [  ]; hematuria[  ];   dysuria [  ];  nocturia[  ];  history of     obstruction [  ]; urinary frequency [  ]             Skin: rash, swelling[  ];, hair loss[  ];  peripheral edema[  ];  or itching[  ]; Musculosketetal: myalgias[  ];  joint swelling[  ];  joint erythema[  ];  joint pain[  ];  back pain[  ];  Heme/Lymph: bruising[  ];  bleeding[  ];  anemia[  ];  Neuro: TIA[  ];  headaches[  ];  stroke[  ];   vertigo[  ];  seizures[  ];   paresthesias[  ];  difficulty walking[  ];  Psych:depression[  ]; anxiety[  ];  Endocrine: diabetes[  ];  thyroid dysfunction[  ];  Immunizations: Flu [  ]; Pneumococcal[  ];  Other:  Physical Exam: BP 151/45 mmHg  Pulse 103  Temp(Src) 98.5 F (36.9 C) (Oral)  Resp 33  Ht 5' 8.5" (1.74 m)  Wt 148 lb 9.4 oz (67.4 kg)  BMI 22.26 kg/m2  SpO2 99%   General appearance: delirious, fatigued and no distress Head: Normocephalic, without obvious abnormality, atraumatic Neck: no adenopathy, no carotid bruit, no JVD, supple, symmetrical, trachea midline and thyroid not enlarged, symmetric, no tenderness/mass/nodules Lymph nodes: Cervical, supraclavicular, and axillary nodes normal. Resp: coarse BS throughout Back: negative Cardio: regular rate and rhythm, S1, S2 normal, no rub and no murmur GI: soft, non-tender; bowel sounds normal; no masses,  no organomegaly and staples in place Extremities: extremities normal, atraumatic, no cyanosis or edema and DP pulses palpable Neurologic: Mental status: fatigued and lethargic  Diagnostic Studies & Laboratory data:     Recent Radiology Findings:   Dg Chest 1 View  04/15/2015   CLINICAL DATA:  Status post thoracentesis.  EXAM: CHEST 1 VIEW  COMPARISON:  04/10/2015 and chest CT dated 04/15/2015.  FINDINGS: Stable enlarged cardiac silhouette. No pleural fluid seen on the right at this time. Moderate-sized left pleural effusion and associated left basilar airspace opacity. The interstitial markings remain prominent. No pneumothorax. The previously seen nodular density at the right lung base is no longer demonstrated. Diffuse osteopenia. Left axillary surgical clips.  IMPRESSION: 1. No pneumothorax. 2.  No right pleural fluid seen. 3. Moderate-sized left pleural effusion and left basilar atelectasis or pneumonia. 4. Stable cardiomegaly and chronic interstitial lung disease.   Electronically Signed   By: Claudie Revering M.D.   On:  04/15/2015 17:13   Dg Chest 2 View  04/13/2015   CLINICAL DATA:  80 year old female with shortness of breath and productive cough. Status post laparoscopic splenectomy on 04/05/2015. Fever since 04/12/2015 up to 103.1 degrees Fahrenheit. No associated chest pain. History breast cancer.  EXAM: CHEST  2 VIEW  COMPARISON:  Chest x-ray 03/30/2015.  FINDINGS: Small to moderate left pleural effusion. Opacity at the left base may reflect atelectasis and/or consolidation. Large nodule in the periphery of the right lower lobe again noted (better demonstrated on 03/15/2015 chest CT). No evidence of pulmonary edema. Cardiopericardial silhouette is mildly enlarged. Upper mediastinal contours are within normal limits. Atherosclerosis in the thoracic aorta. Status post left axillary nodal dissection.  IMPRESSION: 1. Interval development of a small to moderate left pleural effusion with associated atelectasis and/or consolidation in the left lower lobe. 2. Mild enlargement of the cardiopericardial silhouette could suggest cardiomegaly and/or underlying pericardial effusion. 3. Large pulmonary nodule in the periphery of the right lower lobe remain suspicious for a metastatic lesion in this patient with history of breast cancer. 4. Atherosclerosis.   Electronically Signed   By: Vinnie Langton M.D.   On: 04/04/2015 14:34   Ct Chest W Contrast  04/15/2015   CLINICAL DATA:  Breast cancer. Cough, congestion. Sepsis secondary to pneumonia. Acute respiratory failure.  EXAM: CT CHEST WITH CONTRAST  TECHNIQUE: Multidetector CT imaging of the chest was performed during intravenous contrast administration.  CONTRAST:  77mL OMNIPAQUE IOHEXOL 300 MG/ML  SOLN  COMPARISON:  Chest x-ray 04/02/2015.  Chest CT 03/14/2015.  FINDINGS: New bilateral pleural effusions since prior CT, moderate in size with compressive atelectasis in the lower lobes. Moderate pericardial effusion has increased in size since prior study. Heart is normal size. Small  scattered mediastinal lymph nodes, none pathologically enlarged. Aorta is normal caliber.  Left breast implant noted. Surgical changes in the left breast and axilla. Chest wall soft tissues otherwise unremarkable.  Imaging into the upper abdomen shows no acute findings. Low-density lesions within the liver likely reflects cysts. Large bilateral adrenal lesions are again noted, stable.  Right basilar nodular opacity again noted, measuring 2.3 cm compared to 1.9 cm previously.  No acute bony abnormality or focal bone lesion.  IMPRESSION: New moderate bilateral pleural effusions since prior CT. Compressive atelectasis in the lower lobes.  Enlarging moderate pericardial effusion.  Enlarging right lower lobe nodule, 2.3 cm compared with 1.8 cm previously.  Stable bilateral adrenal masses.   Electronically Signed   By: Rolm Baptise M.D.   On: 04/15/2015 11:09     I have independently reviewed the above radiologic studies.  Recent Lab Findings: Lab Results  Component Value Date   WBC 6.8 04/15/2015   HGB 11.6* 04/15/2015   HCT 34.5* 04/15/2015   PLT 276 04/15/2015   GLUCOSE 77 03/30/2015   CHOL 106 04/15/2015   TRIG 163* 03/31/2015   HDL 10* 03/31/2015   LDLCALC 67 03/31/2015   ALT 20 04/15/2015   AST 60* 04/15/2015   NA 127* 04/07/2015   K 2.8* 04/06/2015   CL 91* 03/27/2015   CREATININE 0.90 04/10/2015   BUN <5* 04/08/2015   CO2 27 04/19/2015   TSH 2.841 04/04/2015   INR 1.44 04/14/2015   HGBA1C 5.7* 03/31/2015   ECHO -------------------------------------------------------------------  LV EF: 60% -  65%  ------------------------------------------------------------------- Study Conclusions  - Left ventricle: The cavity size was normal. Systolic function was normal. The estimated ejection fraction was in the range of 60% to 65%. Wall motion was normal; there were no regional wall motion abnormalities. - Aortic valve: There was no regurgitation. - Aortic root: The aortic root  was normal in size. - Mitral valve: There was trivial regurgitation. - Right ventricle: Systolic function was normal. - Right atrium: The atrium was normal in size. - Tricuspid valve: There was trivial regurgitation. - Pulmonary arteries: Systolic pressure was within the normal range.  Impressions:  - Compared to the prior study done on 04/02/2015 there is now severe circumferential pericardial effusion. There are signs of hemodynamic compromise consistent with tamponade.  We will transfer the patient to the [facility] for further management.   Assessment / Plan:  Pericardial effusion, tamponade- may benefit from window. Currently tolerating from hemodyn standpoint. + Hypertensive    She is currently afebrile and not leukocytic on yesterdays CBC  She is progressively hyponatremic(Na-127)  Mental status changes are probably not explained by pericardial effusion and are concerning  GOLD,WAYNE E, PA-C 03/29/2015 9:53 AM  Echocardiogram personally reviewed and patient discussed inn detail with Evonnie Pat , PA-C Agree with recommendation for pericardial window today on B Esco with mod-large effusion and hx of breast cancer and monoclonal gammopathy  patient examined and medical record reviewed,agree with above note. [name] 03/31/2015

## 2015-04-16 NOTE — Progress Notes (Signed)
Subjective:  Pt lethargic. Pericardial tamponade on 2D. TCTS to see for ? Window  Objective:  Temp:  [98.2 F (36.8 C)-102.9 F (39.4 C)] 98.5 F (36.9 C) (09/27 0800) Pulse Rate:  [98-115] 103 (09/27 0800) Resp:  [23-36] 33 (09/27 0800) BP: (115-161)/(28-93) 151/45 mmHg (09/27 0800) SpO2:  [93 %-100 %] 99 % (09/27 0842) Weight:  [148 lb 9.4 oz (67.4 kg)] 148 lb 9.4 oz (67.4 kg) (09/26 2200) Weight change: 1 lb 12.2 oz (0.8 kg)  Intake/Output from previous day: 09/26 0701 - 09/27 0700 In: 1550 [I.V.:1050; IV Piggyback:500] Out: -   Intake/Output from this shift: Total I/O In: 110 [I.V.:10; IV Piggyback:100] Out: -   Physical Exam: General appearance: alert and slowed mentation Neck: no adenopathy, no carotid bruit, no JVD, supple, symmetrical, trachea midline and thyroid not enlarged, symmetric, no tenderness/mass/nodules Lungs: Rhonchi at bases Heart: regular rate and rhythm, S1, S2 normal, no murmur, click, rub or gallop Extremities: extremities normal, atraumatic, no cyanosis or edema  Lab Results: Results for orders placed or performed during the hospital encounter of 04/04/2015 (from the past 48 hour(s))  Blood Culture (routine x 2)     Status: None (Preliminary result)   Collection Time: 04/17/2015  1:30 PM  Result Value Ref Range   Specimen Description BLOOD LEFT ANTECUBITAL    Special Requests BOTTLES DRAWN AEROBIC AND ANAEROBIC 10 CC EACH    Culture      NO GROWTH < 24 HOURS Performed at [facility]    Report Status PENDING   Comprehensive metabolic panel     Status: Abnormal   Collection Time: 04/17/2015  1:40 PM  Result Value Ref Range   Sodium 132 (L) 135 - 145 mmol/L   Potassium 3.1 (L) 3.5 - 5.1 mmol/L   Chloride 92 (L) 101 - 111 mmol/L   CO2 27 22 - 32 mmol/L   Glucose, Bld 76 65 - 99 mg/dL   BUN 11 6 - 20 mg/dL   Creatinine, Ser 0.89 0.44 - 1.00 mg/dL   Calcium 8.2 (L) 8.9 - 10.3 mg/dL   Total Protein 5.3 (L) 6.5 - 8.1 g/dL   Albumin  2.5 (L) 3.5 - 5.0 g/dL   AST 66 (H) 15 - 41 U/L   ALT 24 14 - 54 U/L   Alkaline Phosphatase 146 (H) 38 - 126 U/L   Total Bilirubin 1.1 0.3 - 1.2 mg/dL   GFR calc non Af Amer >60 >60 mL/min   GFR calc Af Amer >60 >60 mL/min    Comment: (NOTE) The eGFR has been calculated using the CKD EPI equation. This calculation has not been validated in all clinical situations. eGFR's persistently <60 mL/min signify possible Chronic Kidney Disease.    Anion gap 13 5 - 15  CBC WITH DIFFERENTIAL     Status: Abnormal   Collection Time: 03/22/2015  1:40 PM  Result Value Ref Range   WBC 9.0 4.0 - 10.5 K/uL   RBC 4.01 3.87 - 5.11 MIL/uL   Hemoglobin 12.2 12.0 - 15.0 g/dL   HCT 37.5 36.0 - 46.0 %   MCV 93.5 78.0 - 100.0 fL   MCH 30.4 26.0 - 34.0 pg   MCHC 32.5 30.0 - 36.0 g/dL   RDW 14.5 11.5 - 15.5 %   Platelets 440 (H) 150 - 400 K/uL   Neutrophils Relative % 88 %   Lymphocytes Relative 4 %   Monocytes Relative 7 %   Eosinophils Relative 1 %  Basophils Relative 0 %   Neutro Abs 7.9 (H) 1.7 - 7.7 K/uL   Lymphs Abs 0.4 (L) 0.7 - 4.0 K/uL   Monocytes Absolute 0.6 0.1 - 1.0 K/uL   Eosinophils Absolute 0.1 0.0 - 0.7 K/uL   Basophils Absolute 0.0 0.0 - 0.1 K/uL   Smear Review MORPHOLOGY UNREMARKABLE   Blood Culture (routine x 2)     Status: None (Preliminary result)   Collection Time: 03/22/2015  1:40 PM  Result Value Ref Range   Specimen Description BLOOD LEFT HAND    Special Requests BOTTLES DRAWN AEROBIC AND ANAEROBIC 5 CC EACH    Culture      NO GROWTH < 24 HOURS Performed at [facility]    Report Status PENDING   I-Stat CG4 Lactic Acid, ED  (not at  [facility])     Status: None   Collection Time: 03/25/2015  1:48 PM  Result Value Ref Range   Lactic Acid, Venous 1.26 0.5 - 2.0 mmol/L  Urinalysis, Routine w reflex microscopic (not at [facility])     Status: Abnormal   Collection Time: 03/27/2015  2:00 PM  Result Value Ref Range   Color, Urine YELLOW YELLOW   APPearance CLEAR CLEAR   Specific  Gravity, Urine 1.016 1.005 - 1.030   pH 5.5 5.0 - 8.0   Glucose, UA NEGATIVE NEGATIVE mg/dL   Hgb urine dipstick NEGATIVE NEGATIVE   Bilirubin Urine NEGATIVE NEGATIVE   Ketones, ur >80 (A) NEGATIVE mg/dL   Protein, ur NEGATIVE NEGATIVE mg/dL   Urobilinogen, UA 0.2 0.0 - 1.0 mg/dL   Nitrite NEGATIVE NEGATIVE   Leukocytes, UA NEGATIVE NEGATIVE    Comment: MICROSCOPIC NOT DONE ON URINES WITH NEGATIVE PROTEIN, BLOOD, LEUKOCYTES, NITRITE, OR GLUCOSE <1000 mg/dL.  Urine culture     Status: None   Collection Time: 04/09/2015  2:00 PM  Result Value Ref Range   Specimen Description URINE, CATHETERIZED    Special Requests NONE    Culture      NO GROWTH 1 DAY Performed at [facility]    Report Status 04/15/2015 FINAL   Procalcitonin     Status: None   Collection Time: 03/23/2015  4:41 PM  Result Value Ref Range   Procalcitonin 0.23 ng/mL    Comment:        Interpretation: PCT (Procalcitonin) <= 0.5 ng/mL: Systemic infection (sepsis) is not likely. Local bacterial infection is possible. (NOTE)         ICU PCT Algorithm               Non ICU PCT Algorithm    ----------------------------     ------------------------------         PCT < 0.25 ng/mL                 PCT < 0.1 ng/mL     Stopping of antibiotics            Stopping of antibiotics       strongly encouraged.               strongly encouraged.    ----------------------------     ------------------------------       PCT level decrease by               PCT < 0.25 ng/mL       >= 80% from peak PCT       OR PCT 0.25 - 0.5 ng/mL  Stopping of antibiotics                                             encouraged.     Stopping of antibiotics           encouraged.    ----------------------------     ------------------------------       PCT level decrease by              PCT >= 0.25 ng/mL       < 80% from peak PCT        AND PCT >= 0.5 ng/mL            Continuin g antibiotics                                               encouraged.       Continuing antibiotics            encouraged.    ----------------------------     ------------------------------     PCT level increase compared          PCT > 0.5 ng/mL         with peak PCT AND          PCT >= 0.5 ng/mL             Escalation of antibiotics                                          strongly encouraged.      Escalation of antibiotics        strongly encouraged.   MRSA PCR Screening     Status: None   Collection Time: 04/10/2015  5:30 PM  Result Value Ref Range   MRSA by PCR NEGATIVE NEGATIVE    Comment:        The GeneXpert MRSA Assay (FDA approved for NASAL specimens only), is one component of a comprehensive MRSA colonization surveillance program. It is not intended to diagnose MRSA infection nor to guide or monitor treatment for MRSA infections.   Strep pneumoniae urinary antigen     Status: None   Collection Time: 04/18/2015  5:40 PM  Result Value Ref Range   Strep Pneumo Urinary Antigen NEGATIVE NEGATIVE    Comment: PERFORMED AT [facility]        Infection due to S. pneumoniae cannot be absolutely ruled out since the antigen present may be below the detection limit of the test.   Urine culture     Status: None   Collection Time: 04/17/2015  6:00 PM  Result Value Ref Range   Specimen Description URINE, CLEAN CATCH    Special Requests NONE    Culture      NO GROWTH 1 DAY Performed at [facility]    Report Status 03/27/2015 FINAL   Lactic acid, plasma     Status: None   Collection Time: 04/12/2015  6:10 PM  Result Value Ref Range   Lactic Acid, Venous 1.2 0.5 - 2.0 mmol/L  Lactic acid, plasma     Status: None   Collection Time: 04/13/2015  7:42 PM  Result Value Ref Range   Lactic Acid, Venous 1.4 0.5 - 2.0 mmol/L  Comprehensive metabolic panel     Status: Abnormal   Collection Time: 04/13/2015  9:15 PM  Result Value Ref Range   Sodium 130 (L) 135 - 145 mmol/L   Potassium 3.2 (L) 3.5 - 5.1 mmol/L   Chloride 96 (L)  101 - 111 mmol/L   CO2 26 22 - 32 mmol/L   Glucose, Bld 85 65 - 99 mg/dL   BUN 9 6 - 20 mg/dL   Creatinine, Ser 0.70 0.44 - 1.00 mg/dL   Calcium 7.5 (L) 8.9 - 10.3 mg/dL   Total Protein 4.8 (L) 6.5 - 8.1 g/dL   Albumin 2.1 (L) 3.5 - 5.0 g/dL   AST 63 (H) 15 - 41 U/L   ALT 21 14 - 54 U/L   Alkaline Phosphatase 135 (H) 38 - 126 U/L   Total Bilirubin 0.6 0.3 - 1.2 mg/dL   GFR calc non Af Amer >60 >60 mL/min   GFR calc Af Amer >60 >60 mL/min    Comment: (NOTE) The eGFR has been calculated using the CKD EPI equation. This calculation has not been validated in all clinical situations. eGFR's persistently <60 mL/min signify possible Chronic Kidney Disease.    Anion gap 8 5 - 15  Phosphorus     Status: None   Collection Time: 04/17/2015  9:15 PM  Result Value Ref Range   Phosphorus 3.3 2.5 - 4.6 mg/dL  Magnesium     Status: Abnormal   Collection Time: 04/13/2015  9:15 PM  Result Value Ref Range   Magnesium 1.1 (L) 1.7 - 2.4 mg/dL  CBC WITH DIFFERENTIAL     Status: Abnormal   Collection Time: 04/04/2015  9:15 PM  Result Value Ref Range   WBC 7.6 4.0 - 10.5 K/uL   RBC 3.61 (L) 3.87 - 5.11 MIL/uL   Hemoglobin 11.4 (L) 12.0 - 15.0 g/dL   HCT 34.6 (L) 36.0 - 46.0 %   MCV 95.8 78.0 - 100.0 fL   MCH 31.6 26.0 - 34.0 pg   MCHC 32.9 30.0 - 36.0 g/dL   RDW 14.6 11.5 - 15.5 %   Platelets 379 150 - 400 K/uL   Neutrophils Relative % 84 %   Neutro Abs 6.4 1.7 - 7.7 K/uL   Lymphocytes Relative 7 %   Lymphs Abs 0.5 (L) 0.7 - 4.0 K/uL   Monocytes Relative 7 %   Monocytes Absolute 0.5 0.1 - 1.0 K/uL   Eosinophils Relative 1 %   Eosinophils Absolute 0.1 0.0 - 0.7 K/uL   Basophils Relative 1 %   Basophils Absolute 0.1 0.0 - 0.1 K/uL  APTT     Status: None   Collection Time: 03/24/2015  9:15 PM  Result Value Ref Range   aPTT 24 24 - 37 seconds  Protime-INR     Status: Abnormal   Collection Time: 04/19/2015  9:15 PM  Result Value Ref Range   Prothrombin Time 17.6 (H) 11.6 - 15.2 seconds   INR 1.44  0.00 - 1.49  TSH     Status: None   Collection Time: 03/27/2015  9:15 PM  Result Value Ref Range   TSH 2.841 0.350 - 4.500 uIU/mL  HIV antibody     Status: None   Collection Time: 04/13/2015 10:45 PM  Result Value Ref Range   HIV Screen 4th Generation wRfx Non Reactive Non Reactive    Comment: (NOTE) Performed At: [facility] [address]  [address] [place], [place] [id number] Lindon Romp MD [id number]   Culture, sputum-assessment     Status: None   Collection Time: 04/15/15  1:17 AM  Result Value Ref Range   Specimen Description SPU    Special Requests NONE    Sputum evaluation      THIS SPECIMEN IS ACCEPTABLE. RESPIRATORY CULTURE REPORT TO FOLLOW.   Report Status 04/15/2015 FINAL   Culture, respiratory (NON-Expectorated)     Status: None (Preliminary result)   Collection Time: 04/15/15  1:17 AM  Result Value Ref Range   Specimen Description SPUTUM    Special Requests NONE    Gram Stain PENDING    Culture      Culture reincubated for better growth Performed at [facility]    Report Status PENDING   Comprehensive metabolic panel     Status: Abnormal   Collection Time: 04/15/15  5:15 AM  Result Value Ref Range   Sodium 132 (L) 135 - 145 mmol/L   Potassium 3.1 (L) 3.5 - 5.1 mmol/L   Chloride 96 (L) 101 - 111 mmol/L   CO2 26 22 - 32 mmol/L   Glucose, Bld 79 65 - 99 mg/dL   BUN 9 6 - 20 mg/dL   Creatinine, Ser 0.67 0.44 - 1.00 mg/dL   Calcium 7.8 (L) 8.9 - 10.3 mg/dL   Total Protein 4.7 (L) 6.5 - 8.1 g/dL   Albumin 2.1 (L) 3.5 - 5.0 g/dL   AST 60 (H) 15 - 41 U/L   ALT 20 14 - 54 U/L   Alkaline Phosphatase 131 (H) 38 - 126 U/L   Total Bilirubin 0.8 0.3 - 1.2 mg/dL   GFR calc non Af Amer >60 >60 mL/min   GFR calc Af Amer >60 >60 mL/min    Comment: (NOTE) The eGFR has been calculated using the CKD EPI equation. This calculation has not been validated in all clinical situations. eGFR's persistently <60 mL/min signify possible Chronic Kidney Disease.     Anion gap 10 5 - 15  CBC     Status: Abnormal   Collection Time: 04/15/15  5:15 AM  Result Value Ref Range   WBC 6.8 4.0 - 10.5 K/uL   RBC 3.63 (L) 3.87 - 5.11 MIL/uL   Hemoglobin 11.6 (L) 12.0 - 15.0 g/dL   HCT 34.5 (L) 36.0 - 46.0 %   MCV 95.0 78.0 - 100.0 fL   MCH 32.0 26.0 - 34.0 pg   MCHC 33.6 30.0 - 36.0 g/dL   RDW 14.5 11.5 - 15.5 %   Platelets 276 150 - 400 K/uL  Glucose, capillary     Status: None   Collection Time: 04/15/15  7:38 AM  Result Value Ref Range   Glucose-Capillary 76 65 - 99 mg/dL   Comment 1 Notify RN    Comment 2 Document in Chart   Cancer antigen 27.29     Status: None   Collection Time: 04/15/15  2:24 PM  Result Value Ref Range   CA 27.29 6.6 0.0 - 38.6 U/mL    Comment: (NOTE) [employer]/ACS methodology Performed At: [facility] [place], [place] [id number] Lindon Romp MD AY:[id number]   CEA     Status: None   Collection Time: 04/15/15  2:24 PM  Result Value Ref Range   CEA 1.1 0.0 - 4.7 ng/mL    Comment: (NOTE)       Roche ECLIA methodology       Nonsmokers  <3.9  Smokers     <5.6 Performed At: [facility] [place], [place] [id number] Lindon Romp MD [id number]   Lactate dehydrogenase     Status: None   Collection Time: 04/15/15  2:24 PM  Result Value Ref Range   LDH 179 98 - 192 U/L  Protein, total     Status: Abnormal   Collection Time: 04/15/15  2:24 PM  Result Value Ref Range   Total Protein 4.1 (L) 6.5 - 8.1 g/dL  Cholesterol, total     Status: None   Collection Time: 04/15/15  2:24 PM  Result Value Ref Range   Cholesterol 106 0 - 200 mg/dL    Comment: Performed at [facility]  Lactate dehydrogenase (CSF, pleural or peritoneal fluid)     Status: Abnormal   Collection Time: 04/15/15  4:58 PM  Result Value Ref Range   LD, Fluid 67 (H) 3 - 23 U/L    Comment: (NOTE) Results should be evaluated in conjunction with serum  values Performed at [facility]    Fluid Type-FLDH PLEURAL     Comment: RIGHT CORRECTED ON 09/26 AT 1720: PREVIOUSLY REPORTED AS Pleural R   Body fluid cell count with differential     Status: Abnormal   Collection Time: 04/15/15  4:58 PM  Result Value Ref Range   Fluid Type-FCT PLEURAL     Comment: RIGHT CORRECTED ON 09/26 AT 1720: PREVIOUSLY REPORTED AS Pleural R    Color, Fluid RED (A) YELLOW   Appearance, Fluid TURBID (A) CLEAR   WBC, Fluid 89 0 - 1000 cu mm   Neutrophil Count, Fluid 42 (H) 0 - 25 %   Lymphs, Fluid 26 %   Monocyte-Macrophage-Serous Fluid 32 (L) 50 - 90 %   Other Cells, Fluid OTHER CELLS UNIDENTIFIED; SEE CYTOLOGY REPORT %  Culture, body fluid-bottle     Status: None (Preliminary result)   Collection Time: 04/15/15  4:58 PM  Result Value Ref Range   Specimen Description FLUID RIGHT PLEURAL    Special Requests NONE    Gram Stain      RARE WBC PRESENT, PREDOMINANTLY MONONUCLEAR NO ORGANISMS SEEN Performed at [facility]    Culture PENDING    Report Status PENDING   Basic metabolic panel     Status: Abnormal   Collection Time: 04/19/2015 12:32 AM  Result Value Ref Range   Sodium 127 (L) 135 - 145 mmol/L   Potassium 2.8 (L) 3.5 - 5.1 mmol/L   Chloride 91 (L) 101 - 111 mmol/L   CO2 27 22 - 32 mmol/L   Glucose, Bld 77 65 - 99 mg/dL   BUN <5 (L) 6 - 20 mg/dL   Creatinine, Ser 0.90 0.44 - 1.00 mg/dL   Calcium 7.6 (L) 8.9 - 10.3 mg/dL   GFR calc non Af Amer 60 (L) >60 mL/min   GFR calc Af Amer >60 >60 mL/min    Comment: (NOTE) The eGFR has been calculated using the CKD EPI equation. This calculation has not been validated in all clinical situations. eGFR's persistently <60 mL/min signify possible Chronic Kidney Disease.    Anion gap 9 5 - 15  Glucose, capillary     Status: None   Collection Time: 03/23/2015  8:13 AM  Result Value Ref Range   Glucose-Capillary 73 65 - 99 mg/dL   Comment 1 Capillary Specimen     Imaging: Imaging  results have been reviewed  Assessment/Plan:   1. Principal Problem: 2.  Pericardial tamponade 3. Active Problems: 4.   MGUS (monoclonal gammopathy of unknown significance) 5.   Lung nodule 6.   Adrenal nodule 7.   Chronic diastolic heart failure 8.   Malnutrition of moderate degree 9.   Generalized weakness 10.   HLD (hyperlipidemia) 11.   S/P laparoscopic splenectomy 12.   HCAP (healthcare-associated pneumonia) 23.   Sepsis due to pneumonia 14.   Acute respiratory failure with hypoxia 15.   Hypokalemia 16.   Pericardial effusion 17.   Pressure ulcer 18.   Lung nodules 19.   Pleural effusion on right 20.   Time Spent Directly with Patient:  20 minutes  Length of Stay:  LOS: 2 days   Pt seen by Dr Aundra Dubin yesterday who suggested pericardial window. Pt is NPO. Will get TCTS to see this AM. BP stable . Minor ST. JVP mildly elevated. Otherwise CV stable. S/P thoracentesis.   Quay Burow 03/21/2015, 9:21 AM

## 2015-04-16 NOTE — Transfer of Care (Signed)
Immediate Anesthesia Transfer of Care Note  Patient: [name] [name]  Procedure(s) Performed: Procedure(s): SUBXYPHOID PERICARDIAL WINDOW (N/A) CHEST TUBE INSERTION (Left) TRANSESOPHAGEAL ECHOCARDIOGRAM (TEE) (N/A)  Patient Location: PACU  Anesthesia Type:General  Level of Consciousness: lethargic and responds to stimulation  Airway & Oxygen Therapy: Patient Spontanous Breathing, Patient connected to face mask oxygen and Patient re-intubated  Post-op Assessment: Report given to RN and Post -op Vital signs reviewed and stable  Post vital signs: Reviewed and stable  Last Vitals:  Filed Vitals:   03/22/2015 1100  BP: 164/56  Pulse: 109  Temp:   Resp: 32    Complications: Patient re-intubated

## 2015-04-16 NOTE — Progress Notes (Signed)
eLink Physician-Brief Progress Note Patient Name: [name] [name] DOB: [birth year] MRN: [id number]   Date of Service  04/04/2015  HPI/Events of Note  BP = 98/52 (MAP = 70). Now on a Norepinephrine IV infusion. CVP = 8.  eICU Interventions  Will bolus with 0.9 NaCl 1 liter IV over 1 hour now. Wean Norepinephrine IV infusion as tolerated.      Intervention Category Intermediate Interventions: Hypotension - evaluation and management  Sommer,Steven Eugene 03/26/2015, 9:25 PM

## 2015-04-16 NOTE — Brief Op Note (Signed)
03/27/2015 - 04/03/2015  3:11 PM  PATIENT:  [name] [name]  80 y.o. female  PRE-OPERATIVE DIAGNOSIS:  1. Pericardial effusion with tamponade 2. Left Pleural effusion  POST-OPERATIVE DIAGNOSIS:  1. Pericardial effusion with tamponade 2. Left Pleural effusion  PROCEDURE:  TRANSESOPHAGEAL ECHOCARDIOGRAM (TEE), SUBXYPHOID PERICARDIAL WINDOW,  LEFT CHEST TUBE INSERTION   Findings: Approximately 500 cc of yellowish fluid removed from the pericardial space. Approximately 350 cc+ of yellowish fluid removed from the left pleural space.  SURGEON:  Surgeon(s) and Role:    * Ivin Poot, MD - Primary  PHYSICIAN ASSISTANT: Lars Pinks PA-C  ANESTHESIA:   general  EBL:  Total I/O In: 56 [I.V.:710; IV Piggyback:100] Out: 500 [Other:500]  BLOOD ADMINISTERED:none  DRAINS: 68 French right angle chest tube placed in the pericardial space;28 straight chest tube placed in the left pleural space   SPECIMEN:  Source of Specimen:  Pericardial fluid, pleural fluid, and pericardial biopsy  DISPOSITION OF SPECIMEN:  Culture, cytology  COUNTS CORRECT:  YES  DICTATION: .Dragon Dictation  PLAN OF CARE: Admit to inpatient   PATIENT DISPOSITION:  Extubated to ICU in stable condition   Delay start of Pharmacological VTE agent (>24hrs) due to surgical blood loss or risk of bleeding: yes

## 2015-04-16 NOTE — Progress Notes (Signed)
eLink Physician-Brief Progress Note Patient Name: [name] [name] DOB: 1935-07-20 MRN: [id number]   Date of Service  04/13/2015  HPI/Events of Note  k low PIV No taking orals pills  eICU Interventions  k IV     Intervention Category Intermediate Interventions: Electrolyte abnormality - evaluation and management  FEINSTEIN,DANIEL J. 03/29/2015, 1:41 AM

## 2015-04-16 NOTE — Anesthesia Preprocedure Evaluation (Addendum)
Anesthesia Evaluation  Patient identified by MRN, date of birth, ID band Patient awake    Reviewed: Allergy & Precautions, NPO status , Patient's Chart, lab work & pertinent test results  Airway Mallampati: II  TM Distance: >3 FB Neck ROM: Full    Dental   Pulmonary former smoker,    + rhonchi        Cardiovascular hypertension, Pt. on medications  Rhythm:Regular Rate:Normal  NL EF. Pericardial tamponade   Neuro/Psych negative neurological ROS     GI/Hepatic negative GI ROS, Neg liver ROS,   Endo/Other  diabetes  Renal/GU negative Renal ROS     Musculoskeletal   Abdominal   Peds  Hematology  (+) anemia ,   Anesthesia Other Findings   Reproductive/Obstetrics                            Anesthesia Physical Anesthesia Plan  ASA: IV  Anesthesia Plan: General   Post-op Pain Management:    Induction: Intravenous  Airway Management Planned: Oral ETT  Additional Equipment: Arterial line, TEE, Ultrasound Guidance Line Placement and CVP  Intra-op Plan:   Post-operative Plan: Extubation in OR and Possible Post-op intubation/ventilation  Informed Consent: I have reviewed the patients History and Physical, chart, labs and discussed the procedure including the risks, benefits and alternatives for the proposed anesthesia with the patient or authorized representative who has indicated his/her understanding and acceptance.   Dental advisory given  Plan Discussed with: CRNA  Anesthesia Plan Comments:        Anesthesia Quick Evaluation

## 2015-04-16 NOTE — Progress Notes (Addendum)
SLP Cancellation Note  Patient Details Name: [name] [name] MRN: [id number] DOB: 1934-09-23   Cancelled treatment:        NPO for procedure per RN. Will follow along for initiation for swallow assessment.   Houston Siren 04/04/2015, 8:09 AM  Orbie Pyo Colvin Caroli.Ed [employer] [phone]

## 2015-04-16 NOTE — Progress Notes (Signed)
Echocardiogram 2D Echocardiogram has been performed.  Tresa Res 04/17/2015, 3:23 PM

## 2015-04-16 NOTE — Progress Notes (Signed)
eLink Physician-Brief Progress Note Patient Name: [name] [name] DOB: [birth year] MRN: [id number]   Date of Service  04/09/2015  HPI/Events of Note  Fever to 103.5. Tylenol given.  eICU Interventions  Will order cooling blanket.      Intervention Category Intermediate Interventions: Other:  Lysle Dingwall 03/23/2015, 10:21 PM

## 2015-04-16 NOTE — Progress Notes (Signed)
eLink Physician-Brief Progress Note Patient Name: [name] [name] DOB: October 26, 1934 MRN: [id number]   Date of Service  03/28/2015  HPI/Events of Note  Persistent hypotension - BP = 69/45 post 1 liter 0.9 NaCl IV fluid. Precedex now decreased to 0.4.   eICU Interventions  Will order: 1. Norepinephrine IV infusion. Titrate to MAP >= 65. 2. CBC now.  3. Monitor CVP per protocol.     Intervention Category Major Interventions: Hypotension - evaluation and management  Sommer,Steven Eugene 04/11/2015, 7:09 PM

## 2015-04-16 NOTE — Progress Notes (Signed)
eLink Physician-Brief Progress Note Patient Name: [name] [name] DOB: April 20, 1935 MRN: [id number]   Date of Service  03/28/2015  HPI/Events of Note  Hypotension - BP = 66/33.   eICU Interventions  Will bolus with 0.9 NaCl 1 liter IV over 1 hour now.      Intervention Category Intermediate Interventions: Hypotension - evaluation and management  Sommer,Steven Eugene 04/13/2015, 5:59 PM

## 2015-04-16 NOTE — Anesthesia Procedure Notes (Addendum)
Procedure Name: Intubation Date/Time: 04/05/2015 2:10 PM Performed by: Garrison Columbus T Pre-anesthesia Checklist: Patient identified, Emergency Drugs available, Suction available and Patient being monitored Patient Re-evaluated:Patient Re-evaluated prior to inductionOxygen Delivery Method: Circle system utilized Preoxygenation: Pre-oxygenation with 100% oxygen Intubation Type: IV induction Ventilation: Mask ventilation without difficulty Laryngoscope Size: Miller and 2 Grade View: Grade I Tube type: Subglottic suction tube Tube size: 7.5 mm Number of attempts: 1 Airway Equipment and Method: Stylet Placement Confirmation: ETT inserted through vocal cords under direct vision,  positive ETCO2 and breath sounds checked- equal and bilateral Secured at: 21 cm Tube secured with: Tape Dental Injury: Teeth and Oropharynx as per pre-operative assessment    Anesthesia Procedure Note Central line insertion note. Skin prepped and draped in sterile fashion. Patent vessel identified on u/s using linear probe. Needle advanced under live u/s guidance with aspiration of blood upon entry into vessel. Catheter passed easily over finder needle. Wire passed easily through catheter and location confirmed with u/s. Image saved in chart. Double lumen central venous catheter advanced over wire, with aspiration of blood through all ports for confirmation. Line sutured and dressing applied. Pt tolerated well with no immediate complications.

## 2015-04-17 ENCOUNTER — Encounter: Payer: Self-pay | Admitting: Internal Medicine

## 2015-04-17 ENCOUNTER — Inpatient Hospital Stay (HOSPITAL_COMMUNITY): Payer: Medicare Other

## 2015-04-17 DIAGNOSIS — R911 Solitary pulmonary nodule: Secondary | ICD-10-CM

## 2015-04-17 DIAGNOSIS — Z978 Presence of other specified devices: Secondary | ICD-10-CM | POA: Insufficient documentation

## 2015-04-17 DIAGNOSIS — I319 Disease of pericardium, unspecified: Secondary | ICD-10-CM

## 2015-04-17 DIAGNOSIS — Z789 Other specified health status: Secondary | ICD-10-CM

## 2015-04-17 DIAGNOSIS — J90 Pleural effusion, not elsewhere classified: Secondary | ICD-10-CM | POA: Insufficient documentation

## 2015-04-17 DIAGNOSIS — Z9689 Presence of other specified functional implants: Secondary | ICD-10-CM | POA: Insufficient documentation

## 2015-04-17 LAB — BASIC METABOLIC PANEL
ANION GAP: 10 (ref 5–15)
BUN: <5 mg/dL — ABNORMAL LOW (ref 6–20)
CALCIUM: 7.2 mg/dL — AB (ref 8.9–10.3)
CO2: 21 mmol/L — ABNORMAL LOW (ref 22–32)
Chloride: 97 mmol/L — ABNORMAL LOW (ref 101–111)
Creatinine, Ser: 0.89 mg/dL (ref 0.44–1.00)
Glucose, Bld: 278 mg/dL — ABNORMAL HIGH (ref 65–99)
Potassium: 2.7 mmol/L — CL (ref 3.5–5.1)
SODIUM: 128 mmol/L — AB (ref 135–145)

## 2015-04-17 LAB — GLUCOSE, CAPILLARY
Glucose-Capillary: 123 mg/dL — ABNORMAL HIGH (ref 65–99)
Glucose-Capillary: 150 mg/dL — ABNORMAL HIGH (ref 65–99)
Glucose-Capillary: 152 mg/dL — ABNORMAL HIGH (ref 65–99)
Glucose-Capillary: 247 mg/dL — ABNORMAL HIGH (ref 65–99)
Glucose-Capillary: 251 mg/dL — ABNORMAL HIGH (ref 65–99)
Glucose-Capillary: 256 mg/dL — ABNORMAL HIGH (ref 65–99)

## 2015-04-17 LAB — CBC
HCT: 33.3 % — ABNORMAL LOW (ref 36.0–46.0)
Hemoglobin: 11.2 g/dL — ABNORMAL LOW (ref 12.0–15.0)
MCH: 29.9 pg (ref 26.0–34.0)
MCHC: 33.6 g/dL (ref 30.0–36.0)
MCV: 88.8 fL (ref 78.0–100.0)
PLATELETS: 219 10*3/uL (ref 150–400)
RBC: 3.75 MIL/uL — ABNORMAL LOW (ref 3.87–5.11)
RDW: 14.2 % (ref 11.5–15.5)
WBC: 10.2 10*3/uL (ref 4.0–10.5)

## 2015-04-17 LAB — URINE MICROSCOPIC-ADD ON

## 2015-04-17 LAB — POCT I-STAT 3, ART BLOOD GAS (G3+)
Acid-base deficit: 4 mmol/L — ABNORMAL HIGH (ref 0.0–2.0)
Bicarbonate: 20.1 mEq/L (ref 20.0–24.0)
O2 SAT: 99 %
PCO2 ART: 34.2 mmHg — AB (ref 35.0–45.0)
PO2 ART: 146 mmHg — AB (ref 80.0–100.0)
TCO2: 21 mmol/L (ref 0–100)
pH, Arterial: 7.376 (ref 7.350–7.450)

## 2015-04-17 LAB — URINALYSIS, ROUTINE W REFLEX MICROSCOPIC
BILIRUBIN URINE: NEGATIVE
GLUCOSE, UA: NEGATIVE mg/dL
Ketones, ur: NEGATIVE mg/dL
Leukocytes, UA: NEGATIVE
Nitrite: NEGATIVE
Protein, ur: NEGATIVE mg/dL
SPECIFIC GRAVITY, URINE: 1.006 (ref 1.005–1.030)
UROBILINOGEN UA: 0.2 mg/dL (ref 0.0–1.0)
pH: 6 (ref 5.0–8.0)

## 2015-04-17 LAB — CALCIUM, IONIZED: CALCIUM, IONIZED, SERUM: 4 mg/dL — AB (ref 4.5–5.6)

## 2015-04-17 LAB — GRAM STAIN

## 2015-04-17 LAB — CULTURE, RESPIRATORY W GRAM STAIN

## 2015-04-17 LAB — OSMOLALITY: OSMOLALITY: 279 mosm/kg (ref 275–300)

## 2015-04-17 LAB — VANCOMYCIN, TROUGH: Vancomycin Tr: 6 ug/mL — ABNORMAL LOW (ref 10.0–20.0)

## 2015-04-17 LAB — CULTURE, RESPIRATORY

## 2015-04-17 LAB — OSMOLALITY, URINE: OSMOLALITY UR: 113 mosm/kg — AB (ref 390–1090)

## 2015-04-17 MED ORDER — POTASSIUM CHLORIDE 10 MEQ/50ML IV SOLN
10.0000 meq | INTRAVENOUS | Status: AC
  Administered 2015-04-17 (×5): 10 meq via INTRAVENOUS
  Filled 2015-04-17 (×5): qty 50

## 2015-04-17 MED ORDER — PANTOPRAZOLE SODIUM 40 MG PO TBEC
40.0000 mg | DELAYED_RELEASE_TABLET | Freq: Every day | ORAL | Status: DC
  Administered 2015-04-17 – 2015-04-19 (×3): 40 mg via ORAL
  Filled 2015-04-17 (×4): qty 1

## 2015-04-17 MED ORDER — SENNA 8.6 MG PO TABS
1.0000 | ORAL_TABLET | Freq: Every day | ORAL | Status: DC
  Administered 2015-04-17 – 2015-04-18 (×2): 8.6 mg via ORAL
  Filled 2015-04-17 (×5): qty 1

## 2015-04-17 MED ORDER — VANCOMYCIN HCL 500 MG IV SOLR
500.0000 mg | Freq: Three times a day (TID) | INTRAVENOUS | Status: DC
  Administered 2015-04-17 – 2015-04-19 (×6): 500 mg via INTRAVENOUS
  Filled 2015-04-17 (×8): qty 500

## 2015-04-17 MED ORDER — VANCOMYCIN HCL 500 MG IV SOLR
500.0000 mg | Freq: Three times a day (TID) | INTRAVENOUS | Status: DC
  Filled 2015-04-17 (×2): qty 500

## 2015-04-17 MED ORDER — POTASSIUM CHLORIDE 20 MEQ/15ML (10%) PO SOLN: 40.0000 meq | ORAL | Status: DC

## 2015-04-17 MED ORDER — CETYLPYRIDINIUM CHLORIDE 0.05 % MT LIQD
7.0000 mL | Freq: Two times a day (BID) | OROMUCOSAL | Status: DC
  Administered 2015-04-17 – 2015-04-19 (×5): 7 mL via OROMUCOSAL

## 2015-04-17 MED ORDER — SODIUM CHLORIDE 0.9 % IV SOLN
INTRAVENOUS | Status: DC
  Administered 2015-04-19 – 2015-04-23 (×7): via INTRAVENOUS

## 2015-04-17 NOTE — Evaluation (Signed)
PT Cancellation Note  Patient Details Name: Samariah Hokenson MRN: [id number] DOB: 1935-03-23   Cancelled Treatment:    Reason Eval/Treat Not Completed: Medical issues which prohibited therapy;Patient not medically ready (Patient intubated overnight, will check back tomorrow.) RN agreed with checking back tomorrow.   Roanna Epley, SPT [phone]  04/17/2015, 11:36 AM  I have read, reviewed and agree with student's note.   [place] [phone] (pager)

## 2015-04-17 NOTE — Op Note (Signed)
NAME:  [name] [name], [name] [name] NO.:  [id number]  MEDICAL RECORD NO.:  [id number]  LOCATION:  2H09C                        FACILITY:  [facility]  PHYSICIAN:  Ivin Poot, M.D.  DATE OF BIRTH:  1935/03/31  DATE OF PROCEDURE:  03/27/2015 DATE OF DISCHARGE:                              OPERATIVE REPORT   OPERATION: 1. Subxiphoid pericardial window, with drainage of pericardial     effusion - 500 mL. 2. Placement of left chest tube.  SURGEON:  Ivin Poot, M.D.  ASSISTANT:  Lars Pinks, PA-C  PREOPERATIVE DIAGNOSES:  Large pericardial effusion and left pleural effusion, history of breast cancer, and monoclonal gammopathy.  POSTOPERATIVE DIAGNOSES:  Large pericardial effusion and left pleural effusion, history of breast cancer, and monoclonal gammopathy.  ANESTHESIA:  General.  PROCEDURE IN DETAIL:  The patient was brought directly from the CCU to the operating room for urgent drainage of a large pericardial effusion, recently diagnosed by echocardiogram within the past 24 hours.  The procedure was discussed in detail with the patient and informed consent was obtained.  We attempted to call the patient's daughter from the CCU and reached an answering voicemail and left a message.  The patient was placed supine on the operating table and general anesthesia was induced.  The transesophageal echo probe was placed as well by the Anesthesia team.  This confirmed the preoperative diagnosis of large pericardial effusion.  The patient's chest was then prepped and draped as a sterile field.  A proper time-out was performed.  Small incision was made centered on the xiphoid.  This was carried down to the subcutaneous tissue and the xiphoid was excised.  The sternal retractor - ruled tract - was used to elevate the distal end of the sternum.  The soft tissue anterior to the pericardium was gently dissected and an incision was made in the pericardium with a 15 blade.   Clear fluid under pressure immediately exited - total of 500 mL was drained.  A large pericardial window measuring 3 cm in diameter was created and the tissue was sent for Pathology and cultures.  The fluid in the pericardial space was sent for cytology and cultures.  A 28-French angled chest tube was placed dependently in the pericardial space and brought out through separate incision.  The retractor was removed and this incision was closed in layers using interrupted #1 Vicryl for the fascia and running 2-0 Vicryl for the subcutaneous and running subcuticular for the skin.  Sterile dressings were applied and the chest tube was connected to an underwater seal Pleur-Evac drainage system.  The plan was to try to extubate the patient and if not able, then return to the intensive care unit on the ventilator.     Ivin Poot, M.D.     PV/MEDQ  D:  04/08/2015  T:  04/17/2015  Job:  [id number]

## 2015-04-17 NOTE — Progress Notes (Signed)
Subjective:  Pt alert and responsive, intubated on weaning protocol. POD #1 Pericardial window and placement of a CT and pericardial drain.  Objective:  Temp:  [97.2 F (36.2 C)-103.5 F (39.7 C)] 97.8 F (36.6 C) (09/28 0700) Pulse Rate:  [40-113] 89 (09/28 0906) Resp:  [14-32] 15 (09/28 0906) BP: (69-165)/(21-110) 89/38 mmHg (09/28 0906) SpO2:  [78 %-100 %] 100 % (09/28 0906) Arterial Line BP: (67-172)/(32-102) 108/40 mmHg (09/28 0906) FiO2 (%):  [30 %-60 %] 30 % (09/28 0815) Weight:  [153 lb 3.5 oz (69.5 kg)] 153 lb 3.5 oz (69.5 kg) (09/28 0500) Weight change: 4 lb 10.1 oz (2.1 kg)  Intake/Output from previous day: 09/27 0701 - 09/28 0700 In: 4356.7 [I.V.:2106.7; IV Piggyback:2250] Out: 9741 [Urine:1000; Blood:10]  Intake/Output from this shift: Total I/O In: 155.7 [I.V.:105.7; IV Piggyback:50] Out: 6384 [Urine:125; Chest Tube:896]  Physical Exam: General appearance: alert and no distress Neck: no adenopathy, no carotid bruit, no JVD, supple, symmetrical, trachea midline and thyroid not enlarged, symmetric, no tenderness/mass/nodules Lungs: clear to auscultation bilaterally Heart: regular rate and rhythm, S1, S2 normal, no murmur, click, rub or gallop Extremities: 1+ edema bilat  Lab Results: Results for orders placed or performed during the hospital encounter of 04/02/2015 (from the past 48 hour(s))  Cancer antigen 27.29     Status: None   Collection Time: 04/15/15  2:24 PM  Result Value Ref Range   CA 27.29 6.6 0.0 - 38.6 U/mL    Comment: (NOTE) Bayer Centaur/ACS methodology Performed At: [facility] [place], [place] [id number] Lindon Romp MD [id number]   CEA     Status: None   Collection Time: 04/15/15  2:24 PM  Result Value Ref Range   CEA 1.1 0.0 - 4.7 ng/mL    Comment: (NOTE)       Roche ECLIA methodology       Nonsmokers  <3.9                                     Smokers     <5.6 Performed At: [facility] [address] [place], [place] [id number] Lindon Romp MD [id number]   Lactate dehydrogenase     Status: None   Collection Time: 04/15/15  2:24 PM  Result Value Ref Range   LDH 179 98 - 192 U/L  Protein, total     Status: Abnormal   Collection Time: 04/15/15  2:24 PM  Result Value Ref Range   Total Protein 4.1 (L) 6.5 - 8.1 g/dL  Cholesterol, total     Status: None   Collection Time: 04/15/15  2:24 PM  Result Value Ref Range   Cholesterol 106 0 - 200 mg/dL    Comment: Performed at [facility]  Lactate dehydrogenase (CSF, pleural or peritoneal fluid)     Status: Abnormal   Collection Time: 04/15/15  4:58 PM  Result Value Ref Range   LD, Fluid 67 (H) 3 - 23 U/L    Comment: (NOTE) Results should be evaluated in conjunction with serum values Performed at [facility]    Fluid Type-FLDH PLEURAL     Comment: RIGHT CORRECTED ON 09/26 AT 1720: PREVIOUSLY REPORTED AS Pleural R   Body fluid cell count with differential     Status: Abnormal   Collection Time: 04/15/15  4:58 PM  Result Value Ref Range   Fluid Type-FCT PLEURAL  Comment: RIGHT CORRECTED ON 09/26 AT 1720: PREVIOUSLY REPORTED AS Pleural R    Color, Fluid RED (A) YELLOW   Appearance, Fluid TURBID (A) CLEAR   WBC, Fluid 89 0 - 1000 cu mm   Neutrophil Count, Fluid 42 (H) 0 - 25 %   Lymphs, Fluid 26 %   Monocyte-Macrophage-Serous Fluid 32 (L) 50 - 90 %   Other Cells, Fluid OTHER CELLS UNIDENTIFIED; SEE CYTOLOGY REPORT %  Fungal stain     Status: None   Collection Time: 04/15/15  4:58 PM  Result Value Ref Range   Specimen Description PLEURAL RIGHT    Special Requests NONE    Fungal Smear      NO YEAST OR FUNGAL ELEMENTS SEEN Performed at [facility]    Report Status 04/19/2015 FINAL   Culture, body fluid-bottle     Status: None (Preliminary result)   Collection Time: 04/15/15  4:58 PM  Result Value Ref Range   Specimen Description FLUID RIGHT PLEURAL    Special Requests  NONE    Gram Stain      RARE WBC PRESENT, PREDOMINANTLY MONONUCLEAR NO ORGANISMS SEEN Performed at [facility]    Culture PENDING    Report Status PENDING   Basic metabolic panel     Status: Abnormal   Collection Time: 03/22/2015 12:32 AM  Result Value Ref Range   Sodium 127 (L) 135 - 145 mmol/L   Potassium 2.8 (L) 3.5 - 5.1 mmol/L   Chloride 91 (L) 101 - 111 mmol/L   CO2 27 22 - 32 mmol/L   Glucose, Bld 77 65 - 99 mg/dL   BUN <5 (L) 6 - 20 mg/dL   Creatinine, Ser 0.90 0.44 - 1.00 mg/dL   Calcium 7.6 (L) 8.9 - 10.3 mg/dL   GFR calc non Af Amer 60 (L) >60 mL/min   GFR calc Af Amer >60 >60 mL/min    Comment: (NOTE) The eGFR has been calculated using the CKD EPI equation. This calculation has not been validated in all clinical situations. eGFR's persistently <60 mL/min signify possible Chronic Kidney Disease.    Anion gap 9 5 - 15  Calcium, ionized     Status: Abnormal   Collection Time: 04/02/2015 12:32 AM  Result Value Ref Range   Calcium, Ionized, Serum 4.0 (L) 4.5 - 5.6 mg/dL    Comment: (NOTE) Performed At: [facility] [place], [place] [id number] Lindon Romp MD [id number]   Glucose, capillary     Status: None   Collection Time: 04/03/2015  8:13 AM  Result Value Ref Range   Glucose-Capillary 73 65 - 99 mg/dL   Comment 1 Capillary Specimen   Basic metabolic panel     Status: Abnormal   Collection Time: 04/01/2015 10:22 AM  Result Value Ref Range   Sodium 127 (L) 135 - 145 mmol/L   Potassium 3.3 (L) 3.5 - 5.1 mmol/L   Chloride 90 (L) 101 - 111 mmol/L   CO2 27 22 - 32 mmol/L   Glucose, Bld 71 65 - 99 mg/dL   BUN <5 (L) 6 - 20 mg/dL   Creatinine, Ser 0.77 0.44 - 1.00 mg/dL   Calcium 8.1 (L) 8.9 - 10.3 mg/dL   GFR calc non Af Amer >60 >60 mL/min   GFR calc Af Amer >60 >60 mL/min    Comment: (NOTE) The eGFR has been calculated using the CKD EPI equation. This calculation has not been validated in all clinical situations. eGFR's  persistently <  60 mL/min signify possible Chronic Kidney Disease.    Anion gap 10 5 - 15  CBC     Status: Abnormal   Collection Time: 03/22/2015 10:22 AM  Result Value Ref Range   WBC 5.5 4.0 - 10.5 K/uL   RBC 3.76 (L) 3.87 - 5.11 MIL/uL   Hemoglobin 11.6 (L) 12.0 - 15.0 g/dL   HCT 34.5 (L) 36.0 - 46.0 %   MCV 91.8 78.0 - 100.0 fL   MCH 30.9 26.0 - 34.0 pg   MCHC 33.6 30.0 - 36.0 g/dL   RDW 14.4 11.5 - 15.5 %   Platelets 284 150 - 400 K/uL  Magnesium     Status: Abnormal   Collection Time: 04/19/2015 10:22 AM  Result Value Ref Range   Magnesium 1.0 (L) 1.7 - 2.4 mg/dL  Phosphorus     Status: None   Collection Time: 03/31/2015 10:22 AM  Result Value Ref Range   Phosphorus 2.9 2.5 - 4.6 mg/dL  Blood gas, arterial     Status: Abnormal   Collection Time: 04/11/2015 11:05 AM  Result Value Ref Range   O2 Content 2.0 L/min   Delivery systems NASAL CANNULA    pH, Arterial 7.434 7.350 - 7.450   pCO2 arterial 38.1 35.0 - 45.0 mmHg   pO2, Arterial 67.9 (L) 80.0 - 100.0 mmHg   Bicarbonate 25.2 (H) 20.0 - 24.0 mEq/L   TCO2 26.4 0 - 100 mmol/L   Acid-Base Excess 1.3 0.0 - 2.0 mmol/L   O2 Saturation 94.1 %   Patient temperature 98.0    Collection site RIGHT RADIAL    Drawn by [phone]    Sample type ARTERIAL DRAW    Allens test (pass/fail) PASS PASS  CBC     Status: Abnormal   Collection Time: 04/12/2015 11:50 AM  Result Value Ref Range   WBC 5.6 4.0 - 10.5 K/uL    Comment: WHITE COUNT CONFIRMED ON SMEAR   RBC 3.53 (L) 3.87 - 5.11 MIL/uL   Hemoglobin 11.5 (L) 12.0 - 15.0 g/dL    Comment: REPEATED TO VERIFY   HCT 33.3 (L) 36.0 - 46.0 %   MCV 94.3 78.0 - 100.0 fL   MCH 32.6 26.0 - 34.0 pg   MCHC 34.5 30.0 - 36.0 g/dL   RDW 14.6 11.5 - 15.5 %   Platelets 292 150 - 400 K/uL    Comment: PLATELET COUNT CONFIRMED BY SMEAR LARGE PLATELETS PRESENT   Protime-INR     Status: Abnormal   Collection Time: 04/13/2015 11:50 AM  Result Value Ref Range   Prothrombin Time 18.7 (H) 11.6 - 15.2 seconds    INR 1.56 (H) 0.00 - 1.49  APTT     Status: None   Collection Time: 04/06/2015 11:50 AM  Result Value Ref Range   aPTT 35 24 - 37 seconds  Type and screen     Status: None (Preliminary result)   Collection Time: 03/30/2015 11:50 AM  Result Value Ref Range   ABO/RH(D) B POS    Antibody Screen NEG    Sample Expiration 04/19/2015    Unit Number [id number]    Blood Component Type RED CELLS,LR    Unit division 00    Status of Unit ALLOCATED    Transfusion Status OK TO TRANSFUSE    Crossmatch Result Compatible    Unit Number [id number]    Blood Component Type RED CELLS,LR    Unit division 00    Status of Unit ALLOCATED    Transfusion Status  OK TO TRANSFUSE    Crossmatch Result Compatible   Prepare RBC (crossmatch)     Status: None   Collection Time: 03/24/2015 11:50 AM  Result Value Ref Range   Order Confirmation ORDER PROCESSED BY BLOOD BANK   ABO/Rh     Status: None   Collection Time: 04/07/2015 11:50 AM  Result Value Ref Range   ABO/RH(D) B POS   Glucose, capillary     Status: None   Collection Time: 04/05/2015 11:57 AM  Result Value Ref Range   Glucose-Capillary 70 65 - 99 mg/dL   Comment 1 Capillary Specimen   Culture, body fluid-bottle     Status: None (Preliminary result)   Collection Time: 04/07/2015  2:49 PM  Result Value Ref Range   Specimen Description FLUID PERICARDIUM    Special Requests BOTTLES DRAWN AEROBIC ONLY 10CC PART B    Culture PENDING    Report Status PENDING   Gram stain     Status: None   Collection Time: 03/22/2015  2:49 PM  Result Value Ref Range   Specimen Description FLUID PERICARDIUM    Special Requests PART B    Gram Stain      CYTOSPIN SMEAR WBC PRESENT, PREDOMINANTLY MONONUCLEAR NO ORGANISMS SEEN    Report Status 04/13/2015 FINAL   Anaerobic culture     Status: None (Preliminary result)   Collection Time: 04/06/2015  2:50 PM  Result Value Ref Range   Specimen Description TISSUE PERICARDIUM    Special Requests PART C    Gram Stain      RARE  WBC PRESENT,BOTH PMN AND MONONUCLEAR NO ORGANISMS SEEN Performed at [facility]    Culture PENDING    Report Status PENDING   Tissue culture     Status: None (Preliminary result)   Collection Time: 04/15/2015  2:50 PM  Result Value Ref Range   Specimen Description TISSUE PERICARDIUM    Special Requests PART C    Gram Stain      RARE WBC PRESENT,BOTH PMN AND MONONUCLEAR NO ORGANISMS SEEN Performed at [facility]    Culture NO GROWTH Performed at [facility]     Report Status PENDING   Anaerobic culture     Status: None (Preliminary result)   Collection Time: 04/04/2015  2:56 PM  Result Value Ref Range   Specimen Description TISSUE PERICARDIUM    Special Requests PART D    Gram Stain      NO WBC SEEN NO ORGANISMS SEEN Performed at [facility]    Culture PENDING    Report Status PENDING   Tissue culture     Status: None (Preliminary result)   Collection Time: 04/15/2015  2:56 PM  Result Value Ref Range   Specimen Description TISSUE PERICARDIUM    Special Requests PART D    Gram Stain      NO WBC SEEN NO ORGANISMS SEEN Performed at [facility]    Culture NO GROWTH Performed at [facility]     Report Status PENDING   Culture, body fluid-bottle     Status: None (Preliminary result)   Collection Time: 03/31/2015  3:03 PM  Result Value Ref Range   Specimen Description FLUID LEFT PLEURAL    Special Requests PART F BAA 10CC    Culture PENDING    Report Status PENDING   Gram stain     Status: None   Collection Time: 03/24/2015  3:03 PM  Result Value Ref Range   Specimen  Description FLUID LEFT PLEURAL    Special Requests BOTTLES DRAWN AEROBIC AND ANAEROBIC 10CC PART F    Gram Stain      CYTOSPIN SMEAR WBC PRESENT,BOTH PMN AND MONONUCLEAR NO ORGANISMS SEEN    Report Status 04/17/2015 FINAL   Glucose, capillary     Status: None   Collection Time: 04/15/2015  3:48 PM  Result Value Ref Range   Glucose-Capillary 77 65 - 99  mg/dL   Comment 1 Notify RN    Comment 2 Document in Chart   Blood gas, arterial     Status: Abnormal   Collection Time: 04/04/2015  4:31 PM  Result Value Ref Range   FIO2 0.60    Delivery systems VENTILATOR    Mode PRESSURE REGULATED VOLUME CONTROL    VT 520 mL   LHR 14.0 resp/min   Peep/cpap 5.0 cm H20   pH, Arterial 7.480 (H) 7.350 - 7.450   pCO2 arterial 32.5 (L) 35.0 - 45.0 mmHg   pO2, Arterial 145 (H) 80.0 - 100.0 mmHg   Bicarbonate 24.0 20.0 - 24.0 mEq/L   TCO2 25.0 0 - 100 mmol/L   Acid-Base Excess 0.7 0.0 - 2.0 mmol/L   O2 Saturation 99.3 %   Patient temperature 97.9    Collection site A-LINE    Drawn by COLLECTED BY NURSE    Sample type ARTERIAL DRAW   Glucose, capillary     Status: Abnormal   Collection Time: 04/18/2015  7:04 PM  Result Value Ref Range   Glucose-Capillary 57 (L) 65 - 99 mg/dL  CBC     Status: Abnormal   Collection Time: 04/17/2015  7:15 PM  Result Value Ref Range   WBC 6.3 4.0 - 10.5 K/uL   RBC 2.94 (L) 3.87 - 5.11 MIL/uL   Hemoglobin 9.0 (L) 12.0 - 15.0 g/dL    Comment: REPEATED TO VERIFY POST TRANSFUSION SPECIMEN DELTA CHECK NOTED    HCT 26.7 (L) 36.0 - 46.0 %   MCV 90.8 78.0 - 100.0 fL   MCH 30.6 26.0 - 34.0 pg   MCHC 33.7 30.0 - 36.0 g/dL   RDW 14.2 11.5 - 15.5 %   Platelets 218 150 - 400 K/uL  Glucose, capillary     Status: Abnormal   Collection Time: 04/11/2015  7:55 PM  Result Value Ref Range   Glucose-Capillary 114 (H) 65 - 99 mg/dL  Glucose, capillary     Status: Abnormal   Collection Time: 04/17/15 12:01 AM  Result Value Ref Range   Glucose-Capillary 152 (H) 65 - 99 mg/dL   Comment 1 Capillary Specimen   I-STAT 3, arterial blood gas (G3+)     Status: Abnormal   Collection Time: 04/17/15  5:05 AM  Result Value Ref Range   pH, Arterial 7.376 7.350 - 7.450   pCO2 arterial 34.2 (L) 35.0 - 45.0 mmHg   pO2, Arterial 146.0 (H) 80.0 - 100.0 mmHg   Bicarbonate 20.1 20.0 - 24.0 mEq/L   TCO2 21 0 - 100 mmol/L   O2 Saturation 99.0 %    Acid-base deficit 4.0 (H) 0.0 - 2.0 mmol/L   Patient temperature HIDE    Collection site RADIAL, ALLEN'S TEST ACCEPTABLE    Drawn by RT    Sample type ARTERIAL   Glucose, capillary     Status: Abnormal   Collection Time: 04/17/15  5:07 AM  Result Value Ref Range   Glucose-Capillary 256 (H) 65 - 99 mg/dL   Comment 1 Arterial Specimen   CBC  Status: Abnormal   Collection Time: 04/17/15  5:10 AM  Result Value Ref Range   WBC 10.2 4.0 - 10.5 K/uL   RBC 3.75 (L) 3.87 - 5.11 MIL/uL   Hemoglobin 11.2 (L) 12.0 - 15.0 g/dL   HCT 33.3 (L) 36.0 - 46.0 %   MCV 88.8 78.0 - 100.0 fL   MCH 29.9 26.0 - 34.0 pg   MCHC 33.6 30.0 - 36.0 g/dL   RDW 14.2 11.5 - 15.5 %   Platelets 219 150 - 400 K/uL  Basic metabolic panel     Status: Abnormal   Collection Time: 04/17/15  5:10 AM  Result Value Ref Range   Sodium 128 (L) 135 - 145 mmol/L   Potassium 2.7 (LL) 3.5 - 5.1 mmol/L    Comment: DELTA CHECK NOTED CRITICAL RESULT CALLED TO, READ BACK BY AND VERIFIED WITH: SHORT L,RN 04/17/15 0629 WAYK    Chloride 97 (L) 101 - 111 mmol/L   CO2 21 (L) 22 - 32 mmol/L   Glucose, Bld 278 (H) 65 - 99 mg/dL   BUN <5 (L) 6 - 20 mg/dL   Creatinine, Ser 0.89 0.44 - 1.00 mg/dL   Calcium 7.2 (L) 8.9 - 10.3 mg/dL   GFR calc non Af Amer >60 >60 mL/min   GFR calc Af Amer >60 >60 mL/min    Comment: (NOTE) The eGFR has been calculated using the CKD EPI equation. This calculation has not been validated in all clinical situations. eGFR's persistently <60 mL/min signify possible Chronic Kidney Disease.    Anion gap 10 5 - 15  Glucose, capillary     Status: Abnormal   Collection Time: 04/17/15  5:14 AM  Result Value Ref Range   Glucose-Capillary 251 (H) 65 - 99 mg/dL   Comment 1 Capillary Specimen     Imaging: Imaging results have been reviewed  Assessment/Plan:   1. Principal Problem: 2.   Pericardial tamponade 3. Active Problems: 4.   MGUS (monoclonal gammopathy of unknown significance) 5.   Lung  nodule 6.   Adrenal nodule 7.   Chronic diastolic heart failure 8.   Malnutrition of moderate degree 9.   Generalized weakness 10.   HLD (hyperlipidemia) 11.   S/P laparoscopic splenectomy 12.   HCAP (healthcare-associated pneumonia) 64.   Sepsis due to pneumonia 14.   Acute respiratory failure with hypoxia 15.   Hypokalemia 16.   Pericardial effusion 17.   Pressure ulcer 18.   Lung nodules 19.   Pleural effusion on right 20.   Time Spent Directly with Patient:  20 minutes  Length of Stay:  LOS: 3 days   Looks great POD #1 Pericardial window for tamponade. 500 cc drained. Cytol pending. Also has CT Left pleural space. Tele NSR. BP soft. Off levophed now. No other CV issues. Drain removal per TCTS.   Quay Burow 04/17/2015, 9:13 AM

## 2015-04-17 NOTE — Progress Notes (Signed)
Name: [name] [name] MRN: [id number] DOB: 1935/07/08    ADMISSION DATE:  04/10/2015 CONSULTATION DATE:  04/15/15  REFERRING MD :  [place] Pink  CHIEF COMPLAINT:  Cough  BRIEF PATIENT DESCRIPTION: 80 y.o. F with remote hx breast CA s/p mastectomy (1981), with possible mets and recent hospitalization September 2016 where she had splenectomy.  She was admitted 9/25 with HCAP and bilateral pleural effusions, s/p thora 9/26 by PCCM.  Echo revealed increase in size of pericardial effusion with findings c/w tamponade.  Transferred to [facility] for evaluation by CVTS in AM 9/27 for pericardial window.  SIGNIFICANT EVENTS  9/25 - admit. 9/26 - echo revealed pericardial effusion with tamponade.  Transferred to [facility].  To be evaluated by CVTS in AM 9/27 for pericardial window. 9/27 - to OR for pericardial window (Dr Darcey Nora)  STUDIES: CT chest 9/26 >>> new moderate b/l pleural effusions, compressive atelectasis in lower lobes.  Enlarging moderate pericardial effusion.  Enlarging RLL 2.3cm nodule compared to 1.8cm previously. Stable bilateral adrenal masses.  Echo 9/26 >>> EF 60-65%, no RWMA's. Compared to prior study on 04/02/15, there is severe circumferential pericardial effusion with signs of hemodynamic compromise c/w tamponade.   HISTORY OF PRESENT ILLNESS:  [name] [name] is a 80 y.o. F with PMH as outlined below including remote breast CA s/p mastectomy (1981).  She was admitted in August 2016 and was found to have possible mets to lung and adrenals.  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  MRI of brain was negative and CT biopsy of the adrenals was also negative.  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 She was discharged to SNF with recommendations to follow up with Dr. Marin Olp as an  outpatient.  On 9/25 at the SNF, pt had fever, cough, and congestion.  She was subsequently taken to [facility] ED where she was admitted for HCAP.  She was also found to have pleural effusions for which PCCM was consulted 9/26.  She had a thoracentesis where 731mL of rusty colored fluid was obtained and sent for analysis with routine labs and cytology.  Echo done 9/26 revealed increase in size of known pericardial effusion with evidence of hemodynamic compromise c/w tamponade.  She was subsequently transferred to [facility] and is to be evaluated in AM 9/27 by CVTS for pericardial window.  SUBJECTIVE:   Was reintubated post-op, currently tolerating PSV, awake  VITAL SIGNS: Temp:  [97.2 F (36.2 C)-103.5 F (39.7 C)] 97.8 F (36.6 C) (09/28 0700) Pulse Rate:  [40-113] 83 (09/28 1000) Resp:  [14-27] 16 (09/28 1000) BP: (69-165)/(21-110) 86/38 mmHg (09/28 1000) SpO2:  [78 %-100 %] 99 % (09/28 1000) Arterial Line BP: (67-172)/(32-102) 103/39 mmHg (09/28 1000) FiO2 (%):  [30 %-60 %] 30 % (09/28 0815) Weight:  [69.5 kg (153 lb 3.5 oz)] 69.5 kg (153 lb 3.5 oz) (09/28 0500)  PHYSICAL EXAMINATION: General: Elderly female, chronically ill appearing, ventilated Neuro: A&O x 3, non-focal. Follows commands HEENT: [place]/AT. PERRL, sclerae anicteric. Cardiovascular: RRR, no M/R/G.  Lungs: no wheezes, no crackles Abdomen: BS x 4, soft, NT/ND.  Musculoskeletal: No gross deformities, no edema.  Skin: Intact, warm, no rashes.     Recent Labs Lab 04/06/2015 0032 04/05/2015 1022 04/17/15 0510  NA 127* 127* 128*  K 2.8* 3.3* 2.7*  CL 91* 90* 97*  CO2 27 27 21*  BUN <  5* <5* <5*  CREATININE 0.90 0.77 0.89  GLUCOSE 77 71 278*    Recent Labs Lab 04/19/2015 1150 04/01/2015 1915 04/17/15 0510  HGB 11.5* 9.0* 11.2*  HCT 33.3* 26.7* 33.3*  WBC 5.6 6.3 10.2  PLT 292 218 219   Dg Chest 1 View  04/15/2015   CLINICAL DATA:  Status post thoracentesis.  EXAM: CHEST 1 VIEW  COMPARISON:  03/24/2015 and chest CT dated  04/15/2015.  FINDINGS: Stable enlarged cardiac silhouette. No pleural fluid seen on the right at this time. Moderate-sized left pleural effusion and associated left basilar airspace opacity. The interstitial markings remain prominent. No pneumothorax. The previously seen nodular density at the right lung base is no longer demonstrated. Diffuse osteopenia. Left axillary surgical clips.  IMPRESSION: 1. No pneumothorax. 2. No right pleural fluid seen. 3. Moderate-sized left pleural effusion and left basilar atelectasis or pneumonia. 4. Stable cardiomegaly and chronic interstitial lung disease.   Electronically Signed   By: Claudie Revering M.D.   On: 04/15/2015 17:13   Dg Chest Port 1 View  04/17/2015   CLINICAL DATA:  Pneumothorax.  Left-sided chest tubes.  EXAM: PORTABLE CHEST 1 VIEW  COMPARISON:  03/23/2015  FINDINGS: Support devices are stable. Heart is normal size. No confluent airspace opacities or effusions.  IMPRESSION: Stable support devices.  No acute findings.   Electronically Signed   By: Rolm Baptise M.D.   On: 04/17/2015 07:24   Dg Chest Port 1 View  04/02/2015   CLINICAL DATA:  Hypoxia.  Pericardial window creation.  EXAM: PORTABLE CHEST 1 VIEW  COMPARISON:  April 15, 2015  FINDINGS: Endotracheal tube tip is 4.6 cm above the carina. Central catheter tip is in the superior vena cava. There is a mediastinal drain as well as a left chest tube. There is no demonstrable pneumomediastinum or pneumopericardium. No pneumothorax identified.  There is a small left pleural effusion. Lungs elsewhere clear. Heart size and pulmonary vascularity are normal. There are surgical clips in left axillary region. No adenopathy.  IMPRESSION: Tube and catheter positions as described without pneumothorax. Small left effusion. Lungs elsewhere clear. Cardiac silhouette within normal limits.   Electronically Signed   By: Lowella Grip III M.D.   On: 04/14/2015 17:15   Dg Abd Portable 1v  04/06/2015   CLINICAL DATA:   Orogastric tube placement.  EXAM: PORTABLE ABDOMEN - 1 VIEW  COMPARISON:  Chest radiograph earlier this day.  FINDINGS: Tip and side port of the enteric tube below the diaphragm in the stomach. A mediastinal drain is in place. Midline skin staples are noted in the abdomen. No dilated bowel loops are seen.  IMPRESSION: Tip and side port of the enteric tube below the diaphragm in the stomach.   Electronically Signed   By: Jeb Levering M.D.   On: 03/28/2015 21:25    ASSESSMENT / PLAN:  Pericardial effusion - echo showed increase in size since prior study, hemodynamic compromise c/w tamponade. S/p pericardial window on 9/27; pericardial and L pleural drains in place; fluid was clear Hx dCHF, HLD, CAD. Plan: Await cytology data pericardial and chest tubes to suction for now  HCAP. Acute hypoxic respiratory failure - due to above and post-operatively Bilateral pleural effusions - s/p thoracentesis 9/26 R with 786mL of rusty colored fluid obtained. Concerning for malignant effusion. Right pulmonary nodule - concerning for mets. Plan: Extubated this am 9/28 Continue empiric abx. Follow cultures. BD's as ordered. F/u on pleural fluid studies / cytology. Consider repeat CT scan to  better visualized R peripheral nodule now post thoracentesis; consider also Needle biopsy of this depending on the other cytology results.  Being followed by Dr. Marin Olp of oncology.  Hyponatremia. Hypokalemia - s/p 53mEq PO on admit. Pseodohypocalcemia - corrects to 9.32. Plan: BMP, ionized calcium now. Correct electrolytes as indicated. 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 Plan: Ensure pt receives pneumococcal and H.flu vaccines   MGUS. Plan: Being followed by Dr. Marin Olp of oncology.  Independent CC time 40 minutes  Baltazar Apo, MD, PhD 04/17/2015, 12:02 PM [facility] [phone] or if no answer [phone]

## 2015-04-17 NOTE — Procedures (Signed)
Extubation Procedure Note  Patient Details:   Name: [name] [name] DOB: 04-Jul-1935 MRN: [id number]   Airway Documentation:     Evaluation  O2 sats: stable throughout Complications: No apparent complications Patient did tolerate procedure well. Bilateral Breath Sounds: Clear   Yes  Patient tolerated wean. MD ordered to extubate. Positive for cuff leak. Patient extubated to a 3 Lpm nasal cannula. No signs of dyspnea or stridor. Patient instructed on the Incentive Spirometer, achieving 1100 mL five times. RN at beside. Will continue to monitor.  Myrtie Neither 04/17/2015, 12:21 PM

## 2015-04-17 NOTE — Progress Notes (Signed)
Mrs. Ketchem is now intubated post op.  She had 500cc of fluid removed form pericardial sac!!!  Will need to see the path results. Hopefully, the pathology report from the pleural effusion that was taken out on Monday will be back today.  With her CEA and CA 27.29 being normal, again, I would think that recurrent breast cancer would be unlikely.  I suppose that she may have a non-small cell lung cancer if this is found to be malignant. If this is adenocarcinoma, then we will have to send off for the typical genetic mutation studies.  Her labs show potassium 2.7. Her white cell count and platelet count are holding steady. Her hemoglobin is 11.2.  There's been no obvious bleeding.  Her blood sugars are on the high side.  On her physical exam, her vital signs are all stable. Her temperature 97.6. Pulse 85. Blood pressure 110/48. Her lungs show some slight decreased bilaterally. She has some occasional wheezes. Cardiac exam regular rate and rhythm with no murmurs, rubs or bruits. Abdomen is soft. She has no fluid wave. She has slight decreased bowel sounds. Extremities shows no clubbing, cyanosis or edema.  At this point, we have to await the pathology reports. These will really dictate what is done next.  I really hope that we are not dealing with a malignancy. However, it certainly would not surprise me given all she has gone through lately.  As always, I prayed for her. She does have a strong faith.  [name].  2 Timothy 1:7

## 2015-04-17 NOTE — Progress Notes (Addendum)
ANTIBIOTIC CONSULT NOTE - INITIAL  Pharmacy Consult for Vancomycin, Ceftazidime Indication: HCAP  No Known Allergies  Patient Measurements: Height: 5' 8.5" (174 cm) Weight: 153 lb 3.5 oz (69.5 kg) IBW/kg (Calculated) : 65.05  Vital Signs: Temp: 97.2 F (36.2 C) (09/28 1227) Temp Source: Oral (09/28 1200) BP: 98/45 mmHg (09/28 1400) Pulse Rate: 95 (09/28 1400) Intake/Output from previous day: 09/27 0701 - 09/28 0700 In: 4406.7 [I.V.:2106.7; IV Piggyback:2300] Out: 2585 [Urine:1000; Blood:10] Intake/Output from this shift: Total I/O In: 774.5 [I.V.:524.5; IV Piggyback:250] Out: 1196 [Urine:300; Chest Tube:896]  Labs:  Recent Labs  04/08/2015 0032 03/27/2015 1022 04/03/2015 1150 04/15/2015 1915 04/17/15 0510  WBC  --  5.5 5.6 6.3 10.2  HGB  --  11.6* 11.5* 9.0* 11.2*  PLT  --  284 292 218 219  CREATININE 0.90 0.77  --   --  0.89   Estimated Creatinine Clearance: 53.5 mL/min (by C-G formula based on Cr of 0.89).  Recent Labs  04/17/15 1358  [place] 6*     Microbiology: Recent Results (from the past 720 hour(s))  Culture, blood (x 2)     Status: None   Collection Time: 03/31/15  1:30 AM  Result Value Ref Range Status   Specimen Description BLOOD RIGHT ARM  Final   Special Requests BOTTLES DRAWN AEROBIC ONLY 5CC  Final   Culture   Final    NO GROWTH 5 DAYS Performed at [facility]    Report Status 04/05/2015 FINAL  Final  Culture, blood (x 2)     Status: None   Collection Time: 03/31/15  1:50 AM  Result Value Ref Range Status   Specimen Description BLOOD RIGHT HAND  Final   Special Requests BOTTLES DRAWN AEROBIC ONLY 5CC  Final   Culture   Final    NO GROWTH 5 DAYS Performed at [facility]    Report Status 04/05/2015 FINAL  Final  Surgical pcr screen     Status: None   Collection Time: 04/04/15 10:36 PM  Result Value Ref Range Status   MRSA, PCR NEGATIVE NEGATIVE Final   Staphylococcus aureus NEGATIVE NEGATIVE Final    Comment:         The Xpert SA Assay (FDA approved for NASAL specimens in patients over 9 years of age), is one component of a comprehensive surveillance program.  Test performance has been validated by [facility] for patients greater than or equal to 22 year old. It is not intended to diagnose infection nor to guide or monitor treatment.   Blood Culture (routine x 2)     Status: None (Preliminary result)   Collection Time: 03/29/2015  1:30 PM  Result Value Ref Range Status   Specimen Description BLOOD LEFT ANTECUBITAL  Final   Special Requests BOTTLES DRAWN AEROBIC AND ANAEROBIC 10 CC EACH  Final   Culture   Final    NO GROWTH 3 DAYS Performed at [facility]    Report Status PENDING  Incomplete  Blood Culture (routine x 2)     Status: None (Preliminary result)   Collection Time: 04/08/2015  1:40 PM  Result Value Ref Range Status   Specimen Description BLOOD LEFT HAND  Final   Special Requests BOTTLES DRAWN AEROBIC AND ANAEROBIC 5 CC EACH  Final   Culture   Final    NO GROWTH 3 DAYS Performed at [facility]    Report Status PENDING  Incomplete  Urine culture     Status: None  Collection Time: 03/31/2015  2:00 PM  Result Value Ref Range Status   Specimen Description URINE, CATHETERIZED  Final   Special Requests NONE  Final   Culture   Final    NO GROWTH 1 DAY Performed at [facility]    Report Status 04/15/2015 FINAL  Final  MRSA PCR Screening     Status: None   Collection Time: 03/25/2015  5:30 PM  Result Value Ref Range Status   MRSA by PCR NEGATIVE NEGATIVE Final    Comment:        The GeneXpert MRSA Assay (FDA approved for NASAL specimens only), is one component of a comprehensive MRSA colonization surveillance program. It is not intended to diagnose MRSA infection nor to guide or monitor treatment for MRSA infections.   Urine culture     Status: None   Collection Time: 03/22/2015  6:00 PM  Result Value Ref Range Status   Specimen Description URINE,  CLEAN CATCH  Final   Special Requests NONE  Final   Culture   Final    NO GROWTH 1 DAY Performed at [facility]    Report Status 04/15/2015 FINAL  Final  Culture, sputum-assessment     Status: None   Collection Time: 04/15/15  1:17 AM  Result Value Ref Range Status   Specimen Description SPU  Final   Special Requests NONE  Final   Sputum evaluation   Final    THIS SPECIMEN IS ACCEPTABLE. RESPIRATORY CULTURE REPORT TO FOLLOW.   Report Status 04/15/2015 FINAL  Final  Culture, respiratory (NON-Expectorated)     Status: None   Collection Time: 04/15/15  1:17 AM  Result Value Ref Range Status   Specimen Description SPUTUM  Final   Special Requests NONE  Final   Gram Stain   Final    ABUNDANT WBC PRESENT, PREDOMINANTLY PMN RARE SQUAMOUS EPITHELIAL CELLS PRESENT FEW GRAM NEGATIVE RODS Performed at [facility]    Culture   Final    FEW STREPTOCOCCUS,BETA HEMOLYTIC NOT GROUP A Performed at [facility]    Report Status 04/17/2015 FINAL  Final  Fungal stain     Status: None   Collection Time: 04/15/15  4:58 PM  Result Value Ref Range Status   Specimen Description PLEURAL RIGHT  Final   Special Requests NONE  Final   Fungal Smear   Final    NO YEAST OR FUNGAL ELEMENTS SEEN Performed at [facility]    Report Status 04/19/2015 FINAL  Final  Culture, body fluid-bottle     Status: None (Preliminary result)   Collection Time: 04/15/15  4:58 PM  Result Value Ref Range Status   Specimen Description FLUID RIGHT PLEURAL  Final   Special Requests NONE  Final   Gram Stain   Final    RARE WBC PRESENT, PREDOMINANTLY MONONUCLEAR NO ORGANISMS SEEN Performed at [facility]    Culture PENDING  Incomplete   Report Status PENDING  Incomplete  Anaerobic culture     Status: None (Preliminary result)   Collection Time: 04/18/2015  2:49 PM  Result Value Ref Range Status   Specimen Description FLUID PERICARDIUM  Final   Special Requests PART B  Final    Gram Stain   Final    NO ANAEROBES ISOLATED; CULTURE IN PROGRESS FOR 5 DAYS   Culture PENDING  Incomplete   Report Status PENDING  Incomplete  Culture, body fluid-bottle     Status: None (Preliminary result)   Collection Time:  03/25/2015  2:49 PM  Result Value Ref Range Status   Specimen Description FLUID PERICARDIUM  Final   Special Requests BOTTLES DRAWN AEROBIC ONLY 10CC PART B  Final   Culture NO GROWTH < 24 HOURS  Final   Report Status PENDING  Incomplete  Gram stain     Status: None   Collection Time: 04/08/2015  2:49 PM  Result Value Ref Range Status   Specimen Description FLUID PERICARDIUM  Final   Special Requests PART B  Final   Gram Stain   Final    CYTOSPIN SMEAR WBC PRESENT, PREDOMINANTLY MONONUCLEAR NO ORGANISMS SEEN    Report Status 03/24/2015 FINAL  Final  Anaerobic culture     Status: None (Preliminary result)   Collection Time: 04/10/2015  2:50 PM  Result Value Ref Range Status   Specimen Description TISSUE PERICARDIUM  Final   Special Requests PART C  Final   Gram Stain   Final    RARE WBC PRESENT,BOTH PMN AND MONONUCLEAR NO ORGANISMS SEEN Performed at [facility]    Culture   Final    NO ANAEROBES ISOLATED; CULTURE IN PROGRESS FOR 5 DAYS Performed at [facility]    Report Status PENDING  Incomplete  Tissue culture     Status: None (Preliminary result)   Collection Time: 04/14/2015  2:50 PM  Result Value Ref Range Status   Specimen Description TISSUE PERICARDIUM  Final   Special Requests PART C  Final   Gram Stain   Final    RARE WBC PRESENT,BOTH PMN AND MONONUCLEAR NO ORGANISMS SEEN Performed at [facility]    Culture NO GROWTH Performed at [facility]   Final   Report Status PENDING  Incomplete  Anaerobic culture     Status: None (Preliminary result)   Collection Time: 04/13/2015  2:56 PM  Result Value Ref Range Status   Specimen Description TISSUE PERICARDIUM  Final   Special Requests PART D  Final   Gram Stain    Final    NO WBC SEEN NO ORGANISMS SEEN Performed at [facility]    Culture   Final    NO ANAEROBES ISOLATED; CULTURE IN PROGRESS FOR 5 DAYS Performed at [facility]    Report Status PENDING  Incomplete  Tissue culture     Status: None (Preliminary result)   Collection Time: 04/11/2015  2:56 PM  Result Value Ref Range Status   Specimen Description TISSUE PERICARDIUM  Final   Special Requests PART D  Final   Gram Stain   Final    NO WBC SEEN NO ORGANISMS SEEN Performed at [facility]    Culture NO GROWTH Performed at [facility]   Final   Report Status PENDING  Incomplete  Anaerobic culture     Status: None (Preliminary result)   Collection Time: 04/04/2015  3:03 PM  Result Value Ref Range Status   Specimen Description FLUID LEFT PLEURAL  Final   Special Requests PART F  Final   Gram Stain   Final    NO ANAEROBES ISOLATED; CULTURE IN PROGRESS FOR 5 DAYS   Culture PENDING  Incomplete   Report Status PENDING  Incomplete  Culture, body fluid-bottle     Status: None (Preliminary result)   Collection Time: 03/23/2015  3:03 PM  Result Value Ref Range Status   Specimen Description FLUID LEFT PLEURAL  Final   Special Requests PART F BAA 10CC  Final   Culture NO GROWTH <  24 HOURS  Final   Report Status PENDING  Incomplete  Gram stain     Status: None   Collection Time: 04/12/2015  3:03 PM  Result Value Ref Range Status   Specimen Description FLUID LEFT PLEURAL  Final   Special Requests BOTTLES DRAWN AEROBIC AND ANAEROBIC 10CC PART F  Final   Gram Stain   Final    CYTOSPIN SMEAR WBC PRESENT,BOTH PMN AND MONONUCLEAR NO ORGANISMS SEEN    Report Status 04/17/2015 FINAL  Final    Medical History: Past Medical History  Diagnosis Date  . Abnormal WBC count     low, h/o  . Other and unspecified hyperlipidemia   . Unspecified essential hypertension   . OP (osteoporosis)   . Vitamin D deficiency   . MGUS (monoclonal gammopathy of unknown  significance) 03/16/2013  . Back pain   . Breast cancer 1981    mastectomy and lymph node removal  . DM type 2 (diabetes mellitus, type 2)     Pt states she is 'prediabetic'    Medications:  Anti-infectives    Start     Dose/Rate Route Frequency Ordered Stop   04/17/15 2200  vancomycin (VANCOCIN) 500 mg in sodium chloride 0.9 % 100 mL IVPB     500 mg 100 mL/hr over 60 Minutes Intravenous Every 8 hours 04/17/15 1510     03/26/2015 1130  cefUROXime (ZINACEF) 1.5 g in dextrose 5 % 50 mL IVPB     1.5 g 100 mL/hr over 30 Minutes Intravenous To Surgery 03/22/2015 1101 04/06/2015 1418   04/15/15 0200  vancomycin (VANCOCIN) 500 mg in sodium chloride 0.9 % 100 mL IVPB  Status:  Discontinued     500 mg 100 mL/hr over 60 Minutes Intravenous Every 12 hours 04/04/2015 1335 04/13/2015 1743   04/15/15 0200  vancomycin (VANCOCIN) 500 mg in sodium chloride 0.9 % 100 mL IVPB  Status:  Discontinued     500 mg 100 mL/hr over 60 Minutes Intravenous Every 12 hours 04/03/2015 1930 04/17/15 1510   04/13/2015 2200  cefTAZidime (FORTAZ) 2 g in dextrose 5 % 50 mL IVPB     2 g 100 mL/hr over 30 Minutes Intravenous 3 times per day 04/12/2015 1931     04/05/2015 1645  cefTAZidime (FORTAZ) 2 g in dextrose 5 % 50 mL IVPB  Status:  Discontinued     2 g 100 mL/hr over 30 Minutes Intravenous 3 times per day 04/02/2015 1641 04/01/2015 1644   04/04/2015 1330  cefTAZidime (FORTAZ) 2 g in dextrose 5 % 50 mL IVPB  Status:  Discontinued     2 g 100 mL/hr over 30 Minutes Intravenous 3 times per day 03/28/2015 1309 04/07/2015 1743   04/16/2015 1315  vancomycin (VANCOCIN) IVPB 1000 mg/200 mL premix     1,000 mg 200 mL/hr over 60 Minutes Intravenous  Once 04/04/2015 1309 04/07/2015 1512     Assessment: 80 y.o. female with remote Hx breast cancer s/p presents 9/25 from NH with fever, cough, and congestion. Discharged from hospital 2 days prior after admission for splenectomy.  Pharmacy to dose vancomycin and ceftazidime for HCAP and r/o sepsis.  9/25 >>  vancomycin >> 9/25 >> ceftazidime >>    Cultures pending. Scr stable 0.89  Febrile overnight - Tmax: 103.5. WBC 10.5 Vancomycin trough: 6 (Of note: one dose was missed 9/27)  Goal of Therapy:  Vancomycin trough level 15-20 mcg/ml  Eradication of infection Appropriate antibiotic dosing for indication and renal function  Plan:  -  Increase vancomycin frequency to 500 mg IV q8 hr - Vancomycin trough at new steady state as indicated - Continue Ceftazidime 2 g IV q8 hrs - Follow clinical course, renal function, culture results as available - Follow for de-escalation of antibiotics and LOT   Melburn Popper, PharmD Clinical Pharmacy Resident Pager: [phone] 04/17/2015 3:18 PM

## 2015-04-17 NOTE — Progress Notes (Signed)
MRN: [id number] Name: [name] [name]  Sex: female Age: 80 y.o. DOB: [birth year]  [place] #:  Facility/Room: Level Of Care: SNF Provider: Inocencio Homes D Emergency Contacts: Extended Emergency Contact Information Primary Emergency Contact: Hall,Laura Address: [address]          [place], [place] Work Phone: [phone] Mobile Phone: [phone] Relation: Daughter Secondary Emergency Contact: Bjorn Loser [place] Phone: [phone] Relation: Son  Code Status:   Allergies: Review of patient's allergies indicates no known allergies.  Chief Complaint  Patient presents with  . New Admit To SNF    HPI: Patient is 80 y.o. female who  Past Medical History  Diagnosis Date  . Abnormal WBC count     low, h/o  . Other and unspecified hyperlipidemia   . Unspecified essential hypertension   . OP (osteoporosis)   . Vitamin D deficiency   . MGUS (monoclonal gammopathy of unknown significance) 03/16/2013  . Back pain   . Breast cancer 1981    mastectomy and lymph node removal  . DM type 2 (diabetes mellitus, type 2)     Pt states she is 'prediabetic'    Past Surgical History  Procedure Laterality Date  . Mastectomy      left  . Breast biopsy      right  . Breast reconstruction    . Skin cancer removal      basal cell skin cancer  . Wisdom tooth extraction    . Tooth extraction    . Laparoscopic splenectomy N/A 04/05/2015    Procedure: LAPAROSCOPIC SPLENECTOMY;  Surgeon: Johnathan Hausen, MD;  Location: [facility];  Service: General;  Laterality: N/A;      Medication List    Notice    This visit is during an admission. Changes to the med list made in this visit will be reflected in the After Visit Summary of the admission.      No orders of the defined types were placed in this encounter.    Immunization History  Administered Date(s) Administered  . Influenza Split 08/16/2012  . Influenza,inj,Quad PF,36+ Mos  07/17/2013, 03/15/2015  . Meningococcal Polysaccharide 04/03/2015    Social History  Substance Use Topics  . Smoking status: Former Smoker -- 1.00 packs/day for 10 years    Types: Cigarettes    Start date: 05/15/1956    Quit date: 07/05/1966  . Smokeless tobacco: Never Used     Comment: quit 45 years ago  . Alcohol Use: No    Family history is    Review of Systems  DATA OBTAINED: from patient, nurse, medical record, family member GENERAL:  no fevers, fatigue, appetite changes SKIN: No itching, rash or wounds EYES: No eye pain, redness, discharge EARS: No earache, tinnitus, change in hearing NOSE: No congestion, drainage or bleeding  MOUTH/THROAT: No mouth or tooth pain, No sore throat RESPIRATORY: No cough, wheezing, SOB CARDIAC: No chest pain, palpitations, lower extremity edema  GI: No abdominal pain, No N/V/D or constipation, No heartburn or reflux  GU: No dysuria, frequency or urgency, or incontinence  MUSCULOSKELETAL: No unrelieved bone/joint pain NEUROLOGIC: No headache, dizziness or focal weakness PSYCHIATRIC: No c/o anxiety or sadness   Filed Vitals:   04/17/15 1227  BP: 94/61  Pulse: 53  Temp: 97.2 F (36.2 C)  Resp: 18    SpO2 Readings from Last 1 Encounters:  04/17/15 100%        Physical Exam  GENERAL APPEARANCE: Alert, conversant,  No acute distress.  SKIN: No diaphoresis rash HEAD: Normocephalic, atraumatic  EYES: Conjunctiva/lids clear. Pupils round, reactive. EOMs intact.  EARS: External exam WNL, canals clear. Hearing grossly normal.  NOSE: No deformity or discharge.  MOUTH/THROAT: Lips w/o lesions  RESPIRATORY: Breathing is even, unlabored. Lung sounds are clear   CARDIOVASCULAR: Heart RRR no murmurs, rubs or gallops. No peripheral edema.   GASTROINTESTINAL: Abdomen is soft, non-tender, not distended w/ normal bowel sounds. GENITOURINARY: Bladder non tender, not distended  MUSCULOSKELETAL: No abnormal joints or musculature NEUROLOGIC:   Cranial nerves 2-12 grossly intact. Moves all extremities  PSYCHIATRIC: Mood and affect appropriate to situation, no behavioral issues  Patient Active Problem List   Diagnosis Date Noted  . Chest tube in place   . Endotracheally intubated   . Pleural effusion   . Lung nodules   . Pleural effusion on right   . Pericardial tamponade 04/15/2015  . HCAP (healthcare-associated pneumonia) 04/08/2015  . Sepsis due to pneumonia 03/26/2015  . Acute respiratory failure with hypoxia 04/15/2015  . Hypokalemia 03/24/2015  . Pericardial effusion 04/12/2015  . Pressure ulcer 04/01/2015  . S/P laparoscopic splenectomy 04/05/2015  . Generalized weakness 03/30/2015  . HLD (hyperlipidemia) 03/30/2015  . Malnutrition of moderate degree 03/18/2015  . Chronic diastolic heart failure   . Lung nodule 03/14/2015  . Adrenal nodule 03/14/2015  . MGUS (monoclonal gammopathy of unknown significance) 03/16/2013    CBC    Component Value Date/Time   WBC 10.2 04/17/2015 0510   WBC 2.1* 10/08/2014 1321   WBC 2.3* 12/19/2007 0820   RBC 3.75* 04/17/2015 0510   RBC 3.71 10/08/2014 1321   RBC 3.79* 04/01/2011 0959   RBC 4.32 12/19/2007 0820   HGB 11.2* 04/17/2015 0510   HGB 12.2 10/08/2014 1321   HGB 14.0 12/19/2007 0820   HCT 33.3* 04/17/2015 0510   HCT 35.9 10/08/2014 1321   HCT 41.0 12/19/2007 0820   PLT 219 04/17/2015 0510   PLT 86* 10/08/2014 1321   PLT 110* 12/19/2007 0820   MCV 88.8 04/17/2015 0510   MCV 97 10/08/2014 1321   MCV 94.9 12/19/2007 0820   LYMPHSABS 0.5* 04/10/2015 2115   LYMPHSABS 0.2* 10/08/2014 1321   LYMPHSABS 0.3* 12/19/2007 0820   MONOABS 0.5 04/09/2015 2115   MONOABS 0.2 12/19/2007 0820   EOSABS 0.1 03/29/2015 2115   EOSABS 0.1 10/08/2014 1321   EOSABS 0.1 12/19/2007 0820   BASOSABS 0.1 04/12/2015 2115   BASOSABS 0.0 10/08/2014 1321   BASOSABS 0.0 12/19/2007 0820    CMP     Component Value Date/Time   NA 128* 04/17/2015 0510   NA 137 10/08/2014 1321   K 2.7*  04/17/2015 0510   K 3.3 10/08/2014 1321   CL 97* 04/17/2015 0510   CL 98 10/08/2014 1321   CO2 21* 04/17/2015 0510   CO2 31 10/08/2014 1321   GLUCOSE 278* 04/17/2015 0510   GLUCOSE 98 10/08/2014 1321   BUN <5* 04/17/2015 0510   BUN 5* 10/08/2014 1321   CREATININE 0.89 04/17/2015 0510   CREATININE 1.0 10/08/2014 1321   CALCIUM 7.2* 04/17/2015 0510   CALCIUM 9.4 10/08/2014 1321   PROT 4.1* 04/15/2015 1424   PROT 6.9 10/08/2014 1321   ALBUMIN 2.1* 04/15/2015 0515   AST 60* 04/15/2015 0515   AST 29 10/08/2014 1321   ALT 20 04/15/2015 0515   ALT 15 10/08/2014 1321   ALKPHOS 131* 04/15/2015 0515   ALKPHOS 71 10/08/2014 1321   BILITOT 0.8 04/15/2015 0515  BILITOT 0.80 10/08/2014 1321   GFRNONAA >60 04/17/2015 0510   GFRAA >60 04/17/2015 0510    Lab Results  Component Value Date   HGBA1C 5.7* 03/31/2015     Dg Chest 1 View  04/15/2015   CLINICAL DATA:  Status post thoracentesis.  EXAM: CHEST 1 VIEW  COMPARISON:  04/18/2015 and chest CT dated 04/15/2015.  FINDINGS: Stable enlarged cardiac silhouette. No pleural fluid seen on the right at this time. Moderate-sized left pleural effusion and associated left basilar airspace opacity. The interstitial markings remain prominent. No pneumothorax. The previously seen nodular density at the right lung base is no longer demonstrated. Diffuse osteopenia. Left axillary surgical clips.  IMPRESSION: 1. No pneumothorax. 2. No right pleural fluid seen. 3. Moderate-sized left pleural effusion and left basilar atelectasis or pneumonia. 4. Stable cardiomegaly and chronic interstitial lung disease.   Electronically Signed   By: Claudie Revering M.D.   On: 04/15/2015 17:13   Dg Chest 2 View  03/24/2015   CLINICAL DATA:  80 year old female with shortness of breath and productive cough. Status post laparoscopic splenectomy on 04/05/2015. Fever since 04/12/2015 up to 103.1 degrees Fahrenheit. No associated chest pain. History breast cancer.  EXAM: CHEST  2 VIEW   COMPARISON:  Chest x-ray 03/30/2015.  FINDINGS: Small to moderate left pleural effusion. Opacity at the left base may reflect atelectasis and/or consolidation. Large nodule in the periphery of the right lower lobe again noted (better demonstrated on 03/15/2015 chest CT). No evidence of pulmonary edema. Cardiopericardial silhouette is mildly enlarged. Upper mediastinal contours are within normal limits. Atherosclerosis in the thoracic aorta. Status post left axillary nodal dissection.  IMPRESSION: 1. Interval development of a small to moderate left pleural effusion with associated atelectasis and/or consolidation in the left lower lobe. 2. Mild enlargement of the cardiopericardial silhouette could suggest cardiomegaly and/or underlying pericardial effusion. 3. Large pulmonary nodule in the periphery of the right lower lobe remain suspicious for a metastatic lesion in this patient with history of breast cancer. 4. Atherosclerosis.   Electronically Signed   By: Vinnie Langton M.D.   On: 03/28/2015 14:34   Ct Chest W Contrast  04/15/2015   CLINICAL DATA:  Breast cancer. Cough, congestion. Sepsis secondary to pneumonia. Acute respiratory failure.  EXAM: CT CHEST WITH CONTRAST  TECHNIQUE: Multidetector CT imaging of the chest was performed during intravenous contrast administration.  CONTRAST:  52mL OMNIPAQUE IOHEXOL 300 MG/ML  SOLN  COMPARISON:  Chest x-ray 03/30/2015.  Chest CT 03/14/2015.  FINDINGS: New bilateral pleural effusions since prior CT, moderate in size with compressive atelectasis in the lower lobes. Moderate pericardial effusion has increased in size since prior study. Heart is normal size. Small scattered mediastinal lymph nodes, none pathologically enlarged. Aorta is normal caliber.  Left breast implant noted. Surgical changes in the left breast and axilla. Chest wall soft tissues otherwise unremarkable.  Imaging into the upper abdomen shows no acute findings. Low-density lesions within the liver  likely reflects cysts. Large bilateral adrenal lesions are again noted, stable.  Right basilar nodular opacity again noted, measuring 2.3 cm compared to 1.9 cm previously.  No acute bony abnormality or focal bone lesion.  IMPRESSION: New moderate bilateral pleural effusions since prior CT. Compressive atelectasis in the lower lobes.  Enlarging moderate pericardial effusion.  Enlarging right lower lobe nodule, 2.3 cm compared with 1.8 cm previously.  Stable bilateral adrenal masses.   Electronically Signed   By: Rolm Baptise M.D.   On: 04/15/2015 11:09    Not  all labs, radiology exams or other studies done during hospitalization come through on my EPIC note; however they are reviewed by me.    Assessment and Plan  No problem-specific assessment & plan notes found for this encounter.   Hennie Duos, MD    This encounter was created in error - please disregard.

## 2015-04-17 NOTE — Progress Notes (Addendum)
      [address]       [place] [address]             [phone]       1 Day Post-Op Procedure(s) (LRB): SUBXYPHOID PERICARDIAL WINDOW (N/A) CHEST TUBE INSERTION (Left) TRANSESOPHAGEAL ECHOCARDIOGRAM (TEE) (N/A)  Subjective: Patient awake, alert on vent.  Objective: Vital signs in last 24 hours: Temp:  [97.2 F (36.2 C)-103.5 F (39.7 C)] 97.8 F (36.6 C) (09/28 0700) Pulse Rate:  [40-113] 87 (09/28 0800) Cardiac Rhythm:  [-] Normal sinus rhythm (09/28 0700) Resp:  [14-32] 20 (09/28 0800) BP: (69-165)/(21-110) 111/56 mmHg (09/28 0800) SpO2:  [78 %-100 %] 100 % (09/28 0815) Arterial Line BP: (67-172)/(32-102) 128/60 mmHg (09/28 0800) FiO2 (%):  [30 %-60 %] 30 % (09/28 0815) Weight:  [153 lb 3.5 oz (69.5 kg)] 153 lb 3.5 oz (69.5 kg) (09/28 0500)   Hemodynamic parameters for last 24 hours: CVP:  [0 mmHg-9 mmHg] 8 mmHg  Intake/Output from previous day: 09/27 0701 - 09/28 0700 In: 4349.2 [I.V.:2099.2; IV Piggyback:2250] Out: 5885 [Urine:1000; Blood:10; Chest Tube:330]   Physical Exam:  Cardiovascular: RRR Pulmonary: Slightly diminished at bases bilaterally; no rales, wheezes, or rhonchi. Abdomen: Soft, non tender, bowel sounds present. Extremities: SCDs in place Wounds: Clean and dry.  No erythema or signs of infection. Left chest Tube:to suction, no air leak. There was some light yellowish drainage around chest tube site last night. Dressing just changed and is dry. Pericardial Tube:to suction  Lab Results: CBC: Recent Labs  03/22/2015 1915 04/17/15 0510  WBC 6.3 10.2  HGB 9.0* 11.2*  HCT 26.7* 33.3*  PLT 218 219   BMET:  Recent Labs  04/15/2015 1022 04/17/15 0510  NA 127* 128*  K 3.3* 2.7*  CL 90* 97*  CO2 27 21*  GLUCOSE 71 278*  BUN <5* <5*  CREATININE 0.77 0.89  CALCIUM 8.1* 7.2*    PT/INR:  Recent Labs  04/10/2015 1150  LABPROT 18.7*  INR 1.56*   ABG:  INR: Will add last result for INR, ABG once components are  confirmed Will add last 4 CBG results once components are confirmed  Assessment/Plan:  1. CV - SR in the 80's. 2.  Pulmonary - Chest tube and pericardial tube are to suction. No air leak. Chest tubes Pleura vacs changed out this am. Both tubes to remain to suction for now.  CXR appears to show trace left apical pneumothorax. Await cultures. Vent mangeament per pulmonary/CCM. Hope to extubate soon. 3. Fever to 103.5 last evening.WBC 10,200. No sign of pericardial wound infection. 4. Anemia-H and H stable at 11.2 and 33.3 5. Hypokalemia-potassium 2.7. Given supplement 6.ID-On Vancomycin and Fortaz IV for HCAP 7. Will remove a line later this am  [name],[name] MPA-C 04/17/2015,8:24 AM  Patient examined and chest x-ray reviewed She is now off pressor worse. I would expect that she can be extubated today and mobilize out of bed We'll leave both the left pleural and pericardial drains in place on suction Followup urine cultures-so far negative I suspect she may be an adrenal insufficient and would continue IV steroids for now  patient examined and medical record reviewed,agree with above note. [name] 04/17/2015

## 2015-04-17 NOTE — Care Management Important Message (Signed)
Important Message  Patient Details  Name: [name] [name] MRN: [id number] Date of Birth: July 10, 1935   Medicare Important Message Given:  Yes-second notification given    Delorse Lek 04/17/2015, 12:45 PM

## 2015-04-17 NOTE — Progress Notes (Signed)
eLink Physician-Brief Progress Note Patient Name: [name] [name] DOB: 1934-11-08 MRN: [id number]   Date of Service  04/17/2015  HPI/Events of Note  l   k low  eICU Interventions  k     Intervention Category Intermediate Interventions: Electrolyte abnormality - evaluation and management  [name]. 04/17/2015, 7:04 AM

## 2015-04-18 ENCOUNTER — Inpatient Hospital Stay (HOSPITAL_COMMUNITY): Payer: Medicare Other

## 2015-04-18 DIAGNOSIS — J948 Other specified pleural conditions: Secondary | ICD-10-CM

## 2015-04-18 DIAGNOSIS — I314 Cardiac tamponade: Secondary | ICD-10-CM

## 2015-04-18 DIAGNOSIS — E876 Hypokalemia: Secondary | ICD-10-CM

## 2015-04-18 LAB — GLUCOSE, CAPILLARY
GLUCOSE-CAPILLARY: 141 mg/dL — AB (ref 65–99)
GLUCOSE-CAPILLARY: 149 mg/dL — AB (ref 65–99)
GLUCOSE-CAPILLARY: 105 mg/dL — AB (ref 65–99)
Glucose-Capillary: 152 mg/dL — ABNORMAL HIGH (ref 65–99)
Glucose-Capillary: 151 mg/dL — ABNORMAL HIGH (ref 65–99)

## 2015-04-18 LAB — COMPREHENSIVE METABOLIC PANEL
ALBUMIN: 1.5 g/dL — AB (ref 3.5–5.0)
ALT: 31 U/L (ref 14–54)
AST: 98 U/L — AB (ref 15–41)
Alkaline Phosphatase: 119 U/L (ref 38–126)
Anion gap: 8 (ref 5–15)
BUN: 7 mg/dL (ref 6–20)
CHLORIDE: 103 mmol/L (ref 101–111)
CO2: 21 mmol/L — AB (ref 22–32)
CREATININE: 0.78 mg/dL (ref 0.44–1.00)
Calcium: 7.4 mg/dL — ABNORMAL LOW (ref 8.9–10.3)
GFR calc Af Amer: >60 mL/min (ref >60)
GFR calc non Af Amer: >60 mL/min (ref >60)
GLUCOSE: 170 mg/dL — AB (ref 65–99)
Potassium: 4.1 mmol/L (ref 3.5–5.1)
SODIUM: 132 mmol/L — AB (ref 135–145)
Total Bilirubin: 0.3 mg/dL (ref 0.3–1.2)
Total Protein: 3.7 g/dL — ABNORMAL LOW (ref 6.5–8.1)

## 2015-04-18 LAB — CBC
HCT: 32.4 % — ABNORMAL LOW (ref 36.0–46.0)
Hemoglobin: 11 g/dL — ABNORMAL LOW (ref 12.0–15.0)
MCH: 29.8 pg (ref 26.0–34.0)
MCHC: 34 g/dL (ref 30.0–36.0)
MCV: 87.8 fL (ref 78.0–100.0)
PLATELETS: 200 10*3/uL (ref 150–400)
RBC: 3.69 MIL/uL — AB (ref 3.87–5.11)
RDW: 14.3 % (ref 11.5–15.5)
WBC: 20.7 10*3/uL — AB (ref 4.0–10.5)

## 2015-04-18 LAB — MAGNESIUM: Magnesium: 2.1 mg/dL (ref 1.7–2.4)

## 2015-04-18 MED ORDER — INSULIN ASPART 100 UNIT/ML ~~LOC~~ SOLN: 0.0000 [IU] | Freq: Every day | SUBCUTANEOUS | Status: DC

## 2015-04-18 MED ORDER — INSULIN ASPART 100 UNIT/ML ~~LOC~~ SOLN
0.0000 [IU] | Freq: Three times a day (TID) | SUBCUTANEOUS | Status: DC
  Administered 2015-04-18 (×2): 2 [IU] via SUBCUTANEOUS
  Administered 2015-04-19: 3 [IU] via SUBCUTANEOUS
  Administered 2015-04-19: 2 [IU] via SUBCUTANEOUS

## 2015-04-18 MED ORDER — MAGNESIUM SULFATE 4 GM/100ML IV SOLN
4.0000 g | Freq: Once | INTRAVENOUS | Status: AC
  Administered 2015-04-18: 4 g via INTRAVENOUS
  Filled 2015-04-18: qty 100

## 2015-04-18 NOTE — Consult Note (Addendum)
   [facility] Inpatient Consult   04/18/2015  Keyanah Kozicki 1934/12/07 [id number]   Patient evaluated for [facility] services for number of admits. Spoke with inpatient Licensed CSW and inpatient RNCM. Patient is from SNF and is supposed to return there at discharge. Will engage if that plan changes.   Marthenia Rolling, MSN-Ed, RN,BSN [facility] Liaison [phone]

## 2015-04-18 NOTE — Progress Notes (Signed)
Patient Name: [name] [name] Date of Encounter: 04/18/2015   SUBJECTIVE  Now extubated. No CP or SOB. She looks good.   CURRENT MEDS . acetaminophen  1,000 mg Oral 4 times per day   Or  . acetaminophen (TYLENOL) oral liquid 160 mg/5 mL  1,000 mg Oral 4 times per day  . antiseptic oral rinse  7 mL Mouth Rinse BID  . bisacodyl  10 mg Oral Daily  . cefTAZidime (FORTAZ)  IV  2 g Intravenous 3 times per day  . escitalopram  5 mg Oral q morning - 40X  . folic acid  2 mg Oral Daily  . insulin aspart  0-24 Units Subcutaneous 4 times per day  . methylPREDNISolone (SOLU-MEDROL) injection  80 mg Intravenous Q12H  . multivitamin  5 mL Per Tube Daily  . omega-3 acid ethyl esters  1 g Oral BID  . pantoprazole  40 mg Oral QHS  . raloxifene  60 mg Oral q morning - 10a  . rosuvastatin  10 mg Oral q morning - 10a  . senna  1 tablet Oral QHS  . sodium chloride  10-40 mL Intracatheter Q12H  . vancomycin  500 mg Intravenous Q8H    OBJECTIVE  Filed Vitals:   04/18/15 0300 04/18/15 0400 04/18/15 0500 04/18/15 0600  BP: 99/41 91/39 107/83 109/45  Pulse: 84 76 88 83  Temp:  97.9 F (36.6 C)    TempSrc:  Oral    Resp: 18 16 28 21   Height:      Weight:   161 lb 6 oz (73.2 kg)   SpO2: 95% 94% 95% 97%    Intake/Output Summary (Last 24 hours) at 04/18/15 0744 Last data filed at 04/18/15 0600  Gross per 24 hour  Intake 3077.54 ml  Output   2403 ml  Net 674.54 ml   Filed Weights   04/15/15 2200 04/17/15 0500 04/18/15 0500  Weight: 148 lb 9.4 oz (67.4 kg) 153 lb 3.5 oz (69.5 kg) 161 lb 6 oz (73.2 kg)    PHYSICAL EXAM  General: Pleasant, NAD. Neuro: Alert and oriented X 3. Moves all extremities spontaneously. Psych: Normal affect. HEENT:  Normal  Neck: Supple without bruits or + JVD. Lungs:  Resp regular and unlabored. Diminished breath sound with faint bibasilar rales.  Heart: RRR no s3, s4, or murmurs. Abdomen: Soft, non-tender, non-distended, BS + x 4.  Extremities: No clubbing,  cyanosis or  edema. DP/PT/Radials 2+ and equal bilaterally.  Accessory Clinical Findings  CBC  Recent Labs  04/17/15 0510 04/18/15 0550  WBC 10.2 20.7*  HGB 11.2* 11.0*  HCT 33.3* 32.4*  MCV 88.8 87.8  PLT 219 735   Basic Metabolic Panel  Recent Labs  04/01/2015 1022 04/17/15 0510 04/18/15 0550  NA 127* 128* 132*  K 3.3* 2.7* 4.1  CL 90* 97* 103  CO2 27 21* 21*  GLUCOSE 71 278* 170*  BUN <5* <5* 7  CREATININE 0.77 0.89 0.78  CALCIUM 8.1* 7.2* 7.4*  MG 1.0*  --   --   PHOS 2.9  --   --    Liver Function Tests  Recent Labs  04/15/15 1424 04/18/15 0550  AST  --  98*  ALT  --  31  ALKPHOS  --  119  BILITOT  --  0.3  PROT 4.1* 3.7*  ALBUMIN  --  1.5*   Fasting Lipid Panel  Recent Labs  04/15/15 1424  CHOL 106   TELE  NSR  ASSESSMENT AND PLAN  Principal Problem:   Pericardial tamponade Active Problems:   MGUS (monoclonal gammopathy of unknown significance)   Lung nodule   Adrenal nodule   Chronic diastolic heart failure   Malnutrition of moderate degree   Generalized weakness   HLD (hyperlipidemia)   S/P laparoscopic splenectomy   HCAP (healthcare-associated pneumonia)   Sepsis due to pneumonia   Acute respiratory failure with hypoxia   Hypokalemia   Pericardial effusion   Pressure ulcer   Lung nodules   Pleural effusion on right   Chest tube in place   Endotracheally intubated   Pleural effusion    Plan: The pleural effusion cytology is negative for malignancy. Pending pericardial fluids and the pericardial biopsy. Drain removal per TCTS. K 4.1. BP improving, soft low.   Signed, Bhagat,Bhavinkumar PA-C   Agree with note by Robbie Lis PA-C  S/P subxyphoid pericardial window with indwelling drain and Left CT. Still with signif drainage. Cytol neg of CT. Exam benign. Mg low---->> replete. TCTS to decide when tubes/drains are D/Cd then pt can Tx tele. Will s/o now. Recheck 2D early next week.   Lorretta Harp, M.D., [place], [facility],  [place] [address] [place], [place]  [address]  [phone] 04/18/2015 8:12 AM

## 2015-04-18 NOTE — Care Management Note (Addendum)
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 Recent admit  03/30/15 through 04/12/2015 for acute encephalopathy, s/p splenectomy.  Action/Plan:   Expected Discharge Date:                  Expected Discharge Plan:  Skilled Nursing Facility  In-House Referral:  Clinical Social Work  Discharge planning Services  CM Consult  Post Acute Care Choice:    Choice offered to:     DME Arranged:    DME Agency:     HH Arranged:    [place] Agency:     Status of Service:  In process, will continue to follow  Medicare Important Message Given:      Medicare IM give by:    Date Additional Medicare IM Given:    Additional Medicare Important Message give by:     If discussed at [place] of Stay Meetings, dates discussed:    Additional Comments: Larna Daughters ([phone],  [place] Scarbro [facility])  [phone]  [place], [place] [phone] 04/18/2015, 8:54 PM

## 2015-04-18 NOTE — Progress Notes (Signed)
Physical Therapy Treatment Patient Details Name: [name] [name] MRN: [id number] DOB: [birth year] Today's Date: 04/18/2015    History of Present Illness 80 yo female adm 04/11/2015 with ARF/HCAP, recent adm a few days ago for mental status changes and weakness, laproscopic splenectomy 04/05/15 ; PMHx: syncope, pancytopenia, dCHF, breast CA, lung nodules, depression. Pt with cardiac tamponade and s/p pericardial window    PT Comments    Pt progressing well with initial difficulty with anterior translation in standing. Pt educated for sequence, gait and RW use and will continue to benefit from therapy to maximize function and independence.   Follow Up Recommendations  SNF;Supervision/Assistance - 24 hour     Equipment Recommendations       Recommendations for Other Services       Precautions / Restrictions Precautions Precautions: Fall Precaution Comments: chest tubes    Mobility  Bed Mobility Overal bed mobility: Needs Assistance Bed Mobility: Supine to Sit     Supine to sit: Min assist;HOB elevated     General bed mobility comments: cues for sequence with assist to rotate pelvis to EOB and elevate trunk with sequential cues  Transfers Overall transfer level: Needs assistance   Transfers: Sit to/from Stand Sit to Stand: Min assist         General transfer comment: cues for hand placement and safety with assist for anterior translation as pt with posterior lean with transfer and initial standing  Ambulation/Gait Ambulation/Gait assistance: Mod assist Ambulation Distance (Feet): 100 Feet Assistive device: Rolling walker (2 wheeled) Gait Pattern/deviations: Shuffle   Gait velocity interpretation: Below normal speed for age/gender General Gait Details: pt with posterior lean with mod assist for balance and anterior translation first 30' then pt able to progress to midline with only min assist for gait. Cues for position in RW and safety with assist to steer RW   Stairs            Wheelchair Mobility    Modified Rankin (Stroke Patients Only)       Balance Overall balance assessment: Needs assistance   Sitting balance-Leahy Scale: Fair       Standing balance-Leahy Scale: Poor                      Cognition Arousal/Alertness: Awake/alert Behavior During Therapy: WFL for tasks assessed/performed   Area of Impairment: Problem solving             Problem Solving: Slow processing General Comments: decreased safety wtih cues for sequence    Exercises      General Comments        Pertinent Vitals/Pain Pain Score: 4  Pain Location: surgical site  HR 102 with gait sats 92% on RA    Home Living                      Prior Function            PT Goals (current goals can now be found in the care plan section) Progress towards PT goals: Progressing toward goals    Frequency       PT Plan Current plan remains appropriate    Co-evaluation             End of Session   Activity Tolerance: Patient tolerated treatment well Patient left: in chair;with call bell/phone within reach;with chair alarm set     Time: 9242-6834 PT Time Calculation (min) (ACUTE ONLY): 31 min  Charges:  $Gait Training: 8-22  mins $Therapeutic Activity: 8-22 mins                    G Codes:      Melford Aase May 13, 2015, 9:32 AM Elwyn Reach, [place]

## 2015-04-18 NOTE — Progress Notes (Addendum)
TCTS DAILY ICU PROGRESS NOTE                   [address]            [place] [address]          [phone]   2 Days Post-Op Procedure(s) (LRB): SUBXYPHOID PERICARDIAL WINDOW (N/A) CHEST TUBE INSERTION (Left) TRANSESOPHAGEAL ECHOCARDIOGRAM (TEE) (N/A)  Total Length of Stay:  LOS: 4 days   Subjective: Feels SOB, but improving  Objective: Vital signs in last 24 hours: Temp:  [97.8 F (36.6 C)-98.1 F (36.7 C)] 97.9 F (36.6 C) (09/29 0400) Pulse Rate:  [74-105] 102 (09/29 0927) Cardiac Rhythm:  [-] Normal sinus rhythm (09/29 0800) Resp:  [16-29] 23 (09/29 0800) BP: (91-118)/(34-83) 118/55 mmHg (09/29 0800) SpO2:  [92 %-99 %] 92 % (09/29 0927) Arterial Line BP: (78-139)/(40-79) 136/63 mmHg (09/29 0800) Weight:  [161 lb 6 oz (73.2 kg)] 161 lb 6 oz (73.2 kg) (09/29 0500)  Filed Weights   04/15/15 2200 04/17/15 0500 04/18/15 0500  Weight: 148 lb 9.4 oz (67.4 kg) 153 lb 3.5 oz (69.5 kg) 161 lb 6 oz (73.2 kg)    Weight change: 8 lb 2.5 oz (3.7 kg)   Hemodynamic parameters for last 24 hours: CVP:  [6 mmHg-9 mmHg] 8 mmHg  Intake/Output from previous day: 09/28 0701 - 09/29 0700 In: 3187.5 [I.V.:2587.5; IV Piggyback:600] Out: 2403 [Urine:1235; Chest Tube:1168]  Intake/Output this shift: Total I/O In: 340 [P.O.:120; I.V.:120; IV Piggyback:100] Out: 60 [Urine:60]  Current Meds: Scheduled Meds: . acetaminophen  1,000 mg Oral 4 times per day   Or  . acetaminophen (TYLENOL) oral liquid 160 mg/5 mL  1,000 mg Oral 4 times per day  . antiseptic oral rinse  7 mL Mouth Rinse BID  . bisacodyl  10 mg Oral Daily  . cefTAZidime (FORTAZ)  IV  2 g Intravenous 3 times per day  . escitalopram  5 mg Oral q morning - 21H  . folic acid  2 mg Oral Daily  . insulin aspart  0-15 Units Subcutaneous TID WC  . insulin aspart  0-5 Units Subcutaneous QHS  . multivitamin  5 mL Per Tube Daily  . omega-3 acid ethyl esters  1 g Oral BID  . pantoprazole  40 mg Oral QHS  .  raloxifene  60 mg Oral q morning - 10a  . rosuvastatin  10 mg Oral q morning - 10a  . senna  1 tablet Oral QHS  . sodium chloride  10-40 mL Intracatheter Q12H  . vancomycin  500 mg Intravenous Q8H   Continuous Infusions: . sodium chloride 10 mL/hr at 04/18/15 0700  . norepinephrine (LEVOPHED) Adult infusion Stopped (04/17/15 0830)   PRN Meds:.fentaNYL (SUBLIMAZE) injection, guaiFENesin-dextromethorphan, levalbuterol, ondansetron (ZOFRAN) IV, potassium chloride, simethicone, sodium chloride, traMADol  General appearance: alert, cooperative and no distress Heart: regular rate and rhythm and no rub Lungs: mildly dim in the bases Abdomen: soft Extremities: + LE edema Wound: incis healing well  Lab Results: CBC: Recent Labs  04/17/15 0510 04/18/15 0550  WBC 10.2 20.7*  HGB 11.2* 11.0*  HCT 33.3* 32.4*  PLT 219 200   BMET:  Recent Labs  04/17/15 0510 04/18/15 0550  NA 128* 132*  K 2.7* 4.1  CL 97* 103  CO2 21* 21*  GLUCOSE 278* 170*  BUN <5* 7  CREATININE 0.89 0.78  CALCIUM 7.2* 7.4*    PT/INR:  Recent Labs  04/11/2015 1150  LABPROT 18.7*  INR  1.56*   Radiology: Dg Chest Port 1 View  04/18/2015   CLINICAL DATA:  Chest tube in place. Subxiphoid window for cardiac tamponade.  EXAM: PORTABLE CHEST 1 VIEW  COMPARISON:  Yesterday  FINDINGS: Tracheal and esophageal extubation with symmetric inflation. Stable left IJ central line with tip at the SVC level. Stable central basilar and left chest drains. No definitive or enlarging pneumothorax. Mild haziness of the lower chest, likely subsegmental atelectasis. Mild increasing cardiopericardial size.  IMPRESSION: 1. Mild increase in cardiopericardial size which may be related to lower lung volumes after extubation. Subxiphoid drain has stable position. 2. Symmetric inflation after extubation. 3. No definitive pneumothorax.   Electronically Signed   By: Monte Fantasia M.D.   On: 04/18/2015 06:25     Assessment/Plan: S/P  Procedure(s) (LRB): SUBXYPHOID PERICARDIAL WINDOW (N/A) CHEST TUBE INSERTION (Left) TRANSESOPHAGEAL ECHOCARDIOGRAM (TEE) (N/A)  1 stable  2 chest tube/pericardial drain > 1065ml yesterday but not delineated per each system. Appears decreasing today, only 164 cc so far- poss d/c tubes soon 3 path for pericardial fluid/tissue pending   [name] [name],[name] [name] 04/18/2015 1:27 PM  CXR looks good but chest tubes still with significant output- will leave in place patient examined and medical record reviewed,agree with above note. Pathology report on pericardial tissue pending Cytology analysis of pericardial fluid and pleural fluid pending [name] 04/18/2015

## 2015-04-18 NOTE — Progress Notes (Signed)
Patient admitted to 3s10 from 2heart. VSS. Chest tube and pericardial tube to 20cm suction. Safety discussed, bed in low position with call bell in reach. Bed alarm set. Will continue to monitor.

## 2015-04-18 NOTE — Progress Notes (Signed)
[name] [name] is now extubated. She sounds and looks pretty good.  The pleural fluid cytology is negative for malignancy. I am awaiting the results from her pericardial fluid and the pericardial biopsy. Hopefully, they will be out today. I think if these are negative, then she will probably will need to have a biopsy of this right lung nodule. I would still have to worry about an underlying malignancy causing all of her issues right now.  Her blood counts are holding pretty steady. Her white cell count is 20.7. Hemoglobin 11 and platelet count is 200,000. I think the elevated white cell count could be a reflection of inflammation. With her spleen out, she will have an exaggerated leukocytosis. She is on antibiotics which I agree with.  I did speak with her son on the phone yesterday. I gave him an update as to what is going on from my point of view. He understands this.  Her potassium is doing much better.  She still has some chest tubes in. Hopefully, they will be removed today.  On her physical exam, her vital signs all are pretty stable. Blood pressure 109/45. Her temperature is 97.9. Her lungs show some slight decrease at the bases. Cardiac exam regular rate and rhythm. She has a systolic murmur/rub that is probably 1/6. Abdomen soft. Bowel sounds are slightly decreased. Extremities shows no clubbing, cyanosis or edema.  Again, we will have to await the results from the paracardial window biopsy in the pericardial fluid cytology.  I appreciate all the great care that she is getting from everybody in the CCU!!  [name] 33:6

## 2015-04-18 NOTE — Progress Notes (Signed)
Name: [name] [name] MRN: [id number] DOB: August 15, 1934    ADMISSION DATE:  04/10/2015 CONSULTATION DATE:  04/15/15  REFERRING MD :  [place] Pink  CHIEF COMPLAINT:  Cough  BRIEF PATIENT DESCRIPTION: 80 y.o. F with remote hx breast CA s/p mastectomy (1981), with possible mets and recent hospitalization September 2016 where she had splenectomy.  She was admitted 9/25 with HCAP and bilateral pleural effusions, s/p thora 9/26 by PCCM.  Echo revealed increase in size of pericardial effusion with findings c/w tamponade.  Transferred to [facility] for evaluation by CVTS in AM 9/27 for pericardial window.  SIGNIFICANT EVENTS  9/25 - admit. 9/26 - echo revealed pericardial effusion with tamponade.  Transferred to [facility].  To be evaluated by CVTS in AM 9/27 for pericardial window. 9/27 - to OR for pericardial window (Dr Darcey Nora)  STUDIES: CT chest 9/26 >>> new moderate b/l pleural effusions, compressive atelectasis in lower lobes.  Enlarging moderate pericardial effusion.  Enlarging RLL 2.3cm nodule compared to 1.8cm previously. Stable bilateral adrenal masses.  Echo 9/26 >>> EF 60-65%, no RWMA's. Compared to prior study on 04/02/15, there is severe circumferential pericardial effusion with signs of hemodynamic compromise c/w tamponade. R thora 9/26 >> cytology negative Pericardial window 9/27 >>     HISTORY OF PRESENT ILLNESS:  [name] [name] is a 80 y.o. F with PMH as outlined below including remote breast CA s/p mastectomy (1981).  She was admitted in August 2016 and was found to have possible mets to lung and adrenals.  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  MRI of brain was negative and CT biopsy of the adrenals was also negative.  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 She was discharged to  SNF with recommendations to follow up with Dr. Marin Olp as an outpatient.  On 9/25 at the SNF, pt had fever, cough, and congestion.  She was subsequently taken to [facility] ED where she was admitted for HCAP.  She was also found to have pleural effusions for which PCCM was consulted 9/26.  She had a thoracentesis where 731mL of rusty colored fluid was obtained and sent for analysis with routine labs and cytology.  Echo done 9/26 revealed increase in size of known pericardial effusion with evidence of hemodynamic compromise c/w tamponade.  She was subsequently transferred to [facility] and is to be evaluated in AM 9/27 by CVTS for pericardial window.  SUBJECTIVE:   Extubated successfully R pleural fluid cytology negative  VITAL SIGNS: Temp:  [97.2 F (36.2 C)-98.1 F (36.7 C)] 97.9 F (36.6 C) (09/29 0400) Pulse Rate:  [53-105] 83 (09/29 0600) Resp:  [14-29] 21 (09/29 0600) BP: (86-109)/(34-83) 109/45 mmHg (09/29 0600) SpO2:  [94 %-100 %] 97 % (09/29 0600) Arterial Line BP: (84-139)/(39-79) 84/68 mmHg (09/29 0600) FiO2 (%):  [30 %] 30 % (09/28 1200) Weight:  [73.2 kg (161 lb 6 oz)] 73.2 kg (161 lb 6 oz) (09/29 0500)  PHYSICAL EXAMINATION: General: Elderly female, chronically ill appearing, comfortable Neuro: A&O x 3, non-focal. Follows commands HEENT: [place]/AT. PERRL, sclerae anicteric. Cardiovascular: RRR, no M/R/G.  Lungs: no wheezes, no crackles Abdomen: BS x 4, soft, NT/ND.  Musculoskeletal: No gross deformities, no edema.  Skin: Intact, warm, no rashes.     Recent Labs Lab 03/27/2015 1022 04/17/15 0510 04/18/15 0550  NA 127* 128* 132*  K 3.3* 2.7* 4.1  CL 90* 97* 103  CO2 27 21* 21*  BUN <5* <5* 7  CREATININE 0.77 0.89 0.78  GLUCOSE 71 278* 170*    Recent Labs Lab 04/15/2015 1915 04/17/15 0510 04/18/15 0550  HGB 9.0* 11.2* 11.0*  HCT 26.7* 33.3* 32.4*  WBC 6.3 10.2 20.7*  PLT 218 219 200   Dg Chest Port 1 View  04/18/2015   CLINICAL DATA:  Chest tube in place. Subxiphoid  window for cardiac tamponade.  EXAM: PORTABLE CHEST 1 VIEW  COMPARISON:  Yesterday  FINDINGS: Tracheal and esophageal extubation with symmetric inflation. Stable left IJ central line with tip at the SVC level. Stable central basilar and left chest drains. No definitive or enlarging pneumothorax. Mild haziness of the lower chest, likely subsegmental atelectasis. Mild increasing cardiopericardial size.  IMPRESSION: 1. Mild increase in cardiopericardial size which may be related to lower lung volumes after extubation. Subxiphoid drain has stable position. 2. Symmetric inflation after extubation. 3. No definitive pneumothorax.   Electronically Signed   By: Monte Fantasia M.D.   On: 04/18/2015 06:25   Dg Chest Port 1 View  04/17/2015   CLINICAL DATA:  Pneumothorax.  Left-sided chest tubes.  EXAM: PORTABLE CHEST 1 VIEW  COMPARISON:  03/28/2015  FINDINGS: Support devices are stable. Heart is normal size. No confluent airspace opacities or effusions.  IMPRESSION: Stable support devices.  No acute findings.   Electronically Signed   By: Rolm Baptise M.D.   On: 04/17/2015 07:24   Dg Chest Port 1 View  04/15/2015   CLINICAL DATA:  Hypoxia.  Pericardial window creation.  EXAM: PORTABLE CHEST 1 VIEW  COMPARISON:  April 15, 2015  FINDINGS: Endotracheal tube tip is 4.6 cm above the carina. Central catheter tip is in the superior vena cava. There is a mediastinal drain as well as a left chest tube. There is no demonstrable pneumomediastinum or pneumopericardium. No pneumothorax identified.  There is a small left pleural effusion. Lungs elsewhere clear. Heart size and pulmonary vascularity are normal. There are surgical clips in left axillary region. No adenopathy.  IMPRESSION: Tube and catheter positions as described without pneumothorax. Small left effusion. Lungs elsewhere clear. Cardiac silhouette within normal limits.   Electronically Signed   By: Lowella Grip III M.D.   On: 04/14/2015 17:15   Dg Abd Portable  1v  04/19/2015   CLINICAL DATA:  Orogastric tube placement.  EXAM: PORTABLE ABDOMEN - 1 VIEW  COMPARISON:  Chest radiograph earlier this day.  FINDINGS: Tip and side port of the enteric tube below the diaphragm in the stomach. A mediastinal drain is in place. Midline skin staples are noted in the abdomen. No dilated bowel loops are seen.  IMPRESSION: Tip and side port of the enteric tube below the diaphragm in the stomach.   Electronically Signed   By: Jeb Levering M.D.   On: 04/09/2015 21:25    ASSESSMENT / PLAN:  Pericardial effusion - echo showed increase in size since prior study, hemodynamic compromise c/w tamponade. S/p pericardial window on 9/27; pericardial and L pleural drains in place; fluid was clear Hx dCHF, HLD, CAD. Plan: Await cytology data pericardial and chest tubes to suction  HCAP. Acute hypoxic respiratory failure - due to above and post-operatively Bilateral pleural effusions - s/p thoracentesis 9/26 R with 749mL of rusty colored fluid obtained. Concerning for malignant effusion, but cytology negative Right pulmonary nodule - concerning for mets. Plan: Extubated am 9/28 Continue empiric abx, vanco + ceftaz (9/29 day 5 of 7) Follow  cultures. BD's as ordered. Recommend RLL nodule needle biopsy if the pericardial fluid cytology is also negative  Being followed by Dr. Marin Olp of oncology.  Hyponatremia, low serum and urine osm Hypokalemia  Pseodohypocalcemia  Plan: Correct electrolytes as indicated. 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 Plan: Ensure pt receives pneumococcal and H.flu vaccines   MGUS. Plan: Being followed by Dr. Marin Olp of oncology.  GLOBAL:  Ok to transfer to a SDU bed, mobilize.    Baltazar Apo, MD, PhD 04/18/2015, 8:36 AM [facility] [phone] or if no answer [phone]

## 2015-04-18 NOTE — Progress Notes (Addendum)
SLP Cancellation Note  Patient Details Name: [name] [name] MRN: [id number] DOB: 10/18/1934   Cancelled treatment:       Reason Eval/Treat Not Completed: Other (comment) Pt starting to work with PT upon SLP arrival, and had just finished her breakfast tray (regular textures, thin liquids). No difficulty with PO intake noted by RN, and no complaints from pt - MD, please advise if swallow evaluation is still warranted.  Received verbal clarification from Dr. Lamonte Sakai that BSE is no longer needed - SLP will sign off.   Germain Osgood, M.A. CCC-SLP [phone]  Germain Osgood 04/18/2015, 9:06 AM

## 2015-04-19 DIAGNOSIS — N184 Chronic kidney disease, stage 4 (severe): Secondary | ICD-10-CM

## 2015-04-19 LAB — GLUCOSE, CAPILLARY
GLUCOSE-CAPILLARY: 151 mg/dL — AB (ref 65–99)
GLUCOSE-CAPILLARY: 139 mg/dL — AB (ref 65–99)
Glucose-Capillary: 112 mg/dL — ABNORMAL HIGH (ref 65–99)

## 2015-04-19 LAB — BASIC METABOLIC PANEL
Anion gap: 9 (ref 5–15)
BUN: 11 mg/dL (ref 6–20)
CHLORIDE: 103 mmol/L (ref 101–111)
CO2: 22 mmol/L (ref 22–32)
Calcium: 8 mg/dL — ABNORMAL LOW (ref 8.9–10.3)
Creatinine, Ser: 1.07 mg/dL — ABNORMAL HIGH (ref 0.44–1.00)
GFR calc Af Amer: 56 mL/min — ABNORMAL LOW (ref >60)
GFR calc non Af Amer: 48 mL/min — ABNORMAL LOW (ref >60)
GLUCOSE: 111 mg/dL — AB (ref 65–99)
POTASSIUM: 3.9 mmol/L (ref 3.5–5.1)
Sodium: 134 mmol/L — ABNORMAL LOW (ref 135–145)

## 2015-04-19 LAB — CULTURE, BLOOD (ROUTINE X 2)
Culture: NO GROWTH
Culture: NO GROWTH

## 2015-04-19 LAB — VANCOMYCIN, TROUGH: VANCOMYCIN TR: 23 ug/mL — AB (ref 10.0–20.0)

## 2015-04-19 MED ORDER — ENSURE ENLIVE PO LIQD
237.0000 mL | Freq: Two times a day (BID) | ORAL | Status: DC
  Administered 2015-04-19 – 2015-04-20 (×2): 237 mL via ORAL

## 2015-04-19 MED ORDER — DEXTROSE 5 % IV SOLN
2.0000 g | Freq: Two times a day (BID) | INTRAVENOUS | Status: DC
  Administered 2015-04-19 – 2015-04-21 (×3): 2 g via INTRAVENOUS
  Filled 2015-04-19 (×5): qty 2

## 2015-04-19 MED ORDER — VANCOMYCIN HCL IN DEXTROSE 1-5 GM/200ML-% IV SOLN
1000.0000 mg | INTRAVENOUS | Status: DC
  Administered 2015-04-20 – 2015-04-26 (×7): 1000 mg via INTRAVENOUS
  Filled 2015-04-19 (×8): qty 200

## 2015-04-19 NOTE — Progress Notes (Signed)
Patient has been transferred from [facility] to 3S 10.  Written and oral handoff will be provided to receiving unit CSW.  Resident of [facility] with plan to return to SNF when medically stable.  Facility will accept back when stable. CSW services will continue to follow and provide assistance and support as needed.  Lorie Phenix. Pauline Good, [place]

## 2015-04-19 NOTE — Progress Notes (Signed)
ANTIBIOTIC CONSULT NOTE - INITIAL  Pharmacy Consult for Vancomycin, Ceftazidime Indication: HCAP  No Known Allergies  Patient Measurements: Height: 5' 8.5" (174 cm) Weight: 162 lb 4.1 oz (73.6 kg) IBW/kg (Calculated) : 65.05  Vital Signs: Temp: 97.5 F (36.4 C) (09/30 2100) Temp Source: Oral (09/30 2100) BP: 152/93 mmHg (09/30 2100) Pulse Rate: 99 (09/30 2100) Intake/Output from previous day: 09/29 0701 - 09/30 0700 In: 1150 [P.O.:360; I.V.:340; IV Piggyback:450] Out: 598 [Urine:360; Chest Tube:238] Intake/Output from this shift: Total I/O In: 104.2 [I.V.:104.2] Out: 200 [Urine:200]  Labs:  Recent Labs  04/17/15 0510 04/18/15 0550 04/19/15 0500  WBC 10.2 20.7*  --   HGB 11.2* 11.0*  --   PLT 219 200  --   CREATININE 0.89 0.78 1.07*   Estimated Creatinine Clearance: 44.5 mL/min (by C-G formula based on Cr of 1.07).  Recent Labs  04/17/15 1358 04/19/15 2100  VANCOTROUGH 6* 23*    Assessment: 80 y.o. female on D#6/7 vancomycin and ceftazidime for HCAP and r/o sepsis. Vancomycin trough = 23 supratherapeutic, drawn 1 hr late. Scr 0.78 > 1.07, est. crcl ~ 40-45 ml/min.  9/25 >> vancomycin >> 9/25 >> ceftazidime >>  9/25 blood: ngtd 9/25 urine: ngtd 9/25 sputum: few gram neg rods, few streptococcus 9/26 fungal stain: ngtd (will exam for 6 wks) 9/27 pleural fluid: neg for malignancy 9/27 pericardium fluid: pending 9/27 pericardial tissue: pending  Goal of Therapy:  Vancomycin trough level 15-20 mcg/ml  Eradication of infection Appropriate antibiotic dosing for indication and renal function  Plan:  - Change vancomycin to 1g Q 24 hrs, next dose tomorrow at 1000 - Change Ceftazidime to 2 g IV q12 hrs - f/u antibiotics stop date. - monitor renal function closely - Follow clinical course, renal function, culture results as available  Maryanna Shape, PharmD, BCPS  Clinical Pharmacist  Pager: [phone]   04/19/2015 10:14 PM

## 2015-04-19 NOTE — Progress Notes (Addendum)
TCTS DAILY ICU PROGRESS NOTE                   [address]            [place] [address]          [phone]   3 Days Post-Op Procedure(s) (LRB): SUBXYPHOID PERICARDIAL WINDOW (N/A) CHEST TUBE INSERTION (Left) TRANSESOPHAGEAL ECHOCARDIOGRAM (TEE) (N/A)  Total Length of Stay:  LOS: 5 days   Subjective:  Path still pending , drainage decreasing. Breathing is more comfortable  Objective: Vital signs in last 24 hours: Temp:  [97.5 F (36.4 C)-97.9 F (36.6 C)] 97.9 F (36.6 C) (09/30 1100) Pulse Rate:  [88-98] 98 (09/30 1100) Cardiac Rhythm:  [-] Normal sinus rhythm (09/30 1100) Resp:  [20-29] 28 (09/30 1100) BP: (134-166)/(63-94) 154/94 mmHg (09/30 1100) SpO2:  [93 %-98 %] 98 % (09/30 1100) Weight:  [162 lb 4.1 oz (73.6 kg)] 162 lb 4.1 oz (73.6 kg) (09/30 0500)  Filed Weights   04/17/15 0500 04/18/15 0500 04/19/15 0500  Weight: 153 lb 3.5 oz (69.5 kg) 161 lb 6 oz (73.2 kg) 162 lb 4.1 oz (73.6 kg)    Weight change: 14.1 oz (0.4 kg)   Hemodynamic parameters for last 24 hours: CVP:  [4 mmHg-8 mmHg] 4 mmHg  Intake/Output from previous day: 09/29 0701 - 09/30 0700 In: 1150 [P.O.:360; I.V.:340; IV Piggyback:450] Out: 569 [Urine:360; Chest Tube:238] 402 from chest tube Intake/Output this shift: Total I/O In: 350 [P.O.:100; I.V.:150; IV Piggyback:100] Out: 230 [Urine:150; Chest Tube:80] Chest tube 402 recorded yesterday, 80 cc so far today   Current Meds: Scheduled Meds: . acetaminophen  1,000 mg Oral 4 times per day   Or  . acetaminophen (TYLENOL) oral liquid 160 mg/5 mL  1,000 mg Oral 4 times per day  . antiseptic oral rinse  7 mL Mouth Rinse BID  . bisacodyl  10 mg Oral Daily  . cefTAZidime (FORTAZ)  IV  2 g Intravenous Q12H  . escitalopram  5 mg Oral q morning - 10a  . feeding supplement (ENSURE ENLIVE)  237 mL Oral BID BM  . folic acid  2 mg Oral Daily  . insulin aspart  0-15 Units Subcutaneous TID WC  . insulin aspart  0-5 Units  Subcutaneous QHS  . multivitamin  5 mL Per Tube Daily  . omega-3 acid ethyl esters  1 g Oral BID  . pantoprazole  40 mg Oral QHS  . raloxifene  60 mg Oral q morning - 10a  . rosuvastatin  10 mg Oral q morning - 10a  . senna  1 tablet Oral QHS  . sodium chloride  10-40 mL Intracatheter Q12H   Continuous Infusions: . sodium chloride 50 mL/hr at 04/19/15 1045   PRN Meds:.fentaNYL (SUBLIMAZE) injection, guaiFENesin-dextromethorphan, levalbuterol, ondansetron (ZOFRAN) IV, potassium chloride, simethicone, sodium chloride, traMADol  General appearance: alert, cooperative and no distress Heart: regular rate and rhythm Lungs: clear to auscultation bilaterally Extremities: + LE edema Wound: incis healing well  Lab Results: CBC: Recent Labs  04/17/15 0510 04/18/15 0550  WBC 10.2 20.7*  HGB 11.2* 11.0*  HCT 33.3* 32.4*  PLT 219 200   BMET:  Recent Labs  04/18/15 0550 04/19/15 0500  NA 132* 134*  K 4.1 3.9  CL 103 103  CO2 21* 22  GLUCOSE 170* 111*  BUN 7 11  CREATININE 0.78 1.07*  CALCIUM 7.4* 8.0*    PT/INR: No results for input(s): LABPROT, INR in the last 72 hours.  Radiology: No results found.   Assessment/Plan: S/P Procedure(s) (LRB): SUBXYPHOID PERICARDIAL WINDOW (N/A) CHEST TUBE INSERTION (Left) TRANSESOPHAGEAL ECHOCARDIOGRAM (TEE) (N/A)  1 stable and improving , path pending 2 appears to be minimal drainage from pericardial tube, will d/c    [name],[name] [name] 04/19/2015 2:38 PM  patient examined and medical record reviewed,agree with above note. [name] 04/19/2015

## 2015-04-19 NOTE — Progress Notes (Signed)
Name: [name] [name] MRN: [id number] DOB: Aug 27, 1934    ADMISSION DATE:  03/28/2015 CONSULTATION DATE:  04/15/15  REFERRING MD :  Maryland Pink  CHIEF COMPLAINT:  Cough  BRIEF PATIENT DESCRIPTION: 80 y.o. F with remote hx breast CA s/p mastectomy (1981), with possible mets and recent hospitalization September 2016 where she had splenectomy.  She was admitted 9/25 with HCAP and bilateral pleural effusions, s/p thora 9/26 by PCCM.  Echo revealed increase in size of pericardial effusion with findings c/w tamponade.  Transferred to [facility] for evaluation by CVTS in AM 9/27 for pericardial window.  SIGNIFICANT EVENTS  9/25 - admit. 9/26 - echo revealed pericardial effusion with tamponade.  Transferred to [facility]  To be evaluated by CVTS in AM 9/27 for pericardial window. 9/27 - to OR for pericardial window (Dr Darcey Nora)  STUDIES: CT chest 9/26 >>> new moderate b/l pleural effusions, compressive atelectasis in lower lobes.  Enlarging moderate pericardial effusion.  Enlarging RLL 2.3cm nodule compared to 1.8cm previously. Stable bilateral adrenal masses.  Echo 9/26 >>> EF 60-65%, no RWMA's. Compared to prior study on 04/02/15, there is severe circumferential pericardial effusion with signs of hemodynamic compromise c/w tamponade. R thora 9/26 >> cytology negative Pericardial window 9/27 >>    SUBJECTIVE:   No acute events  VITAL SIGNS: Temp:  [97.5 F (36.4 C)-97.8 F (36.6 C)] 97.8 F (36.6 C) (09/30 0738) Pulse Rate:  [88-96] 88 (09/30 0738) Resp:  [20-33] 20 (09/30 0738) BP: (133-166)/(42-77) 138/70 mmHg (09/30 0738) SpO2:  [93 %-96 %] 93 % (09/30 0738) Weight:  [73.6 kg (162 lb 4.1 oz)] 73.6 kg (162 lb 4.1 oz) (09/30 0500)  PHYSICAL EXAMINATION:  Gen: comfortable in bed HEENT: NCAT ETT in place PULM: CTA B CV: RRR distant GI: BS+, soft Derm: no rash Neuro: awake and alert, orientedx4    Recent Labs Lab 04/17/15 0510 04/18/15 0550 04/19/15 0500  NA 128* 132* 134*  K 2.7* 4.1 3.9  CL  97* 103 103  CO2 21* 21* 22  BUN <5* 7 11  CREATININE 0.89 0.78 1.07*  GLUCOSE 278* 170* 111*    Recent Labs Lab 03/29/2015 1915 04/17/15 0510 04/18/15 0550  HGB 9.0* 11.2* 11.0*  HCT 26.7* 33.3* 32.4*  WBC 6.3 10.2 20.7*  PLT 218 219 200   Dg Chest Port 1 View  04/18/2015   CLINICAL DATA:  Chest tube in place. Subxiphoid window for cardiac tamponade.  EXAM: PORTABLE CHEST 1 VIEW  COMPARISON:  Yesterday  FINDINGS: Tracheal and esophageal extubation with symmetric inflation. Stable left IJ central line with tip at the SVC level. Stable central basilar and left chest drains. No definitive or enlarging pneumothorax. Mild haziness of the lower chest, likely subsegmental atelectasis. Mild increasing cardiopericardial size.  IMPRESSION: 1. Mild increase in cardiopericardial size which may be related to lower lung volumes after extubation. Subxiphoid drain has stable position. 2. Symmetric inflation after extubation. 3. No definitive pneumothorax.   Electronically Signed   By: Monte Fantasia M.D.   On: 04/18/2015 06:25    ASSESSMENT / PLAN:  Pericardial effusion - echo showed increase in size since prior study, hemodynamic compromise c/w tamponade. S/p pericardial window on 9/27; pericardial and L pleural drains in place; fluid was clear Hx dCHF, HLD, CAD. Plan: Await cytology data pericardial and chest tubes to suction, per TCTS  HCAP. Acute hypoxic respiratory failure - resolved Bilateral pleural effusions - s/p thoracentesis 9/26 R with 725mL of rusty colored fluid obtained. Concerning for malignant effusion, but cytology negative  Right pulmonary nodule - concerning for mets. Plan: Continue empiric abx, vanco + ceftaz (9/30 day 6 of 7) Follow cultures BD's as ordered Recommend RLL nodule needle biopsy if the pericardial fluid cytology is also negative  Being followed by Dr. 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 Plan: Being followed by Dr. Marin Olp of oncology  GLOBAL:  OK to mobilize, PT following   Roselie Awkward, MD [place] [facility]M Pager: [phone] Cell: [phone] After 3pm or if no response, call [phone]

## 2015-04-19 NOTE — Anesthesia Postprocedure Evaluation (Signed)
  Anesthesia Post-op Note  Patient: [name] [name]  Procedure(s) Performed: Procedure(s): SUBXYPHOID PERICARDIAL WINDOW (N/A) CHEST TUBE INSERTION (Left) TRANSESOPHAGEAL ECHOCARDIOGRAM (TEE) (N/A)  Patient Location: PACU  Anesthesia Type:General  Level of Consciousness: sedated  Airway and Oxygen Therapy: Patient re-intubated  Post-op Pain: none  Post-op Assessment: Post-op Vital signs reviewed, Patient's Cardiovascular Status Stable and Respiratory Function Stable LLE Motor Response: Purposeful movement, Responds to commands   RLE Motor Response: Purposeful movement, Responds to commands        Post-op Vital Signs: Reviewed and stable  Last Vitals:  Filed Vitals:   04/19/15 0738  BP:   Pulse:   Temp: 36.6 C  Resp:     Complications: Pt needed to be re-intubated in the PACU.  She was initially extubated in the OR, but subsequently became lethargic and taking shallow breaths.  Sats were near 88%.  Reintubated without problem.  vital signs stable.

## 2015-04-19 NOTE — Progress Notes (Signed)
PT Cancellation Note  Patient Details Name: [name] [name] MRN: [id number] DOB: [birth year]   Cancelled Treatment:    Reason Eval/Treat Not Completed: Other (comment) (pt just returned to chair from walking with nursing and fatigued at this time. Will attempt at later date)   Melford Aase 04/19/2015, 11:13 AM Elwyn Reach, [place]

## 2015-04-19 NOTE — Progress Notes (Signed)
Ms. Beaston is a pretty good. She is moved out of the cardiac care unit.  We still do not have any pathology back on the pericardial fluid or pericardial biopsy. Hopefully, that will be back today.  She is on some antibiotics. She has a little bit of a cough.  Hopefully, she will getting some physical therapy. I think this is critical for her to be able to get better and be able to go home more quickly.  She's not complaining of any pain.  There's been no bleeding.  She's had no obvious change in bowel or bladder habits.  There has been no leg swelling.  Her CBC is not yet back. However, yesterday her blood counts were holding pretty steady.  On her physical exam, her temperature is 97.6. Pulse 96. Blood pressure 166/77. Her head and neck exam shows no adenopathy in the neck area and she has no oral lesions or thrush. Her lungs show some slight decreased bilaterally. Cardiac exam shows a regular rate and rhythm. She does have a rub. Abdomen is soft. Bowel sounds are present. There is no distention. She has the laparoscopy wounds that are healed. Extremities shows no clubbing, cyanosis or edema.  We will have to await the results of the pericardial biopsy and the pericardial fluid. If these are negative, then I think that the next step would have to be try to biopsy this right lung nodule. I would think that this could be obtained as the nodule is pretty peripheral.  Again, I very much appreciate the outstanding care that she is getting from everybody in the stepdown unit!!  [name].  1 Timothy 2:5

## 2015-04-19 NOTE — Progress Notes (Signed)
Nutrition Follow-up  DOCUMENTATION CODES:   Severe malnutrition in context of acute illness/injury  INTERVENTION:    Ensure Enlive PO BID, each supplement provides 350 kcal and 20 grams of protein  NUTRITION DIAGNOSIS:   Malnutrition related to acute illness as evidenced by moderate depletions of muscle mass, energy intake < or equal to 50% for > or equal to 5 days.  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 She was admitted on 9/25 for increased fever, cough and congestion coming from her skilled nursing facility and admitted for sepsis secondary to pneumonia causing acute respiratory failure.  Patient reports that she is eating pretty good, but she gets tired when she is eating. Consuming ~50% of meals. She likes strawberry Ensure and agreed to drink it between meals.   Diet Order:  Diet regular Room service appropriate?: Yes; Fluid consistency:: Thin  Skin:  Wound (see comment) (DTI to L heel; stage II pressure ulcer to buttocks)  Last BM:  9/25  Height:   Ht Readings from Last 1 Encounters:  04/15/15 5' 8.5" (1.74 m)    Weight:   Wt Readings from Last 1 Encounters:  04/19/15 162 lb 4.1 oz (73.6 kg)    Ideal Body Weight:  63.64 kg  BMI:  Body mass index is 24.31 kg/(m^2).  Estimated Nutritional Needs:   Kcal:  1400-1600  Protein:  80-90g  Fluid:  1.6L/day  EDUCATION NEEDS:   No education needs identified at  this time  Molli Barrows, [place], [place], [place] Pager [phone] After Hours Pager [phone]

## 2015-04-20 ENCOUNTER — Inpatient Hospital Stay (HOSPITAL_COMMUNITY): Payer: Medicare Other

## 2015-04-20 DIAGNOSIS — R531 Weakness: Secondary | ICD-10-CM

## 2015-04-20 DIAGNOSIS — I313 Pericardial effusion (noninflammatory): Secondary | ICD-10-CM

## 2015-04-20 LAB — BASIC METABOLIC PANEL
ANION GAP: 6 (ref 5–15)
BUN: 13 mg/dL (ref 6–20)
CALCIUM: 8 mg/dL — AB (ref 8.9–10.3)
CO2: 25 mmol/L (ref 22–32)
CREATININE: 0.97 mg/dL (ref 0.44–1.00)
Chloride: 106 mmol/L (ref 101–111)
GFR, EST NON AFRICAN AMERICAN: 55 mL/min — AB (ref >60)
Glucose, Bld: 86 mg/dL (ref 65–99)
Potassium: 3.7 mmol/L (ref 3.5–5.1)
SODIUM: 137 mmol/L (ref 135–145)

## 2015-04-20 LAB — TYPE AND SCREEN
ABO/RH(D): B POS
Antibody Screen: NEGATIVE
Unit division: 0
Unit division: 0

## 2015-04-20 LAB — CULTURE, BODY FLUID W GRAM STAIN -BOTTLE: Culture: NO GROWTH

## 2015-04-20 LAB — GLUCOSE, CAPILLARY
GLUCOSE-CAPILLARY: 103 mg/dL — AB (ref 65–99)
GLUCOSE-CAPILLARY: 81 mg/dL (ref 65–99)
GLUCOSE-CAPILLARY: 72 mg/dL (ref 65–99)
Glucose-Capillary: 107 mg/dL — ABNORMAL HIGH (ref 65–99)
Glucose-Capillary: 75 mg/dL (ref 65–99)

## 2015-04-20 LAB — TISSUE CULTURE: Culture: NO GROWTH

## 2015-04-20 LAB — CULTURE, BODY FLUID-BOTTLE: CULTURE: NO GROWTH

## 2015-04-20 MED ORDER — BENZONATATE 100 MG PO CAPS
200.0000 mg | ORAL_CAPSULE | Freq: Three times a day (TID) | ORAL | Status: DC | PRN
  Administered 2015-04-20: 200 mg via ORAL
  Filled 2015-04-20 (×3): qty 2

## 2015-04-20 NOTE — Progress Notes (Signed)
  Echocardiogram 2D Echocardiogram has been performed.  Lysle Rubens 04/20/2015, 11:36 AM

## 2015-04-20 NOTE — Progress Notes (Signed)
Name: [name] [name] MRN: [id number] DOB: [birth year]    ADMISSION DATE:  03/21/2015 CONSULTATION DATE:  04/15/15  REFERRING MD :  Mary[place] Pink  CHIEF COMPLAINT:  Cough  BRIEF PATIENT DESCRIPTION: 80 y.o. F with remote hx breast CA s/p mastectomy (1981), with possible mets and recent hospitalization September 2016 where she had splenectomy.  She was admitted 9/25 with HCAP and bilateral pleural effusions, s/p thora 9/26 by PCCM.  Echo revealed increase in size of pericardial effusion with findings c/w tamponade.  Transferred to [facility] for evaluation by CVTS in AM 9/27 for pericardial window.  SIGNIFICANT EVENTS  9/25 - admit. 9/26 - echo revealed pericardial effusion with tamponade.  Transferred to [facility].  To be evaluated by CVTS in AM 9/27 for pericardial window. 9/27 - to OR for pericardial window (Dr Darcey Nora)  STUDIES: CT chest 9/26 >>> new moderate b/l pleural effusions, compressive atelectasis in lower lobes.  Enlarging moderate pericardial effusion.  Enlarging RLL 2.3cm nodule compared to 1.8cm previously. Stable bilateral adrenal masses.  Echo 9/26 >>> EF 60-65%, no RWMA's. Compared to prior study on 04/02/15, there is severe circumferential pericardial effusion with signs of hemodynamic compromise c/w tamponade. R thora 9/26 >> cytology negative Pericardial window 9/27 >>    SUBJECTIVE:   Return of cough, progressive, productive clear mucus  VITAL SIGNS: Temp:  [97.5 F (36.4 C)-97.8 F (36.6 C)] 97.8 F (36.6 C) (09/30 0738) Pulse Rate:  [88-96] 88 (09/30 0738) Resp:  [20-33] 20 (09/30 0738) BP: (133-166)/(42-77) 138/70 mmHg (09/30 0738) SpO2:  [93 %-96 %] 93 % (09/30 0738) Weight:  [73.6 kg (162 lb 4.1 oz)] 73.6 kg (162 lb 4.1 oz) (09/30 0500)  PHYSICAL EXAMINATION:  Gen: comfortable in bed HEENT: NCAT ETT in place PULM: CTA B, harsh cough, conversational CV: RRR distant GI: BS+, soft Derm: no rash Neuro: awake and alert, orientedx4    Recent Labs Lab 04/17/15 0510  04/18/15 0550 04/19/15 0500  NA 128* 132* 134*  K 2.7* 4.1 3.9  CL 97* 103 103  CO2 21* 21* 22  BUN <5* 7 11  CREATININE 0.89 0.78 1.07*  GLUCOSE 278* 170* 111*    Recent Labs Lab 04/15/2015 1915 04/17/15 0510 04/18/15 0550  HGB 9.0* 11.2* 11.0*  HCT 26.7* 33.3* 32.4*  WBC 6.3 10.2 20.7*  PLT 218 219 200   Dg Chest Port 1 View  04/18/2015   CLINICAL DATA:  Chest tube in place. Subxiphoid window for cardiac tamponade.  EXAM: PORTABLE CHEST 1 VIEW  COMPARISON:  Yesterday  FINDINGS: Tracheal and esophageal extubation with symmetric inflation. Stable left IJ central line with tip at the SVC level. Stable central basilar and left chest drains. No definitive or enlarging pneumothorax. Mild haziness of the lower chest, likely subsegmental atelectasis. Mild increasing cardiopericardial size.  IMPRESSION: 1. Mild increase in cardiopericardial size which may be related to lower lung volumes after extubation. Subxiphoid drain has stable position. 2. Symmetric inflation after extubation. 3. No definitive pneumothorax.   Electronically Signed   By: Monte Fantasia M.D.   On: 04/18/2015 06:25   I reviewed CXR from 9/29  ASSESSMENT / PLAN:  Pericardial effusion - echo showed increase in size since prior study, hemodynamic compromise c/w tamponade. S/p pericardial window on 9/27; pericardial and L pleural drains in place; fluid was clear Hx dCHF, HLD, CAD. Plan: Await cytology data>> pericardial and chest tubes to suction, per TCTS  HCAP. Acute hypoxic respiratory failure - resolved Bilateral pleural effusions - s/p thoracentesis 9/26 R with 771mL  of rusty colored fluid obtained. Concerning for malignant effusion, but cytology negative Right pulmonary nodule - concerning for mets. Cough Plan: Continue empiric abx, vanco + ceftaz (9/30 day 6 of 7) Follow cultures BD's as ordered Recommend RLL nodule needle biopsy if the pericardial fluid cytology is also negative  Being followed by Dr.  Marin Olp of oncology Tramadol or benzonatate if needed  Increased Cr 9/30 Hyponatremia, low serum and urine osm Hypokalemia  Pseodohypocalcemia  Plan: Added low dose IVF, monitor fluid status carefully. 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 Plan: Being followed by Dr. Marin Olp of oncology  GLOBAL:  OK to mobilize, PT following   CD Annamaria Boots, MD [place] P[facility]M Pager: [phone] Cell: [phone] After 3pm or if no response, call [phone]

## 2015-04-20 NOTE — Progress Notes (Addendum)
      [address]       [place] [address]             [phone]      4 Days Post-Op Procedure(s) (LRB): SUBXYPHOID PERICARDIAL WINDOW (N/A) CHEST TUBE INSERTION (Left) TRANSESOPHAGEAL ECHOCARDIOGRAM (TEE) (N/A)   Subjective:  [name] [name] states she has been coughing most of the night.  It is worse if she lays down.  Objective: Vital signs in last 24 hours: Temp:  [97.5 F (36.4 C)-98.2 F (36.8 C)] 98.2 F (36.8 C) (10/01 0715) Pulse Rate:  [84-100] 85 (10/01 0715) Cardiac Rhythm:  [-] Normal sinus rhythm (10/01 0715) Resp:  [15-28] 28 (10/01 0715) BP: (134-166)/(61-94) 166/90 mmHg (10/01 0715) SpO2:  [90 %-98 %] 90 % (10/01 0715)  Hemodynamic parameters for last 24 hours: CVP:  [4 mmHg-9 mmHg] 4 mmHg  Intake/Output from previous day: 09/30 0701 - 10/01 0700 In: 1951.7 [P.O.:640; I.V.:1161.7; IV Piggyback:150] Out: 8242 [Urine:1400; Chest Tube:172] Intake/Output this shift: Total I/O In: 100 [I.V.:100] Out: 550 [Urine:450; Chest Tube:100]  General appearance: alert, cooperative and no distress Heart: regular rate and rhythm Lungs: clear to auscultation bilaterally Abdomen: soft, non-tender; bowel sounds normal; no masses,  no organomegaly Wound: clean and dry  Lab Results:  Recent Labs  04/18/15 0550  WBC 20.7*  HGB 11.0*  HCT 32.4*  PLT 200   BMET:  Recent Labs  04/19/15 0500 04/20/15 0604  NA 134* 137  K 3.9 3.7  CL 103 106  CO2 22 25  GLUCOSE 111* 86  BUN 11 13  CREATININE 1.07* 0.97  CALCIUM 8.0* 8.0*    PT/INR: No results for input(s): LABPROT, INR in the last 72 hours. ABG    Component Value Date/Time   PHART 7.376 04/17/2015 0505   HCO3 20.1 04/17/2015 0505   TCO2 21 04/17/2015 0505   ACIDBASEDEF 4.0* 04/17/2015 0505   O2SAT 99.0 04/17/2015 0505   CBG (last 3)   Recent Labs  04/19/15 1528 04/19/15 2150 04/20/15 0717  GLUCAP 139* 103* 81    Assessment/Plan: S/P Procedure(s) (LRB): SUBXYPHOID  PERICARDIAL WINDOW (N/A) CHEST TUBE INSERTION (Left) TRANSESOPHAGEAL ECHOCARDIOGRAM (TEE) (N/A)  1. Pericardial drain removed yesterday 2. Pulm- wean oxygen as tolerated, Tessalon ordered for cough, chest tube with 100 cc out since 7Am- will leave in place for now 3. Dispo- care per primary   LOS: 6 days    [name], [name] 04/20/2015  I have seen and examined the patient and agree with the assessment and plan as outlined.  Rexene Alberts, MD 04/20/2015 11:02 AM

## 2015-04-20 DEATH — deceased

## 2015-04-21 ENCOUNTER — Inpatient Hospital Stay (HOSPITAL_COMMUNITY): Payer: Medicare Other

## 2015-04-21 DIAGNOSIS — J9600 Acute respiratory failure, unspecified whether with hypoxia or hypercapnia: Secondary | ICD-10-CM

## 2015-04-21 DIAGNOSIS — A419 Sepsis, unspecified organism: Secondary | ICD-10-CM | POA: Insufficient documentation

## 2015-04-21 DIAGNOSIS — J0100 Acute maxillary sinusitis, unspecified: Secondary | ICD-10-CM

## 2015-04-21 DIAGNOSIS — R4182 Altered mental status, unspecified: Secondary | ICD-10-CM

## 2015-04-21 DIAGNOSIS — J810 Acute pulmonary edema: Secondary | ICD-10-CM

## 2015-04-21 DIAGNOSIS — R6521 Severe sepsis with septic shock: Secondary | ICD-10-CM

## 2015-04-21 DIAGNOSIS — R402434 Glasgow coma scale score 3-8, 24 hours or more after hospital admission: Secondary | ICD-10-CM

## 2015-04-21 LAB — POCT I-STAT 3, ART BLOOD GAS (G3+)
ACID-BASE EXCESS: 1 mmol/L (ref 0.0–2.0)
Acid-Base Excess: 2 mmol/L (ref 0.0–2.0)
BICARBONATE: 26.3 meq/L — AB (ref 20.0–24.0)
BICARBONATE: 22.4 meq/L (ref 20.0–24.0)
O2 SAT: 100 %
O2 Saturation: 97 %
PCO2 ART: 38.5 mmHg (ref 35.0–45.0)
PH ART: 7.513 — AB (ref 7.350–7.450)
PO2 ART: 303 mmHg — AB (ref 80.0–100.0)
PO2 ART: 79 mmHg — AB (ref 80.0–100.0)
Patient temperature: 99.8
Patient temperature: 99
TCO2: 27 mmol/L (ref 0–100)
TCO2: 23 mmol/L (ref 0–100)
pCO2 arterial: 27.9 mmHg — ABNORMAL LOW (ref 35.0–45.0)
pH, Arterial: 7.445 (ref 7.350–7.450)

## 2015-04-21 LAB — POCT I-STAT, CHEM 8
BUN: 14 mg/dL (ref 6–20)
CREATININE: 1 mg/dL (ref 0.44–1.00)
Calcium, Ion: 1.08 mmol/L — ABNORMAL LOW (ref 1.13–1.30)
Chloride: 96 mmol/L — ABNORMAL LOW (ref 101–111)
Glucose, Bld: 126 mg/dL — ABNORMAL HIGH (ref 65–99)
HEMATOCRIT: 40 % (ref 36.0–46.0)
Hemoglobin: 13.6 g/dL (ref 12.0–15.0)
Potassium: 2.7 mmol/L — CL (ref 3.5–5.1)
Sodium: 132 mmol/L — ABNORMAL LOW (ref 135–145)
TCO2: 20 mmol/L (ref 0–100)

## 2015-04-21 LAB — BLOOD GAS, ARTERIAL
ACID-BASE EXCESS: 1.4 mmol/L (ref 0.0–2.0)
Bicarbonate: 24.7 mEq/L — ABNORMAL HIGH (ref 20.0–24.0)
DRAWN BY: 42624
FIO2: 5
O2 Saturation: 96.5 %
PCO2 ART: 33.8 mmHg — AB (ref 35.0–45.0)
PH ART: 7.478 — AB (ref 7.350–7.450)
Patient temperature: 98.6
TCO2: 25.8 mmol/L (ref 0–100)
pO2, Arterial: 83.1 mmHg (ref 80.0–100.0)

## 2015-04-21 LAB — BASIC METABOLIC PANEL
Anion gap: 7 (ref 5–15)
Anion gap: 9 (ref 5–15)
BUN: 9 mg/dL (ref 6–20)
BUN: 10 mg/dL (ref 6–20)
CHLORIDE: 102 mmol/L (ref 101–111)
CHLORIDE: 101 mmol/L (ref 101–111)
CO2: 26 mmol/L (ref 22–32)
CO2: 23 mmol/L (ref 22–32)
Calcium: 7.8 mg/dL — ABNORMAL LOW (ref 8.9–10.3)
Calcium: 7.3 mg/dL — ABNORMAL LOW (ref 8.9–10.3)
Creatinine, Ser: 0.82 mg/dL (ref 0.44–1.00)
Creatinine, Ser: 0.88 mg/dL (ref 0.44–1.00)
GFR calc Af Amer: >60 mL/min (ref >60)
GFR calc Af Amer: >60 mL/min (ref >60)
GFR calc non Af Amer: >60 mL/min (ref >60)
GFR calc non Af Amer: >60 mL/min (ref >60)
GLUCOSE: 98 mg/dL (ref 65–99)
GLUCOSE: 96 mg/dL (ref 65–99)
POTASSIUM: 3.3 mmol/L — AB (ref 3.5–5.1)
POTASSIUM: 3 mmol/L — AB (ref 3.5–5.1)
Sodium: 135 mmol/L (ref 135–145)
Sodium: 133 mmol/L — ABNORMAL LOW (ref 135–145)

## 2015-04-21 LAB — PHOSPHORUS: Phosphorus: 1.4 mg/dL — ABNORMAL LOW (ref 2.5–4.6)

## 2015-04-21 LAB — ANAEROBIC CULTURE: Gram Stain: NONE SEEN

## 2015-04-21 LAB — CULTURE, BODY FLUID-BOTTLE: CULTURE: NO GROWTH

## 2015-04-21 LAB — LACTIC ACID, PLASMA: Lactic Acid, Venous: 2.1 mmol/L (ref 0.5–2.0)

## 2015-04-21 LAB — CBC
HEMATOCRIT: 35.6 % — AB (ref 36.0–46.0)
Hemoglobin: 11.9 g/dL — ABNORMAL LOW (ref 12.0–15.0)
MCH: 30.4 pg (ref 26.0–34.0)
MCHC: 33.4 g/dL (ref 30.0–36.0)
MCV: 90.8 fL (ref 78.0–100.0)
Platelets: 153 10*3/uL (ref 150–400)
RBC: 3.92 MIL/uL (ref 3.87–5.11)
RDW: 15.4 % (ref 11.5–15.5)
WBC: 3.6 10*3/uL — ABNORMAL LOW (ref 4.0–10.5)

## 2015-04-21 LAB — BRAIN NATRIURETIC PEPTIDE: B Natriuretic Peptide: 484.1 pg/mL — ABNORMAL HIGH (ref 0.0–100.0)

## 2015-04-21 LAB — GLUCOSE, CAPILLARY
GLUCOSE-CAPILLARY: 109 mg/dL — AB (ref 65–99)
GLUCOSE-CAPILLARY: 67 mg/dL (ref 65–99)
Glucose-Capillary: 119 mg/dL — ABNORMAL HIGH (ref 65–99)

## 2015-04-21 LAB — CORTISOL: Cortisol, Plasma: 0.6 ug/dL

## 2015-04-21 LAB — PROCALCITONIN: Procalcitonin: 0.7 ng/mL

## 2015-04-21 LAB — MAGNESIUM: Magnesium: 1.4 mg/dL — ABNORMAL LOW (ref 1.7–2.4)

## 2015-04-21 LAB — AMMONIA: AMMONIA: 39 umol/L — AB (ref 9–35)

## 2015-04-21 LAB — CULTURE, BODY FLUID W GRAM STAIN -BOTTLE

## 2015-04-21 MED ORDER — SODIUM CHLORIDE 0.9 % IV BOLUS (SEPSIS)
1000.0000 mL | Freq: Once | INTRAVENOUS | Status: AC
  Administered 2015-04-21: 1000 mL via INTRAVENOUS

## 2015-04-21 MED ORDER — SODIUM CHLORIDE 0.9 % IV SOLN
500.0000 mg | Freq: Three times a day (TID) | INTRAVENOUS | Status: DC
  Administered 2015-04-21 – 2015-04-26 (×15): 500 mg via INTRAVENOUS
  Filled 2015-04-21 (×17): qty 500

## 2015-04-21 MED ORDER — DEXTROSE 50 % IV SOLN
INTRAVENOUS | Status: AC
  Administered 2015-04-21: 50 mL
  Filled 2015-04-21: qty 50

## 2015-04-21 MED ORDER — POTASSIUM PHOSPHATES 15 MMOLE/5ML IV SOLN
40.0000 meq | Freq: Once | INTRAVENOUS | Status: AC
  Administered 2015-04-21: 40 meq via INTRAVENOUS
  Filled 2015-04-21: qty 9.09

## 2015-04-21 MED ORDER — ANTISEPTIC ORAL RINSE SOLUTION (CORINZ)
7.0000 mL | OROMUCOSAL | Status: DC
  Administered 2015-04-21 – 2015-04-25 (×35): 7 mL via OROMUCOSAL

## 2015-04-21 MED ORDER — ACETAMINOPHEN 650 MG RE SUPP
650.0000 mg | Freq: Four times a day (QID) | RECTAL | Status: DC | PRN
  Administered 2015-04-21: 650 mg via RECTAL
  Filled 2015-04-21 (×2): qty 1

## 2015-04-21 MED ORDER — CHLORHEXIDINE GLUCONATE 0.12% ORAL RINSE (MEDLINE KIT)
15.0000 mL | Freq: Two times a day (BID) | OROMUCOSAL | Status: DC
  Administered 2015-04-21 – 2015-04-26 (×10): 15 mL via OROMUCOSAL

## 2015-04-21 MED ORDER — MIDAZOLAM HCL 2 MG/2ML IJ SOLN
INTRAMUSCULAR | Status: AC
  Administered 2015-04-21: 2 mg
  Filled 2015-04-21: qty 2

## 2015-04-21 MED ORDER — MAGNESIUM SULFATE 2 GM/50ML IV SOLN
2.0000 g | Freq: Once | INTRAVENOUS | Status: AC
  Administered 2015-04-21: 2 g via INTRAVENOUS
  Filled 2015-04-21: qty 50

## 2015-04-21 MED ORDER — ACETAMINOPHEN 160 MG/5ML PO SOLN
650.0000 mg | Freq: Four times a day (QID) | ORAL | Status: DC | PRN
  Administered 2015-04-26 – 2015-04-27 (×3): 650 mg via ORAL
  Filled 2015-04-21 (×3): qty 20.3

## 2015-04-21 MED ORDER — PANTOPRAZOLE SODIUM 40 MG IV SOLR
40.0000 mg | INTRAVENOUS | Status: DC
  Administered 2015-04-21 – 2015-04-29 (×9): 40 mg via INTRAVENOUS
  Filled 2015-04-21 (×10): qty 40

## 2015-04-21 MED ORDER — POTASSIUM CHLORIDE 10 MEQ/50ML IV SOLN
10.0000 meq | INTRAVENOUS | Status: DC
Start: 2015-04-21 — End: 2015-04-21
  Administered 2015-04-21: 10 meq via INTRAVENOUS

## 2015-04-21 MED ORDER — FENTANYL CITRATE (PF) 100 MCG/2ML IJ SOLN
50.0000 ug | INTRAMUSCULAR | Status: DC | PRN
  Administered 2015-04-21 – 2015-04-22 (×4): 50 ug via INTRAVENOUS
  Filled 2015-04-21 (×3): qty 2

## 2015-04-21 MED ORDER — SODIUM CHLORIDE 0.9 % IV SOLN
1.0000 g | Freq: Once | INTRAVENOUS | Status: AC
  Administered 2015-04-21: 1 g via INTRAVENOUS
  Filled 2015-04-21: qty 10

## 2015-04-21 MED ORDER — SODIUM CHLORIDE 0.9 % IV SOLN
INTRAVENOUS | Status: DC | PRN
  Administered 2015-04-21: 10 mL/h via INTRA_ARTERIAL

## 2015-04-21 MED ORDER — FOLIC ACID 5 MG/ML IJ SOLN
2.0000 mg | Freq: Every day | INTRAMUSCULAR | Status: DC
  Administered 2015-04-21 – 2015-04-28 (×8): 2 mg via INTRAVENOUS
  Filled 2015-04-21 (×9): qty 0.4

## 2015-04-21 MED ORDER — POTASSIUM CHLORIDE 10 MEQ/50ML IV SOLN
INTRAVENOUS | Status: AC
  Filled 2015-04-21: qty 50

## 2015-04-21 MED ORDER — FUROSEMIDE 10 MG/ML IJ SOLN
20.0000 mg | INTRAMUSCULAR | Status: DC
  Administered 2015-04-21: 20 mg via INTRAVENOUS
  Filled 2015-04-21: qty 2

## 2015-04-21 MED ORDER — POTASSIUM CHLORIDE 10 MEQ/50ML IV SOLN
10.0000 meq | INTRAVENOUS | Status: AC
  Administered 2015-04-21 – 2015-04-22 (×4): 10 meq via INTRAVENOUS
  Filled 2015-04-21 (×4): qty 50

## 2015-04-21 MED ORDER — SODIUM CHLORIDE 0.9 % IV SOLN
0.0400 [IU]/min | INTRAVENOUS | Status: DC
  Administered 2015-04-21: 0.03 [IU]/min via INTRAVENOUS
  Administered 2015-04-22 – 2015-04-23 (×2): 0.04 [IU]/min via INTRAVENOUS
  Filled 2015-04-21 (×3): qty 2

## 2015-04-21 MED ORDER — POTASSIUM CHLORIDE 10 MEQ/50ML IV SOLN: 10.0000 meq | INTRAVENOUS | Status: DC

## 2015-04-21 MED ORDER — ETOMIDATE 2 MG/ML IV SOLN
10.0000 mg | Freq: Once | INTRAVENOUS | Status: AC
  Administered 2015-04-21: 10 mg via INTRAVENOUS

## 2015-04-21 MED ORDER — NOREPINEPHRINE BITARTRATE 1 MG/ML IV SOLN
2.0000 ug/min | INTRAVENOUS | Status: DC
  Administered 2015-04-21: 32 ug/min via INTRAVENOUS
  Filled 2015-04-21: qty 4

## 2015-04-21 MED ORDER — NOREPINEPHRINE BITARTRATE 1 MG/ML IV SOLN
2.0000 ug/min | INTRAVENOUS | Status: DC
  Administered 2015-04-21: 5 ug/min via INTRAVENOUS
  Filled 2015-04-21: qty 4

## 2015-04-21 MED ORDER — NOREPINEPHRINE BITARTRATE 1 MG/ML IV SOLN
2.0000 ug/min | INTRAVENOUS | Status: DC
  Administered 2015-04-21: 40 ug/min via INTRAVENOUS
  Administered 2015-04-22: 25 ug/min via INTRAVENOUS
  Administered 2015-04-22: 40 ug/min via INTRAVENOUS
  Administered 2015-04-23: 70 ug/min via INTRAVENOUS
  Filled 2015-04-21 (×5): qty 16

## 2015-04-21 NOTE — Progress Notes (Signed)
[place] Progress Note Patient Name: [name] [name] DOB: 06-14-1935 MRN: [id number]   Date of Service  04/21/2015  HPI/Events of Note  CVP 1 , levo max  eICU Interventions  NS bolus until CVP 10, add vaso Hypokalemia -repleted Hypocalcemia- repleted     Intervention Category Major Interventions: Shock - evaluation and management  ALVA,RAKESH V. 04/21/2015, 9:26 PM

## 2015-04-21 NOTE — Progress Notes (Signed)
Name: [name] [name] MRN: [id number] DOB: 1934-10-27    ADMISSION DATE:  03/30/2015 CONSULTATION DATE:  04/15/15  REFERRING MD :  Mary[place] Pink  CHIEF COMPLAINT:  Cough  BRIEF PATIENT DESCRIPTION: 80 y.o. F with remote hx breast CA s/p mastectomy (1981), with possible mets and recent hospitalization September 2016 where she had splenectomy.  She was admitted 9/25 with HCAP and bilateral pleural effusions, s/p thora 9/26 by PCCM.  Echo revealed increase in size of pericardial effusion with findings c/w tamponade.  Transferred to [facility] for evaluation by CVTS in AM 9/27 for pericardial window.  SIGNIFICANT EVENTS  9/25 - admit. 9/26 - echo revealed pericardial effusion with tamponade.  Transferred to [facility]  To be evaluated by CVTS in AM 9/27 for pericardial window. 9/27 - to OR for pericardial window (Dr Darcey Nora) 10/1-  Altered mental status 10/2- Neuro consult  STUDIES: CT chest 9/26 >>> new moderate b/l pleural effusions, compressive atelectasis in lower lobes.  Enlarging moderate pericardial effusion.  Enlarging RLL 2.3cm nodule compared to 1.8cm previously. Stable bilateral adrenal masses.  Echo 9/26 >>> EF 60-65%, no RWMA's. Compared to prior study on 04/02/15, there is severe circumferential pericardial effusion with signs of hemodynamic compromise c/w tamponade. R thora 9/26 >> cytology negative Pericardial window 9/27 >>  CT HNK w/o contrast- no acute brain. New sinusitis  SUBJECTIVE:  Patient nonverbal. Nurse reports patient found in chair before midnight last night, incontinent and not responsive. ELink notified and ordered labs and CT head. Neuro unchanged over night. No documented hypoglycemia or hypoxic event.  VITAL SIGNS: Temp:  [98.5 F (36.9 C)-100.6 F (38.1 C)] 99.1 F (37.3 C) (10/02 0600) Pulse Rate:  [98-110] 110 (10/02 0600) Resp:  [22-35] 31 (10/02 0600) BP: (139-176)/(61-128) 139/64 mmHg (10/02 0600) SpO2:  [90 %-100 %] 99 % (10/02 0600) Weight:  [73.029 kg (161 lb)] 73.029  kg (161 lb) (10/02 0337)  PHYSICAL EXAMINATION: Gen: Lying quietly in bed HEENT: No stridor, L IJ CVP PULM: Harsh cough, bilateral rhonchi, sat 97% on [place] CV: RRR distant GI: BS+, soft Derm: no rash Neuro: Alerts a little to manual stim. Pupils equal, mid-position, poorly responsive. Yawns. Not moving extrems.    Recent Labs Lab 04/19/15 0500 04/20/15 0604 04/21/15 0130  NA 134* 137 135  K 3.9 3.7 3.3*  CL 103 106 102  CO2 22 25 26   BUN 11 13 9   CREATININE 1.07* 0.97 0.82  GLUCOSE 111* 86 98    Recent Labs Lab 04/17/15 0510 04/18/15 0550 04/21/15 0130  HGB 11.2* 11.0* 11.9*  HCT 33.3* 32.4* 35.6*  WBC 10.2 20.7* 3.6*  PLT 219 200 153   Ct Head Wo Contrast  04/21/2015   CLINICAL DATA:  Altered mental status. Difficult to arouse. Admitted for respiratory failure  EXAM: CT HEAD WITHOUT CONTRAST  TECHNIQUE: Contiguous axial images were obtained from the base of the skull through the vertex without intravenous contrast.  COMPARISON:  03/30/2015  FINDINGS: Skull and Sinuses:Mucosal thickening and bilateral maxillary sinus fluid levels have developed since prior study, possibly related to interval intubation.  No acute fracture destructive process.  Orbits: Negative.  Brain: No evidence of acute infarction, hemorrhage, hydrocephalus, or mass lesion/mass effect. Cerebral volume loss, expected for age.  IMPRESSION: 1. Stable, negative intracranial imaging. 2. Sinusitis with fluid levels has developed since prior, possibly from interval intubation.   Electronically Signed   By: Monte Fantasia M.D.   On: 04/21/2015 01:58   Dg Chest Port 1 View  04/21/2015  CLINICAL DATA:  Pericardial effusion  EXAM: PORTABLE CHEST 1 VIEW  COMPARISON:  04/18/2015  FINDINGS: Left chest tube remains in place. No pneumothorax. Left jugular central venous catheter tip in the SVC.  Interval development of bilateral airspace disease right greater than left. Interval development of right pleural effusion.  Probable congestive heart failure with edema. Pericardial drain has been removed.  Progression of bibasilar atelectasis right greater than left.  IMPRESSION: Pericardial drain removed. No pneumothorax. Left chest tube remains in place.  Interval development of bilateral airspace disease and right pleural effusion most consistent with pulmonary edema.   Electronically Signed   By: Franchot Gallo M.D.   On: 04/21/2015 08:37   I reviewed CXR and CT head  ASSESSMENT / PLAN:  Pericardial effusion - echo showed increase in size since prior study, hemodynamic compromise c/w tamponade. S/p pericardial window on 9/27; pericardial and L pleural drains in place; fluid was clear Hx dCHF, HLD, CAD. Plan: Await cytology data pericardial and chest tubes to suction, per TCTS  HCAP. Acute infilts and fever 10/2 - aspiration vs pulm edema Acute hypoxic respiratory failure - resolved Bilateral pleural effusions - s/p thoracentesis 9/26 R with 716mL of rusty colored fluid obtained. Concerning for malignant effusion, but cytology negative Right pulmonary nodule - concerning for mets. Plan: Continues empiric abx, vanco + ceftaz (10/2 day 9) Follow cultures BD's as ordered Recommend RLL nodule needle biopsy if the pericardial fluid cytology is also negative  Being followed by Dr. Marin Olp of oncology  Altered Mental Status- noted late 10/1. CT head neg for obvious brain event. No hypoxic or hypoglycemic event noted.  Plan:  family/ daughter notified  Neurology consulted-Dr Leonel Ramsay      Pulmonary Edema- CXR favors edema. Could be massive aspiration pneumonia but oxygenation better than expected for that.    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 Plan: Being followed by Dr. Marin Olp of oncology    CD Annamaria Boots, MD [place] P[facility]M Pager: [phone] Cell: [phone] After 3pm or if no response, call [phone]

## 2015-04-21 NOTE — Procedures (Signed)
Intubation Procedure Note [name] [name] [id number] 09/30/1934  Procedure: Intubation Indications: Respiratory insufficiency  Procedure Details Consent: Risks of procedure as well as the alternatives and risks of each were explained to the (patient/caregiver).  Consent for procedure obtained. Time Out: Verified patient identification, verified procedure, site/side was marked, verified correct patient position, special equipment/implants available, medications/allergies/relevent history reviewed, required imaging and test results available.  Performed  Maximum sterile technique was used including gown and hand hygiene.  MAC and 3  Given 2 mg versed, 50 mcg fentanyl, 10 mg etomidate.  Inserted 7.5 ETT to 23 cm at lip using glidescope.  Evaluation Hemodynamic Status: Persistent hypotension treated with pressors; O2 sats: stable throughout Patient's Current Condition: unstable Complications: No apparent complications Patient did tolerate procedure well. Chest X-ray ordered to verify placement.  CXR: pending.   [name] Mires, MD [facility] Pulmonary/Critical Care 04/21/2015, 11:50 AM Pager:  [phone] After 3pm call: [phone]

## 2015-04-21 NOTE — Progress Notes (Signed)
eLink Physician-Brief Progress Note Patient Name: [name] [name] DOB: 12-28-1934 MRN: [id number]   Date of Service  04/21/2015  HPI/Events of Note  CT scan reviewed- No acute process. Labs reviewed- Low K, Mg, Phos. Camera check- Pt is sleeping, comfortable, no distress  eICU Interventions  Replete lytes. Will continue to monitor     Intervention Category Intermediate Interventions: Change in mental status - evaluation and management  Zacari Stiff 04/21/2015, 3:22 AM

## 2015-04-21 NOTE — Consult Note (Addendum)
Neurology Consultation Reason for Consult: Altered Mental Status Referring Physician: Annamaria Boots, C  CC: Altered Mental Status  History is obtained from: Chart review  HPI: [name] [name] is a 80 y.o. female who presented with malignant pericardial tamponade undergoing pericardial windowing on 09/27. Even prior to surgery, there are some notes about lethargy that seemed out of proportion to her severity of tamponade. She did have some period of time where she was hypotensive on the 27th with BPs in the 60s documented. She has also been hyponatremic. Following surgery, she was recovering.   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 MRI at that time was negative.   She has been febrile throughout this hospitalization    ROS:  Unable to obtain due to altered mental status.   Past Medical History  Diagnosis Date  . Abnormal WBC count     low, h/o  . Other and unspecified hyperlipidemia   . Unspecified essential hypertension   . OP (osteoporosis)   . Vitamin D deficiency   . MGUS (monoclonal gammopathy of unknown significance) 03/16/2013  . Back pain   . Breast cancer 1981    mastectomy and lymph node removal  . DM type 2 (diabetes mellitus, type 2)     Pt states she is 'prediabetic'     Family History  Problem Relation Age of Onset  . Liver cancer Father   . Diabetes Father      Social History:  reports that she quit smoking about 48 years ago. Her smoking use included Cigarettes. She started smoking about 58 years ago. She has a 10 pack-year smoking history. She has never used smokeless tobacco. She reports that she does not drink alcohol or use illicit drugs.   Exam: Current vital signs: BP 128/68 mmHg  Pulse 113  Temp(Src) 102.8 F (39.3 C) (Axillary)  Resp 60  Ht 5' 8.5" (1.74 m)  Wt 73.029 kg (161 lb)  BMI 24.12 kg/m2  SpO2  96% Vital signs in last 24 hours: Temp:  [98.5 F (36.9 C)-102.8 F (39.3 C)] 102.8 F (39.3 C) (10/02 0800) Pulse Rate:  [98-113] 113 (10/02 0900) Resp:  [22-60] 60 (10/02 0900) BP: (125-176)/(51-128) 128/68 mmHg (10/02 0900) SpO2:  [90 %-100 %] 96 % (10/02 0900) Weight:  [73.029 kg (161 lb)] 73.029 kg (161 lb) (10/02 0337)  Physical Exam  Constitutional: Appears elderly Psych: does nto respond Eyes: No scleral injection HENT: neck supple Head: Normocephalic.  Cardiovascular: Normal rate and regular rhythm.  Respiratory: rhonchorus breathing GI: Soft.  No distension. There is no tenderness.  Skin: WDI  Neuro: Mental Status: Patient opens eyes to voice. She does not follow commands. She does not fixate or engage examiner.  Cranial Nerves: II: blinks to threat bilaterally. Pupils are equal, round, and reactive to light.   III,IV, VI: Eyes are midline, she will not look to either side to command.  V: Facial sensation is symmetric to temperature VII: Facial movement appears VIII: hearing is intact to voice X: Uvula elevates symmetrically XI: Shoulder shrug is symmetric. XII: tongue is midline without atrophy or fasciculations.  Motor: She moves bilateral arms voluntarily and quasi-purposefully. She withdraws bilateral legs to noxious stimuli.  Sensory: Withdraws x 4.  Cerebellar: Unable to assess secondary to patient's altered mental status.     I have reviewed labs in epic and the results pertinent to this consultation are: TSH nml Bmp -  unremarkable.   I have reviewed the images obtained:CT head- no acute findings  Impression: 80 yo F with altered mental status in the setting of recent persistent encephalopathy, possible aspiration, recent tamponade. Possibilities include multifactorial delirium, embolic shower, adrenal insufficiency(given bilateral adrenal masses), progression of a paraneoplastic encephalopathy. I do not see any BPs low enough to make me think  something like a post-hypoxic demyelination would be likely but in any case this would be apparent on MRI. With supple neck and preceding prodrome, I do not think that CNS infection is likely.   Recommendations: 1) MRI brain 2) cortisol, ammonia 3) EEG 4) will continue to follow.    Roland Rack, MD [facility] [phone]  If 7pm- 7am, please page neurology on call as listed in [place].

## 2015-04-21 NOTE — Progress Notes (Addendum)
eLink Physician-Brief Progress Note Patient Name: [name] [name] DOB: 26-Feb-1935 MRN: [id number]   Date of Service  04/21/2015  HPI/Events of Note  Pt is has altered mental status after being transferred to bed from chair. Not responsive on camera check  eICU Interventions  Ordered CBC, BMP, ABG, CXR and CT head.     Intervention Category Major Interventions: Change in mental status - evaluation and management  [name] 04/21/2015, 1:05 AM

## 2015-04-21 NOTE — Procedures (Signed)
Arterial Catheter Insertion Procedure Note [name] [name] [id number] 1934-12-04  Procedure: Insertion of Arterial Catheter  Indications: Blood pressure monitoring  Procedure Details Consent: Unable to obtain consent because of altered level of consciousness. Time Out: Verified patient identification, verified procedure, site/side was marked, verified correct patient position, special equipment/implants available, medications/allergies/relevent history reviewed, required imaging and test results available.  Performed  Maximum sterile technique was used including antiseptics, cap, gloves, gown, hand hygiene, mask and sheet. Skin prep: Chlorhexidine; local anesthetic administered 20 gauge catheter was inserted into right radial artery using the Seldinger technique.  Evaluation Blood flow good; BP tracing good. Complications: No apparent complications.   Revonda Standard 04/21/2015

## 2015-04-21 NOTE — Progress Notes (Signed)
Upon getting pt ready for MRI, Dr. Halford Chessman came in and said to transfer pt to ICU for intubation and not go to MRI. Pt transferred to 2S15. Family aware.

## 2015-04-21 NOTE — Progress Notes (Signed)
CRITICAL VALUE ALERT  Critical value received:  Lactic Acid 2.1  Date of notification:  04/21/2015  Time of notification:  5852  Critical value read back:Yes.    Nurse who received alert:  Achille Rich, RN  MD notified (1st page):  Called eICU- Dr. Alva Garnet  Time of first page:  1607  Responding MD:  Alva Garnet  Time MD responded:  [place]  [place]

## 2015-04-21 NOTE — Progress Notes (Signed)
Notified family (daughter [name]) of pt change.

## 2015-04-21 NOTE — Progress Notes (Signed)
Notified Dr. Annamaria Boots of pt assessment change since yesterday. States he will come see pt. Will continue to monitor pt.

## 2015-04-21 NOTE — Progress Notes (Signed)
RT note-weaned fio2, no other changes with ventilator since intubation, continue to monitor.

## 2015-04-21 NOTE — Progress Notes (Addendum)
      [address]       [place] [address]             [phone]      5 Days Post-Op Procedure(s) (LRB): SUBXYPHOID PERICARDIAL WINDOW (N/A) CHEST TUBE INSERTION (Left) TRANSESOPHAGEAL ECHOCARDIOGRAM (TEE) (N/A)   Subjective:  [name] [name] is mostly non-responsive this morning.  Nursing states last night they found her in the chair around midnight, incontinent and non-responsive.  CT of head was obtained and did not show evidence of acute intracranial process.  She responds to her name, but not verbal commands  Objective: Vital signs in last 24 hours: Temp:  [98.5 F (36.9 C)-102.8 F (39.3 C)] 102.8 F (39.3 C) (10/02 0800) Pulse Rate:  [98-113] 113 (10/02 0900) Cardiac Rhythm:  [-] Sinus tachycardia (10/02 0800) Resp:  [22-60] 60 (10/02 0900) BP: (125-176)/(51-128) 128/68 mmHg (10/02 0900) SpO2:  [90 %-100 %] 96 % (10/02 0900) Weight:  [161 lb (73.029 kg)] 161 lb (73.029 kg) (10/02 0337)  Hemodynamic parameters for last 24 hours: CVP:  [0 mmHg-9 mmHg] 1 mmHg  Intake/Output from previous day: 10/01 0701 - 10/02 0700 In: 990 [P.O.:240; I.V.:500; IV Piggyback:250] Out: 1115 [Urine:900; Chest Tube:215] Intake/Output this shift: Total I/O In: 100 [I.V.:100] Out: 10 [Chest Tube:10]  General appearance: responds to name, otherwise unresponsive Heart: regular rate and rhythm Lungs: coarse, rhonchi bilaterally Abdomen: soft, non-tender; bowel sounds normal; no masses,  no organomegaly Wound: clean and dry  Lab Results:  Recent Labs  04/21/15 0130  WBC 3.6*  HGB 11.9*  HCT 35.6*  PLT 153   BMET:  Recent Labs  04/20/15 0604 04/21/15 0130  NA 137 135  K 3.7 3.3*  CL 106 102  CO2 25 26  GLUCOSE 86 98  BUN 13 9  CREATININE 0.97 0.82  CALCIUM 8.0* 7.8*    PT/INR: No results for input(s): LABPROT, INR in the last 72 hours. ABG    Component Value Date/Time   PHART 7.478* 04/21/2015 0225   HCO3 24.7* 04/21/2015 0225   TCO2 25.8 04/21/2015  0225   ACIDBASEDEF 4.0* 04/17/2015 0505   O2SAT 96.5 04/21/2015 0225   CBG (last 3)   Recent Labs  04/21/15 04/21/15 0027 04/21/15 0821  GLUCAP 67 109* 119*    Assessment/Plan: S/P Procedure(s) (LRB): SUBXYPHOID PERICARDIAL WINDOW (N/A) CHEST TUBE INSERTION (Left) TRANSESOPHAGEAL ECHOCARDIOGRAM (TEE) (N/A)  1. Altered Mental Status- CT scan negative 2. Chest tube- output continues to be relatively high, continue CT for now 3. Dispo- care per pulmonary   LOS: 7 days    [name], [name] 04/21/2015  I have seen and examined the patient and agree with the assessment and plan as outlined.  Patient developed high fevers, productive cough, altered mental status and increased respiratory distress overnight.  Agree w/ management outlined by Dr. Halford Chessman and Pulm/CCM team.  Keep pleural tube in place for now.  Rexene Alberts, MD 04/21/2015 12:35 PM

## 2015-04-21 NOTE — Progress Notes (Signed)
RT note- patient arrived in ICU, requiring intubation, Dr. Halford Chessman at bedisde, [place] placed per order. ABG obtained and fio2 decreased post results.

## 2015-04-21 NOTE — Progress Notes (Signed)
Notified Dr. Alva Garnet of patients temp now 102.5 and MAPs high 50-low 60s with patient on 52mcg Levophed. Tylenol suppository to be given and Dr. Alva Garnet ordered MAP goal of 60.  [name] [name]

## 2015-04-21 NOTE — Progress Notes (Signed)
ANTIBIOTIC CONSULT NOTE  Pharmacy Consult for Vancomycin, Ceftazidime Indication: sepsis  No Known Allergies  Patient Measurements: Height: 5' 8.5" (174 cm) Weight: 161 lb (73.029 kg) IBW/kg (Calculated) : 65.05  Vital Signs: Temp: 102.8 F (39.3 C) (10/02 0800) Temp Source: Axillary (10/02 0800) BP: 128/68 mmHg (10/02 0900) Pulse Rate: 113 (10/02 0900) Intake/Output from previous day: 10/01 0701 - 10/02 0700 In: 990 [P.O.:240; I.V.:500; IV Piggyback:250] Out: 1115 [Urine:900; Chest Tube:215] Intake/Output from this shift: Total I/O In: 100 [I.V.:100] Out: 10 [Chest Tube:10]  Labs:  Recent Labs  04/19/15 0500 04/20/15 0604 04/21/15 0130  WBC  --   --  3.6*  HGB  --   --  11.9*  PLT  --   --  153  CREATININE 1.07* 0.97 0.82   Estimated Creatinine Clearance: 58.1 mL/min (by C-G formula based on Cr of 0.82).  Recent Labs  04/19/15 2100  VANCOTROUGH 23*    Assessment: 80 y.o. female who was started on abx for HCAP and r/o sepsis. Now with AMS and spiking new fevers. New cultures have been ordered and antibiotics to be changed to vancomycin and imipenem-cilastin.  vancomycin 9/25>> ceftazidime 9/25>>10/2 Imipenem 10/2>>  9/28 1330 VT: 6 (Of note: one dose was missed 9/27) - Increased from 500mg  IV q12 to 500mg  IV q8 9/30 VT = 23, drawn 1 hr late. Changed to 1g IV q24h  9/25 blood: neg 9/25 urine: neg x2 9/26 sputum: few streptococcus, beta hemolytic not group A 9/26 pleural fungal stain: ngtd (will exam for 6 wks) 9/27 pleural fluid: neg for malignancy 9/27 AFB: ngtd 9/27 pericardium fluid: neg 9/27 pericardial tissue: neg 10/2 BCx: 10/2 urine: 10/2 sputum:  Goal of Therapy:  Vancomycin trough level 15-20 mcg/ml  Eradication of infection Appropriate antibiotic dosing for indication and renal function  Plan:  - Continue vancomycin 1g IV q24h - start imipenem-cilastin 500mg  IV q8h - Follow clinical progression, culture results as available, renal  function, trough at new SS  Calinda Stockinger D. Lee-Anne Flicker, PharmD, BCPS Clinical Pharmacist Pager: [phone] 04/21/2015 11:41 AM

## 2015-04-21 NOTE — Progress Notes (Signed)
Patient opens eyes when name called, does not follow  Commands, VS 99.1 oral, 139/64, HR 109, RR 26, sats 98%  CVP range 2-4 positional, CVP being temporarily disconnected second to limited IV access, currently potassium phosphate @ 85 ccc/hr, Magnesium sulfate 2 gr as ordered, NS@ 50 cc/hr infusing through Left DL IJ, SCD's on , O2 @ 2 liters, ST 100-120's, Left CT to -20cm suction, output zero, no leaks noted,CT dressing reinforced D/I, mid sternum dressing D/I, lungs ronchi bilaterally, nonproductive barking like cough, resting with eyes closed, will continue to monitor.

## 2015-04-21 NOTE — Progress Notes (Addendum)
TCTS BRIEF SICU PROGRESS NOTE  5 Days Post-Op  S/P Procedure(s) (LRB): SUBXYPHOID PERICARDIAL WINDOW (N/A) CHEST TUBE INSERTION (Left) TRANSESOPHAGEAL ECHOCARDIOGRAM (TEE) (N/A)   Now sedated and intubated on vent Continues to spike fevers > 102 now on empiric Vanc + Primaxin Sinus tach on levophed for BP support due to septic shock Still making excellent UOP  Plan: Per Pulm/CCM team.  Consider replacing old central line  Rexene Alberts, MD 04/21/2015 6:02 PM

## 2015-04-21 NOTE — Progress Notes (Signed)
PULMONARY / CRITICAL CARE MEDICINE   Name: [name] [name] MRN: [id number] DOB: December 26, 1934    ADMISSION DATE:  04/19/2015  REFERRING MD :  [facility]  CHIEF COMPLAINT:  Cough  INITIAL PRESENTATION:  80 yo admitted with cough and fever from sepsis and HCAP.  She was found to have cardiac tamponade.  She has hx of breast cancer with possible metastatic disease, MGUS.  STUDIES:  9/26 Thoracentesis  9/26 CT chest >> b/l pleural effusions, large pericardial effusion, 2.3 cm RLL nodule (was 1.8 cm) 9/26 Echo >> tamponade  SIGNIFICANT EVENTS: 9/25 Admit, oncology consult 9/26 Cardiology consulted 9/27 TCTS consulted >> subxyphoid pericardial window 9/28 Extubated 10/02 Fever, altered mental status >>To ICU  SUBJECTIVE:  Worsening mental status and respiratory distress.  VITAL SIGNS: Temp:  [98.5 F (36.9 C)-102.8 F (39.3 C)] 102.8 F (39.3 C) (10/02 0800) Pulse Rate:  [98-113] 113 (10/02 0900) Resp:  [22-60] 60 (10/02 0900) BP: (125-176)/(51-128) 128/68 mmHg (10/02 0900) SpO2:  [90 %-100 %] 96 % (10/02 0900) Weight:  [161 lb (73.029 kg)] 161 lb (73.029 kg) (10/02 0337) HEMODYNAMICS: CVP:  [0 mmHg-9 mmHg] 1 mmHg VENTILATOR SETTINGS:   INTAKE / OUTPUT:  Intake/Output Summary (Last 24 hours) at 04/21/15 1102 Last data filed at 04/21/15 0900  Gross per 24 hour  Intake    500 ml  Output    375 ml  Net    125 ml    PHYSICAL EXAMINATION: General: ill appearing Neuro:  somnolent HEENT:  gurgling Cardiovascular:  tachycardic Lungs:  B/l crackles Abdomen:  Soft, non tender Musculoskeletal:  1+ edema Skin:  No rashes  LABS:  CBC  Recent Labs Lab 04/17/15 0510 04/18/15 0550 04/21/15 0130  WBC 10.2 20.7* 3.6*  HGB 11.2* 11.0* 11.9*  HCT 33.3* 32.4* 35.6*  PLT 219 200 153   Coag's  Recent Labs Lab 04/02/2015 2115 04/07/2015 1150  APTT 24 35  INR 1.44 1.56*   BMET  Recent Labs Lab 04/19/15 0500 04/20/15 0604 04/21/15 0130  NA 134* 137 135  K 3.9 3.7 3.3*  CL  103 106 102  CO2 22 25 26   BUN 11 13 9   CREATININE 1.07* 0.97 0.82  GLUCOSE 111* 86 98   Electrolytes  Recent Labs Lab 03/21/2015 2115  04/02/2015 1022  04/18/15 1638 04/19/15 0500 04/20/15 0604 04/21/15 0130  CALCIUM 7.5*  < > 8.1*  < >  --  8.0* 8.0* 7.8*  MG 1.1*  --  1.0*  --  2.1  --   --  1.4*  PHOS 3.3  --  2.9  --   --   --   --  1.4*  < > = values in this interval not displayed.   Sepsis Markers  Recent Labs Lab 04/19/2015 1348 04/02/2015 1641 04/12/2015 1810 03/22/2015 1942  LATICACIDVEN 1.26  --  1.2 1.4  PROCALCITON  --  0.23  --   --    ABG  Recent Labs Lab 04/06/2015 1631 04/17/15 0505 04/21/15 0225  PHART 7.480* 7.376 7.478*  PCO2ART 32.5* 34.2* 33.8*  PO2ART 145* 146.0* 83.1   Liver Enzymes  Recent Labs Lab 04/16/2015 2115 04/15/15 0515 04/18/15 0550  AST 63* 60* 98*  ALT 21 20 31   ALKPHOS 135* 131* 119  BILITOT 0.6 0.8 0.3  ALBUMIN 2.1* 2.1* 1.5*   Cardiac Enzymes No results for input(s): TROPONINI, PROBNP in the last 168 hours.   Glucose  Recent Labs Lab 04/20/15 1249 04/20/15 1628 04/20/15 2155 04/21/15 04/21/15 0027 04/21/15 7989  GLUCAP 107* 72 75 67 109* 119*    Imaging Ct Head Wo Contrast  04/21/2015   CLINICAL DATA:  Altered mental status. Difficult to arouse. Admitted for respiratory failure  EXAM: CT HEAD WITHOUT CONTRAST  TECHNIQUE: Contiguous axial images were obtained from the base of the skull through the vertex without intravenous contrast.  COMPARISON:  03/30/2015  FINDINGS: Skull and Sinuses:Mucosal thickening and bilateral maxillary sinus fluid levels have developed since prior study, possibly related to interval intubation.  No acute fracture destructive process.  Orbits: Negative.  Brain: No evidence of acute infarction, hemorrhage, hydrocephalus, or mass lesion/mass effect. Cerebral volume loss, expected for age.  IMPRESSION: 1. Stable, negative intracranial imaging. 2. Sinusitis with fluid levels has developed since prior,  possibly from interval intubation.   Electronically Signed   By: Monte Fantasia M.D.   On: 04/21/2015 01:58   Dg Chest Port 1 View  04/21/2015   CLINICAL DATA:  Pericardial effusion  EXAM: PORTABLE CHEST 1 VIEW  COMPARISON:  04/18/2015  FINDINGS: Left chest tube remains in place. No pneumothorax. Left jugular central venous catheter tip in the SVC.  Interval development of bilateral airspace disease right greater than left. Interval development of right pleural effusion. Probable congestive heart failure with edema. Pericardial drain has been removed.  Progression of bibasilar atelectasis right greater than left.  IMPRESSION: Pericardial drain removed. No pneumothorax. Left chest tube remains in place.  Interval development of bilateral airspace disease and right pleural effusion most consistent with pulmonary edema.   Electronically Signed   By: Franchot Gallo M.D.   On: 04/21/2015 08:37     ASSESSMENT / PLAN:  PULMONARY A: Acute hypoxic respiratory failure. Rt lung nodule. Pleural effusions. P:   F/u CXR Monitor need for intubation  CARDIOVASCULAR Lt IJ CVL >>  A:  Pericardial effusion with tamponade s/p window. Hx of diastolic CHF, CAD, HLD. P:  Monitor hemodynamics Check lactic acid Hold crestor  RENAL A:   AKI. Hypokalemia. P:   Monitor renal fx Replace electrolytes as needed  GASTROINTESTINAL A:   Nutrition. P:   NPO  HEMATOLOGIC A:   Hx of splenectomy, MGUS, breast cancer. P:  F/u CBC  INFECTIOUS A:   HCAP. P:   Day 8 Abx >> change to primaxin, continue vancomycin Check procalcitonin  Blood 10/02 >> Urine 10/02 >> Sputum 10/02 >>  ENDOCRINE A:   No acute issues. P:   Monitor CBG on BMET  NEUROLOGIC A:   Acute encephalopathy. P:   Monitor mental status Hold lexapro  CC time 42 minutes.  Chesley Mires, MD [facility] Pulmonary/Critical Care 04/21/2015, 11:13 AM Pager:  [phone] After 3pm call: [phone]

## 2015-04-21 NOTE — Consult Note (Signed)
WOC consulted for sacral ulcer, however upon my arrival this patient's BP is quite soft and in the 70s.  Nurse and I agreed to hold off on my assessment until the am. She will make sure an have patient placed on Sport mattress or LALM today.  Patient was just reintubated as well.   WOC will follow up in the am for assessment of the sacral ulcer. Foam dressing in place for now to protect area.   [place], [place]

## 2015-04-21 NOTE — Progress Notes (Signed)
At 0001 patient was sitting in reliner chair, appeared sleep at first, called pt's name no response, responded to painful stimuli, CBG resulted 67, patient was given D50 per hypoglycemia order set, transferred (x4 staff members)  back bed, repeated CBG @ 1210 resulted 107, patient still not following commands, will open eyes when named called, elink ( Dr. Bernita Raisin) notified see new orders. VS 158/64-102-20, sats 97% on O2@2L , temp 100.2 ax, patient going down CT scan and CXR, ABG  and labs ordered.

## 2015-04-22 ENCOUNTER — Inpatient Hospital Stay (HOSPITAL_COMMUNITY): Payer: Medicare Other

## 2015-04-22 DIAGNOSIS — J9601 Acute respiratory failure with hypoxia: Secondary | ICD-10-CM

## 2015-04-22 DIAGNOSIS — I472 Ventricular tachycardia: Secondary | ICD-10-CM

## 2015-04-22 DIAGNOSIS — I4729 Other ventricular tachycardia: Secondary | ICD-10-CM

## 2015-04-22 LAB — POCT I-STAT 3, ART BLOOD GAS (G3+)
Acid-base deficit: 4 mmol/L — ABNORMAL HIGH (ref 0.0–2.0)
BICARBONATE: 19.9 meq/L — AB (ref 20.0–24.0)
O2 Saturation: 99 %
PCO2 ART: 33.3 mmHg — AB (ref 35.0–45.0)
Patient temperature: 100.8
TCO2: 21 mmol/L (ref 0–100)
pH, Arterial: 7.391 (ref 7.350–7.450)
pO2, Arterial: 122 mmHg — ABNORMAL HIGH (ref 80.0–100.0)

## 2015-04-22 LAB — BASIC METABOLIC PANEL
ANION GAP: 8 (ref 5–15)
Anion gap: 7 (ref 5–15)
BUN: 12 mg/dL (ref 6–20)
BUN: 11 mg/dL (ref 6–20)
CALCIUM: 6.6 mg/dL — AB (ref 8.9–10.3)
CHLORIDE: 99 mmol/L — AB (ref 101–111)
CO2: 20 mmol/L — AB (ref 22–32)
CO2: 19 mmol/L — AB (ref 22–32)
CREATININE: 0.93 mg/dL (ref 0.44–1.00)
Calcium: 6.7 mg/dL — ABNORMAL LOW (ref 8.9–10.3)
Chloride: 105 mmol/L (ref 101–111)
Creatinine, Ser: 0.97 mg/dL (ref 0.44–1.00)
GFR calc non Af Amer: 55 mL/min — ABNORMAL LOW (ref >60)
GFR calc non Af Amer: 57 mL/min — ABNORMAL LOW (ref >60)
Glucose, Bld: 101 mg/dL — ABNORMAL HIGH (ref 65–99)
Glucose, Bld: 133 mg/dL — ABNORMAL HIGH (ref 65–99)
POTASSIUM: 3.3 mmol/L — AB (ref 3.5–5.1)
Potassium: 3.7 mmol/L (ref 3.5–5.1)
SODIUM: 127 mmol/L — AB (ref 135–145)
SODIUM: 131 mmol/L — AB (ref 135–145)

## 2015-04-22 LAB — PROCALCITONIN: PROCALCITONIN: 4.9 ng/mL

## 2015-04-22 LAB — CBC WITH DIFFERENTIAL/PLATELET
Basophils Absolute: 0 10*3/uL (ref 0.0–0.1)
Basophils Relative: 0 %
EOS ABS: 0.1 10*3/uL (ref 0.0–0.7)
Eosinophils Relative: 2 %
HEMATOCRIT: 35.3 % — AB (ref 36.0–46.0)
HEMOGLOBIN: 11.5 g/dL — AB (ref 12.0–15.0)
LYMPHS ABS: 0.2 10*3/uL — AB (ref 0.7–4.0)
LYMPHS PCT: 4 %
MCH: 29.3 pg (ref 26.0–34.0)
MCHC: 32.6 g/dL (ref 30.0–36.0)
MCV: 89.8 fL (ref 78.0–100.0)
MONOS PCT: 3 %
Monocytes Absolute: 0.2 10*3/uL (ref 0.1–1.0)
NEUTROS ABS: 4.1 10*3/uL (ref 1.7–7.7)
NEUTROS PCT: 90 %
Platelets: 49 10*3/uL — ABNORMAL LOW (ref 150–400)
RBC: 3.93 MIL/uL (ref 3.87–5.11)
RDW: 15.5 % (ref 11.5–15.5)
WBC: 4.6 10*3/uL (ref 4.0–10.5)

## 2015-04-22 LAB — URINE CULTURE: Culture: NO GROWTH

## 2015-04-22 LAB — MAGNESIUM
MAGNESIUM: 1.8 mg/dL (ref 1.7–2.4)
MAGNESIUM: 2.6 mg/dL — AB (ref 1.7–2.4)
Magnesium: 1.3 mg/dL — ABNORMAL LOW (ref 1.7–2.4)

## 2015-04-22 LAB — TISSUE CULTURE
Culture: NO GROWTH
Gram Stain: NONE SEEN

## 2015-04-22 LAB — LACTIC ACID, PLASMA: Lactic Acid, Venous: 2 mmol/L (ref 0.5–2.0)

## 2015-04-22 LAB — BLOOD GAS, ARTERIAL
ACID-BASE DEFICIT: 1.7 mmol/L (ref 0.0–2.0)
Bicarbonate: 20.7 mEq/L (ref 20.0–24.0)
DRAWN BY: 437071
FIO2: 0.4
LHR: 20 {breaths}/min
MECHVT: 520 mL
O2 Saturation: 93 %
PEEP/CPAP: 5 cmH2O
PH ART: 7.53 — AB (ref 7.350–7.450)
PO2 ART: 64.8 mmHg — AB (ref 80.0–100.0)
Patient temperature: 98.6
TCO2: 21.5 mmol/L (ref 0–100)
pCO2 arterial: 24.9 mmHg — ABNORMAL LOW (ref 35.0–45.0)

## 2015-04-22 LAB — PHOSPHORUS: PHOSPHORUS: 3.2 mg/dL (ref 2.5–4.6)

## 2015-04-22 MED ORDER — HYDROCORTISONE NA SUCCINATE PF 100 MG IJ SOLR
100.0000 mg | Freq: Two times a day (BID) | INTRAMUSCULAR | Status: DC
  Administered 2015-04-22 – 2015-04-25 (×8): 100 mg via INTRAVENOUS
  Filled 2015-04-22 (×11): qty 2

## 2015-04-22 MED ORDER — FENTANYL CITRATE (PF) 100 MCG/2ML IJ SOLN
50.0000 ug | INTRAMUSCULAR | Status: DC | PRN
Start: 2015-04-22 — End: 2015-04-26
  Administered 2015-04-23: 50 ug via INTRAVENOUS
  Filled 2015-04-22 (×2): qty 2

## 2015-04-22 MED ORDER — POTASSIUM CHLORIDE 10 MEQ/50ML IV SOLN
10.0000 meq | INTRAVENOUS | Status: AC
  Administered 2015-04-22 (×5): 10 meq via INTRAVENOUS
  Filled 2015-04-22 (×5): qty 50

## 2015-04-22 MED ORDER — FENTANYL CITRATE (PF) 100 MCG/2ML IJ SOLN
50.0000 ug | INTRAMUSCULAR | Status: AC | PRN
  Administered 2015-04-23 (×3): 50 ug via INTRAVENOUS
  Filled 2015-04-22 (×2): qty 2

## 2015-04-22 MED ORDER — MIDAZOLAM HCL 2 MG/2ML IJ SOLN
1.0000 mg | INTRAMUSCULAR | Status: DC | PRN
  Administered 2015-04-24: 1 mg via INTRAVENOUS
  Filled 2015-04-22 (×2): qty 2

## 2015-04-22 MED ORDER — SODIUM CHLORIDE 0.9 % IV BOLUS (SEPSIS)
500.0000 mL | Freq: Once | INTRAVENOUS | Status: AC
  Administered 2015-04-22: 500 mL via INTRAVENOUS

## 2015-04-22 MED ORDER — MAGNESIUM SULFATE 4 GM/100ML IV SOLN
4.0000 g | Freq: Once | INTRAVENOUS | Status: AC
  Administered 2015-04-22: 4 g via INTRAVENOUS
  Filled 2015-04-22: qty 100

## 2015-04-22 MED ORDER — MIDAZOLAM HCL 2 MG/2ML IJ SOLN
1.0000 mg | INTRAMUSCULAR | Status: DC | PRN
  Administered 2015-04-23: 1 mg via INTRAVENOUS

## 2015-04-22 MED ORDER — LACTATED RINGERS IV BOLUS (SEPSIS)
2000.0000 mL | Freq: Once | INTRAVENOUS | Status: AC
  Administered 2015-04-22: 2000 mL via INTRAVENOUS

## 2015-04-22 MED ORDER — MAGNESIUM SULFATE 2 GM/50ML IV SOLN
2.0000 g | Freq: Once | INTRAVENOUS | Status: AC
  Administered 2015-04-22: 2 g via INTRAVENOUS
  Filled 2015-04-22: qty 50

## 2015-04-22 MED ORDER — SODIUM CHLORIDE 0.9 % IV BOLUS (SEPSIS): 1000.0000 mL | Freq: Once | INTRAVENOUS | Status: DC

## 2015-04-22 MED ORDER — SODIUM CHLORIDE 0.9 % IV BOLUS (SEPSIS)
1000.0000 mL | Freq: Once | INTRAVENOUS | Status: AC
  Administered 2015-04-22: 1000 mL via INTRAVENOUS

## 2015-04-22 NOTE — Progress Notes (Signed)
EEG completed; results pending.    

## 2015-04-22 NOTE — Progress Notes (Signed)
RT attempted maximum amount of times to replace arterial line that had come out.  Unable to successfully place arterial line.  RN aware and will inform MD for further instructions.

## 2015-04-22 NOTE — Progress Notes (Signed)
I am totally amaze has to was happened over the weekend. When I saw her on Friday, she looked quite good. I really thought that she will be able to go home soon.  Apparently, over the weekend, she decompensated significantly. She has now  been intubated. She is on pressor support. She is on broad-spectrum antibiotics. She looks incredibly mottled. Her blood pressure still on the low side. She had an echocardiogram done. This showed an ejection fraction of 55-60%. Systolic function appeared okay. There is no pericardial fluid.  I'm still awaiting the pathology from the pericardial biopsy and the pericardial fluid. We'll have to call pathology today to see what is going on and why the delay in a diagnosis.  Her CBC shows that her blood counts dropping significantly. On the 28th, her plate count was [phone],503. Now, it is 49,000. Her white cell count on the 29th was 20.7. Now, it is 4.6 area did her hemoglobin is 11.5.  Some form of sepsis I suppose could be causing the thrombocytopenia.  Her BUN and creatinine seem to be doing okay. Her lactic acid is okay at 2.1. Her potassium is 3.3. Sodium is a little bit of the low side.  I probably will get her back on steroids on the outside chance that this might be some kind of adrenal insufficiency.  I'm just very dismayed over the "turn of events" with her. Again she looked quite good on Friday. Everything really seem to be going along well.  As always, I will pray hard for her. I will have to try to get hold of her son today. And talk with him.  I appreciate all the gray care that she is getting from everybody on 2S.  [name] 33:6

## 2015-04-22 NOTE — Progress Notes (Signed)
I spoke to pathology about her pericardial biopsy and pericardial fluid cytology. Both are NEGATIVE for malignancy. This is not unexpected as her tumor markers are all negative.  For now, we do not have a diagnosis of cancer with her. I am not sure as to why she has decompensated. We cannot assume that this is malignant.  I know that she has this right lung nodule. At some point, I think this may have to be biopsied.  Laurey Arrow

## 2015-04-22 NOTE — Progress Notes (Signed)
    Subjective:  Intubated; moves ext and opens eyes but does not follow commands   Objective:  Filed Vitals:   04/22/15 0600 04/22/15 0630 04/22/15 0717 04/22/15 0800  BP: 86/23   83/28  Pulse: 101   104  Temp:   100.8 F (38.2 C)   TempSrc:   Axillary   Resp: 19   16  Height:      Weight:  75.524 kg (166 lb 8 oz)    SpO2:    100%    Intake/Output from previous day:  Intake/Output Summary (Last 24 hours) at 04/22/15 4142 Last data filed at 04/22/15 0800  Gross per 24 hour  Intake 4879.71 ml  Output   2750 ml  Net 2129.71 ml    Physical Exam: Physical exam: Well-developed chronically ill appearing; intubated Skin mottled HEENT is normal.   Chest is clear to auscultation anteriorly Cardiovascular exam is regular rate and rhythm.  Abdominal exam nontender or distended. No masses palpated. S/p pericardial window Extremities show trace edema; diminished distal pulses. neuro intubated; does not follow commands    Lab Results: Basic Metabolic Panel:  Recent Labs  04/21/15 0130 04/21/15 1245 04/21/15 2116 04/22/15 0450  NA 135 133* 132* 127*  K 3.3* 3.0* 2.7* 3.3*  CL 102 101 96* 99*  CO2 26 23  --  20*  GLUCOSE 98 96 126* 101*  BUN 9 10 14 12   CREATININE 0.82 0.88 1.00 0.97  CALCIUM 7.8* 7.3*  --  6.7*  MG 1.4*  --   --  1.8  PHOS 1.4*  --   --   --    CBC:  Recent Labs  04/21/15 0130 04/21/15 2116 04/22/15 0450  WBC 3.6*  --  4.6  NEUTROABS  --   --  4.1  HGB 11.9* 13.6 11.5*  HCT 35.6* 40.0 35.3*  MCV 90.8  --  89.8  PLT 153  --  49*     Assessment/Plan:  1 ventricular tachycardia-the patient has had runs of what appears to be torsades. I agree with supplementing magnesium and potassium. Discontinue Zofran. Check electrocardiogram. 2 status post pericardial window-follow-up echocardiogram shows no effusion. Await pathology. 3 sepsis-continue anti-bionics. 4 VDRF-vent management per critical care medicine. 5 H/O breast cancer 6 s/p  splenectomy   Kirk Ruths 04/22/2015, 8:23 AM

## 2015-04-22 NOTE — Progress Notes (Signed)
Md paged concerning pt's BP maintaining less than MAP goal of >60 and SBP>90 despite being on Vasopressin at 0.03 and Levophed at 50 mcg. CVP 5. MD to increase parameters for Vasopressin and Levophed to achieve goal. Orders received for 2L LR bolus. Will carry out orders and continue to monitor pt closely.

## 2015-04-22 NOTE — Progress Notes (Addendum)
      [address]       ,[place] [address]             [phone]      6 Days Post-Op Procedure(s) (LRB): SUBXYPHOID PERICARDIAL WINDOW (N/A) CHEST TUBE INSERTION (Left) TRANSESOPHAGEAL ECHOCARDIOGRAM (TEE) (N/A)   Subjective:  Intubated, opens eyes, does not follow commands  Objective: Vital signs in last 24 hours: Temp:  [98.5 F (36.9 C)-102.7 F (39.3 C)] 101 F (38.3 C) (10/03 1120) Pulse Rate:  [36-124] 122 (10/03 1300) Cardiac Rhythm:  [-] Sinus tachycardia (10/03 1200) Resp:  [15-29] 24 (10/03 1300) BP: (65-119)/(17-88) 111/88 mmHg (10/03 1200) SpO2:  [95 %-100 %] 100 % (10/03 1300) Arterial Line BP: (48-133)/(31-64) 75/49 mmHg (10/03 1300) FiO2 (%):  [40 %-60 %] 50 % (10/03 0832) Weight:  [166 lb 8 oz (75.524 kg)] 166 lb 8 oz (75.524 kg) (10/03 0630)  Hemodynamic parameters for last 24 hours: CVP:  [1 mmHg-8 mmHg] 7 mmHg  Intake/Output from previous day: 10/02 0701 - 10/03 0700 In: 4830.3 [I.V.:2020.3; IV Piggyback:2810] Out: 2455 [Urine:2155; Chest Tube:300] Intake/Output this shift: Total I/O In: 2846.3 [I.V.:1216.3; NG/GT:30; IV Piggyback:1600] Out: 1115 [Urine:645; Emesis/NG output:90; Chest Tube:380]  General appearance: intubated Heart: regular rate and rhythm Lungs: crackles throughout Abdomen: soft, non-tender; bowel sounds normal; no masses,  no organomegaly Wound: clean and dry  Lab Results:  Recent Labs  04/21/15 0130 04/21/15 2116 04/22/15 0450  WBC 3.6*  --  4.6  HGB 11.9* 13.6 11.5*  HCT 35.6* 40.0 35.3*  PLT 153  --  49*   BMET:  Recent Labs  04/21/15 1245 04/21/15 2116 04/22/15 0450  NA 133* 132* 127*  K 3.0* 2.7* 3.3*  CL 101 96* 99*  CO2 23  --  20*  GLUCOSE 96 126* 101*  BUN 10 14 12   CREATININE 0.88 1.00 0.97  CALCIUM 7.3*  --  6.7*    PT/INR: No results for input(s): LABPROT, INR in the last 72 hours. ABG    Component Value Date/Time   PHART 7.391 04/22/2015 0923   HCO3 19.9* 04/22/2015  0923   TCO2 21 04/22/2015 0923   ACIDBASEDEF 4.0* 04/22/2015 0923   O2SAT 99.0 04/22/2015 0923   CBG (last 3)   Recent Labs  04/21/15 04/21/15 0027 04/21/15 0821  GLUCAP 67 109* 119*    Assessment/Plan: S/P Procedure(s) (LRB): SUBXYPHOID PERICARDIAL WINDOW (N/A) CHEST TUBE INSERTION (Left) TRANSESOPHAGEAL ECHOCARDIOGRAM (TEE) (N/A)  1. Chest tube-300 cc output yesterday- will remain in place due to high output, and will likely remain in place until patient extubated 2. Wound- Pericardial window incision is clean and dry 3. Dispo- continue chest tube due to increasing output, care per CCM   LOS: 8 days    [name], [name] 04/22/2015  Agree with stress steroid coverage Path on pericardial tissue negative Leave L chest tube and follow R pleural effusion  patient examined and medical record reviewed,agree with above note. [name] 04/22/2015

## 2015-04-22 NOTE — Progress Notes (Signed)
eLink Physician-Brief Progress Note Patient Name: [name] [name] DOB: July 16, 1935 MRN: [id number]   Date of Service  04/22/2015  HPI/Events of Note  Freq PVCs , then brief torsades   eICU Interventions  2g mag stat, await labs      Intervention Category Major Interventions: Arrhythmia - evaluation and management  ALVA,RAKESH V. 04/22/2015, 5:10 AM

## 2015-04-22 NOTE — Progress Notes (Signed)
PULMONARY / CRITICAL CARE MEDICINE   Name: [name] [name] MRN: [id number] DOB: 11-26-1934    ADMISSION DATE:  04/03/2015  REFERRING MD :  [facility]  CHIEF COMPLAINT:  Cough  INITIAL PRESENTATION:  80 yo admitted with cough and fever from sepsis and HCAP.  She was found to have cardiac tamponade.  She has hx of breast cancer with possible metastatic disease, MGUS.  STUDIES:  9/26 Thoracentesis  9/26 CT chest >> b/l pleural effusions, large pericardial effusion, 2.3 cm RLL nodule (was 1.8 cm) 9/26 Echo >> tamponade  SIGNIFICANT EVENTS: 9/25 Admit, oncology consult 9/26 Cardiology consulted 9/27 TCTS consulted >> subxyphoid pericardial window 9/28 Extubated 10/02 Fever, altered mental status >>To ICU. Intubated 10/3 NSVT and torsades.   SUBJECTIVE:  Continues to be hypotensive. Given 2 lt IVF. CVP is still low at 4.  Vaso added to norepi.   VITAL SIGNS: Temp:  [98.5 F (36.9 C)-102.7 F (39.3 C)] 100.8 F (38.2 C) (10/03 0717) Pulse Rate:  [26-118] 104 (10/03 0800) Resp:  [16-60] 16 (10/03 0800) BP: (31-128)/(11-77) 83/28 mmHg (10/03 0800) SpO2:  [93 %-100 %] 100 % (10/03 0800) Arterial Line BP: (48-132)/(31-64) 71/43 mmHg (10/03 0630) FiO2 (%):  [40 %-100 %] 60 % (10/03 0404) Weight:  [166 lb 8 oz (75.524 kg)] 166 lb 8 oz (75.524 kg) (10/03 0630) HEMODYNAMICS: CVP:  [1 mmHg-8 mmHg] 8 mmHg VENTILATOR SETTINGS: Vent Mode:  [-] PRVC FiO2 (%):  [40 %-100 %] 60 % Set Rate:  [16 bmp-20 bmp] 16 bmp Vt Set:  [520 mL] 520 mL PEEP:  [5 cmH20] 5 cmH20 Plateau Pressure:  [19 cmH20-21 cmH20] 21 cmH20 INTAKE / OUTPUT:  Intake/Output Summary (Last 24 hours) at 04/22/15 0820 Last data filed at 04/22/15 0800  Gross per 24 hour  Intake 4879.71 ml  Output   2750 ml  Net 2129.71 ml    PHYSICAL EXAMINATION: General: ill appearing Neuro:  Sedated. Cardiovascular:  tachycardic Lungs:  Bilateral crackles. Abdomen:  Soft, non tender Musculoskeletal:  1+ edema Skin:  Diffuse mottling  in legs and arms. Cool to touch.  LABS:  CBC  Recent Labs Lab 04/18/15 0550 04/21/15 0130 04/21/15 2116 04/22/15 0450  WBC 20.7* 3.6*  --  4.6  HGB 11.0* 11.9* 13.6 11.5*  HCT 32.4* 35.6* 40.0 35.3*  PLT 200 153  --  49*   Coag's  Recent Labs Lab 04/09/2015 1150  APTT 35  INR 1.56*   BMET  Recent Labs Lab 04/21/15 0130 04/21/15 1245 04/21/15 2116 04/22/15 0450  NA 135 133* 132* 127*  K 3.3* 3.0* 2.7* 3.3*  CL 102 101 96* 99*  CO2 26 23  --  20*  BUN 9 10 14 12   CREATININE 0.82 0.88 1.00 0.97  GLUCOSE 98 96 126* 101*   Electrolytes  Recent Labs Lab 04/12/2015 1022  04/21/15 0055 04/21/15 0130 04/21/15 1245 04/22/15 0450  CALCIUM 8.1*  < >  --  7.8* 7.3* 6.7*  MG 1.0*  < > 1.3* 1.4*  --  1.8  PHOS 2.9  --   --  1.4*  --   --   < > = values in this interval not displayed.   Sepsis Markers  Recent Labs Lab 04/21/15 1245 04/21/15 1430  LATICACIDVEN  --  2.1*  PROCALCITON 0.70  --    ABG  Recent Labs Lab 04/21/15 1247 04/21/15 2110 04/22/15 0345  PHART 7.445 7.513* 7.530*  PCO2ART 38.5 27.9* 24.9*  PO2ART 303.0* 79.0* 64.8*   Liver Enzymes  Recent Labs Lab 04/18/15 0550  AST 98*  ALT 31  ALKPHOS 119  BILITOT 0.3  ALBUMIN 1.5*   Cardiac Enzymes No results for input(s): TROPONINI, PROBNP in the last 168 hours.   Glucose  Recent Labs Lab 04/20/15 1249 04/20/15 1628 04/20/15 2155 04/21/15 04/21/15 0027 04/21/15 0821  GLUCAP 107* 72 75 67 109* 119*    Imaging Dg Chest Port 1 View  04/22/2015   CLINICAL DATA:  Sub xiphoid pericardial window, chest tube insertion, transesophageal echocardiogram, pericardial effusion with tamponade.  EXAM: PORTABLE CHEST 1 VIEW  COMPARISON:  04/21/2015 and CT chest 04/15/2015.  FINDINGS: Endotracheal tube terminates approximately 4.3 cm above the carina. Nasogastric tube is followed into the stomach. Left chest tube terminates the upper left hemi thorax. Defibrillator pads overlie the lower left  chest.  Mild bibasilar airspace opacification. Minimal biapical pleural thickening. No airspace consolidation. Costophrenic angles were not included on the image. Haziness at the base the right hemithorax may be due to layering pleural fluid. No pneumothorax.  Surgical clips in the left axillary region.  IMPRESSION: 1. No definite pneumothorax with left chest tube in place. 2. Mild bibasilar airspace opacification, possibly due to atelectasis. 3. Possible small layering right pleural effusion.   Electronically Signed   By: Lorin Picket M.D.   On: 04/22/2015 07:46   Dg Chest Port 1 View  04/21/2015   CLINICAL DATA:  Intubation for acute respiratory failure. Recent cardiac tamponade and pericardial window.  EXAM: PORTABLE CHEST 1 VIEW  COMPARISON:  04/21/2015  FINDINGS: Cardiomediastinal silhouette is unchanged.  An endotracheal tube with tip 4 cm above the carina and NG tube entering the stomach with tip off the field of view noted.  A left thoracostomy tube and left IJ central venous catheter with tip overlying the mid SVC again noted.  There is no evidence of pneumothorax or pulmonary edema. Surgical clips overlying the left axilla noted.  IMPRESSION: Endotracheal tube placement with tip 4 cm above the carina and NG tube entering the stomach with tip off the field of view. Other support apparatus as described.  No other significant change.   Electronically Signed   By: Margarette Canada M.D.   On: 04/21/2015 12:36     ASSESSMENT / PLAN:  PULMONARY A: Acute hypoxic respiratory failure. Rt lung nodule. Pleural effusions. P:   Continue full vent support. ABG shows alkalosis with reduced PcO2 Adjust TV to 6cc/kg. Repeat ABG  CARDIOVASCULAR Lt IJ CVL >>  A:  Pericardial effusion with tamponade s/p window. Hx of diastolic CHF, CAD, HLD. NSVT and torsades P:  Monitor hemodynamics Follow lactic acid. Will need more fluid to keep the CVP ~10. Will bolus 1 more lt IVF. Keep drip at 150cc/hr. Start  stress dose steroids as the cortisol level is low. More repletions of K and Mg ordered.  May need amio if the VT continues to be an issue. Will ask cardiology to assess.  RENAL A:   AKI. Hypokalemia. P:   Monitor renal fx Replace electrolytes as needed  GASTROINTESTINAL A:   Nutrition. P:   NPO  HEMATOLOGIC A:   Hx of splenectomy, MGUS, breast cancer. Thrombocytopenia- may be secondary to stress, sepsis P:  F/u CBC  INFECTIOUS A:   HCAP. P:   Day 8 Abx >> change to primaxin, continue vancomycin Check procalcitonin  Blood 10/02 >> Urine 10/02 >> Sputum 10/02 >>  ENDOCRINE A:   No acute issues. P:   Monitor CBG on BMET  NEUROLOGIC A:  Acute encephalopathy. P:   Monitor mental status Will hold on MRI head until she is more stable.  Hold lexapro  Daughter [name] updated over the phone [phone]) 10/3  CC time 45 minutes.  Marshell Garfinkel MD [facility] Pager [phone] If no answer or after 3pm call: [phone] 04/22/2015, 8:20 AM

## 2015-04-22 NOTE — Consult Note (Signed)
WOC by to assess patient a second time.  She is still very labile and when turned last evening she had some Vtach.  Assessed heels today, her entire bilateral LE are mottled from the toes to the knees, her arms are mottled.  The heels are very deep purple consistent with the other discoloration of her legs and arms.  She is on maximum vasopressor support at this time and tachycardic.  BP remains low.   I was not able to assess her sacrum today due to patient instability. Will check back tomorrow for sacral assessment.  Continue silicone foam to the sacrum and protect heels with foam as well. Will add Prevalon boots as well.  [place], [place]

## 2015-04-22 NOTE — Progress Notes (Signed)
eLink Physician-Brief Progress Note Patient Name: [name] [name] DOB: 1935-04-29 MRN: [id number]   Date of Service  04/22/2015  HPI/Events of Note  Radial aline fell off but SBP is 137 and diast 94 and good cuff readings  eICU Interventions  Monitor with bp cuff without aline     Intervention Category Evaluation Type: Other  Murle Hellstrom 04/22/2015, 5:44 PM

## 2015-04-22 NOTE — Progress Notes (Signed)
Utilization review completed.  

## 2015-04-22 NOTE — Progress Notes (Signed)
Md paged concerning Art Line removal and inability to find Dorsalis pedis pulses bilaterally.  Bilateral Femoral pulses dopplered. No new orders received.  Will continue to monitor pt closely.

## 2015-04-22 NOTE — Care Management Important Message (Signed)
Important Message  Patient Details  Name: [name] [name] MRN: [id number] Date of Birth: [birth year]   Medicare Important Message Given:  Yes-third notification given    Delorse Lek 04/22/2015, 12:56 PM

## 2015-04-22 NOTE — Procedures (Signed)
ELECTROENCEPHALOGRAM REPORT   Patient: [name] [name]       Room #: 3T53 EEG No. ID: [id number] Age: 80 y.o.        Sex: female Referring Physician: Byrum Report Date:  04/22/2015        Interpreting Physician: Alexis Goodell  History: [name] is an 80 y.o. female with syncope evaluated to rule out seizure  Medications:  Scheduled: . antiseptic oral rinse  7 mL Mouth Rinse 10 times per day  . chlorhexidine gluconate  15 mL Mouth Rinse BID  . folic acid  2 mg Intravenous Daily  . hydrocortisone sod succinate (SOLU-CORTEF) inj  100 mg Intravenous Q12H  . imipenem-cilastatin  500 mg Intravenous Q8H  . pantoprazole (PROTONIX) IV  40 mg Intravenous Q24H  . raloxifene  60 mg Oral q morning - 10a  . sodium chloride  1,000 mL Intravenous Once  . sodium chloride  10-40 mL Intracatheter Q12H  . vancomycin  1,000 mg Intravenous Q24H    Conditions of Recording:  This is a 16 channel EEG carried out with the patient in the awake state.  Patient is intubated but not sedated.    Description:  The background activity is slow and poorly organized.  It consists of a low to moderate voltage mixture of theta and delta activity with the delta activity being most predominant.  The background activity is continuous.  Superimposed are frequent intermittent periodic discharges of triphasic morphology. There is no evidence of normal sleep.   Hyperventilation and intermittent photic stimulation were not performed  IMPRESSION: This is an abnormal electroencephalogram secondary to general background slowing and frequent triphasic waves.  This finding is most consistent with an encephalopathy.  Although most commonly associated with a hepatic encephalopathy, other causes of encephalopathy can be seen as possible etiologies as well.     Alexis Goodell, MD [facility] [phone] 04/22/2015, 4:22 PM

## 2015-04-23 ENCOUNTER — Inpatient Hospital Stay (HOSPITAL_COMMUNITY): Payer: Medicare Other

## 2015-04-23 LAB — GLUCOSE, CAPILLARY
Glucose-Capillary: 146 mg/dL — ABNORMAL HIGH (ref 65–99)
Glucose-Capillary: 96 mg/dL (ref 65–99)
Glucose-Capillary: 131 mg/dL — ABNORMAL HIGH (ref 65–99)
Glucose-Capillary: 67 mg/dL (ref 65–99)

## 2015-04-23 LAB — CBC
HCT: 36 % (ref 36.0–46.0)
Hemoglobin: 12.2 g/dL (ref 12.0–15.0)
MCH: 30.3 pg (ref 26.0–34.0)
MCHC: 33.9 g/dL (ref 30.0–36.0)
MCV: 89.3 fL (ref 78.0–100.0)
PLATELETS: 61 10*3/uL — AB (ref 150–400)
RBC: 4.03 MIL/uL (ref 3.87–5.11)
RDW: 16 % — AB (ref 11.5–15.5)
WBC: 7.6 10*3/uL (ref 4.0–10.5)

## 2015-04-23 LAB — BASIC METABOLIC PANEL
ANION GAP: 10 (ref 5–15)
Anion gap: 13 (ref 5–15)
BUN: 16 mg/dL (ref 6–20)
BUN: 17 mg/dL (ref 6–20)
CALCIUM: 7 mg/dL — AB (ref 8.9–10.3)
CHLORIDE: 100 mmol/L — AB (ref 101–111)
CO2: 18 mmol/L — ABNORMAL LOW (ref 22–32)
CO2: 18 mmol/L — ABNORMAL LOW (ref 22–32)
CREATININE: 1.06 mg/dL — AB (ref 0.44–1.00)
Calcium: 7.3 mg/dL — ABNORMAL LOW (ref 8.9–10.3)
Chloride: 101 mmol/L (ref 101–111)
Creatinine, Ser: 1.01 mg/dL — ABNORMAL HIGH (ref 0.44–1.00)
GFR calc Af Amer: >60 mL/min (ref >60)
GFR calc Af Amer: 57 mL/min — ABNORMAL LOW (ref >60)
GFR calc non Af Amer: 49 mL/min — ABNORMAL LOW (ref >60)
GFR, EST NON AFRICAN AMERICAN: 52 mL/min — AB (ref >60)
GLUCOSE: 178 mg/dL — AB (ref 65–99)
GLUCOSE: 186 mg/dL — AB (ref 65–99)
POTASSIUM: 3.8 mmol/L (ref 3.5–5.1)
POTASSIUM: 3.7 mmol/L (ref 3.5–5.1)
SODIUM: 129 mmol/L — AB (ref 135–145)
Sodium: 131 mmol/L — ABNORMAL LOW (ref 135–145)

## 2015-04-23 LAB — VANCOMYCIN, TROUGH: Vancomycin Tr: 15 ug/mL (ref 10.0–20.0)

## 2015-04-23 LAB — PROCALCITONIN: Procalcitonin: 4.71 ng/mL

## 2015-04-23 LAB — PHOSPHORUS: Phosphorus: 4.2 mg/dL (ref 2.5–4.6)

## 2015-04-23 LAB — MAGNESIUM: MAGNESIUM: 2.3 mg/dL (ref 1.7–2.4)

## 2015-04-23 MED ORDER — VITAL HIGH PROTEIN PO LIQD: 1000.0000 mL | ORAL | Status: DC

## 2015-04-23 MED ORDER — FUROSEMIDE 10 MG/ML IJ SOLN
40.0000 mg | Freq: Once | INTRAMUSCULAR | Status: AC
  Administered 2015-04-23: 40 mg via INTRAVENOUS
  Filled 2015-04-23: qty 4

## 2015-04-23 MED ORDER — VITAL AF 1.2 CAL PO LIQD
1000.0000 mL | ORAL | Status: DC
  Administered 2015-04-23 – 2015-04-24 (×2): 1000 mL
  Filled 2015-04-23 (×4): qty 1000

## 2015-04-23 NOTE — Progress Notes (Addendum)
PULMONARY / CRITICAL CARE MEDICINE   Name: [name] [name] MRN: [id number] DOB: September 20, 1934    ADMISSION DATE:  04/16/2015  REFERRING MD :  [facility]  CHIEF COMPLAINT:  Cough  INITIAL PRESENTATION:  80 yo admitted with cough and fever from sepsis and HCAP.  She was found to have cardiac tamponade.  She has hx of breast cancer with possible metastatic disease, MGUS.  STUDIES:  9/26 Thoracentesis  9/26 CT chest >> b/l pleural effusions, large pericardial effusion, 2.3 cm RLL nodule (was 1.8 cm) 9/26 Echo >> tamponade  SIGNIFICANT EVENTS: 9/25 Admit, oncology consult 9/26 Cardiology consulted 9/27 TCTS consulted >> subxyphoid pericardial window 9/28 Extubated 10/02 Fever, altered mental status >>To ICU. Intubated 10/3 NSVT and torsades.   SUBJECTIVE:  BP much better. Weaning off the pressors.  VITAL SIGNS: Temp:  [96.3 F (35.7 C)-101 F (38.3 C)] 97.5 F (36.4 C) (10/04 0700) Pulse Rate:  [79-129] 108 (10/04 0910) Resp:  [15-34] 24 (10/04 0910) BP: (57-179)/(17-118) 179/84 mmHg (10/04 0910) SpO2:  [93 %-100 %] 99 % (10/04 0910) Arterial Line BP: (75-173)/(45-83) 173/83 mmHg (10/04 0900) FiO2 (%):  [40 %] 40 % (10/04 0910) Weight:  [163 lb 7 oz (74.135 kg)] 163 lb 7 oz (74.135 kg) (10/04 0500) HEMODYNAMICS: CVP:  [4 mmHg-9 mmHg] 8 mmHg VENTILATOR SETTINGS: Vent Mode:  [-] PRVC FiO2 (%):  [40 %] 40 % Set Rate:  [16 bmp] 16 bmp Vt Set:  [390 mL] 390 mL PEEP:  [5 cmH20] 5 cmH20 Plateau Pressure:  [14 cmH20-19 cmH20] 19 cmH20 INTAKE / OUTPUT:  Intake/Output Summary (Last 24 hours) at 04/23/15 0927 Last data filed at 04/23/15 0908  Gross per 24 hour  Intake 6108.09 ml  Output   3160 ml  Net 2948.09 ml    PHYSICAL EXAMINATION: General: ill appearing Neuro:  Sedated. Cardiovascular:  tachycardic Lungs:  Bilateral crackles. Abdomen:  Soft, non tender Musculoskeletal:  1+ edema Skin:  Diffuse mottling in legs and arms. Cool to touch.  LABS:  CBC  Recent Labs Lab  04/21/15 0130 04/21/15 2116 04/22/15 0450 04/23/15 0424  WBC 3.6*  --  4.6 7.6  HGB 11.9* 13.6 11.5* 12.2  HCT 35.6* 40.0 35.3* 36.0  PLT 153  --  49* 61*   Coag's  Recent Labs Lab 03/29/2015 1150  APTT 35  INR 1.56*   BMET  Recent Labs Lab 04/22/15 0450 04/22/15 1300 04/23/15 0424  NA 127* 131* 129*  K 3.3* 3.7 3.8  CL 99* 105 101  CO2 20* 19* 18*  BUN 12 11 16   CREATININE 0.97 0.93 1.01*  GLUCOSE 101* 133* 178*   Electrolytes  Recent Labs Lab 04/21/15 0130  04/22/15 0450 04/22/15 1300 04/23/15 0424  CALCIUM 7.8*  < > 6.7* 6.6* 7.0*  MG 1.4*  --  1.8 2.6* 2.3  PHOS 1.4*  --   --  3.2 4.2  < > = values in this interval not displayed.   Sepsis Markers  Recent Labs Lab 04/21/15 1245 04/21/15 1430 04/22/15 0450 04/22/15 0921 04/23/15 0424  LATICACIDVEN  --  2.1*  --  2.0  --   PROCALCITON 0.70  --  4.90  --  4.71   ABG  Recent Labs Lab 04/21/15 2110 04/22/15 0345 04/22/15 0923  PHART 7.513* 7.530* 7.391  PCO2ART 27.9* 24.9* 33.3*  PO2ART 79.0* 64.8* 122.0*   Liver Enzymes  Recent Labs Lab 04/18/15 0550  AST 98*  ALT 31  ALKPHOS 119  BILITOT 0.3  ALBUMIN 1.5*  Cardiac Enzymes No results for input(s): TROPONINI, PROBNP in the last 168 hours.   Glucose  Recent Labs Lab 04/20/15 1249 04/20/15 1628 04/20/15 2155 04/21/15 04/21/15 0027 04/21/15 0821  GLUCAP 107* 72 75 67 109* 119*    Imaging Dg Chest Port 1 View  04/23/2015   CLINICAL DATA:  Acute onset of respiratory distress. Initial encounter.  EXAM: PORTABLE CHEST 1 VIEW  COMPARISON:  Chest radiograph performed 04/22/2015  FINDINGS: The patient's endotracheal tube is seen ending 4 cm above the carina. The enteric tube is noted extending below the diaphragm. A left IJ line is noted ending about the mid SVC. A left-sided chest tube is noted.  A small right pleural effusion is noted. Vascular congestion is noted, with bilateral central airspace opacities, likely reflecting  pulmonary edema. No pneumothorax is seen.  The cardiomediastinal silhouette is borderline normal in size. No acute osseous abnormalities are identified. Scattered clips are seen at the left axilla.  IMPRESSION: 1. Endotracheal tube seen ending 4 cm above the carina. 2. Small right pleural effusion noted. Vascular congestion, with bilateral central airspace opacities, reflecting mildly worsened pulmonary edema.   Electronically Signed   By: Garald Balding M.D.   On: 04/23/2015 05:36     ASSESSMENT / PLAN:  PULMONARY A: Acute hypoxic respiratory failure. Rt lung nodule. Pleural effusions. P:   Continue vent support. Will start daily awakenings and SBTs- Likely tomorrow after she is weaned off pressors  CARDIOVASCULAR Lt IJ CVL >>  A:  Pericardial effusion with tamponade s/p window. Hx of diastolic CHF, CAD, HLD. NSVT and torsades- No more episodes (10/4) P:  Monitor hemodynamics CVP better today at 8 Continue stress dose steroids as the cortisol level is low. CXR shows vascular congestion. Lasix 40 mg one time.   RENAL A:   AKI. Hypokalemia. P:   Monitor renal fx Replace electrolytes as needed  GASTROINTESTINAL A:   Nutrition. P:   D/C IV fluids. Start tube feeds  HEMATOLOGIC A:   Hx of splenectomy, MGUS, breast cancer. Thrombocytopenia- may be secondary to stress, sepsis P:  F/u CBC  INFECTIOUS A:   HCAP. P:   Day 9 Abx >> change to primaxin, continue vancomycin Check procalcitonin  Blood 10/02 >> Urine 10/02 >> Sputum 10/02 >>  ENDOCRINE A:   No acute issues. P:   Monitor CBG on BMET  NEUROLOGIC A:   Acute encephalopathy. P:   Monitor mental status Will hold on MRI head until she is more stable.  Hold lexapro  Daughter [name] updated over the phone [phone]) 10/3  CC time 35 minutes.  Marshell Garfinkel MD [place] [facility] Pager [phone] If no answer or after 3pm call: [phone] 04/23/2015, 9:27 AM

## 2015-04-23 NOTE — Progress Notes (Addendum)
ANTIBIOTIC CONSULT NOTE  Pharmacy Consult for Vancomycin, Primaxin Indication: sepsis  No Known Allergies  Patient Measurements: Height: 5' 8.5" (174 cm) Weight: 163 lb 7 oz (74.135 kg) IBW/kg (Calculated) : 65.05  Vital Signs: Temp: 97.5 F (36.4 C) (10/04 0700) Temp Source: Axillary (10/04 0700) BP: 179/84 mmHg (10/04 0910) Pulse Rate: 108 (10/04 0910) Intake/Output from previous day: 10/03 0701 - 10/04 0700 In: 6296.9 [I.V.:4356.9; NG/GT:90; IV Piggyback:1850] Out: 4497 [Urine:1245; Emesis/NG output:120; Chest Tube:1980] Intake/Output from this shift: Total I/O In: 448.2 [I.V.:448.2] Out: 340 [Urine:40; Chest Tube:300]  Labs:  Recent Labs  04/21/15 0130  04/21/15 2116 04/22/15 0450 04/22/15 1300 04/23/15 0424  WBC 3.6*  --   --  4.6  --  7.6  HGB 11.9*  --  13.6 11.5*  --  12.2  PLT 153  --   --  49*  --  61*  CREATININE 0.82  < > 1.00 0.97 0.93 1.01*  < > = values in this interval not displayed. Estimated Creatinine Clearance: 47.2 mL/min (by C-G formula based on Cr of 1.01).  Recent Labs  04/23/15 0900  VANCOTROUGH 15    Assessment: 80 y.o. female on D#10 antibiotics for HCAP and r/o sepsis. Tm 101, fever curve trending down, WBC 7.6. Vancomycin trough 15, low-end therapeutic on 1g Q 24 hrs, but only received 3 doses after dose adjustment on Friday. SCr 0.97> 1.06 - has been mainly stable, some fluctuation (0.7-1.07).  vancomycin 9/25>> ceftazidime 9/25>> 10/2 imipenem 10/2>>  9/25 blood: neg 9/25 urine: neg x2 9/26 sputum: few streptococcus, beta hemolytic not group A 9/26 pleural fungal stain: ngtd (will exam for 6 wks) 9/27 pleural fluid: neg for malignancy 9/27 AFB: ngtd 9/27 pericardium fluid: neg 9/27 pericardial tissue: neg 10/2 BCx: ngtd 10/2 urine: neg 10/2 sputum: reincubated   9/28 1330 VT: 6 (Of note: one dose was missed 9/27) - Increased from 500 q12 to 500 q8 9/30 VT = 23, drawn 1 hr late. Change to 1g q24h 10/4 VT = 15 on 1g Q  24 -> no change  Goal of Therapy:  Vancomycin trough level 15-20 mcg/ml  Eradication of infection Appropriate antibiotic dosing for indication and renal function  Plan:  - Continue vancomycin 1g IV q24h - Continue imipenem-cilastin 500mg  IV q8h - Follow clinical progression, culture results as available, renal function and LOT  Maryanna Shape, PharmD, BCPS  Clinical Pharmacist  Pager: [phone]  04/23/2015 10:45 AM

## 2015-04-23 NOTE — Consult Note (Signed)
WOC wound consult note Reason for Consult: sacrum and heels I have assessed heels this week previously, however each time I have been by to assess her sacrum she has been very labile and staff have not wanted to really turn her over.  Today her BP has improved and her heart rate is down some. Wound type: Stage III Pressure injury sacrum Pressure Ulcer POA No Measurement:3cm x 0.5cm x 0.2cm  Wound ZLD:JTTS, some yellow  Drainage (amount, consistency, odor) none Periwound: purple consistent with other discolorations of the patient arms and legs.  Dressing procedure/placement/frequency: Continue sacral foam. LALM in place for pressure redistribution.  Prevalon boots in place for heel offloading. WOC will continue to monitor. Explained skin breakdown to family present in the room. Patient intubated.  [facility] team will follow along with you for weekly wound assessments.  Please notify me of any acute changes in the wounds or any new areas of concerns Para March RN,CWOCN [phone]

## 2015-04-23 NOTE — Progress Notes (Signed)
    Subjective:  Intubated; moves ext and opens eyes    Objective:  Filed Vitals:   04/23/15 0615 04/23/15 0630 04/23/15 0645 04/23/15 0700  BP: 146/50 153/82 153/68 126/55  Pulse: 109 108 111 113  Temp:      TempSrc:      Resp: 22 22 32 23  Height:      Weight:      SpO2: 96% 95% 97% 99%    Intake/Output from previous day:  Intake/Output Summary (Last 24 hours) at 04/23/15 0757 Last data filed at 04/23/15 0700  Gross per 24 hour  Intake 6262.21 ml  Output   3345 ml  Net 2917.21 ml    Physical Exam: Physical exam: Well-developed chronically ill appearing; intubated Skin mottled HEENT is normal.   Chest is clear to auscultation anteriorly Cardiovascular exam is regular rate and rhythm. +rub Abdominal exam nontender or distended. No masses palpated. S/p pericardial window Extremities show 1+ edema; diminished distal pulses. neuro intubated; opens eyes to questions    Lab Results: Basic Metabolic Panel:  Recent Labs  04/22/15 1300 04/23/15 0424  NA 131* 129*  K 3.7 3.8  CL 105 101  CO2 19* 18*  GLUCOSE 133* 178*  BUN 11 16  CREATININE 0.93 1.01*  CALCIUM 6.6* 7.0*  MG 2.6* 2.3  PHOS 3.2 4.2   CBC:  Recent Labs  04/22/15 0450 04/23/15 0424  WBC 4.6 PENDING  NEUTROABS 4.1  --   HGB 11.5* 12.2  HCT 35.3* 36.0  MCV 89.8 89.3  PLT 49* PENDING     Assessment/Plan:  1 ventricular tachycardia-no further VT overnight. Follow electrolytes and supplement as needed 2 status post pericardial window-follow-up echocardiogram shows no effusion.  3 sepsis-continue antibiotics. 4 VDRF-vent management per critical care medicine. 5 H/O breast cancer 6 s/p splenectomy   Kirk Ruths 04/23/2015, 7:57 AM

## 2015-04-23 NOTE — Clinical Social Work Note (Signed)
CSW consulted regarding possible SNF placement at time of discharge. Per chart review, patient admitted from [facility] and remains intubated as of 04/23/2015. CSW contacted admissions liaison for [facility]. Per [employer] liaison, patient admitted to SNF on 04/12/2015 and discharged on 03/28/2015.  CSW to continue to follow and complete work-up for SNF placement when medically appropriate.  Lubertha Sayres, [place] Clinical Social Work Department Orthopedics [phone]) and Surgical [phone])

## 2015-04-23 NOTE — Progress Notes (Signed)
Rt attempted placing art line on right side twice with pt pulling the first attempt from rt.  Third attempt was on left side which RN said restriction was not an issue due to mastectomy being done in 1981.  Little to no pulse felt, but small pulse noted with doppler.  No placement was possible at this time.  Rn called Dr. Elsworth Soho which was sending someone to place a femoral line.

## 2015-04-23 NOTE — Progress Notes (Signed)
Dr. Elsworth Soho spoken with regarding pt drop in BP, urine output, and swelling around chest tube. Orders received, will continue to monitor. Eleonore Chiquito RN 2 [place]

## 2015-04-23 NOTE — Procedures (Signed)
Arterial Catheter Insertion Procedure Note Kyoko Elsea [id number] 07/11/1935  Procedure: Insertion of Arterial Catheter  Indications: Blood pressure monitoring  Procedure Details Consent: Risks of procedure as well as the alternatives and risks of each were explained to the (patient/caregiver).  Consent for procedure obtained. Time Out: Verified patient identification, verified procedure, site/side was marked, verified correct patient position, special equipment/implants available, medications/allergies/relevent history reviewed, required imaging and test results available.  Performed  Maximum sterile technique was used including antiseptics, cap, gloves, gown, hand hygiene, mask and sheet. Skin prep: Chlorhexidine; local anesthetic administered 22 gauge catheter was inserted into left femoral artery using the Seldinger technique.  Evaluation Blood flow good; BP tracing good. Complications: No apparent complications.   Ainhoa Rallo 04/23/2015

## 2015-04-23 NOTE — Progress Notes (Addendum)
      [address]       [place] [address]             [phone]      7 Days Post-Op Procedure(s) (LRB): SUBXYPHOID PERICARDIAL WINDOW (N/A) CHEST TUBE INSERTION (Left) TRANSESOPHAGEAL ECHOCARDIOGRAM (TEE) (N/A)   Subjective:  Remains intubated, opens eyes, moving extremities  Objective: Vital signs in last 24 hours: Temp:  [96.3 F (35.7 C)-101 F (38.3 C)] 97.5 F (36.4 C) (10/04 0700) Pulse Rate:  [79-129] 108 (10/04 0910) Cardiac Rhythm:  [-] Normal sinus rhythm (10/04 0400) Resp:  [15-34] 24 (10/04 0910) BP: (57-179)/(17-118) 179/84 mmHg (10/04 0910) SpO2:  [93 %-100 %] 99 % (10/04 0910) Arterial Line BP: (75-173)/(45-83) 173/83 mmHg (10/04 0900) FiO2 (%):  [40 %] 40 % (10/04 0910) Weight:  [163 lb 7 oz (74.135 kg)] 163 lb 7 oz (74.135 kg) (10/04 0500)  Hemodynamic parameters for last 24 hours: CVP:  [4 mmHg-9 mmHg] 8 mmHg  Intake/Output from previous day: 10/03 0701 - 10/04 0700 In: 6296.9 [I.V.:4356.9; NG/GT:90; IV Piggyback:1850] Out: 8413 [Urine:1245; Emesis/NG output:120; Chest Tube:1980] Intake/Output this shift: Total I/O In: 400 [I.V.:400] Out: 230 [Urine:30; Chest Tube:200]  General appearance: intubated Heart: regular rate and rhythm Lungs: clear to auscultation bilaterally Wound: clean and dry  Lab Results:  Recent Labs  04/22/15 0450 04/23/15 0424  WBC 4.6 7.6  HGB 11.5* 12.2  HCT 35.3* 36.0  PLT 49* 61*   BMET:  Recent Labs  04/22/15 1300 04/23/15 0424  NA 131* 129*  K 3.7 3.8  CL 105 101  CO2 19* 18*  GLUCOSE 133* 178*  BUN 11 16  CREATININE 0.93 1.01*  CALCIUM 6.6* 7.0*    PT/INR: No results for input(s): LABPROT, INR in the last 72 hours. ABG    Component Value Date/Time   PHART 7.391 04/22/2015 0923   HCO3 19.9* 04/22/2015 0923   TCO2 21 04/22/2015 0923   ACIDBASEDEF 4.0* 04/22/2015 0923   O2SAT 99.0 04/22/2015 0923   CBG (last 3)   Recent Labs  04/21/15 04/21/15 0027 04/21/15 0821    GLUCAP 67 109* 119*    Assessment/Plan: S/P Procedure(s) (LRB): SUBXYPHOID PERICARDIAL WINDOW (N/A) CHEST TUBE INSERTION (Left) TRANSESOPHAGEAL ECHOCARDIOGRAM (TEE) (N/A)  1. Chest tube- 1930 cc output yesterday- will remain in place until extubated and output decreases 2. Pulm- worsening pulmonary edema on CXR 3. ID- septic on ABX 4. Dispo- patient remains intubated, worsening pulmonary edema on CXR, increased CT output- leave in place until extubated and drainage decreases, care per CCM   LOS: 9 days    [name] [name] 04/23/2015  I have seen and examined the patient and agree with the assessment and plan as outlined.  Rexene Alberts, MD 04/23/2015 6:38 PM

## 2015-04-23 NOTE — Progress Notes (Signed)
Nutrition Consult/Follow Up  DOCUMENTATION CODES:   Severe malnutrition in context of acute illness/injury  INTERVENTION:   Initiate Vital AF 1.2 formula at 20 ml/hr and increase by 10 ml every 4 hours to goal rate of 50 ml/hr  TF regimen to provide 1440 kcals, 108 gm protein, 973 ml of free water  NUTRITION DIAGNOSIS:   Inadequate oral intake related to inability to eat as evidenced by NPO status, ongoing  GOAL:   Patient will meet greater than or equal to 90% of their needs, currently unmet  MONITOR:   TF tolerance, Vent status, Labs, Weight trends, I & O's  ASSESSMENT:   80 yo Female with PMH of breast cancer status post mastectomy, diastolic CHF and MGUS who was 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 She was admitted on 9/25 for increased fever, cough and congestion coming from her skilled nursing facility and admitted for sepsis secondary to pneumonia causing acute respiratory failure.   Patient transferred to from 3S-Stepdown to 2S-SICU 10/3 with fever and altered mental status.  Patient is currently intubated on ventilator support -- OGT in place MV: 9.7 L/min Temp (24hrs), Avg:97.3 F (36.3 C), Min:96.3 F (35.7 C), Max:98.3 F (36.8 C)   Pt previously on a Regular diet.  PO intake 50-100% per flowsheet records.  RD consulted for TF initiation & management.  Diet Order:  Diet NPO time specified  Skin:  Wound (see comment) (DTI to L heel; stage II pressure ulcer to buttocks)  Last BM:  9/30  Height:   Ht Readings from Last 1 Encounters:  04/21/15 5' 8.5" (1.74 m)    Weight:   Wt Readings from Last 1 Encounters:  04/23/15 163 lb 7 oz (74.135 kg)    Ideal Body Weight:  63.64 kg  BMI:  Body mass index is  24.49 kg/(m^2).  Estimated Nutritional Needs:   Kcal:  1465  Protein:  110-120 gm  Fluid:  per MD  EDUCATION NEEDS:   No education needs identified at this time  Arthur Holms, RD, LDN Pager #: [phone] After-Hours Pager #: [phone]

## 2015-04-23 NOTE — Progress Notes (Signed)
Informed Dr. Elsworth Soho of RT's unsuccessful A line insertion attempt. Informed that he will send someone over. Eleonore Chiquito RN 2 [place]

## 2015-04-23 NOTE — Progress Notes (Signed)
[name] is still intubated. Her blood pressure seems to be doing a whole lot better. Hopefully, she will be oh to come off the pressor support. So far, her cultures have all been negative.  Again, the pericardial fluid and pericardial biopsy are both negative for any malignancy. I'm still not sure as to why she developed the pericardial fluid. I suppose that she still may have some malignancy but wheeze have not yet found anything. She is in no condition to undergo a percutaneous biopsy of the right lung nodule.  Her labs show her BUN and creatinine to be okay. Her blood sugars up a little bit. Her white cell count plate count are pending. Her hemoglobin is 12.2.  She still has a lot of mottling on her extremities.  On her physical exam, her temperature is 98.3. Pulse is 113. Blood pressure 126/55. Her lungs have some scattered wheezes. She has fairly good air movement bilaterally. Cardiac exam is tachycardic but regular. She does have a rub. Abdomen is soft. Bowel sounds are slightly decreased. Extremities shows mottling.  I left a message with her son yesterday telling him about the negative pathology result.  I still cannot "put all of this together". I cannot say at this is paraneoplastic as we have not identified any obvious malignancy. I don't know this is some collagen vascular issue.  We will continue to follow along.  I really appreciate all the great care that she is getting from the staff on 2S!!!  [name].  Rodman Key 7:7

## 2015-04-24 ENCOUNTER — Inpatient Hospital Stay (HOSPITAL_COMMUNITY): Payer: Medicare Other

## 2015-04-24 LAB — BASIC METABOLIC PANEL
Anion gap: 9 (ref 5–15)
BUN: 23 mg/dL — AB (ref 6–20)
CHLORIDE: 100 mmol/L — AB (ref 101–111)
CO2: 22 mmol/L (ref 22–32)
CREATININE: 1.01 mg/dL — AB (ref 0.44–1.00)
Calcium: 7 mg/dL — ABNORMAL LOW (ref 8.9–10.3)
GFR calc Af Amer: >60 mL/min (ref >60)
GFR calc non Af Amer: 52 mL/min — ABNORMAL LOW (ref >60)
GLUCOSE: 125 mg/dL — AB (ref 65–99)
Potassium: 3.2 mmol/L — ABNORMAL LOW (ref 3.5–5.1)
SODIUM: 131 mmol/L — AB (ref 135–145)

## 2015-04-24 LAB — GLUCOSE, CAPILLARY
GLUCOSE-CAPILLARY: 119 mg/dL — AB (ref 65–99)
Glucose-Capillary: 108 mg/dL — ABNORMAL HIGH (ref 65–99)
Glucose-Capillary: 138 mg/dL — ABNORMAL HIGH (ref 65–99)
Glucose-Capillary: 122 mg/dL — ABNORMAL HIGH (ref 65–99)
Glucose-Capillary: 99 mg/dL (ref 65–99)

## 2015-04-24 LAB — CULTURE, RESPIRATORY

## 2015-04-24 LAB — C DIFFICILE QUICK SCREEN W PCR REFLEX
C Diff antigen: NEGATIVE
C Diff interpretation: NEGATIVE
C Diff toxin: NEGATIVE

## 2015-04-24 LAB — CBC
HCT: 27.6 % — ABNORMAL LOW (ref 36.0–46.0)
HEMOGLOBIN: 9.5 g/dL — AB (ref 12.0–15.0)
MCH: 29.8 pg (ref 26.0–34.0)
MCHC: 34.4 g/dL (ref 30.0–36.0)
MCV: 86.5 fL (ref 78.0–100.0)
Platelets: 72 10*3/uL — ABNORMAL LOW (ref 150–400)
RBC: 3.19 MIL/uL — ABNORMAL LOW (ref 3.87–5.11)
RDW: 15.8 % — ABNORMAL HIGH (ref 11.5–15.5)
WBC: 7.8 10*3/uL (ref 4.0–10.5)

## 2015-04-24 LAB — CULTURE, RESPIRATORY W GRAM STAIN

## 2015-04-24 LAB — MAGNESIUM: MAGNESIUM: 2 mg/dL (ref 1.7–2.4)

## 2015-04-24 LAB — PHOSPHORUS: Phosphorus: 3.3 mg/dL (ref 2.5–4.6)

## 2015-04-24 MED ORDER — FUROSEMIDE 10 MG/ML IJ SOLN
40.0000 mg | Freq: Once | INTRAMUSCULAR | Status: AC
  Administered 2015-04-24: 40 mg via INTRAVENOUS
  Filled 2015-04-24: qty 4

## 2015-04-24 MED ORDER — POTASSIUM CHLORIDE 20 MEQ/15ML (10%) PO SOLN
30.0000 meq | ORAL | Status: AC
  Administered 2015-04-24 (×2): 30 meq
  Filled 2015-04-24 (×2): qty 22.5

## 2015-04-24 MED ORDER — GERHARDT'S BUTT CREAM
TOPICAL_CREAM | Freq: Every day | CUTANEOUS | Status: DC
  Administered 2015-04-24: 1 via TOPICAL
  Administered 2015-04-25 – 2015-04-28 (×4): via TOPICAL
  Administered 2015-04-29: 1 via TOPICAL
  Administered 2015-04-30 – 2015-05-03 (×4): via TOPICAL
  Administered 2015-05-03: 1 via TOPICAL
  Administered 2015-05-04 – 2015-05-05 (×2): via TOPICAL
  Filled 2015-04-24 (×3): qty 1

## 2015-04-24 NOTE — Progress Notes (Signed)
[facility] ADULT ICU REPLACEMENT PROTOCOL FOR AM LAB REPLACEMENT ONLY  The patient does apply for the [facility] Adult ICU Electrolyte Replacment Protocol based on the criteria listed below:   1. Is GFR >/= 40 ml/min? Yes.    Patient's GFR today is >60 2. Is urine output >/= 0.5 ml/kg/hr for the last 6 hours? Yes.   Patient's UOP is 0.89 ml/kg/hr 3. Is BUN < 60 mg/dL? Yes.    Patient's BUN today is 23 4. Abnormal electrolyte(s): Potassium 3.2 5. Ordered repletion with: Potassium per protocol 6. If a panic level lab has been reported, has the CCM MD in charge been notified? No..   Physician:    Adam Phenix 04/24/2015 5:53 AM

## 2015-04-24 NOTE — Progress Notes (Signed)
RT note-placed back to full support, increased RR, coughing, restlessness.

## 2015-04-24 NOTE — Progress Notes (Signed)
PULMONARY / CRITICAL CARE MEDICINE   Name: [name] [name] MRN: [id number] DOB: [birth year]    ADMISSION DATE:  03/29/2015  REFERRING MD :  [facility]  CHIEF COMPLAINT:  Cough  INITIAL PRESENTATION:  80 yo admitted with cough and fever from sepsis and HCAP.  She was found to have cardiac tamponade.  She has hx of breast cancer with possible metastatic disease, MGUS.  STUDIES:  9/26 Thoracentesis  9/26 CT chest >> b/l pleural effusions, large pericardial effusion, 2.3 cm RLL nodule (was 1.8 cm) 9/26 Echo >> tamponade  SIGNIFICANT EVENTS: 9/25 Admit, oncology consult 9/26 Cardiology consulted 9/27 TCTS consulted >> subxyphoid pericardial window 9/28 Extubated 10/02 Fever, altered mental status >>To ICU. Intubated 10/3 NSVT and torsades.   SUBJECTIVE:  Weaned off pressors. Did not do well on weaning trial. Mental status is still poor.  VITAL SIGNS: Temp:  [97 F (36.1 C)-98.6 F (37 C)] 97 F (36.1 C) (10/05 0721) Pulse Rate:  [84-109] 84 (10/05 0700) Resp:  [19-31] 21 (10/05 0700) BP: (86-179)/(34-86) 91/46 mmHg (10/05 0700) SpO2:  [99 %-100 %] 100 % (10/05 0700) Arterial Line BP: (67-176)/(38-100) 109/55 mmHg (10/05 0700) FiO2 (%):  [40 %] 40 % (10/05 0750) Weight:  [156 lb 12 oz (71.1 kg)] 156 lb 12 oz (71.1 kg) (10/05 0300) HEMODYNAMICS: CVP:  [2 mmHg-8 mmHg] 6 mmHg VENTILATOR SETTINGS: Vent Mode:  [-] PSV;CPAP FiO2 (%):  [40 %] 40 % Set Rate:  [16 bmp] 16 bmp Vt Set:  [390 mL] 390 mL PEEP:  [5 cmH20] 5 cmH20 Pressure Support:  [10 cmH20] 10 cmH20 Plateau Pressure:  [14 cmH20-21 cmH20] 14 cmH20 INTAKE / OUTPUT:  Intake/Output Summary (Last 24 hours) at 04/24/15 0907 Last data filed at 04/24/15 0700  Gross per 24 hour  Intake 1399.76 ml  Output   2640 ml  Net -1240.24 ml    PHYSICAL EXAMINATION: General: ill appearing Neuro:  Sedated. Cardiovascular:  tachycardic Lungs:  Bilateral crackles. Abdomen:  Soft, non tender Musculoskeletal:  1+ edema Skin:  Diffuse  mottling in legs and arms. Cool to touch.  LABS:  CBC  Recent Labs Lab 04/22/15 0450 04/23/15 0424 04/24/15 0455  WBC 4.6 7.6 7.8  HGB 11.5* 12.2 9.5*  HCT 35.3* 36.0 27.6*  PLT 49* 61* 72*   Coag's No results for input(s): APTT, INR in the last 168 hours. BMET  Recent Labs Lab 04/23/15 0424 04/23/15 1236 04/24/15 0455  NA 129* 131* 131*  K 3.8 3.7 3.2*  CL 101 100* 100*  CO2 18* 18* 22  BUN 16 17 23*  CREATININE 1.01* 1.06* 1.01*  GLUCOSE 178* 186* 125*   Electrolytes  Recent Labs Lab 04/22/15 1300 04/23/15 0424 04/23/15 1236 04/24/15 0455  CALCIUM 6.6* 7.0* 7.3* 7.0*  MG 2.6* 2.3  --  2.0  PHOS 3.2 4.2  --  3.3     Sepsis Markers  Recent Labs Lab 04/21/15 1245 04/21/15 1430 04/22/15 0450 04/22/15 0921 04/23/15 0424  LATICACIDVEN  --  2.1*  --  2.0  --   PROCALCITON 0.70  --  4.90  --  4.71   ABG  Recent Labs Lab 04/21/15 2110 04/22/15 0345 04/22/15 0923  PHART 7.513* 7.530* 7.391  PCO2ART 27.9* 24.9* 33.3*  PO2ART 79.0* 64.8* 122.0*   Liver Enzymes  Recent Labs Lab 04/18/15 0550  AST 98*  ALT 31  ALKPHOS 119  BILITOT 0.3  ALBUMIN 1.5*   Cardiac Enzymes No results for input(s): TROPONINI, PROBNP in the last 168 hours.  Glucose  Recent Labs Lab 04/23/15 1545 04/23/15 1950 04/23/15 2324 04/23/15 2327 04/24/15 0411 04/24/15 0718  GLUCAP 146* 96 67 131* 108* 119*    Imaging Dg Chest Port 1 View  04/24/2015   CLINICAL DATA:  Acute respiratory failure, monoclonal gammopathy, chronic CHF  EXAM: PORTABLE CHEST 1 VIEW  COMPARISON:  Portable chest x-ray of April 23, 2015  FINDINGS: The lungs are well-expanded. There is a persistent small right pleural effusion. Persistent right hilar atelectasis is present. There is no pneumothorax. The left-sided chest tube is stable in position. The heart is not enlarged. The pulmonary vascularity is indistinct but less engorged today. The endotracheal tube tip lies 4 cm above the carina.  The esophagogastric tube tip projects below the inferior margin of the image. The left internal jugular venous catheter tip projects over the midportion of the SVC.  IMPRESSION: Stable appearance of the chest since yesterday's study. There is a small right pleural effusion persistent right perihilar atelectasis. Mild pulmonary vascular congestion persists. The support tubes are in reasonable position.   Electronically Signed   By: David  Martinique M.D.   On: 04/24/2015 07:27     ASSESSMENT / PLAN:  PULMONARY A: Acute hypoxic respiratory failure. Rt lung nodule. Pleural effusions. P:   Continue vent support. Will start daily awakenings and SBTs.  CARDIOVASCULAR Lt IJ CVL >>  A:  Pericardial effusion with tamponade s/p window. Hx of diastolic CHF, CAD, HLD. NSVT and torsades- No more episodes (10/4) P:  Monitor hemodynamics Continue stress dose steroids as the cortisol level is low. CXR shows vascular congestion. Lasix 40 mg one time.   RENAL A:   AKI. Hypokalemia. P:   Monitor renal fx Replace electrolytes as needed  GASTROINTESTINAL A:   Nutrition. P:   Continue tube feeds  HEMATOLOGIC A:   Hx of splenectomy, MGUS, breast cancer. Thrombocytopenia- may be secondary to stress, sepsis P:  F/u CBC  INFECTIOUS A:   HCAP. P:   Day 10 Abx >> change to primaxin, continue vancomycin Check procalcitonin  Blood 10/02 >> Urine 10/02 >> Sputum 10/02 >>  ENDOCRINE A:   No acute issues. P:   Monitor CBG on BMET  NEUROLOGIC A:   Acute encephalopathy. P:   Monitor mental status Get MRI head as she is more stable today. Hold lexapro  Daughter [name] updated over the phone [phone]) 10/3  CC time 35 minutes.  Marshell Garfinkel MD [facility] Pager [phone] If no answer or after 3pm call: [phone] 04/24/2015, 9:07 AM

## 2015-04-24 NOTE — Progress Notes (Signed)
      [address]       [place] [address]             [phone]      8 Days Post-Op Procedure(s) (LRB): SUBXYPHOID PERICARDIAL WINDOW (N/A) CHEST TUBE INSERTION (Left) TRANSESOPHAGEAL ECHOCARDIOGRAM (TEE) (N/A)   Subjective:  Patient remains intubated, open eyes, squeezed hand  Objective: Vital signs in last 24 hours: Temp:  [97 F (36.1 C)-98.6 F (37 C)] 97 F (36.1 C) (10/05 0721) Pulse Rate:  [84-109] 84 (10/05 0700) Cardiac Rhythm:  [-] Normal sinus rhythm;Sinus tachycardia (10/04 2000) Resp:  [19-31] 21 (10/05 0700) BP: (86-179)/(34-86) 91/46 mmHg (10/05 0700) SpO2:  [99 %-100 %] 100 % (10/05 0700) Arterial Line BP: (67-176)/(38-100) 109/55 mmHg (10/05 0700) FiO2 (%):  [40 %] 40 % (10/05 0750) Weight:  [156 lb 12 oz (71.1 kg)] 156 lb 12 oz (71.1 kg) (10/05 0300)  Hemodynamic parameters for last 24 hours: CVP:  [2 mmHg-8 mmHg] 6 mmHg  Intake/Output from previous day: 10/04 0701 - 10/05 0700 In: 1825.4 [I.V.:684.5; NG/GT:640.8; IV Piggyback:500] Out: 2870 [Urine:2000; Chest Tube:870]  General appearance: intubated, opens eyes Heart: regular rate and rhythm Lungs: clear to auscultation bilaterally Abdomen: soft, non-tender; bowel sounds normal; no masses,  no organomegaly Extremities: edema all extremities Wound: clean and dry  Lab Results:  Recent Labs  04/23/15 0424 04/24/15 0455  WBC 7.6 7.8  HGB 12.2 9.5*  HCT 36.0 27.6*  PLT 61* 72*   BMET:  Recent Labs  04/23/15 1236 04/24/15 0455  NA 131* 131*  K 3.7 3.2*  CL 100* 100*  CO2 18* 22  GLUCOSE 186* 125*  BUN 17 23*  CREATININE 1.06* 1.01*  CALCIUM 7.3* 7.0*    PT/INR: No results for input(s): LABPROT, INR in the last 72 hours. ABG    Component Value Date/Time   PHART 7.391 04/22/2015 0923   HCO3 19.9* 04/22/2015 0923   TCO2 21 04/22/2015 0923   ACIDBASEDEF 4.0* 04/22/2015 0923   O2SAT 99.0 04/22/2015 0923   CBG (last 3)   Recent Labs  04/23/15 2327  04/24/15 0411 04/24/15 0718  GLUCAP 131* 108* 119*    Assessment/Plan: S/P Procedure(s) (LRB): SUBXYPHOID PERICARDIAL WINDOW (N/A) CHEST TUBE INSERTION (Left) TRANSESOPHAGEAL ECHOCARDIOGRAM (TEE) (N/A)  1. Chest tube- 920 cc output yesterday- (level currently at 500)- leave in place on suction 2. ID- sepsis, continue ABX 3. Pulmonary- persistence pulmonary edema- no change in CXR 4. Dispo- care per CCM, chest tube will remain in place until extubated and drainage decreased   LOS: 10 days    Lee Kalt 04/24/2015

## 2015-04-24 NOTE — Progress Notes (Signed)
Attempted to collect morning ABG, Fem art line will flush but was not able to draw back on it. Would attempt ABG stick but the patient is an extremely hard stick after multiple attempts yesterday morning. RN aware. RT will continue to monitor.

## 2015-04-24 NOTE — Consult Note (Addendum)
WOC follow-up: Requested to re-assess buttocks area while patient is stable enough to turn.  Refer to previous progress notes on 10/4 for wound assessment and plan of care.  Inner upper gluteal fold/sacrum area is stage 3 pressure injury; 3X.5X.2cm, dark red woundbed with small amt yellow drainage, no odor.  Nearby to this site, there are 2 areas of deep tissue injuries; 1X1cm to lower buttock and 2X2cm to left buttock.  Both are dark reddish purple.  It is difficult to keep the dressing from becoming soiled related to frequent diarrhea stools.  Flexiseal has been placed to assist with containment of liquid stools but some still leaks around the tube insertion site and is becoming trapped underneath the foam dressing.   Plan: Leave foam dressing off and apply barrier cream to protect skin and repel moisture.  Pt is on a Sport low-airloss bed to reduce pressure.  No family present to discuss plan of care. Julien Girt MSN, RN, [place], [place], [place]

## 2015-04-24 NOTE — Progress Notes (Addendum)
    Subjective:  Intubated; moves ext and opens eyes    Objective:  Filed Vitals:   04/24/15 0600 04/24/15 0630 04/24/15 0700 04/24/15 0721  BP: 90/49 102/44 91/46   Pulse: 87 87 84   Temp:    97 F (36.1 C)  TempSrc:    Axillary  Resp: 21 23 21    Height:      Weight:      SpO2: 100% 100% 100%     Intake/Output from previous day:  Intake/Output Summary (Last 24 hours) at 04/24/15 0757 Last data filed at 04/24/15 0700  Gross per 24 hour  Intake 1825.36 ml  Output   2870 ml  Net -1044.64 ml    Physical Exam: Physical exam: Well-developed chronically ill appearing; intubated Skin mottled HEENT is normal.   Chest is clear to auscultation anteriorly Cardiovascular exam is regular rate and rhythm. +rub Abdominal exam nontender or distended. No masses palpated. S/p pericardial window Extremities show 1+ edema; diminished distal pulses. neuro intubated; opens eyes to questions    Lab Results: Basic Metabolic Panel:  Recent Labs  04/23/15 0424 04/23/15 1236 04/24/15 0455  NA 129* 131* 131*  K 3.8 3.7 3.2*  CL 101 100* 100*  CO2 18* 18* 22  GLUCOSE 178* 186* 125*  BUN 16 17 23*  CREATININE 1.01* 1.06* 1.01*  CALCIUM 7.0* 7.3* 7.0*  MG 2.3  --  2.0  PHOS 4.2  --  3.3   CBC:  Recent Labs  04/22/15 0450 04/23/15 0424 04/24/15 0455  WBC 4.6 7.6 PENDING  NEUTROABS 4.1  --   --   HGB 11.5* 12.2 9.5*  HCT 35.3* 36.0 27.6*  MCV 89.8 89.3 86.5  PLT 49* 61* PENDING     Assessment/Plan:  1 ventricular tachycardia-no further VT. Follow electrolytes and supplement as needed 2 status post pericardial window-follow-up echocardiogram shows no effusion.  3 sepsis-continue antibiotics. Now off pressors 4 VDRF-vent management per critical care medicine. 5 H/O breast cancer 6 s/p splenectomy 7 Volume excess-patient is volume overloaded; check albumin as low oncotic pressure likely contributing; would begin gentle diuresis.   Kirk Ruths 04/24/2015, 7:57  AM

## 2015-04-25 ENCOUNTER — Inpatient Hospital Stay (HOSPITAL_COMMUNITY): Payer: Medicare Other

## 2015-04-25 DIAGNOSIS — C801 Malignant (primary) neoplasm, unspecified: Secondary | ICD-10-CM

## 2015-04-25 DIAGNOSIS — I749 Embolism and thrombosis of unspecified artery: Secondary | ICD-10-CM

## 2015-04-25 LAB — CBC
HCT: 28.5 % — ABNORMAL LOW (ref 36.0–46.0)
Hemoglobin: 9.4 g/dL — ABNORMAL LOW (ref 12.0–15.0)
MCH: 29.1 pg (ref 26.0–34.0)
MCHC: 33 g/dL (ref 30.0–36.0)
MCV: 88.2 fL (ref 78.0–100.0)
PLATELETS: 82 10*3/uL — AB (ref 150–400)
RBC: 3.23 MIL/uL — AB (ref 3.87–5.11)
RDW: 16.3 % — ABNORMAL HIGH (ref 11.5–15.5)
WBC: 9 10*3/uL (ref 4.0–10.5)

## 2015-04-25 LAB — BASIC METABOLIC PANEL
Anion gap: 9 (ref 5–15)
BUN: 24 mg/dL — AB (ref 6–20)
CHLORIDE: 99 mmol/L — AB (ref 101–111)
CO2: 25 mmol/L (ref 22–32)
CREATININE: 0.86 mg/dL (ref 0.44–1.00)
Calcium: 7.3 mg/dL — ABNORMAL LOW (ref 8.9–10.3)
GFR calc Af Amer: >60 mL/min (ref >60)
GLUCOSE: 119 mg/dL — AB (ref 65–99)
Potassium: 3.3 mmol/L — ABNORMAL LOW (ref 3.5–5.1)
SODIUM: 133 mmol/L — AB (ref 135–145)

## 2015-04-25 LAB — GLUCOSE, CAPILLARY
GLUCOSE-CAPILLARY: 129 mg/dL — AB (ref 65–99)
GLUCOSE-CAPILLARY: 105 mg/dL — AB (ref 65–99)
Glucose-Capillary: 105 mg/dL — ABNORMAL HIGH (ref 65–99)
Glucose-Capillary: 115 mg/dL — ABNORMAL HIGH (ref 65–99)
Glucose-Capillary: 125 mg/dL — ABNORMAL HIGH (ref 65–99)
Glucose-Capillary: 88 mg/dL (ref 65–99)

## 2015-04-25 LAB — ALBUMIN: Albumin: 1.4 g/dL — ABNORMAL LOW (ref 3.5–5.0)

## 2015-04-25 LAB — PHOSPHORUS: Phosphorus: 2.8 mg/dL (ref 2.5–4.6)

## 2015-04-25 LAB — PROCALCITONIN: Procalcitonin: 1.82 ng/mL

## 2015-04-25 LAB — MAGNESIUM: Magnesium: 1.7 mg/dL (ref 1.7–2.4)

## 2015-04-25 MED ORDER — CETYLPYRIDINIUM CHLORIDE 0.05 % MT LIQD
7.0000 mL | Freq: Two times a day (BID) | OROMUCOSAL | Status: DC
  Administered 2015-04-25 – 2015-04-26 (×4): 7 mL via OROMUCOSAL

## 2015-04-25 MED ORDER — POTASSIUM CHLORIDE 20 MEQ/15ML (10%) PO SOLN
20.0000 meq | ORAL | Status: AC
  Administered 2015-04-25: 20 meq

## 2015-04-25 MED ORDER — GADOBENATE DIMEGLUMINE 529 MG/ML IV SOLN
15.0000 mL | Freq: Once | INTRAVENOUS | Status: AC | PRN
  Administered 2015-04-25: 14 mL via INTRAVENOUS

## 2015-04-25 MED ORDER — POTASSIUM CHLORIDE 20 MEQ/15ML (10%) PO SOLN
ORAL | Status: AC
  Filled 2015-04-25: qty 30

## 2015-04-25 NOTE — Procedures (Signed)
Extubation Procedure Note  Patient Details:   Name: [name] [name] DOB: [birth year] MRN: [id number]   Airway Documentation:     Evaluation  O2 sats: stable throughout Complications: No apparent complications Patient did tolerate procedure well. Breath sounds: mild inspiratory wheezing and rhonchi, weak cough with good vocalization. Suctioning: Airway Yes  Placed on 4l/min [place]   Revonda Standard 04/25/2015, 10:22 AM

## 2015-04-25 NOTE — Progress Notes (Addendum)
PULMONARY / CRITICAL CARE MEDICINE   Name: [name] [name] MRN: [id number] DOB: 1935/03/14    ADMISSION DATE:  03/28/2015  REFERRING MD :  [facility]  CHIEF COMPLAINT:  Cough  INITIAL PRESENTATION:  80 yo admitted with cough and fever from sepsis and HCAP.  She was found to have cardiac tamponade.  She has hx of breast cancer with possible metastatic disease, MGUS.  STUDIES:  9/26 Thoracentesis  9/26 CT chest >> b/l pleural effusions, large pericardial effusion, 2.3 cm RLL nodule (was 1.8 cm) 9/26 Echo >> tamponade  SIGNIFICANT EVENTS: 9/25 Admit, oncology consult 9/26 Cardiology consulted 9/27 T[facility] consulted >> subxyphoid pericardial window 9/28 Extubated 10/02 Fever, altered mental status >>To ICU. Intubated 10/3 NSVT and torsades.  10/6 MRI head- Negative for any acute pathology.  SUBJECTIVE:  More awake today. Doing well on weaning trials.  VITAL SIGNS: Temp:  [97 F (36.1 C)-98.1 F (36.7 C)] 97 F (36.1 C) (10/06 0700) Pulse Rate:  [75-93] 80 (10/06 0800) Resp:  [18-27] 21 (10/06 0800) BP: (96-124)/(43-63) 119/51 mmHg (10/06 0800) SpO2:  [98 %-100 %] 98 % (10/06 0800) Arterial Line BP: (92)/(63) 92/63 mmHg (10/05 1000) FiO2 (%):  [40 %] 40 % (10/06 0900) Weight:  [174 lb 13.2 oz (79.3 kg)] 174 lb 13.2 oz (79.3 kg) (10/06 0500) HEMODYNAMICS: CVP:  [1 mmHg-11 mmHg] 1 mmHg VENTILATOR SETTINGS: Vent Mode:  [-] PSV;CPAP FiO2 (%):  [40 %] 40 % Set Rate:  [16 bmp] 16 bmp Vt Set:  [390 mL] 390 mL PEEP:  [5 cmH20] 5 cmH20 Pressure Support:  [5 cmH20-10 cmH20] 5 cmH20 Plateau Pressure:  [16 cmH20-20 cmH20] 20 cmH20 INTAKE / OUTPUT:  Intake/Output Summary (Last 24 hours) at 04/25/15 0945 Last data filed at 04/25/15 0800  Gross per 24 hour  Intake   3240 ml  Output   2830 ml  Net    410 ml    PHYSICAL EXAMINATION: General: ill appearing Neuro:  Awake, responsive. No focal deficits.  Cardiovascular:  S1, S2, No MRG. Lungs:  Clear antr Abdomen:  Soft, non  tender Musculoskeletal:  1+ edema Skin:  Diffuse mottling in legs and arms. Cool to touch.  LABS:  CBC  Recent Labs Lab 04/23/15 0424 04/24/15 0455 04/25/15 0534  WBC 7.6 7.8 9.0  HGB 12.2 9.5* 9.4*  HCT 36.0 27.6* 28.5*  PLT 61* 72* 82*   Coag's No results for input(s): APTT, INR in the last 168 hours. BMET  Recent Labs Lab 04/23/15 1236 04/24/15 0455 04/25/15 0534  NA 131* 131* 133*  K 3.7 3.2* 3.3*  CL 100* 100* 99*  CO2 18* 22 25  BUN 17 23* 24*  CREATININE 1.06* 1.01* 0.86  GLUCOSE 186* 125* 119*   Electrolytes  Recent Labs Lab 04/23/15 0424 04/23/15 1236 04/24/15 0455 04/25/15 0534  CALCIUM 7.0* 7.3* 7.0* 7.3*  MG 2.3  --  2.0 1.7  PHOS 4.2  --  3.3 2.8     Sepsis Markers  Recent Labs Lab 04/21/15 1245 04/21/15 1430 04/22/15 0450 04/22/15 0921 04/23/15 0424  LATICACIDVEN  --  2.1*  --  2.0  --   PROCALCITON 0.70  --  4.90  --  4.71   ABG  Recent Labs Lab 04/21/15 2110 04/22/15 0345 04/22/15 0923  PHART 7.513* 7.530* 7.391  PCO2ART 27.9* 24.9* 33.3*  PO2ART 79.0* 64.8* 122.0*   Liver Enzymes  Recent Labs Lab 04/25/15 0534  ALBUMIN 1.4*   Cardiac Enzymes No results for input(s): TROPONINI, PROBNP in the last  168 hours.   Glucose  Recent Labs Lab 04/24/15 1237 04/24/15 1657 04/24/15 1917 04/25/15 0006 04/25/15 0406 04/25/15 0748  GLUCAP 138* 122* 99 125* 129* 115*    Imaging reviewed CXR (10/6)- Increase in RLL opacity. Fluid vs pneumonia MRI head (10/6)- No acute intracranial pathology.  ASSESSMENT / PLAN:  PULMONARY A: Acute hypoxic respiratory failure. Rt lung nodule. Pleural effusions. P:   Continue vent support. SBT going well today. Will extubate.  Will need a biopsy of the RLL opacity at some point when stable for work up of malignancy.  CARDIOVASCULAR Lt IJ CVL >>  A:  Pericardial effusion with tamponade s/p window. Hx of diastolic CHF, CAD, HLD. NSVT and torsades- No more episodes since  10/3 P:  Monitor hemodynamics Continue stress dose steroids as the cortisol level is low.  RENAL A:   AKI. Hypokalemia. P:   Monitor renal fx Replace electrolytes as needed  GASTROINTESTINAL A:   Nutrition. P:   Continue tube feeds  HEMATOLOGIC A:   Hx of splenectomy, MGUS, breast cancer. Thrombocytopenia- may be secondary to stress, sepsis P:  F/u CBC  INFECTIOUS A:   HCAP. P:   Day 11 Abx >> change to primaxin, continue vancomycin Check procalcitonin tomorrow. If low we can D/C abx  Blood 10/02 >> Urine 10/02 >> Sputum 10/02 >>  ENDOCRINE A:   Adrenal insufficiency. Necrotic adrenals seen on recent biopsy P:   Continue stress dose steroids. Can switch to prednisone tomorrow after extubation.   NEUROLOGIC A:   Acute encephalopathy. P:   Monitor mental status Hold lexapro  Daughter [name] updated over the phone [phone]) 10/6.   CC time 35 minutes.  Marshell Garfinkel MD [facility] Pager [phone] If no answer or after 3pm call: [phone] 04/25/2015, 9:45 AM

## 2015-04-25 NOTE — Progress Notes (Signed)
[facility] DAILY ICU PROGRESS NOTE                   [address]            [place] [address]          [phone]   9 Days Post-Op Procedure(s) (LRB): SUBXYPHOID PERICARDIAL WINDOW (N/A) CHEST TUBE INSERTION (Left) TRANSESOPHAGEAL ECHOCARDIOGRAM (TEE) (N/A)  Total Length of Stay:  LOS: 11 days   Subjective:  Patient intubated, alert, follows some commands  Objective: Vital signs in last 24 hours: Temp:  [97 F (36.1 C)-98.1 F (36.7 C)] 97 F (36.1 C) (10/06 0700) Pulse Rate:  [75-93] 80 (10/06 0800) Cardiac Rhythm:  [-] Normal sinus rhythm (10/06 0800) Resp:  [18-27] 21 (10/06 0800) BP: (96-124)/(43-63) 119/51 mmHg (10/06 0800) SpO2:  [98 %-100 %] 98 % (10/06 0800) Arterial Line BP: (92-113)/(49-63) 92/63 mmHg (10/05 1000) FiO2 (%):  [40 %] 40 % (10/06 0404) Weight:  [174 lb 13.2 oz (79.3 kg)] 174 lb 13.2 oz (79.3 kg) (10/06 0500)  Filed Weights   04/23/15 0500 04/24/15 0300 04/25/15 0500  Weight: 163 lb 7 oz (74.135 kg) 156 lb 12 oz (71.1 kg) 174 lb 13.2 oz (79.3 kg)    Weight change: 18 lb 1.3 oz (8.2 kg)   Hemodynamic parameters for last 24 hours: CVP:  [1 mmHg-11 mmHg] 1 mmHg  Intake/Output from previous day: 10/05 0701 - 10/06 0700 In: 3350 [NG/GT:2690; IV Piggyback:500] Out: 2695 [VFIEP:3295; Chest Tube:230]  Intake/Output this shift: Total I/O In: 50 [NG/GT:50] Out: 175 [Urine:125; Chest Tube:50]  Current Meds: Scheduled Meds: . antiseptic oral rinse  7 mL Mouth Rinse 10 times per day  . chlorhexidine gluconate  15 mL Mouth Rinse BID  . folic acid  2 mg Intravenous Daily  . Gerhardt's butt cream   Topical Daily  . hydrocortisone sod succinate (SOLU-CORTEF) inj  100 mg Intravenous Q12H  . imipenem-cilastatin  500 mg Intravenous Q8H  . pantoprazole (PROTONIX) IV  40 mg Intravenous Q24H  . potassium chloride  20 mEq Per Tube Q4H  . raloxifene  60 mg Oral q morning - 10a  . sodium chloride  1,000 mL Intravenous Once  . sodium  chloride  10-40 mL Intracatheter Q12H  . vancomycin  1,000 mg Intravenous Q24H   Continuous Infusions: . feeding supplement (VITAL AF 1.2 CAL) 1,000 mL (04/25/15 0800)  . norepinephrine (LEVOPHED) Adult infusion Stopped (04/23/15 1842)  . vasopressin (PITRESSIN) infusion - *FOR SHOCK* Stopped (04/23/15 2330)   PRN Meds:.Place/Maintain arterial line **AND** sodium chloride, acetaminophen (TYLENOL) oral liquid 160 mg/5 mL, acetaminophen, fentaNYL (SUBLIMAZE) injection, levalbuterol, midazolam, midazolam, sodium chloride  General appearance: alert, cooperative and no distress Heart: regular rate and rhythm Lungs: clear to auscultation bilaterally Abdomen: soft, non-tender; bowel sounds normal; no masses,  no organomegaly Extremities: edema all extremities, mottling in extremities, vesicles along extremities Wound: clean and dry  Lab Results: CBC: Recent Labs  04/24/15 0455 04/25/15 0534  WBC 7.8 9.0  HGB 9.5* 9.4*  HCT 27.6* 28.5*  PLT 72* 82*   BMET:  Recent Labs  04/24/15 0455 04/25/15 0534  NA 131* 133*  K 3.2* 3.3*  CL 100* 99*  CO2 22 25  GLUCOSE 125* 119*  BUN 23* 24*  CREATININE 1.01* 0.86  CALCIUM 7.0* 7.3*    PT/INR: No results for input(s): LABPROT, INR in the last 72 hours. Radiology: Mr Angiogram Neck W Wo Contrast  04/25/2015   EXAM: MRI HEAD WITHOUT  AND WITH CONTRAST  MRA HEAD WITHOUT AND WITH CONTRAST  TECHNIQUE: Multiplanar, multiecho pulse sequences of the brain and surrounding structures were obtained without and with intravenous contrast. Angiographic images of the head were obtained using MRA technique without and with contrast.  CONTRAST:  33mL MULTIHANCE GADOBENATE DIMEGLUMINE 529 MG/ML IV SOLN  COMPARISON:  Prior CT from 04/21/2015.  FINDINGS: MRI HEAD FINDINGS  Study moderately degraded by motion artifact.  No abnormal foci of restricted diffusion to suggest acute intracranial infarct. Gray-white matter differentiation maintained. Normal intravascular  flow voids grossly preserved. No acute or chronic intracranial hemorrhage.  Cerebral volume within normal limits for patient age. Minimal T2/FLAIR hyperintensity within the periventricular white matter, nonspecific, but likely related to minimal chronic small vessel ischemic disease, also felt to be normal for patient age.  No mass lesion, midline shift, or mass effect. No hydrocephalus. No extra-axial fluid collection. No definite abnormal enhancement on post-contrast sequences, although evaluation limited by motion artifact. Somewhat serpiginous area of enhancement within the right subinsular region on coronal postcontrast sequence favored to be artifactual in nature, as this is not seen on corresponding axial sequence. Moreover, no other signal abnormality seen within this region on corresponding FLAIR or T2 weighted images.  Craniocervical junction within normal limits. Mild degenerative spondylolysis within the visualized upper cervical spine.  Pituitary gland normal.  No acute abnormality about the orbits.  Mild mucosal thickening within ethmoidal air cells and maxillary sinuses. No air-fluid levels to suggest active sinusitis. Scattered fluid opacity within the bilateral mastoid air cells. Inner ear structures grossly normal.  Bone marrow signal intensity within normal limits. Scalp soft tissues unremarkable.  MRA HEAD FINDINGS  Evaluation limited by motion artifact. On the post gadolinium reconstructed images, the visualized aortic arch is of normal caliber with normal branch pattern. No high-grade stenosis at the origin of the great vessels. The visualized subclavian arteries are widely patent.  Right common carotid artery widely patent from its origin to the carotid bifurcation. Right ICA patent to the circle of Willis without focal stenosis or occlusion. Right external carotid artery and its branches grossly normal.  Left common carotid artery patent from its origin to the carotid bifurcation. Left ICA  widely patent from the carotid bifurcation to the circle of Willis. No focal stenosis or occlusion. Left external carotid artery and its branches grossly normal.  Both vertebral arteries arise from the subclavian arteries. Origin of the vertebral arteries not well evaluated. Vertebral arteries demonstrate antegrade flow to the basilar artery without focal stenosis or occlusion.  IMPRESSION: MRI BRAIN IMPRESSION:  1. Motion degraded study. 2. No acute intracranial process. No evidence for intracranial metastasis or other abnormality.  MRA NECK IMPRESSION:  1. Motion degraded study. 2. Grossly normal MRA of the neck. No high-grade stenosis or other acute abnormality identified within the major arterial vasculature of the neck.   Electronically Signed   By: Jeannine Boga M.D.   On: 04/25/2015 04:45   Mr Jeri Cos GY Contrast  04/25/2015   EXAM: MRI HEAD WITHOUT AND WITH CONTRAST  MRA HEAD WITHOUT AND WITH CONTRAST  TECHNIQUE: Multiplanar, multiecho pulse sequences of the brain and surrounding structures were obtained without and with intravenous contrast. Angiographic images of the head were obtained using MRA technique without and with contrast.  CONTRAST:  36mL MULTIHANCE GADOBENATE DIMEGLUMINE 529 MG/ML IV SOLN  COMPARISON:  Prior CT from 04/21/2015.  FINDINGS: MRI HEAD FINDINGS  Study moderately degraded by motion artifact.  No abnormal foci of restricted diffusion  to suggest acute intracranial infarct. Gray-white matter differentiation maintained. Normal intravascular flow voids grossly preserved. No acute or chronic intracranial hemorrhage.  Cerebral volume within normal limits for patient age. Minimal T2/FLAIR hyperintensity within the periventricular white matter, nonspecific, but likely related to minimal chronic small vessel ischemic disease, also felt to be normal for patient age.  No mass lesion, midline shift, or mass effect. No hydrocephalus. No extra-axial fluid collection. No definite abnormal  enhancement on post-contrast sequences, although evaluation limited by motion artifact. Somewhat serpiginous area of enhancement within the right subinsular region on coronal postcontrast sequence favored to be artifactual in nature, as this is not seen on corresponding axial sequence. Moreover, no other signal abnormality seen within this region on corresponding FLAIR or T2 weighted images.  Craniocervical junction within normal limits. Mild degenerative spondylolysis within the visualized upper cervical spine.  Pituitary gland normal.  No acute abnormality about the orbits.  Mild mucosal thickening within ethmoidal air cells and maxillary sinuses. No air-fluid levels to suggest active sinusitis. Scattered fluid opacity within the bilateral mastoid air cells. Inner ear structures grossly normal.  Bone marrow signal intensity within normal limits. Scalp soft tissues unremarkable.  MRA HEAD FINDINGS  Evaluation limited by motion artifact. On the post gadolinium reconstructed images, the visualized aortic arch is of normal caliber with normal branch pattern. No high-grade stenosis at the origin of the great vessels. The visualized subclavian arteries are widely patent.  Right common carotid artery widely patent from its origin to the carotid bifurcation. Right ICA patent to the circle of Willis without focal stenosis or occlusion. Right external carotid artery and its branches grossly normal.  Left common carotid artery patent from its origin to the carotid bifurcation. Left ICA widely patent from the carotid bifurcation to the circle of Willis. No focal stenosis or occlusion. Left external carotid artery and its branches grossly normal.  Both vertebral arteries arise from the subclavian arteries. Origin of the vertebral arteries not well evaluated. Vertebral arteries demonstrate antegrade flow to the basilar artery without focal stenosis or occlusion.  IMPRESSION: MRI BRAIN IMPRESSION:  1. Motion degraded study. 2.  No acute intracranial process. No evidence for intracranial metastasis or other abnormality.  MRA NECK IMPRESSION:  1. Motion degraded study. 2. Grossly normal MRA of the neck. No high-grade stenosis or other acute abnormality identified within the major arterial vasculature of the neck.   Electronically Signed   By: Jeannine Boga M.D.   On: 04/25/2015 04:45   Dg Chest Port 1 View  04/25/2015   CLINICAL DATA:  Acute respiratory failure, pneumonia, ventilated patient, sepsis, history of CHF  EXAM: PORTABLE CHEST 1 VIEW  COMPARISON:  Portable chest x-ray of April 24, 2015  FINDINGS: The left lung is well-expanded and clear. On the right further opacity of the lower half of the hemi thorax has developed. Some aerated lower lung persists. The heart is top-normal in size. The pulmonary vascularity is only minimally prominent.  The left chest tube tip projects over the posterior aspect of the left fourth rib. There is no pneumothorax or pleural effusion. On the right there is a moderate-sized pleural effusion. The endotracheal tube tip lies approximately 5 cm above the carina. The esophagogastric tube tip projects below the inferior margin of the image. The left internal jugular venous catheter tip projects over the junction of the proximal and midportions of the SVC.  IMPRESSION: Slight interval worsening in the appearance of the right lower hemi thorax consistent with atelectasis -pneumonia and  pleural effusion. The left lung is clear with no evidence of a pneumothorax or pleural effusion. The support tubes are in reasonable position.   Electronically Signed   By: David  Martinique M.D.   On: 04/25/2015 07:34     Assessment/Plan: S/P Procedure(s) (LRB): SUBXYPHOID PERICARDIAL WINDOW (N/A) CHEST TUBE INSERTION (Left) TRANSESOPHAGEAL ECHOCARDIOGRAM (TEE) (N/A)  1. Chest tube- output is decreasing, down to 230 cc output yesterday- will leave in place while extubated 2. Pulm- weaning to extubate as  tolerated, CXR with worsening atelectasis/effusion on the right 3. Dispo- care per CCM     Ashlynd Michna 04/25/2015 8:12 AM

## 2015-04-25 NOTE — Progress Notes (Signed)
[facility] ADULT ICU REPLACEMENT PROTOCOL FOR AM LAB REPLACEMENT ONLY  The patient does apply for the [facility] Adult ICU Electrolyte Replacment Protocol based on the criteria listed below:   1. Is GFR >/= 40 ml/min? Yes.    Patient's GFR today is >60 2. Is urine output >/= 0.5 ml/kg/hr for the last 6 hours? Yes.   Patient's UOP is 1.05 ml/kg/hr 3. Is BUN < 60 mg/dL? Yes.    Patient's BUN today is 24 4. Abnormal electrolyte(s): Potassium 3.3 5. Ordered repletion with: Potassium per protocol 6. If a panic level lab has been reported, has the CCM MD in charge been notified? No..   Physician:    Adam Phenix 04/25/2015 6:44 AM

## 2015-04-25 NOTE — Progress Notes (Signed)
[name] [name] is still intubated. She had an MRI/MRA  Of the brain yesterday. This doesn't seem to be remarkable. There is no metastasis. She's had no obvious cerebrovascular accident.  All cultures are negative.  All of her cytologies and biopsies have come back negative for any obvious malignancy.  Her  Platelet count is trending upward. Today, it is 82,000. Her hemoglobin is 9.4. White cell count is 9.  She still has a lot of mottling. I do not know if she threw emboli..  She's had no obvious bleeding.  Her vital signs look pretty good. Her blood pressure is 117/52. Temperature 98.1. Pulse is 81. She still has the chest tube in the left chest wall. It is draining yellowish fluid.  An echo cardiogram done over the weekend did not show any obvious valve vegetations. She had good wall motion and good ejection fraction.  I still have to  Believe that there is some underlying malignancy although we've not be able to document this yet by tissue biopsy. I do not think, however, that this would be the source of her decline.  I do appreciate everybody's help trying to take care of her in 2S!!  [name]  2 Thessalonians 3:3

## 2015-04-25 NOTE — Care Management Important Message (Signed)
Important Message  Patient Details  Name: [name] [name] MRN: [id number] Date of Birth: [birth year]   Medicare Important Message Given:  Yes-fourth notification given    Nathen May 04/25/2015, 11:10 AM

## 2015-04-25 NOTE — Procedures (Signed)
Pt transported to MRI by pt's RN, RT and transporter.  RN and RT stayed with pt until MRI was complete.  [name] then transported back to room without any complications.

## 2015-04-26 ENCOUNTER — Ambulatory Visit: Payer: Medicare Other | Admitting: Family

## 2015-04-26 ENCOUNTER — Inpatient Hospital Stay (HOSPITAL_COMMUNITY): Payer: Medicare Other

## 2015-04-26 ENCOUNTER — Ambulatory Visit: Payer: Medicare Other

## 2015-04-26 ENCOUNTER — Other Ambulatory Visit: Payer: Medicare Other

## 2015-04-26 DIAGNOSIS — E2740 Unspecified adrenocortical insufficiency: Secondary | ICD-10-CM

## 2015-04-26 DIAGNOSIS — R238 Other skin changes: Secondary | ICD-10-CM

## 2015-04-26 DIAGNOSIS — D649 Anemia, unspecified: Secondary | ICD-10-CM

## 2015-04-26 LAB — CULTURE, BLOOD (ROUTINE X 2)
CULTURE: NO GROWTH
CULTURE: NO GROWTH

## 2015-04-26 LAB — BASIC METABOLIC PANEL
ANION GAP: 8 (ref 5–15)
BUN: 21 mg/dL — ABNORMAL HIGH (ref 6–20)
CO2: 26 mmol/L (ref 22–32)
Calcium: 7.3 mg/dL — ABNORMAL LOW (ref 8.9–10.3)
Chloride: 105 mmol/L (ref 101–111)
Creatinine, Ser: 0.65 mg/dL (ref 0.44–1.00)
Glucose, Bld: 85 mg/dL (ref 65–99)
POTASSIUM: 3.6 mmol/L (ref 3.5–5.1)
SODIUM: 139 mmol/L (ref 135–145)

## 2015-04-26 LAB — CBC
HCT: 25.7 % — ABNORMAL LOW (ref 36.0–46.0)
HEMOGLOBIN: 8.7 g/dL — AB (ref 12.0–15.0)
MCH: 30.5 pg (ref 26.0–34.0)
MCHC: 33.9 g/dL (ref 30.0–36.0)
MCV: 90.2 fL (ref 78.0–100.0)
PLATELETS: 95 10*3/uL — AB (ref 150–400)
RBC: 2.85 MIL/uL — AB (ref 3.87–5.11)
RDW: 16.4 % — ABNORMAL HIGH (ref 11.5–15.5)
WBC: 9.7 10*3/uL (ref 4.0–10.5)

## 2015-04-26 LAB — GLUCOSE, CAPILLARY
GLUCOSE-CAPILLARY: 97 mg/dL (ref 65–99)
GLUCOSE-CAPILLARY: 91 mg/dL (ref 65–99)
GLUCOSE-CAPILLARY: 111 mg/dL — AB (ref 65–99)
GLUCOSE-CAPILLARY: 106 mg/dL — AB (ref 65–99)
GLUCOSE-CAPILLARY: 97 mg/dL (ref 65–99)
Glucose-Capillary: 95 mg/dL (ref 65–99)

## 2015-04-26 LAB — PROCALCITONIN: PROCALCITONIN: 1 ng/mL

## 2015-04-26 LAB — MAGNESIUM: MAGNESIUM: 1.6 mg/dL — AB (ref 1.7–2.4)

## 2015-04-26 LAB — PHOSPHORUS: Phosphorus: 2.8 mg/dL (ref 2.5–4.6)

## 2015-04-26 MED ORDER — POTASSIUM CHLORIDE 10 MEQ/50ML IV SOLN
10.0000 meq | INTRAVENOUS | Status: AC
  Administered 2015-04-26 (×2): 10 meq via INTRAVENOUS
  Filled 2015-04-26 (×2): qty 50

## 2015-04-26 MED ORDER — MIDAZOLAM HCL 2 MG/2ML IJ SOLN
INTRAMUSCULAR | Status: AC
  Administered 2015-04-26: 4 mg via INTRAVENOUS
  Filled 2015-04-26: qty 4

## 2015-04-26 MED ORDER — DOPAMINE-DEXTROSE 3.2-5 MG/ML-% IV SOLN
INTRAVENOUS | Status: AC
  Administered 2015-04-27: 10 ug/kg/min via INTRAVENOUS
  Filled 2015-04-26: qty 250

## 2015-04-26 MED ORDER — HYDROCORTISONE NA SUCCINATE PF 100 MG IJ SOLR
50.0000 mg | Freq: Two times a day (BID) | INTRAMUSCULAR | Status: DC
  Administered 2015-04-26: 50 mg via INTRAVENOUS
  Filled 2015-04-26 (×3): qty 1

## 2015-04-26 MED ORDER — FENTANYL CITRATE (PF) 100 MCG/2ML IJ SOLN
INTRAMUSCULAR | Status: AC
  Administered 2015-04-26: 50 ug via INTRAVENOUS
  Filled 2015-04-26: qty 2

## 2015-04-26 MED ORDER — ACETYLCYSTEINE 20 % IN SOLN
2.0000 mL | Freq: Three times a day (TID) | RESPIRATORY_TRACT | Status: DC
  Filled 2015-04-26 (×4): qty 4

## 2015-04-26 MED ORDER — ROCURONIUM BROMIDE 50 MG/5ML IV SOLN
1.0000 mg/kg | Freq: Once | INTRAVENOUS | Status: AC
  Administered 2015-04-26: 70 mg via INTRAVENOUS
  Filled 2015-04-26: qty 7.66

## 2015-04-26 MED ORDER — ETOMIDATE 2 MG/ML IV SOLN
0.3000 mg/kg | Freq: Once | INTRAVENOUS | Status: AC
  Administered 2015-04-26: 20 mg via INTRAVENOUS
  Filled 2015-04-26: qty 11.49

## 2015-04-26 MED ORDER — MIDAZOLAM HCL 2 MG/2ML IJ SOLN
4.0000 mg | Freq: Once | INTRAMUSCULAR | Status: AC
  Administered 2015-04-26: 4 mg via INTRAVENOUS

## 2015-04-26 MED ORDER — GUAIFENESIN ER 600 MG PO TB12
600.0000 mg | ORAL_TABLET | Freq: Two times a day (BID) | ORAL | Status: DC
  Administered 2015-04-29: 600 mg via ORAL
  Filled 2015-04-26 (×6): qty 1

## 2015-04-26 MED ORDER — FENTANYL CITRATE (PF) 100 MCG/2ML IJ SOLN
100.0000 ug | Freq: Once | INTRAMUSCULAR | Status: AC
  Administered 2015-04-26: 50 ug via INTRAVENOUS

## 2015-04-26 MED ORDER — MAGNESIUM SULFATE 2 GM/50ML IV SOLN
2.0000 g | Freq: Once | INTRAVENOUS | Status: AC
  Administered 2015-04-26: 2 g via INTRAVENOUS
  Filled 2015-04-26: qty 50

## 2015-04-26 NOTE — Evaluation (Signed)
Clinical/Bedside Swallow Evaluation Patient Details  Name: [name] [name] MRN: [id number] Date of Birth: 06-26-1935  Today's Date: 04/26/2015 Time: SLP Start Time (ACUTE ONLY): 0845 SLP Stop Time (ACUTE ONLY): 0915 SLP Time Calculation (min) (ACUTE ONLY): 30 min  Past Medical History:  Past Medical History  Diagnosis Date  . Abnormal WBC count     low, h/o  . Other and unspecified hyperlipidemia   . Unspecified essential hypertension   . OP (osteoporosis)   . Vitamin D deficiency   . MGUS (monoclonal gammopathy of unknown significance) 03/16/2013  . Back pain   . Breast cancer 1981    mastectomy and lymph node removal  . DM type 2 (diabetes mellitus, type 2)     Pt states she is 'prediabetic'   Past Surgical History:  Past Surgical History  Procedure Laterality Date  . Mastectomy      left  . Breast biopsy      right  . Breast reconstruction    . Skin cancer removal      basal cell skin cancer  . Wisdom tooth extraction    . Tooth extraction    . Laparoscopic splenectomy N/A 04/05/2015    Procedure: LAPAROSCOPIC SPLENECTOMY;  Surgeon: Johnathan Hausen, MD;  Location: [facility];  Service: General;  Laterality: N/A;  . Subxyphoid pericardial window N/A 03/30/2015    Procedure: SUBXYPHOID PERICARDIAL WINDOW;  Surgeon: Ivin Poot, MD;  Location: [facility];  Service: Thoracic;  Laterality: N/A;  . Chest tube insertion Left 03/24/2015    Procedure: CHEST TUBE INSERTION;  Surgeon: Ivin Poot, MD;  Location: [facility];  Service: Thoracic;  Laterality: Left;  . 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 Pt admitted with fever, cough and congestion. CXR no right pleural fluid seen, moderate-sized left pleural effusion and left basilar atelectasis or pneumonia. Chest CT enlarging right lower lobe nodule, 2.3 cm compared with 1.8 cm previously, stable bilateral adrenal masses.   Assessment / Plan / Recommendation Clinical Impression  Patient presents with a mild oral-pharyngeal dysphagia characterized by decreased mastication and bolus manipulation and transit in oral cavity with puree solids and regular solids, and observation of delayed cough response when taking successive straw sips of thin liquids. Patient exhibited a hoarse voice with low vocal intensity prior to, during and after taking P.O.'s and cued and spontaneous cough sounded dry, deep in chest , and resulted in expectoration of mild amount of clear-white pharyngeal secretions. Patient 's SpO2 percentage was maintained at 94-96% throughout this assessment, respiratory rate remained steady at 20-21, and no significant changes in heart rate observed. Patient reported no discomfort when swallowing, and clinician did not observe change in vocal quality after administration of P.O.'s.     Aspiration Risk  Mild    Diet Recommendation Thin;Dysphagia 3 (Mech soft)   Medication Administration: Whole meds with puree Compensations: Small sips/bites;Slow rate    Other  Recommendations Oral Care Recommendations: Oral care BID;Other (Comment) (check oral cavity after P.O.'s and provide oral care as needed)   Follow Up Recommendations  Frequency and Duration min 2x/week  1 week   Pertinent Vitals/Pain     SLP Swallow Goals  Patient will tolerate Dysphagia 3 (mech soft) solids with no overt s/s of aspiration. Patient will utilize swallow safety strategies with minimal assistance.    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 Pt admitted with fever, cough and congestion. CXR no right pleural fluid seen, moderate-sized left pleural effusion and left basilar atelectasis or pneumonia. Chest CT enlarging right lower lobe nodule, 2.3 cm compared with 1.8 cm previously, stable bilateral adrenal masses. Type of Study: Bedside swallow evaluation Previous Swallow Assessment: N/A Diet Prior to this Study: NPO Temperature Spikes Noted: No Respiratory Status: Supplemental O2 delivered via (comment) (nasal cannula) History of Recent Intubation: Yes Length of Intubations (days): 5 days Date extubated: 04/25/15 Behavior/Cognition: Alert;Cooperative;Pleasant mood Oral Cavity - Dentition: Adequate natural dentition/normal for age Self-Feeding Abilities: Needs assist;Needs set up Patient Positioning: Upright in bed Baseline Vocal Quality: Low vocal intensity;Hoarse Volitional Cough: Strong;Congested;Other (Comment) (dry-sounding cough which led to some expectoration of pharyngeal secretions) Volitional Swallow: Able to elicit    Oral/Motor/Sensory Function Overall Oral Motor/Sensory Function: Other (comment) (appeared with generalized weakness) Labial ROM: Within Functional Limits Labial Symmetry: Within Functional Limits Labial Strength: Reduced Lingual ROM: Within Functional Limits Lingual Symmetry: Within Functional Limits Lingual Strength: Within Functional Limits Facial ROM: Within Functional Limits Facial Symmetry: Within Functional Limits Facial Strength: Reduced    Ice Chips Ice chips: Not tested   Thin Liquid Thin Liquid: Impaired Presentation: Cup;Straw Pharyngeal  Phase Impairments: Cough - Delayed;Multiple swallows Other Comments: Delayed cough response after successive straw sips, and patient observed to take multiple swallows with straw sips as well.     Nectar Thick Nectar Thick Liquid: Not tested   Honey Thick Honey Thick Liquid: Not tested   Puree Puree: Impaired Presentation: Spoon Oral Phase Impairments: Impaired anterior to posterior transit Oral Phase Functional Implications: Prolonged oral transit   Solid   GO    Solid: Impaired Oral Phase Impairments: Impaired anterior to posterior transit;Impaired mastication       Nadara Mode Tarrell 04/26/2015,10:56 AM     Sonia Baller, [place], [place] 04/26/2015 10:57 AM Phone: [phone] Fax: [phone]

## 2015-04-26 NOTE — Progress Notes (Signed)
PULMONARY / CRITICAL CARE MEDICINE   Name: [name] [name] MRN: [id number] DOB: Nov 21, 1934    ADMISSION DATE:  04/04/2015  REFERRING MD :  [facility]  CHIEF COMPLAINT:  Cough  INITIAL PRESENTATION:  80 yo admitted with cough and fever from sepsis and HCAP.  She was found to have cardiac tamponade.  She has hx of breast cancer with possible metastatic disease, MGUS.  STUDIES:  9/26  Thoracentesis  9/26  CT chest >> b/l pleural effusions, large pericardial effusion, 2.3 cm RLL nodule (was 1.8 cm) 9/26  ECHO >> tamponade 10/6  MRI Head >> negative for acute pathology   SIGNIFICANT EVENTS: 09/25  Admit, oncology consult 09/26  Cardiology consulted 09/27  TCTS consulted >> subxyphoid pericardial window 09/28  Extubated 10/02  Fever, altered mental status >>To ICU. Intubated 10/03  NSVT and torsades.  10/06  Extubated    SUBJECTIVE:  Pt denies shortness of breath, chest pain.  Notes productive cough with thin secretions.    VITAL SIGNS: Temp:  [97 F (36.1 C)-98.6 F (37 C)] 97.7 F (36.5 C) (10/07 0735) Pulse Rate:  [58-89] 58 (10/07 0800) Resp:  [19-35] 35 (10/07 0800) BP: (95-114)/(37-59) 107/50 mmHg (10/07 0800) SpO2:  [93 %-100 %] 94 % (10/07 0820) Weight:  [168 lb 14 oz (76.6 kg)] 168 lb 14 oz (76.6 kg) (10/07 0600)   HEMODYNAMICS: CVP:  [2 mmHg] 2 mmHg   VENTILATOR SETTINGS:     INTAKE / OUTPUT:  Intake/Output Summary (Last 24 hours) at 04/26/15 0937 Last data filed at 04/26/15 7169  Gross per 24 hour  Intake    670 ml  Output   1760 ml  Net  -1090 ml    PHYSICAL EXAMINATION: General: chronically ill appearing female in NAD Neuro:  Awake, alert, generalized weakness, MAE Cardiovascular:  S1, S2, No MRG. Lungs:  Even/non-labored, lungs bilaterally clear anterior, diminished R lower Abdomen:  Soft, non tender, BSx4 active  Musculoskeletal:  No acute deformities Skin:  Light pink rash to cheeks, nose appears 'vascular' (wasnt there on 9/26 exam by NP), diffuse  purpura on hands, arms, lower extremities that is not blanching    LABS:  CBC  Recent Labs Lab 04/24/15 0455 04/25/15 0534 04/26/15 0520  WBC 7.8 9.0 9.7  HGB 9.5* 9.4* 8.7*  HCT 27.6* 28.5* 25.7*  PLT 72* 82* 95*   Coag's No results for input(s): APTT, INR in the last 168 hours.   BMET  Recent Labs Lab 04/24/15 0455 04/25/15 0534 04/26/15 0520  NA 131* 133* 139  K 3.2* 3.3* 3.6  CL 100* 99* 105  CO2 22 25 26   BUN 23* 24* 21*  CREATININE 1.01* 0.86 0.65  GLUCOSE 125* 119* 85   Electrolytes  Recent Labs Lab 04/24/15 0455 04/25/15 0534 04/26/15 0520  CALCIUM 7.0* 7.3* 7.3*  MG 2.0 1.7 1.6*  PHOS 3.3 2.8 2.8    Sepsis Markers  Recent Labs Lab 04/21/15 1430  04/22/15 0921 04/23/15 0424 04/25/15 1055 04/26/15 0520  LATICACIDVEN 2.1*  --  2.0  --   --   --   PROCALCITON  --   < >  --  4.71 1.82 1.00  < > = values in this interval not displayed.   ABG  Recent Labs Lab 04/21/15 2110 04/22/15 0345 04/22/15 0923  PHART 7.513* 7.530* 7.391  PCO2ART 27.9* 24.9* 33.3*  PO2ART 79.0* 64.8* 122.0*   Liver Enzymes  Recent Labs Lab 04/25/15 0534  ALBUMIN 1.4*   Cardiac Enzymes No results for  input(s): TROPONINI, PROBNP in the last 168 hours.   Glucose  Recent Labs Lab 04/25/15 1131 04/25/15 1600 04/25/15 1932 04/25/15 2340 04/26/15 0338 04/26/15 0734  GLUCAP 105* 105* 88 95 97 91    Imaging reviewed CXR (10/7) - persistent large R pleural effusion with atelectasis, L sided chest tube   ASSESSMENT / PLAN:  PULMONARY A: Acute hypoxic respiratory failure. Rt lung nodule. Pleural effusions - thora 9/26 with negative cultures P:   Pulmonary hygiene: IS, mobilize as able  Will need a biopsy of the RLL opacity at some point when stable for work up of malignancy. Oxygen as needed to support saturations > 92%  CARDIOVASCULAR Lt IJ CVL >>  A:  Pericardial effusion with tamponade s/p window 9/02 Hx of diastolic CHF, CAD, HLD. NSVT  and torsades - none since 10/3 P:  Monitor hemodynamics Transition from solu-cortef to prednisone 10mg  QD 10/7 Chest tube management per CVTS  RENAL A:   AKI. Hypokalemia. Hypomagnesemia  P:   Monitor renal fx Replace electrolytes as needed Mg+ 10/7   GASTROINTESTINAL A:   Nutrition. P:   Dysphagia 3 Diet, thin liquids SLP following  PPI - home medication  HEMATOLOGIC A:   Hx of splenectomy, MGUS, breast cancer. Thrombocytopenia - may be secondary to stress, sepsis P:  Trend CBC  Monitor for bleeding  SCD's for DVT prophylaxis   INFECTIOUS A:   HCAP P:   Day 12 Abx >> change to primaxin, continue vancomycin, d/c 10/7 Monitor off abx  Blood 10/02 >> Urine 10/02 >> neg Sputum 10/02 >> normal flora  ENDOCRINE A:   Adrenal insufficiency. Necrotic adrenals seen on recent biopsy P:   Transition from stress dose steroids to Prednisone 10mg  QD 10/7  AUTOIMMUNE A: R/O Autoimmune source effusion / skin changes, negative malignant work up thus far P: Assess autoimmune panel 10/7  ANCA >> DS DNA >>  SCL70 >> RF >>  SSA/SSB >>   Consider biopsy of left hand lesions  NEUROLOGIC A:   Acute encephalopathy P:   Monitor mental status Hold lexapro   FAMILY:  None at bedside 10/7 am  GOC:  Full code    Noe Gens, NP-C [place] Pgr: [phone] or if no answer [phone] 04/26/2015, 9:37 AM  Attending Note:  I have examined patient, reviewed labs, studies and notes. I have discussed the case with B Ollis, and I agree with the data and plans as amended above.   Baltazar Apo, MD, PhD 04/26/2015, 11:17 AM [facility] [phone] or if no answer [phone]

## 2015-04-26 NOTE — Progress Notes (Addendum)
TCTS DAILY ICU PROGRESS NOTE                   [address]            [place] [address]          [phone]   10 Days Post-Op Procedure(s) (LRB): SUBXYPHOID PERICARDIAL WINDOW (N/A) CHEST TUBE INSERTION (Left) TRANSESOPHAGEAL ECHOCARDIOGRAM (TEE) (N/A)  Total Length of Stay:  LOS: 12 days   Subjective:  [name] [name] is feeling better.  She has thick sputum production with cough... Its whitish in color  Objective: Vital signs in last 24 hours: Temp:  [97 F (36.1 C)-98.6 F (37 C)] 97.7 F (36.5 C) (10/07 0735) Pulse Rate:  [73-89] 84 (10/07 0700) Cardiac Rhythm:  [-] Normal sinus rhythm (10/07 0758) Resp:  [19-27] 25 (10/07 0700) BP: (95-114)/(37-59) 99/49 mmHg (10/07 0700) SpO2:  [93 %-100 %] 95 % (10/07 0700) FiO2 (%):  [40 %] 40 % (10/06 0900) Weight:  [168 lb 14 oz (76.6 kg)] 168 lb 14 oz (76.6 kg) (10/07 0600)  Filed Weights   04/24/15 0300 04/25/15 0500 04/26/15 0600  Weight: 156 lb 12 oz (71.1 kg) 174 lb 13.2 oz (79.3 kg) 168 lb 14 oz (76.6 kg)    Weight change: -5 lb 15.2 oz (-2.7 kg)   Hemodynamic parameters for last 24 hours: CVP:  [2 mmHg-3 mmHg] 2 mmHg  Intake/Output from previous day: 10/06 0701 - 10/07 0700 In: 670 [NG/GT:50; IV Piggyback:500] Out: 6568 [Urine:1375; Stool:50; Chest Tube:240]  Intake/Output this shift: Total I/O In: -  Out: 180 [Urine:30; Chest Tube:150]  Current Meds: Scheduled Meds: . antiseptic oral rinse  7 mL Mouth Rinse BID  . chlorhexidine gluconate  15 mL Mouth Rinse BID  . folic acid  2 mg Intravenous Daily  . Gerhardt's butt cream   Topical Daily  . hydrocortisone sod succinate (SOLU-CORTEF) inj  50 mg Intravenous Q12H  . imipenem-cilastatin  500 mg Intravenous Q8H  . pantoprazole (PROTONIX) IV  40 mg Intravenous Q24H  . potassium chloride  10 mEq Intravenous Q1 Hr x 2  . raloxifene  60 mg Oral q morning - 10a  . sodium chloride  1,000 mL Intravenous Once  . sodium chloride  10-40 mL  Intracatheter Q12H  . vancomycin  1,000 mg Intravenous Q24H   Continuous Infusions: . norepinephrine (LEVOPHED) Adult infusion Stopped (04/23/15 1842)  . vasopressin (PITRESSIN) infusion - *FOR SHOCK* Stopped (04/23/15 2330)   PRN Meds:.Place/Maintain arterial line **AND** sodium chloride, acetaminophen (TYLENOL) oral liquid 160 mg/5 mL, acetaminophen, fentaNYL (SUBLIMAZE) injection, levalbuterol, midazolam, midazolam, sodium chloride  General appearance: alert, cooperative and no distress Heart: regular rate and rhythm Lungs: wheezes bilaterally Abdomen: soft, non-tender; bowel sounds normal; no masses,  no organomegaly Extremities: mottling remains present Wound: chest tube site clean, some serous drainage around chest tube site  Lab Results: CBC: Recent Labs  04/25/15 0534 04/26/15 0520  WBC 9.0 9.7  HGB 9.4* 8.7*  HCT 28.5* 25.7*  PLT 82* 95*   BMET:  Recent Labs  04/25/15 0534 04/26/15 0520  NA 133* 139  K 3.3* 3.6  CL 99* 105  CO2 25 26  GLUCOSE 119* 85  BUN 24* 21*  CREATININE 0.86 0.65  CALCIUM 7.3* 7.3*    PT/INR: No results for input(s): LABPROT, INR in the last 72 hours. Radiology: Dg Chest Port 1 View  04/26/2015   CLINICAL DATA:  80 year old female with shortness of breath and right pleural effusion  EXAM: PORTABLE CHEST 1 VIEW  COMPARISON:  Prior chest x-ray 04/25/2015  FINDINGS: The patient has been extubated and the nasogastric tube has been removed. Stable left IJ central venous catheter with the tip overlying the mid SVC. Left-sided thoracostomy tube is in unchanged position. No evidence of pneumothorax. Large a layering right pleural effusion with right lower and middle lobe atelectasis. Cardiac and mediastinal contours are unchanged. Atherosclerotic calcification again noted in the transverse aorta. No acute osseous abnormality.  IMPRESSION: 1. Interval extubation and removal of nasogastric tube. 2. Other support apparatus in stable and satisfactory  position. 3. Persistent large right pleural effusion with progressive atelectasis now involving the right lower and middle lobes.   Electronically Signed   By: Jacqulynn Cadet M.D.   On: 04/26/2015 07:52     Assessment/Plan: S/P Procedure(s) (LRB): SUBXYPHOID PERICARDIAL WINDOW (N/A) CHEST TUBE INSERTION (Left) TRANSESOPHAGEAL ECHOCARDIOGRAM (TEE) (N/A)  1. Chest tube- output continues to decrease 270 cc output yesterday- will leave chest tube for now 2. Pulm- extubated yesterday, moderate to large right pleural effusion/atelectasis- may benefit from Thoracentesis vs. Chest tube placement 3. Mild Hypokalemia- currently at 3.6, experiencing PVCs... Will order 2 runs of K 4. Dispo- will speak with Dr. Prescott Gum about moderate to large effusion, care per CCM     [name], [name] 04/26/2015 8:04 AM   Chart reviewed, patient examined. Her left chest tube is still draining serous fluid and nurse reports a lot of serous drainage around the tube that is saturating the dressing. Continue chest tube to suction.  CXR this am shows some right pleural effusion which she has had but most of the opacity is likely due to atelectasis of the middle and lower lobes. Continue to encourage IS, coughing. Repeat CXR in the am.

## 2015-04-26 NOTE — Clinical Social Work Note (Signed)
CSW continuing to follow and assist with discharge planning.  Nonnie Done, LCSW [phone]  Psychiatric [facility] Orthopedics (5N 1-8) Clinical Social Worker

## 2015-04-26 NOTE — Progress Notes (Signed)
RT called to room by RN to NTS patient due to large amount of secretions patient was unable to get out on her own. RT NTS patient without any complications using a 35DI catheter, and was able to suction out copious amounts of clear/white secretions.

## 2015-04-26 NOTE — Progress Notes (Signed)
[name] [name] is now extubated. She is off all IVs. I'm not sure exactly what happened but it is a miracle from my point of view.she still is on antibiotics.  She also is on Solu-Cortef. Hopefully, this can be gradually decreased.I will decrease her dose down to 50 mg IV twice a day.  She does have a large bulla on the right arm. She has a lot of ecchymoses on the right arm. The mottling otherwise, seems be improving.  She still has the chest tube in. Hopefully, this can also be discontinued.  Gain, we have not found any obvious malignancy. I still cannot explain the pericardial fluid.. She still has this right lung nodule. Her tumor markers are normal. She still is not in any condition to be able to withstand an invasive procedure. I think we have some "flexibility" to be able to wait for another  Month or so and then repeat a CT scan and see how this nodule appears.   It is hard to say if she has adrenal insufficiency.  She may need some element of  Steroid at a low dose upon discharge.  On her physical exam, her blood pressure is 99/49. She is afebrile. Her pulse is 84. Her lungs are clear on the right side. She mentioned some slight decrease on the left side. Her cardiac exam is regular rate and rhythm with no murmurs rubs or bruits. Abdomen is soft. Bowel sounds are slightly decreased. Her laparoscopy scars from the splenectomy seen in the pretty healed. She still has staples in. Extremities shows the large bulla on the proximal right forearm. She has a large ecchymoses on the right arm. Left arm looks good. There is much less mottling from my point of view.  I'm so thankful that she is improving and that she is now off ventilator support. It is still not clear as to what happened that caused her to be on ventilator support. I think all of her cultures have been negative. We have not uncovered any full malignancy. As such a paraneoplastic syndrome would be very hard to identify..  Her blood count  shows that her hemoglobin is dropping somewhat. I would be  Very liberal with respect to transfusing her if necessary. I think if her hemoglobin gets below 8, I would definitely transfuse her.  The fact that her platelet count is trending up ordered is a good factor from my point of view.  We will continue to follow her along. Again I thank all the great help that she is gotten from the staff on 2S .  as always, the compassion from the staff on the unit is obvious!!  [name] 33:6

## 2015-04-26 NOTE — Progress Notes (Signed)
RT called to patients room to NTS patient a second time. RT suctioned a moderate amount of white/clear secretions without any complications.. RT called the box for further suggestions. [place] returned call and is putting in an order for nebulized mucomyst to help thin secretions.

## 2015-04-26 NOTE — Evaluation (Signed)
Physical Therapy Evaluation Patient Details Name: [name] [name] MRN: [id number] DOB: 11/10/1934 Today's Date: 04/26/2015   History of Present Illness  80 yo female adm 04/16/2015 with ARF/HCAP, recent adm a few days ago for mental status changes and weakness, Pt with cardiac tamponade and s/p pericardial window 9/27; 10/2 AMS with intubation; extubated 10/6 with continued AMS; MRI/MRA brain 10/6 with no acute process; EEG +encephalopathy PMHx: syncope, dCHF, breast CA, lung nodules, depression.  Clinical Impression  Pt admitted with above diagnoses. Pt currently very debilitated with multiple massive blisters on all 4 extremities and UEs weeping. Pt currently with functional limitations due to the deficits listed below (see PT Problem List).  Pt may benefit from skilled PT to increase her strength, independence, and safety with mobility to allow discharge to the venue listed below.    NOTE: pt with wordfinding issues and feel SLP consult for language is appropriate     Follow Up Recommendations SNF    Equipment Recommendations  None recommended by PT    Recommendations for Other Services Speech consult     Precautions / Restrictions Precautions Precautions: Fall Precaution Comments: skin weeping arms>legs      Mobility  Bed Mobility Overal bed mobility: Needs Assistance;+2 for physical assistance Bed Mobility: Rolling Rolling: Max assist         General bed mobility comments: assisted RN with repositioning pt to her other side at end of session; she reaches across body and turns her head to assist  Transfers                    Ambulation/Gait                Stairs            Wheelchair Mobility    Modified Rankin (Stroke Patients Only)       Balance                                             Pertinent Vitals/Pain Pain Assessment: Faces Faces Pain Scale: No hurt    Home Living Family/patient expects to be discharged to::  Skilled nursing facility Living Arrangements: Alone                    Prior Function           Comments: unknown immediately PTA     Hand Dominance   Dominant Hand: Right    Extremity/Trunk Assessment   Upper Extremity Assessment: RUE deficits/detail;LUE deficits/detail;Generalized weakness RUE Deficits / Details: able to lift against gravity 3/5, however MMT deferred due to multiple Large blisters and weeping of skin     LUE Deficits / Details: able to lift against gravity 3/5, however MMT deferred due to multiple Large blisters and weeping of skin   Lower Extremity Assessment: Generalized weakness;RLE deficits/detail;LLE deficits/detail RLE Deficits / Details: at least 3/5 hip flexion; combined hip/knee extension in supine 3+/5; legs mottled, ?bruising, numerous large serous filled blisters LLE Deficits / Details: at least 3/5 hip flexion; combined hip/knee extension in supine 3+/5; legs mottled, ?bruising, numerous large serous filled blisters  Cervical / Trunk Assessment:  (uanble to assess)  Communication   Communication: Expressive difficulties  Cognition Arousal/Alertness: Awake/alert Behavior During Therapy: Flat affect Overall Cognitive Status: Difficult to assess  General Comments      Exercises General Exercises - Lower Extremity Ankle Circles/Pumps: AAROM;Both;5 reps Heel Slides: AROM;Strengthening;Both;5 reps (resisted extension)      Assessment/Plan    PT Assessment Patient needs continued PT services  PT Diagnosis Generalized weakness;Other (comment) (decreased skin integrity x 4 extremities)   PT Problem List Decreased strength;Decreased activity tolerance;Decreased balance;Decreased mobility;Decreased knowledge of use of DME;Decreased skin integrity  PT Treatment Interventions Gait training;Functional mobility training;Therapeutic activities;Therapeutic exercise;DME instruction;Balance training;Patient/family  education   PT Goals (Current goals can be found in the Care Plan section) Acute Rehab PT Goals Patient Stated Goal: pt does not state  PT Goal Formulation: Patient unable to participate in goal setting Time For Goal Achievement: 05/10/15 Potential to Achieve Goals: Fair    Frequency Min 2X/week   Barriers to discharge Decreased caregiver support      Co-evaluation               End of Session Equipment Utilized During Treatment: Oxygen Activity Tolerance: Patient limited by fatigue Patient left: in bed;with call bell/phone within reach;with nursing/sitter in room (RN wanted to leave SCDs off due to blisters) Nurse Communication: Need for lift equipment         Time: 8372-9021 PT Time Calculation (min) (ACUTE ONLY): 28 min   Charges:   PT Evaluation $Initial PT Evaluation Tier I: 1 Procedure PT Treatments $Therapeutic Exercise: 8-22 mins   PT G Codes:        Vannessa Godown 04-28-15, 4:49 PM Pager [phone]

## 2015-04-27 ENCOUNTER — Inpatient Hospital Stay (HOSPITAL_COMMUNITY): Payer: Medicare Other

## 2015-04-27 DIAGNOSIS — R410 Disorientation, unspecified: Secondary | ICD-10-CM

## 2015-04-27 DIAGNOSIS — J9601 Acute respiratory failure with hypoxia: Secondary | ICD-10-CM

## 2015-04-27 LAB — DIC (DISSEMINATED INTRAVASCULAR COAGULATION) PANEL
APTT: 30 s (ref 24–37)
FIBRINOGEN: 148 mg/dL — AB (ref 204–475)
PROTHROMBIN TIME: 20.6 s — AB (ref 11.6–15.2)
SMEAR REVIEW: NONE SEEN

## 2015-04-27 LAB — POCT I-STAT 3, ART BLOOD GAS (G3+)
Acid-Base Excess: 3 mmol/L — ABNORMAL HIGH (ref 0.0–2.0)
Acid-base deficit: 9 mmol/L — ABNORMAL HIGH (ref 0.0–2.0)
BICARBONATE: 23.5 meq/L (ref 20.0–24.0)
Bicarbonate: 24.3 mEq/L — ABNORMAL HIGH (ref 20.0–24.0)
Bicarbonate: 18.7 mEq/L — ABNORMAL LOW (ref 20.0–24.0)
O2 SAT: 67 %
O2 Saturation: 100 %
O2 Saturation: 74 %
PH ART: 7.574 — AB (ref 7.350–7.450)
PO2 ART: 39 mmHg — AB (ref 80.0–100.0)
TCO2: 25 mmol/L (ref 0–100)
TCO2: 25 mmol/L (ref 0–100)
TCO2: 20 mmol/L (ref 0–100)
pCO2 arterial: 26.1 mmHg — ABNORMAL LOW (ref 35.0–45.0)
pCO2 arterial: 37.8 mmHg (ref 35.0–45.0)
pCO2 arterial: 45.6 mmHg — ABNORMAL HIGH (ref 35.0–45.0)
pH, Arterial: 7.411 (ref 7.350–7.450)
pH, Arterial: 7.222 — ABNORMAL LOW (ref 7.350–7.450)
pO2, Arterial: 196 mmHg — ABNORMAL HIGH (ref 80.0–100.0)
pO2, Arterial: 47 mmHg — ABNORMAL LOW (ref 80.0–100.0)

## 2015-04-27 LAB — CBC
HCT: 31.1 % — ABNORMAL LOW (ref 36.0–46.0)
Hemoglobin: 10.1 g/dL — ABNORMAL LOW (ref 12.0–15.0)
MCH: 30 pg (ref 26.0–34.0)
MCHC: 32.5 g/dL (ref 30.0–36.0)
MCV: 92.3 fL (ref 78.0–100.0)
Platelets: 73 10*3/uL — ABNORMAL LOW (ref 150–400)
RBC: 3.37 MIL/uL — ABNORMAL LOW (ref 3.87–5.11)
RDW: 16.5 % — AB (ref 11.5–15.5)
WBC: 5.7 10*3/uL (ref 4.0–10.5)

## 2015-04-27 LAB — BASIC METABOLIC PANEL
ANION GAP: 8 (ref 5–15)
BUN: 21 mg/dL — AB (ref 6–20)
CALCIUM: 7.6 mg/dL — AB (ref 8.9–10.3)
CO2: 25 mmol/L (ref 22–32)
Chloride: 109 mmol/L (ref 101–111)
Creatinine, Ser: 0.88 mg/dL (ref 0.44–1.00)
GFR calc Af Amer: >60 mL/min (ref >60)
GLUCOSE: 84 mg/dL (ref 65–99)
Potassium: 3.7 mmol/L (ref 3.5–5.1)
SODIUM: 142 mmol/L (ref 135–145)

## 2015-04-27 LAB — GLUCOSE, CAPILLARY
GLUCOSE-CAPILLARY: 24 mg/dL — AB (ref 65–99)
GLUCOSE-CAPILLARY: 68 mg/dL (ref 65–99)
GLUCOSE-CAPILLARY: 153 mg/dL — AB (ref 65–99)
Glucose-Capillary: 76 mg/dL (ref 65–99)
Glucose-Capillary: 268 mg/dL — ABNORMAL HIGH (ref 65–99)
Glucose-Capillary: 197 mg/dL — ABNORMAL HIGH (ref 65–99)

## 2015-04-27 LAB — CARBOXYHEMOGLOBIN
Carboxyhemoglobin: 0.9 % (ref 0.5–1.5)
METHEMOGLOBIN: 0.9 % (ref 0.0–1.5)
O2 Saturation: 54 %
Total hemoglobin: 10.1 g/dL — ABNORMAL LOW (ref 12.0–16.0)

## 2015-04-27 LAB — POCT I-STAT 3, VENOUS BLOOD GAS (G3P V)
Acid-base deficit: 10 mmol/L — ABNORMAL HIGH (ref 0.0–2.0)
Bicarbonate: 18.4 mEq/L — ABNORMAL LOW (ref 20.0–24.0)
O2 Saturation: 50 %
PH VEN: 7.191 — AB (ref 7.250–7.300)
TCO2: 20 mmol/L (ref 0–100)
pCO2, Ven: 48.1 mmHg (ref 45.0–50.0)
pO2, Ven: 33 mmHg (ref 30.0–45.0)

## 2015-04-27 LAB — ANTI-SCLERODERMA ANTIBODY

## 2015-04-27 LAB — PROCALCITONIN: PROCALCITONIN: 0.88 ng/mL

## 2015-04-27 LAB — LACTIC ACID, PLASMA
LACTIC ACID, VENOUS: 2.9 mmol/L — AB (ref 0.5–2.0)
Lactic Acid, Venous: 2.4 mmol/L (ref 0.5–2.0)

## 2015-04-27 LAB — SJOGRENS SYNDROME-B EXTRACTABLE NUCLEAR ANTIBODY

## 2015-04-27 LAB — DIC (DISSEMINATED INTRAVASCULAR COAGULATION)PANEL
INR: 1.77 — ABNORMAL HIGH (ref 0.00–1.49)
Platelets: 73 10*3/uL — ABNORMAL LOW (ref 150–400)

## 2015-04-27 LAB — ANTI-DNA ANTIBODY, DOUBLE-STRANDED: DS DNA AB: 5 [IU]/mL (ref 0–9)

## 2015-04-27 LAB — SJOGRENS SYNDROME-A EXTRACTABLE NUCLEAR ANTIBODY

## 2015-04-27 LAB — RHEUMATOID FACTOR: Rhuematoid fact SerPl-aCnc: 30 IU/mL — ABNORMAL HIGH (ref 0.0–13.9)

## 2015-04-27 MED ORDER — NOREPINEPHRINE BITARTRATE 1 MG/ML IV SOLN
0.0000 ug/min | INTRAVENOUS | Status: DC
  Administered 2015-04-27: 2 ug/min via INTRAVENOUS
  Filled 2015-04-27: qty 4

## 2015-04-27 MED ORDER — SODIUM CHLORIDE 0.9 % IV SOLN
25.0000 ug/h | INTRAVENOUS | Status: DC
  Administered 2015-04-27 – 2015-04-28 (×2): 50 ug/h via INTRAVENOUS
  Filled 2015-04-27 (×2): qty 50

## 2015-04-27 MED ORDER — SODIUM CHLORIDE 0.9 % IV BOLUS (SEPSIS)
500.0000 mL | Freq: Once | INTRAVENOUS | Status: AC
  Administered 2015-04-27: 500 mL via INTRAVENOUS

## 2015-04-27 MED ORDER — DEXTROSE 50 % IV SOLN
INTRAVENOUS | Status: AC
  Filled 2015-04-27: qty 50

## 2015-04-27 MED ORDER — VITAL HIGH PROTEIN PO LIQD: 1000.0000 mL | ORAL | Status: DC

## 2015-04-27 MED ORDER — VITAL AF 1.2 CAL PO LIQD
1000.0000 mL | ORAL | Status: DC
  Administered 2015-04-27 – 2015-05-04 (×7): 1000 mL
  Filled 2015-04-27 (×17): qty 1000

## 2015-04-27 MED ORDER — MIDAZOLAM HCL 2 MG/2ML IJ SOLN
1.0000 mg | INTRAMUSCULAR | Status: AC | PRN
  Administered 2015-05-04 (×3): 1 mg via INTRAVENOUS
  Filled 2015-04-27 (×2): qty 2

## 2015-04-27 MED ORDER — DEXTROSE 5 % IV SOLN
0.0000 ug/min | INTRAVENOUS | Status: DC
  Administered 2015-04-27: 6 ug/min via INTRAVENOUS
  Administered 2015-04-27: 35 ug/min via INTRAVENOUS
  Filled 2015-04-27 (×3): qty 16

## 2015-04-27 MED ORDER — SODIUM CHLORIDE 0.9 % IV BOLUS (SEPSIS)
500.0000 mL | Freq: Once | INTRAVENOUS | Status: AC
Start: 2015-04-27 — End: 2015-04-27
  Administered 2015-04-27: 500 mL via INTRAVENOUS

## 2015-04-27 MED ORDER — GLUCAGON HCL RDNA (DIAGNOSTIC) 1 MG IJ SOLR
INTRAMUSCULAR | Status: AC
  Filled 2015-04-27: qty 1

## 2015-04-27 MED ORDER — MIDAZOLAM HCL 2 MG/2ML IJ SOLN: 1.0000 mg | INTRAMUSCULAR | Status: DC | PRN

## 2015-04-27 MED ORDER — SODIUM CHLORIDE 0.9 % IV SOLN
INTRAVENOUS | Status: DC
  Administered 2015-04-27 – 2015-04-30 (×3): via INTRAVENOUS

## 2015-04-27 MED ORDER — EPINEPHRINE HCL 0.1 MG/ML IJ SOSY
PREFILLED_SYRINGE | INTRAMUSCULAR | Status: AC
  Filled 2015-04-27: qty 10

## 2015-04-27 MED ORDER — FENTANYL BOLUS VIA INFUSION
25.0000 ug | INTRAVENOUS | Status: DC | PRN
  Filled 2015-04-27: qty 25

## 2015-04-27 MED ORDER — ANTISEPTIC ORAL RINSE SOLUTION (CORINZ)
7.0000 mL | Freq: Four times a day (QID) | OROMUCOSAL | Status: DC
  Administered 2015-04-27 – 2015-05-05 (×35): 7 mL via OROMUCOSAL

## 2015-04-27 MED ORDER — MAGNESIUM SULFATE 2 GM/50ML IV SOLN
2.0000 g | Freq: Once | INTRAVENOUS | Status: AC
  Administered 2015-04-27: 2 g via INTRAVENOUS
  Filled 2015-04-27: qty 50

## 2015-04-27 MED ORDER — INSULIN ASPART 100 UNIT/ML ~~LOC~~ SOLN
0.0000 [IU] | SUBCUTANEOUS | Status: DC
  Administered 2015-04-27: 2 [IU] via SUBCUTANEOUS
  Administered 2015-04-27: 5 [IU] via SUBCUTANEOUS
  Administered 2015-04-28 (×2): 1 [IU] via SUBCUTANEOUS
  Administered 2015-04-28: 2 [IU] via SUBCUTANEOUS
  Administered 2015-04-28 (×3): 3 [IU] via SUBCUTANEOUS
  Administered 2015-04-28: 2 [IU] via SUBCUTANEOUS
  Administered 2015-04-29 – 2015-04-30 (×4): 1 [IU] via SUBCUTANEOUS
  Administered 2015-04-30: 2 [IU] via SUBCUTANEOUS
  Administered 2015-04-30 (×4): 1 [IU] via SUBCUTANEOUS
  Administered 2015-05-01: 2 [IU] via SUBCUTANEOUS
  Administered 2015-05-01 (×3): 1 [IU] via SUBCUTANEOUS
  Administered 2015-05-01 – 2015-05-02 (×6): 2 [IU] via SUBCUTANEOUS
  Administered 2015-05-02 – 2015-05-03 (×2): 1 [IU] via SUBCUTANEOUS
  Administered 2015-05-03: 2 [IU] via SUBCUTANEOUS
  Administered 2015-05-03: 1 [IU] via SUBCUTANEOUS
  Administered 2015-05-03: 2 [IU] via SUBCUTANEOUS
  Administered 2015-05-03: 1 [IU] via SUBCUTANEOUS
  Administered 2015-05-03 – 2015-05-04 (×6): 2 [IU] via SUBCUTANEOUS
  Administered 2015-05-04: 1 [IU] via SUBCUTANEOUS
  Administered 2015-05-04 – 2015-05-05 (×3): 2 [IU] via SUBCUTANEOUS

## 2015-04-27 MED ORDER — PIPERACILLIN-TAZOBACTAM 3.375 G IVPB
3.3750 g | Freq: Three times a day (TID) | INTRAVENOUS | Status: DC
  Administered 2015-04-27 – 2015-05-05 (×25): 3.375 g via INTRAVENOUS
  Filled 2015-04-27 (×28): qty 50

## 2015-04-27 MED ORDER — DOPAMINE-DEXTROSE 3.2-5 MG/ML-% IV SOLN
0.0000 ug/kg/min | INTRAVENOUS | Status: DC
  Administered 2015-04-27: 10 ug/kg/min via INTRAVENOUS

## 2015-04-27 MED ORDER — CHLORHEXIDINE GLUCONATE 0.12% ORAL RINSE (MEDLINE KIT)
15.0000 mL | Freq: Two times a day (BID) | OROMUCOSAL | Status: DC
  Administered 2015-04-26 – 2015-05-05 (×19): 15 mL via OROMUCOSAL

## 2015-04-27 MED ORDER — LACTATED RINGERS IV BOLUS (SEPSIS)
1000.0000 mL | Freq: Once | INTRAVENOUS | Status: AC
  Administered 2015-04-27: 1000 mL via INTRAVENOUS

## 2015-04-27 MED ORDER — VANCOMYCIN HCL IN DEXTROSE 750-5 MG/150ML-% IV SOLN
750.0000 mg | Freq: Two times a day (BID) | INTRAVENOUS | Status: DC
  Administered 2015-04-27 – 2015-04-28 (×3): 750 mg via INTRAVENOUS
  Filled 2015-04-27 (×4): qty 150

## 2015-04-27 MED ORDER — FENTANYL CITRATE (PF) 100 MCG/2ML IJ SOLN: 50.0000 ug | Freq: Once | INTRAMUSCULAR | Status: DC

## 2015-04-27 MED ORDER — HYDROCORTISONE NA SUCCINATE PF 100 MG IJ SOLR
50.0000 mg | Freq: Four times a day (QID) | INTRAMUSCULAR | Status: DC
  Administered 2015-04-27 – 2015-05-02 (×20): 50 mg via INTRAVENOUS
  Filled 2015-04-27 (×25): qty 1

## 2015-04-27 MED ORDER — SODIUM CHLORIDE 0.9 % IV BOLUS (SEPSIS)
1000.0000 mL | Freq: Once | INTRAVENOUS | Status: AC
  Administered 2015-04-27: 1000 mL via INTRAVENOUS

## 2015-04-27 NOTE — Progress Notes (Signed)
PULMONARY / CRITICAL CARE MEDICINE   Name: [name] [name] MRN: [id number] DOB: 1934/07/22    ADMISSION DATE:  04/04/2015  REFERRING MD :  [facility]  CHIEF COMPLAINT:  Cough  INITIAL PRESENTATION:  80 yo admitted with cough and fever from sepsis and HCAP.  She was found to have cardiac tamponade.  She has hx of breast cancer with possible metastatic disease, MGUS.  STUDIES:  9/26  Thoracentesis  9/26  CT chest >> b/l pleural effusions, large pericardial effusion, 2.3 cm RLL nodule (was 1.8 cm) 9/26  ECHO >> tamponade 10/6  MRI Head >> negative for acute pathology   SIGNIFICANT EVENTS: 09/25  Admit, oncology consult 09/26  Cardiology consulted 09/27  TCTS consulted >> subxyphoid pericardial window 09/28  Extubated 10/02  Fever, altered mental status >>To ICU. Intubated 10/03  NSVT and torsades.  10/06  Extubated  10/07  Reintubated for hypoxemia and AMS  SUBJECTIVE: Reintubated overnight, mottled overnight with high pressor demand.  VITAL SIGNS: Temp:  [97 F (36.1 C)-103.9 F (39.9 C)] 103.9 F (39.9 C) (10/08 0759) Pulse Rate:  [95-132] 105 (10/08 1015) Resp:  [15-48] 23 (10/08 1015) BP: (54-159)/(26-82) 112/44 mmHg (10/08 1010) SpO2:  [91 %-100 %] 100 % (10/08 1015) FiO2 (%):  [70 %-100 %] 70 % (10/08 0808) Weight:  [79.4 kg (175 lb 0.7 oz)] 79.4 kg (175 lb 0.7 oz) (10/08 0600)   HEMODYNAMICS: CVP:  [4 mmHg-14 mmHg] 5 mmHg   VENTILATOR SETTINGS: Vent Mode:  [-] PRVC FiO2 (%):  [70 %-100 %] 70 % Set Rate:  [18 bmp-25 bmp] 22 bmp Vt Set:  [390 mL-520 mL] 390 mL PEEP:  [5 cmH20] 5 cmH20 Plateau Pressure:  [18 cmH20-30 cmH20] 18 cmH20   INTAKE / OUTPUT:  Intake/Output Summary (Last 24 hours) at 04/27/15 1046 Last data filed at 04/27/15 1000  Gross per 24 hour  Intake 762.07 ml  Output   1085 ml  Net -322.93 ml   PHYSICAL EXAMINATION: General: chronically ill appearing female unresponsive Neuro:  Opens eyes but not following commands, does not withdraw to  pain. Cardiovascular:  S1, S2, No MRG. Lungs:  Even/non-labored, coarse BS diffusely. Abdomen:  Soft, non tender, BSx4 active  Musculoskeletal:  No acute deformities Skin:  Diffusely mottled  LABS:  CBC  Recent Labs Lab 04/25/15 0534 04/26/15 0520 04/27/15 0329 04/27/15 0330  WBC 9.0 9.7  --  5.7  HGB 9.4* 8.7*  --  10.1*  HCT 28.5* 25.7*  --  31.1*  PLT 82* 95* 73* 73*   Coag's  Recent Labs Lab 04/27/15 0329  APTT 30  INR 1.77*   BMET  Recent Labs Lab 04/25/15 0534 04/26/15 0520 04/27/15 0330  NA 133* 139 142  K 3.3* 3.6 3.7  CL 99* 105 109  CO2 25 26 25   BUN 24* 21* 21*  CREATININE 0.86 0.65 0.88  GLUCOSE 119* 85 84   Electrolytes  Recent Labs Lab 04/24/15 0455 04/25/15 0534 04/26/15 0520 04/27/15 0330  CALCIUM 7.0* 7.3* 7.3* 7.6*  MG 2.0 1.7 1.6*  --   PHOS 3.3 2.8 2.8  --     Sepsis Markers  Recent Labs Lab 04/22/15 0921  04/25/15 1055 04/26/15 0520 04/27/15 0330 04/27/15 0715  LATICACIDVEN 2.0  --   --   --  2.9* 2.4*  PROCALCITON  --   < > 1.82 1.00 0.88  --   < > = values in this interval not displayed.   ABG  Recent Labs Lab 04/22/15 [phone]  04/27/15 0300 04/27/15 0913  PHART 7.391 7.574* 7.411  PCO2ART 33.3* 26.1* 37.8  PO2ART 122.0* 196.0* 39.0*   Liver Enzymes  Recent Labs Lab 04/25/15 0534  ALBUMIN 1.4*   Cardiac Enzymes No results for input(s): TROPONINI, PROBNP in the last 168 hours.   Glucose  Recent Labs Lab 04/26/15 0734 04/26/15 1212 04/26/15 1838 04/26/15 1944 04/27/15 0722 04/27/15 0731  GLUCAP 91 111* 106* 97 24* 76   Imaging reviewed CXR (10/7) - persistent large R pleural effusion with atelectasis, L sided chest tube  ASSESSMENT / PLAN:  PULMONARY A: Acute hypoxic respiratory failure. Rt lung nodule. Pleural effusions - thora 9/26 with negative cultures P:   Continue full vent support Will need a biopsy of the RLL opacity at some point when stable for work up of malignancy. Oxygen  as needed to support saturations > 92%  CARDIOVASCULAR Lt IJ CVL >>  A:  Pericardial effusion with tamponade s/p window 1/24 Hx of diastolic CHF, CAD, HLD. NSVT and torsades - none since 10/3 P:  Monitor hemodynamics Continue solucortef Chest tube management per CVTS CVP 5, will give a liter bolus.  RENAL A:   AKI. Hypokalemia. Hypomagnesemia  P:   Monitor renal fx Replace electrolytes as needed BMET in AM 1L NS bolus x1.  GASTROINTESTINAL A:   Nutrition. P:   Dysphagia 3 Diet, thin liquids SLP following  PPI - home medication  HEMATOLOGIC A:   Hx of splenectomy, MGUS, breast cancer. Thrombocytopenia - may be secondary to stress, sepsis P:  Trend CBC  Monitor for bleeding  SCD's for DVT prophylaxis   INFECTIOUS A:   HCAP P:   Day 12 Abx >> change to primaxin, continue vancomycin, d/c 10/7 Monitor off abx  Blood 10/02 >> NTD Urine 10/02 >> NTD Sputum 10/02 >> normal flora  ENDOCRINE A:   Adrenal insufficiency. Necrotic adrenals seen on recent biopsy P:   Transition from stress dose steroids to Prednisone 10mg  QD 10/7  AUTOIMMUNE A: R/O Autoimmune source effusion / skin changes, negative malignant work up thus far P: Assess autoimmune panel 10/7  ANCA >> DS DNA >>  SCL70 >> RF >> 30 SSA/SSB >>  IgG low IgA normal  Consider biopsy of left hand lesions if more stable.  NEUROLOGIC A:   Acute encephalopathy P:   Monitor mental status Hold lexapro  FAMILY:  None at bedside 10/7 am  GOC:  Full code  The patient is critically ill with multiple organ systems failure and requires high complexity decision making for assessment and support, frequent evaluation and titration of therapies, application of advanced monitoring technologies and extensive interpretation of multiple databases.   Critical Care Time devoted to patient care services described in this note is  35  Minutes. This time reflects time of care of this signee Dr Jennet Maduro.  This critical care time does not reflect procedure time, or teaching time or supervisory time of PA/NP/Med student/Med Resident etc but could involve care discussion time.  Rush Farmer, M.D. [facility] Pulmonary/Critical Care Medicine. Pager: [phone]. After hours pager: [phone].

## 2015-04-27 NOTE — Progress Notes (Signed)
Sputum sample obtained and sent to lab by RT. 

## 2015-04-27 NOTE — Progress Notes (Signed)
Patients rectal temp 103.9, respiratory-irregular, SR-ST 130s, Dr. [name] paged-orders placed for: NS 500 bolus, levo MAP goal 60, STAT ABG, STAT CVP, blood cultures, vanc, zosyn and sputum culture.  Rowe Pavy, RN

## 2015-04-27 NOTE — Progress Notes (Signed)
Called over to the box to notify CBG reading of 268, no current orders for sliding scale, RN to notify MD.  Rowe Pavy, RN

## 2015-04-27 NOTE — Progress Notes (Signed)
eLink Physician-Brief Progress Note Patient Name: [name] [name] DOB: 1935-02-06 MRN: [id number]   Date of Service  04/27/2015  HPI/Events of Note  Blood glucose = 268.  eICU Interventions  Will order Q 4 hour Accuchecks and sensitive Novolog SSI.      Intervention Category Intermediate Interventions: Hyperglycemia - evaluation and treatment  [name] 04/27/2015, 4:59 PM

## 2015-04-27 NOTE — Progress Notes (Signed)
11 Days Post-Op Procedure(s) (LRB): SUBXYPHOID PERICARDIAL WINDOW (N/A) CHEST TUBE INSERTION (Left) TRANSESOPHAGEAL ECHOCARDIOGRAM (TEE) (N/A) Subjective:  Intubated overnight for respiratory failure. This am she is hypotensive in the 80's on dopamine and high dose levophed. She has temp 103.9 rectal. procalcitonin 0.88. Lactic acid 2.4  Objective: Vital signs in last 24 hours: Temp:  [87.8 F (31 C)-102.9 F (39.4 C)] 87.8 F (31 C) (10/08 0759) Pulse Rate:  [95-128] 110 (10/08 0816) Cardiac Rhythm:  [-] Sinus tachycardia (10/07 2000) Resp:  [15-48] 15 (10/08 0816) BP: (54-159)/(29-82) 77/54 mmHg (10/08 0816) SpO2:  [91 %-100 %] 100 % (10/08 0816) FiO2 (%):  [70 %-100 %] 70 % (10/08 0808) Weight:  [79.4 kg (175 lb 0.7 oz)] 79.4 kg (175 lb 0.7 oz) (10/08 0600)  Hemodynamic parameters for last 24 hours:    Intake/Output from previous day: 10/07 0701 - 10/08 0700 In: 694.4 [P.O.:240; I.V.:114.4; NG/GT:60; IV Piggyback:100] Out: 1090 [Urine:560; Stool:200; Chest Tube:330] Intake/Output this shift:    General appearance: intubated, mottled Heart: regular rate and rhythm, S1, S2 normal, no murmur, click, rub or gallop Lungs: diminished breath sounds RLL Wound: incision ok serous drainage from left chest tube  Lab Results:  Recent Labs  04/26/15 0520 04/27/15 0329 04/27/15 0330  WBC 9.7  --  5.7  HGB 8.7*  --  10.1*  HCT 25.7*  --  31.1*  PLT 95* 73* 73*   BMET:  Recent Labs  04/26/15 0520 04/27/15 0330  NA 139 142  K 3.6 3.7  CL 105 109  CO2 26 25  GLUCOSE 85 84  BUN 21* 21*  CREATININE 0.65 0.88  CALCIUM 7.3* 7.6*    PT/INR:  Recent Labs  04/27/15 0329  LABPROT 20.6*  INR 1.77*   ABG    Component Value Date/Time   PHART 7.411 04/27/2015 0913   HCO3 23.5 04/27/2015 0913   TCO2 25 04/27/2015 0913   ACIDBASEDEF 4.0* 04/22/2015 0923   O2SAT 67.0 04/27/2015 0913   CBG (last 3)   Recent Labs  04/26/15 1944 04/27/15 0722 04/27/15 0731   GLUCAP 97 24* 76   CXR: persistent RLL consolidation/collapse, some right pleural effusion likely.  Assessment/Plan: S/P Procedure(s) (LRB): SUBXYPHOID PERICARDIAL WINDOW (N/A) CHEST TUBE INSERTION (Left) TRANSESOPHAGEAL ECHOCARDIOGRAM (TEE) (N/A)  Acute respiratory failure requiring intubation overnight. She looks septic with hypotension, pressor support, high fever. Keep left chest tube in.  She probably does have some right pleural effusion and may benefit from right chest tube to drain this is she recovers from this current decompensation.   I notice that she had a plasma cortisol level drawn on 10/2 that is 0.6 suggesting Addison's disease and she is not on steroid replacement. I don't know this patient well enough to order this but she may benefit from steroids. CCM is managing.    LOS: 13 days    Gaye Pollack 04/27/2015

## 2015-04-27 NOTE — Progress Notes (Signed)
Dr Oletta Darter notified that Blood cultures had not been obtained due to inability to draw blood peripherally, Dr Oletta Darter said to draw blood cultures from central line.  ABG results called, vent changes made per Dr Oletta Darter. Notified of no UOP, order for NS bolus obtained.

## 2015-04-27 NOTE — Progress Notes (Signed)
Nutrition Follow-up  DOCUMENTATION CODES:   Severe malnutrition in context of acute illness/injury  INTERVENTION:  - Will order Vital AF 1.2 @ 65 mL/hr which provides 1872 kcal (93% estimated needs), 117 grams protein, and 1265 mL free water. Free water flush per MD.  - RD will continue to monitor for needs  NUTRITION DIAGNOSIS:   Inadequate oral intake related to inability to eat as evidenced by NPO status. 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 She was admitted on 9/25 for increased fever, cough and congestion coming from her skilled nursing facility and admitted for sepsis secondary to pneumonia causing acute respiratory failure.  Pt seen for new vent and RD to manage TF. Pt was previously extubated 04/25/15 and diet was advanced to Dysphagia 3, thin liquids per SLP recommendations yesterday (10/7). Pt with ongoing, copious excretions yesterday and was subsequently re-intubated at [facility] today; needs re-estimated using [facility] using weight from 10/7: 76.6 kg as weight today is up to 79.4 kg.  Patient is currently intubated on ventilator support with OGT in place. MV: 9.8 L/min Temp (24hrs), Avg:100.2 F (37.9 C), Min:97 F (36.1 C), Max:103.9 F (39.9 C)  Propofol: none  Pt currently unable to meet needs. TF order as outlined above. Medications reviewed. Labs  reviewed; CBGs: 24-111 mg/dL, BUN elevated, Ca: 7.6 mg/dL.  Drips: Levophed @ 20 mcg/min, Fentanyl @ 50 mcg/hr.  Diet Order:  DIET DYS 3 Room service appropriate?: Yes; Fluid consistency:: Thin  Skin:  Wound (see comment) (DTI to L heel; stage II pressure ulcer to buttocks), chest and abdominal incisions.  Last BM:  10/7  Height:   Ht Readings from Last 1 Encounters:  04/21/15 5' 8.5" (1.74 m)    Weight:   Wt Readings from Last 1 Encounters:  04/27/15 175 lb 0.7 oz (79.4 kg)    Ideal Body Weight:  63.64 kg  BMI:  Body mass index is 26.23 kg/(m^2).  Estimated Nutritional Needs:   Kcal:  2011  Protein:  92-115 grams protein  Fluid:  per MD  EDUCATION NEEDS:   No education needs identified at this time     Jarome Matin, RD, LDN Inpatient Clinical Dietitian Pager # [phone] After hours/weekend pager # [phone]

## 2015-04-27 NOTE — Progress Notes (Addendum)
ANTIBIOTIC CONSULT NOTE - INITIAL  Pharmacy Consult for vancomycin and Zosyn Indication: rule out sepsis/HAP  No Known Allergies  Patient Measurements: Height: 5' 8.5" (174 cm) Weight: 175 lb 0.7 oz (79.4 kg) IBW/kg (Calculated) : 65.05   Vital Signs: Temp: 103.9 F (39.9 C) (10/08 0759) Temp Source: Rectal (10/08 0759) BP: 77/54 mmHg (10/08 0816) Pulse Rate: 110 (10/08 0816) Intake/Output from previous day: 10/07 0701 - 10/08 0700 In: 694.4 [P.O.:240; I.V.:114.4; NG/GT:60; IV Piggyback:100] Out: 1090 [Urine:560; Stool:200; Chest Tube:330] Intake/Output from this shift: Total I/O In: -  Out: 50 [Chest Tube:50]  Labs:  Recent Labs  04/25/15 0534 04/26/15 0520 04/27/15 0329 04/27/15 0330  WBC 9.0 9.7  --  5.7  HGB 9.4* 8.7*  --  10.1*  PLT 82* 95* 73* 73*  CREATININE 0.86 0.65  --  0.88   Estimated Creatinine Clearance: 58.9 mL/min (by C-G formula based on Cr of 0.88). No results for input(s): VANCOTROUGH, VANCOPEAK, VANCORANDOM, GENTTROUGH, GENTPEAK, GENTRANDOM, TOBRATROUGH, TOBRAPEAK, TOBRARND, AMIKACINPEAK, AMIKACINTROU, AMIKACIN in the last 72 hours.   Microbiology:  9/25 blood: neg  9/25 urine: neg x2  9/26 sputum: few streptococcus, beta hemolytic not group A  9/26 pleural fungal stain: ngtd (will exam for 6 wks)  9/27 pleural fluid: neg for malignancy  9/27 AFB: ngtd  9/27 pericardium fluid: neg  9/27 pericardial tissue: neg  10/2 BCx: ngtd  10/2 urine: neg  10/2 sputum: neg  10/5 C-diff - neg  10/8 Sputum>>  10/8 BCx2>>   Medical History: Past Medical History  Diagnosis Date  . Abnormal WBC count     low, h/o  . Other and unspecified hyperlipidemia   . Unspecified essential hypertension   . OP (osteoporosis)   . Vitamin D deficiency   . MGUS (monoclonal gammopathy of unknown significance) 03/16/2013  . Back pain   . Breast cancer 1981    mastectomy and lymph node removal  . DM type 2 (diabetes mellitus, type 2)     Pt states she is  'prediabetic'    Medications:  Prescriptions prior to admission  Medication Sig Dispense Refill Last Dose  . acetaminophen (TYLENOL) 325 MG tablet Take 2 tablets (650 mg total) by mouth every 6 (six) hours as needed for mild pain, moderate pain, fever or headache (or Fever >/= 101).   03/31/2015 at Unknown time  . escitalopram (LEXAPRO) 10 MG tablet Take 5 mg by mouth every morning.    04/03/2015 at Unknown time  . feeding supplement, ENSURE ENLIVE, (ENSURE ENLIVE) LIQD Take 237 mLs by mouth 2 (two) times daily between meals. 237 mL 12 04/04/2015 at Unknown time  . fish oil-omega-3 fatty acids 1000 MG capsule Take 2 g by mouth every morning.    04/09/2015 at Unknown time  . folic acid (FOLVITE) 1 MG tablet Take 2 tablets (2 mg total) by mouth daily.   04/18/2015 at Unknown time  . Multiple Vitamin (MULTIVITAMIN) capsule Take 1 capsule by mouth every morning.    03/21/2015 at Unknown time  . ondansetron (ZOFRAN-ODT) 4 MG disintegrating tablet Take 1 tablet (4 mg total) by mouth every 8 (eight) hours as needed for nausea or vomiting.   unknown  . pantoprazole (PROTONIX) 40 MG tablet Take 1 tablet (40 mg total) by mouth at bedtime. 30 tablet 0 04/13/2015 at Unknown time  . polyethylene glycol (MIRALAX / GLYCOLAX) packet Take 17 g by mouth daily. 14 each 0 04/03/2015 at Unknown time  . raloxifene (EVISTA) 60 MG tablet Take 60  mg by mouth every morning.    04/18/2015 at Unknown time  . rosuvastatin (CRESTOR) 20 MG tablet Take 10 mg by mouth every morning.    04/02/2015 at Unknown time  . simethicone (MYLICON) 80 MG chewable tablet Chew 0.5 tablets (40 mg total) by mouth every 6 (six) hours as needed for flatulence (bloating). 30 tablet 0 Past Week at Unknown time  . Vitamin D, Ergocalciferol, (DRISDOL) 50000 UNITS CAPS Take 50,000 Units by mouth every Monday.    Past Week at Unknown time   Assessment: 68 yoF who completed 13 days of vancomycin yesterday for HCAP/Sepsis. Initially presented with cough, SOB,  febrile. CXR with LLL consolidation - Thoracentesis to right chest done to assess cell counts and infection analysis.   Today she is febrile to 103.9, hypotensive, LA 2.4, Pct 0.88, WBC 5.7.  Dr. [name] wishes to restart vancomycin and Zosyn for possible sepsis.  vancomycin 9/25>>10/7, restarted 10/8 > Zosyn 10/8 > ceftazidime 9/25>> 10/2  imipenem 10/2>>10/7   See cultures above  9/28 1330 VT: 6 (Of note: one dose was missed 9/27) - Increased from 500 q12 to 500 q8  9/30 VT = 23, drawn 1 hr late. Change to 1g q24h  10/4 VT = 15 on 1g Q 24 -> no change  Goal of Therapy:  Vancomycin trough level 10-15 mcg/ml  Plan:  Vancomycin 750 mg IV q12h Zosyn 3.375 g q8h Expected duration 5 days with resolution of temperature and/or normalization of WBC Measure antibiotic drug levels at steady state Follow up culture results, LOT, clinical improvement  Governor Specking, PharmD Clinical Pharmacy Resident Pager: [phone] 04/27/2015,9:49 AM

## 2015-04-27 NOTE — Progress Notes (Signed)
eLink Physician-Brief Progress Note Patient Name: [name] [name] DOB: 07-31-1934 MRN: [id number]   Date of Service  04/27/2015  HPI/Events of Note  Multiple issues: 1. ABG on 50%/PRVC 22/TV 390/P 10 = 7.22/46/47/19.7 (Venous ABG ??) and 2. Oliguria. CVP = 9.  eICU Interventions  Will order: 1. Increase FiO2 to 100% and PEEP to 12. 2. Bolus with 0.9 NaCl 500 mL IV over 30 minutes now.      Intervention Category Major Interventions: Respiratory failure - evaluation and management  Sommer,Steven [place] 04/27/2015, 9:32 PM

## 2015-04-27 NOTE — Procedures (Signed)
INTUBATION PROCEDURE NOTE  Indication: Acute respiratory failure Consent: emergent Time Out: yes Medications: Versed, fentanyl, rocuronium, etomidate Paralytic/RSI: yes Technique: Direct laryngoscopy Blade: Miller 2 Cords Visualized: yes View: 1 # of attempts: 1 Tube confirmation:   Chest rise: yes  Bilateral Breath Sounds: yes  Color change on CO2 detector: yes  ETCO2: yes  CXR: Ordered. Successful placement: yes    Meribeth Mattes, DO., [place]

## 2015-04-27 NOTE — Progress Notes (Signed)
ABG obtained was venous. MD notified. Ordered to increase rate to 22. No need for another Arterial stick at this time. RN aware.

## 2015-04-28 ENCOUNTER — Inpatient Hospital Stay (HOSPITAL_COMMUNITY): Payer: Medicare Other

## 2015-04-28 LAB — BASIC METABOLIC PANEL
Anion gap: 13 (ref 5–15)
BUN: 36 mg/dL — ABNORMAL HIGH (ref 6–20)
CO2: 20 mmol/L — ABNORMAL LOW (ref 22–32)
Calcium: 7.3 mg/dL — ABNORMAL LOW (ref 8.9–10.3)
Chloride: 106 mmol/L (ref 101–111)
Creatinine, Ser: 1.53 mg/dL — ABNORMAL HIGH (ref 0.44–1.00)
GFR calc Af Amer: 36 mL/min — ABNORMAL LOW (ref >60)
GFR calc non Af Amer: 31 mL/min — ABNORMAL LOW (ref >60)
Glucose, Bld: 202 mg/dL — ABNORMAL HIGH (ref 65–99)
Potassium: 4.4 mmol/L (ref 3.5–5.1)
Sodium: 139 mmol/L (ref 135–145)

## 2015-04-28 LAB — POCT I-STAT 3, ART BLOOD GAS (G3+)
Acid-base deficit: 5 mmol/L — ABNORMAL HIGH (ref 0.0–2.0)
Bicarbonate: 20.6 mEq/L (ref 20.0–24.0)
O2 SAT: 100 %
PCO2 ART: 38.8 mmHg (ref 35.0–45.0)
PH ART: 7.332 — AB (ref 7.350–7.450)
PO2 ART: 229 mmHg — AB (ref 80.0–100.0)
Patient temperature: 98.3
TCO2: 22 mmol/L (ref 0–100)

## 2015-04-28 LAB — GLUCOSE, CAPILLARY
GLUCOSE-CAPILLARY: 209 mg/dL — AB (ref 65–99)
GLUCOSE-CAPILLARY: 157 mg/dL — AB (ref 65–99)
GLUCOSE-CAPILLARY: 136 mg/dL — AB (ref 65–99)
Glucose-Capillary: 138 mg/dL — ABNORMAL HIGH (ref 65–99)
Glucose-Capillary: 182 mg/dL — ABNORMAL HIGH (ref 65–99)
Glucose-Capillary: 215 mg/dL — ABNORMAL HIGH (ref 65–99)
Glucose-Capillary: 216 mg/dL — ABNORMAL HIGH (ref 65–99)

## 2015-04-28 LAB — PHOSPHORUS: PHOSPHORUS: 5.9 mg/dL — AB (ref 2.5–4.6)

## 2015-04-28 LAB — CBC
HEMATOCRIT: 33.8 % — AB (ref 36.0–46.0)
Hemoglobin: 11 g/dL — ABNORMAL LOW (ref 12.0–15.0)
MCH: 30.2 pg (ref 26.0–34.0)
MCHC: 32.5 g/dL (ref 30.0–36.0)
MCV: 92.9 fL (ref 78.0–100.0)
Platelets: 94 10*3/uL — ABNORMAL LOW (ref 150–400)
RBC: 3.64 MIL/uL — ABNORMAL LOW (ref 3.87–5.11)
RDW: 16.9 % — AB (ref 11.5–15.5)
WBC: 13.2 10*3/uL — ABNORMAL HIGH (ref 4.0–10.5)

## 2015-04-28 LAB — MAGNESIUM: Magnesium: 2.4 mg/dL (ref 1.7–2.4)

## 2015-04-28 NOTE — Progress Notes (Signed)
Spoke with Dr. Nelda Marseille at beside, orders for restraints can be renewed, a line to be placed, FIO2 and peep to be weaned as able-RT notified.  Rowe Pavy, RN

## 2015-04-28 NOTE — Progress Notes (Signed)
12 Days Post-Op Procedure(s) (LRB): SUBXYPHOID PERICARDIAL WINDOW (N/A) CHEST TUBE INSERTION (Left) TRANSESOPHAGEAL ECHOCARDIOGRAM (TEE) (N/A) Subjective:  Intubated but more responsive today.  Objective: Vital signs in last 24 hours: Temp:  [97.2 F (36.2 C)-102 F (38.9 C)] 97.2 F (36.2 C) (10/09 0840) Pulse Rate:  [31-126] 73 (10/09 0915) Cardiac Rhythm:  [-] Normal sinus rhythm (10/09 0747) Resp:  [19-33] 22 (10/09 0915) BP: (54-147)/(19-107) 118/56 mmHg (10/09 0915) SpO2:  [98 %-100 %] 100 % (10/09 0915) FiO2 (%):  [50 %-100 %] 80 % (10/09 0849) Weight:  [79.9 kg (176 lb 2.4 oz)] 79.9 kg (176 lb 2.4 oz) (10/09 0430)  Hemodynamic parameters for last 24 hours: CVP:  [4 mmHg-13 mmHg] 5 mmHg  Intake/Output from previous day: 10/08 0701 - 10/09 0700 In: 2901.3 [I.V.:1121.3; NG/GT:660; IV Piggyback:1000] Out: 645 [Urine:285; Stool:180; Chest Tube:180] Intake/Output this shift: Total I/O In: 94.1 [I.V.:29.1; NG/GT:65] Out: 15 [Urine:15]  Lungs: clear to auscultation bilaterally chest tube output serous  Lab Results:  Recent Labs  04/27/15 0330 04/28/15 0435  WBC 5.7 13.2*  HGB 10.1* 11.0*  HCT 31.1* 33.8*  PLT 73* 94*   BMET:  Recent Labs  04/27/15 0330 04/28/15 0435  NA 142 139  K 3.7 4.4  CL 109 106  CO2 25 20*  GLUCOSE 84 202*  BUN 21* 36*  CREATININE 0.88 1.53*  CALCIUM 7.6* 7.3*    PT/INR:  Recent Labs  04/27/15 0329  LABPROT 20.6*  INR 1.77*   ABG    Component Value Date/Time   PHART 7.222* 04/27/2015 2116   HCO3 18.7* 04/27/2015 2116   TCO2 20 04/27/2015 2116   ACIDBASEDEF 9.0* 04/27/2015 2116   O2SAT 74.0 04/27/2015 2116   CBG (last 3)   Recent Labs  04/28/15 0021 04/28/15 0427 04/28/15 0807  GLUCAP 215* 182* 209*   CLINICAL DATA: Shortness of breath. Endotracheal intubation.  EXAM: PORTABLE CHEST 1 VIEW  COMPARISON: Chest radiograph of April 27, 2015. CT scan of chest of April 15, 2015.  FINDINGS: The  heart size and mediastinal contours are within normal limits. Stable support apparatus. Left-sided chest tube is noted and unchanged with minimal left apical pneumothorax. Right pleural effusion noted on prior exam has resolved. Nodular density is noted laterally in the right lung base which may represent focal inflammation. The visualized skeletal structures are unremarkable.  IMPRESSION: Stable support apparatus. Minimal left apical pneumothorax is noted with no change in position of left-sided chest tube. Right pleural effusion noted on prior exam has resolved. Nodular density seen in right lung base which may correspond abnormality described on prior CT scan, which potentially may represent malignancy or neoplasm.   Electronically Signed  By: Marijo Conception, M.D.  On: 04/28/2015 07:30  Assessment/Plan: S/P Procedure(s) (LRB): SUBXYPHOID PERICARDIAL WINDOW (N/A) CHEST TUBE INSERTION (Left) TRANSESOPHAGEAL ECHOCARDIOGRAM (TEE) (N/A)  She has much more stable since being started on Solucortef for adrenal insufficiency.   CXR looks much better. The right chest opacity has resolved so it was due to atelectasis. There is no significant right pleural effusion. The left chest tube put out 180 cc yesterday. I would keep it in while on the vent to prevent recurrent effusion.   LOS: 14 days    Gaye Pollack 04/28/2015

## 2015-04-28 NOTE — Progress Notes (Signed)
Dr. [name] to place femoral a line, femoral kit at bedside along with consent from daughter.  Rowe Pavy, RN

## 2015-04-28 NOTE — Progress Notes (Signed)
PULMONARY / CRITICAL CARE MEDICINE   Name: [name] [name] MRN: [id number] DOB: 1935/02/13    ADMISSION DATE:  04/15/2015  REFERRING MD :  [facility]  CHIEF COMPLAINT:  Cough  INITIAL PRESENTATION:  80 yo admitted with cough and fever from sepsis and HCAP.  She was found to have cardiac tamponade.  She has hx of breast cancer with possible metastatic disease, MGUS.  STUDIES:  9/26  Thoracentesis  9/26  CT chest >> b/l pleural effusions, large pericardial effusion, 2.3 cm RLL nodule (was 1.8 cm) 9/26  ECHO >> tamponade 10/6  MRI Head >> negative for acute pathology   SIGNIFICANT EVENTS: 09/25  Admit, oncology consult 09/26  Cardiology consulted 09/27  TCTS consulted >> subxyphoid pericardial window 09/28  Extubated 10/02  Fever, altered mental status >>To ICU. Intubated 10/03  NSVT and torsades.  10/06  Extubated  10/07  Reintubated for hypoxemia and AMS  SUBJECTIVE: Reintubated overnight, mottled overnight with high pressor demand.  VITAL SIGNS: Temp:  [97.5 F (36.4 C)-102 F (38.9 C)] 97.6 F (36.4 C) (10/09 0400) Pulse Rate:  [31-132] 75 (10/09 0815) Resp:  [19-33] 22 (10/09 0815) BP: (54-147)/(19-107) 129/58 mmHg (10/09 0815) SpO2:  [98 %-100 %] 98 % (10/09 0815) FiO2 (%):  [50 %-100 %] 100 % (10/09 0720) Weight:  [79.9 kg (176 lb 2.4 oz)] 79.9 kg (176 lb 2.4 oz) (10/09 0430)   HEMODYNAMICS: CVP:  [4 mmHg-14 mmHg] 5 mmHg   VENTILATOR SETTINGS: Vent Mode:  [-] PRVC FiO2 (%):  [50 %-100 %] 100 % Set Rate:  [22 bmp] 22 bmp Vt Set:  [390 mL] 390 mL PEEP:  [10 cmH20-12 cmH20] 12 cmH20 Plateau Pressure:  [22 cmH20-27 cmH20] 25 cmH20   INTAKE / OUTPUT:  Intake/Output Summary (Last 24 hours) at 04/28/15 3500 Last data filed at 04/28/15 0800  Gross per 24 hour  Intake 2925.35 ml  Output    660 ml  Net 2265.35 ml   PHYSICAL EXAMINATION: General: chronically ill appearing female unresponsive Neuro:  Opens eyes but not following commands, does not withdraw to  pain. Cardiovascular:  S1, S2, No MRG. Lungs:  Even/non-labored, coarse BS diffusely. Abdomen:  Soft, non tender, BSx4 active  Musculoskeletal:  No acute deformities Skin:  Diffusely mottled  LABS:  CBC  Recent Labs Lab 04/26/15 0520 04/27/15 0329 04/27/15 0330 04/28/15 0435  WBC 9.7  --  5.7 13.2*  HGB 8.7*  --  10.1* 11.0*  HCT 25.7*  --  31.1* 33.8*  PLT 95* 73* 73* 94*   Coag's  Recent Labs Lab 04/27/15 0329  APTT 30  INR 1.77*   BMET  Recent Labs Lab 04/26/15 0520 04/27/15 0330 04/28/15 0435  NA 139 142 139  K 3.6 3.7 4.4  CL 105 109 106  CO2 26 25 20*  BUN 21* 21* 36*  CREATININE 0.65 0.88 1.53*  GLUCOSE 85 84 202*   Electrolytes  Recent Labs Lab 04/25/15 0534 04/26/15 0520 04/27/15 0330 04/28/15 0435  CALCIUM 7.3* 7.3* 7.6* 7.3*  MG 1.7 1.6*  --  2.4  PHOS 2.8 2.8  --  5.9*    Sepsis Markers  Recent Labs Lab 04/22/15 0921  04/25/15 1055 04/26/15 0520 04/27/15 0330 04/27/15 0715  LATICACIDVEN 2.0  --   --   --  2.9* 2.4*  PROCALCITON  --   < > 1.82 1.00 0.88  --   < > = values in this interval not displayed.   ABG  Recent Labs Lab 04/27/15 0300  04/27/15 0913 04/27/15 2116  PHART 7.574* 7.411 7.222*  PCO2ART 26.1* 37.8 45.6*  PO2ART 196.0* 39.0* 47.0*   Liver Enzymes  Recent Labs Lab 04/25/15 0534  ALBUMIN 1.4*   Cardiac Enzymes No results for input(s): TROPONINI, PROBNP in the last 168 hours.   Glucose  Recent Labs Lab 04/27/15 1227 04/27/15 1633 04/27/15 1941 04/28/15 0021 04/28/15 0427 04/28/15 0807  GLUCAP 153* 268* 197* 215* 182* 209*   Imaging reviewed CXR (10/7) - persistent large R pleural effusion with atelectasis, L sided chest tube  ASSESSMENT / PLAN:  PULMONARY A: Acute hypoxic respiratory failure. Rt lung nodule. Pleural effusions - thora 9/26 with negative cultures ETT 10/2>>>10/6>>>10/7>>> P:   Continue full vent support Will need a biopsy of the RLL opacity at some point when  stable for work up of malignancy. Oxygen as needed to support saturations > 92%  CARDIOVASCULAR Lt IJ CVL 9/27>>>  A:  Pericardial effusion with tamponade s/p window 9/52 Hx of diastolic CHF, CAD, HLD. NSVT and torsades - none since 10/3 P:  Monitor hemodynamics. Continue solucortef, plasma cortisol of 0.6, definitely adrenally insufficient. Chest tube management per CVTS CVP 5 but pressor demand is decreasing so will not add more fluid, patient is very fluid overloaded.  RENAL A:   AKI. Hypokalemia. Hypomagnesemia  P:   Monitor renal fx. Replace electrolytes as needed. BMET in AM.  GASTROINTESTINAL A:   Nutrition. P:   Dysphagia 3 Diet, thin liquids. SLP following. PPI - home medication.  HEMATOLOGIC A:   Hx of splenectomy, MGUS, breast cancer. Thrombocytopenia - may be secondary to stress, sepsis P:  Trend CBC  Monitor for bleeding  SCD's for DVT prophylaxis   INFECTIOUS A:   HCAP P:   It is confusing to me what abx she has been on.  Zosyn 10/8>>> Vanc start date unclear to me>>>  Blood 10/02 >> NTD Urine 10/02 >> NTD Sputum 10/02 >> normal flora  Blood 10/8>>> Sputum 10/8>>>  ENDOCRINE A:   Adrenal insufficiency. Necrotic adrenals seen on recent biopsy P:   Transition from stress dose steroids to Prednisone 10mg  QD 10/7  AUTOIMMUNE A: R/O Autoimmune source effusion / skin changes, negative malignant work up thus far P: Assess autoimmune panel 10/7  ANCA >> DS DNA >>  SCL70 >> RF >> 30 SSA/SSB >>  IgG low IgA normal  Consider biopsy of left hand lesions if more stable.  NEUROLOGIC A:   Acute encephalopathy P:   Monitor mental status Hold lexapro  FAMILY:  Updated daughter over the phone 10/8, no family bedside 10/9.  GOC:  Full code  The patient is critically ill with multiple organ systems failure and requires high complexity decision making for assessment and support, frequent evaluation and titration of therapies,  application of advanced monitoring technologies and extensive interpretation of multiple databases.   Critical Care Time devoted to patient care services described in this note is  35  Minutes. This time reflects time of care of this signee Dr Jennet Maduro. This critical care time does not reflect procedure time, or teaching time or supervisory time of PA/NP/Med student/Med Resident etc but could involve care discussion time.  Rush Farmer, M.D. [facility] Pulmonary/Critical Care Medicine. Pager: [phone]. After hours pager: [phone].

## 2015-04-28 NOTE — Procedures (Signed)
Arterial Catheter Insertion Procedure Note [name] [name] [id number] Jun 25, 1935  Procedure: Insertion of Arterial Catheter  Indications: Blood pressure monitoring and Frequent blood sampling  Procedure Details Consent: Risks of procedure as well as the alternatives and risks of each were explained to the (patient/caregiver).  Consent for procedure obtained. Time Out: Verified patient identification, verified procedure, site/side was marked, verified correct patient position, special equipment/implants available, medications/allergies/relevent history reviewed, required imaging and test results available.  Performed  Maximum sterile technique was used including antiseptics, cap, gloves, gown, hand hygiene, mask and sheet. Skin prep: Chlorhexidine; local anesthetic administered 20 gauge catheter was inserted into right femoral artery using the Seldinger technique.  Evaluation Blood flow good; BP tracing good. Complications: No apparent complications.   [name] [name]. 04/28/2015  Failed radials by [name] [name]. [name] Mould, MD, [place] Pgr: [place] [facility]

## 2015-04-29 ENCOUNTER — Inpatient Hospital Stay (HOSPITAL_COMMUNITY): Payer: Medicare Other

## 2015-04-29 LAB — GLUCOSE, CAPILLARY
GLUCOSE-CAPILLARY: 113 mg/dL — AB (ref 65–99)
GLUCOSE-CAPILLARY: 59 mg/dL — AB (ref 65–99)
GLUCOSE-CAPILLARY: 122 mg/dL — AB (ref 65–99)
Glucose-Capillary: 113 mg/dL — ABNORMAL HIGH (ref 65–99)
Glucose-Capillary: 128 mg/dL — ABNORMAL HIGH (ref 65–99)
Glucose-Capillary: 124 mg/dL — ABNORMAL HIGH (ref 65–99)

## 2015-04-29 LAB — VANCOMYCIN, RANDOM: Vancomycin Rm: 33 ug/mL

## 2015-04-29 LAB — PHOSPHORUS: PHOSPHORUS: 4.9 mg/dL — AB (ref 2.5–4.6)

## 2015-04-29 LAB — BLOOD GAS, ARTERIAL
Acid-base deficit: 4.6 mmol/L — ABNORMAL HIGH (ref 0.0–2.0)
Bicarbonate: 20.3 mEq/L (ref 20.0–24.0)
DRAWN BY: 252031
FIO2: 0.5
MECHVT: 390 mL
O2 SAT: 99.2 %
PATIENT TEMPERATURE: 98.6
PCO2 ART: 39.6 mmHg (ref 35.0–45.0)
PEEP: 5 cmH2O
PH ART: 7.33 — AB (ref 7.350–7.450)
RATE: 22 resp/min
TCO2: 21.5 mmol/L (ref 0–100)
pO2, Arterial: 194 mmHg — ABNORMAL HIGH (ref 80.0–100.0)

## 2015-04-29 LAB — BASIC METABOLIC PANEL
Anion gap: 10 (ref 5–15)
BUN: 50 mg/dL — AB (ref 6–20)
CHLORIDE: 108 mmol/L (ref 101–111)
CO2: 20 mmol/L — AB (ref 22–32)
CREATININE: 1.79 mg/dL — AB (ref 0.44–1.00)
Calcium: 7.5 mg/dL — ABNORMAL LOW (ref 8.9–10.3)
GFR calc non Af Amer: 26 mL/min — ABNORMAL LOW (ref >60)
GFR, EST AFRICAN AMERICAN: 30 mL/min — AB (ref >60)
Glucose, Bld: 130 mg/dL — ABNORMAL HIGH (ref 65–99)
POTASSIUM: 3.7 mmol/L (ref 3.5–5.1)
Sodium: 138 mmol/L (ref 135–145)

## 2015-04-29 LAB — CBC
HEMATOCRIT: 26.3 % — AB (ref 36.0–46.0)
HEMOGLOBIN: 8.5 g/dL — AB (ref 12.0–15.0)
MCH: 29.6 pg (ref 26.0–34.0)
MCHC: 32.3 g/dL (ref 30.0–36.0)
MCV: 91.6 fL (ref 78.0–100.0)
Platelets: 114 10*3/uL — ABNORMAL LOW (ref 150–400)
RBC: 2.87 MIL/uL — AB (ref 3.87–5.11)
RDW: 16.8 % — ABNORMAL HIGH (ref 11.5–15.5)
WBC: 14.1 10*3/uL — ABNORMAL HIGH (ref 4.0–10.5)

## 2015-04-29 LAB — ANCA TITERS
Atypical P-ANCA titer: <1:20 {titer}
C-ANCA: <1:20 {titer}
P-ANCA: <1:20 {titer}

## 2015-04-29 LAB — VANCOMYCIN, TROUGH: Vancomycin Tr: 36 ug/mL (ref 10.0–20.0)

## 2015-04-29 LAB — JO-1 ANTIBODY-IGG: Jo-1 Antibody, IgG: <1

## 2015-04-29 LAB — MAGNESIUM: Magnesium: 2.2 mg/dL (ref 1.7–2.4)

## 2015-04-29 LAB — ANTINUCLEAR ANTIBODIES, IFA: ANA Ab, IFA: NEGATIVE

## 2015-04-29 MED ORDER — FOLIC ACID 1 MG PO TABS
2.0000 mg | ORAL_TABLET | Freq: Every day | ORAL | Status: DC
  Administered 2015-04-29 – 2015-05-05 (×7): 2 mg
  Filled 2015-04-29 (×7): qty 2

## 2015-04-29 MED ORDER — PANTOPRAZOLE SODIUM 40 MG PO PACK
40.0000 mg | PACK | Freq: Every day | ORAL | Status: DC
  Administered 2015-04-30 – 2015-05-05 (×6): 40 mg
  Filled 2015-04-29 (×6): qty 20

## 2015-04-29 MED ORDER — FENTANYL CITRATE (PF) 100 MCG/2ML IJ SOLN
50.0000 ug | INTRAMUSCULAR | Status: DC | PRN
  Administered 2015-05-03: 50 ug via INTRAVENOUS
  Administered 2015-05-04: 100 ug via INTRAVENOUS
  Administered 2015-05-04: 200 ug via INTRAVENOUS
  Administered 2015-05-04: 50 ug via INTRAVENOUS
  Filled 2015-04-29: qty 2
  Filled 2015-04-29: qty 4
  Filled 2015-04-29 (×2): qty 2

## 2015-04-29 NOTE — Progress Notes (Signed)
Fentanyl gtt 134ml wasted down sink with two nurse verification. Hyiu Ksor RN and Roselle Locus.

## 2015-04-29 NOTE — Progress Notes (Addendum)
TCTS DAILY ICU PROGRESS NOTE                   [address]            [place] [address]          [phone]   13 Days Post-Op Procedure(s) (LRB): SUBXYPHOID PERICARDIAL WINDOW (N/A) CHEST TUBE INSERTION (Left) TRANSESOPHAGEAL ECHOCARDIOGRAM (TEE) (N/A)  Total Length of Stay:  LOS: 15 days   Subjective:  Intubated over the weekend.  She opens eyes, responds weekly to commands  Objective: Vital signs in last 24 hours: Temp:  [97.1 F (36.2 C)-98 F (36.7 C)] 97.3 F (36.3 C) (10/10 0700) Pulse Rate:  [37-92] 79 (10/10 0700) Cardiac Rhythm:  [-] Normal sinus rhythm (10/10 0600) Resp:  [19-23] 22 (10/10 0700) BP: (78-145)/(36-91) 88/40 mmHg (10/10 0700) SpO2:  [98 %-100 %] 100 % (10/10 0700) Arterial Line BP: (88-191)/(34-82) 119/39 mmHg (10/10 0700) FiO2 (%):  [50 %-80 %] 50 % (10/10 0700) Weight:  [175 lb 4.3 oz (79.5 kg)] 175 lb 4.3 oz (79.5 kg) (10/10 0045)  Filed Weights   04/27/15 0600 04/28/15 0430 04/29/15 0045  Weight: 175 lb 0.7 oz (79.4 kg) 176 lb 2.4 oz (79.9 kg) 175 lb 4.3 oz (79.5 kg)    Weight change: -14.1 oz (-0.4 kg)   Hemodynamic parameters for last 24 hours: CVP:  [4 mmHg-8 mmHg] 8 mmHg  Intake/Output from previous day: 10/09 0701 - 10/10 0700 In: 2586.2 [I.V.:556.2; NG/GT:1650; IV Piggyback:300] Out: 847 [Urine:347; Stool:400; Chest Tube:100]  Current Meds: Scheduled Meds: . antiseptic oral rinse  7 mL Mouth Rinse QID  . chlorhexidine gluconate  15 mL Mouth Rinse BID  . fentaNYL (SUBLIMAZE) injection  50 mcg Intravenous Once  . folic acid  2 mg Intravenous Daily  . Brit Wernette's butt cream   Topical Daily  . guaiFENesin  600 mg Oral BID  . hydrocortisone sodium succinate  50 mg Intravenous Q6H  . insulin aspart  0-9 Units Subcutaneous 6 times per day  . pantoprazole (PROTONIX) IV  40 mg Intravenous Q24H  . piperacillin-tazobactam (ZOSYN)  IV  3.375 g Intravenous Q8H  . raloxifene  60 mg Oral q morning - 10a  . sodium  chloride  1,000 mL Intravenous Once  . sodium chloride  10-40 mL Intracatheter Q12H   Continuous Infusions: . sodium chloride 10 mL/hr at 04/29/15 0700  . DOPamine Stopped (04/27/15 0947)  . feeding supplement (VITAL AF 1.2 CAL) 1,000 mL (04/29/15 0700)  . fentaNYL infusion INTRAVENOUS 100 mcg/hr (04/29/15 0700)  . norepinephrine (LEVOPHED) Adult infusion Stopped (04/29/15 0400)   PRN Meds:.acetaminophen (TYLENOL) oral liquid 160 mg/5 mL, acetaminophen, fentaNYL, levalbuterol, midazolam, midazolam, sodium chloride  General appearance: intubated, responds to commands Heart: regular rate and rhythm Lungs: clear to auscultation bilaterally Wound: clean andd ry  Lab Results: CBC: Recent Labs  04/28/15 0435 04/29/15 0458  WBC 13.2* PENDING  HGB 11.0* 8.5*  HCT 33.8* 26.3*  PLT 94* PENDING   BMET:  Recent Labs  04/28/15 0435 04/29/15 0458  NA 139 138  K 4.4 3.7  CL 106 108  CO2 20* 20*  GLUCOSE 202* 130*  BUN 36* 50*  CREATININE 1.53* 1.79*  CALCIUM 7.3* 7.5*    PT/INR:  Recent Labs  04/27/15 0329  LABPROT 20.6*  INR 1.77*   Radiology: Dg Chest Port 1 View  04/29/2015   CLINICAL DATA:  Respiratory failure, ventilator dependent, CHF, monoclonal gammopathy  EXAM: PORTABLE CHEST 1 VIEW  COMPARISON:  Portable chest x-ray of April 28, 2015 and April 27, 2015.  FINDINGS: The lungs are well-expanded. The tiny left apical pneumothorax is not evident today. The left-sided chest tube is unchanged in position. There is no significant left pleural effusion. There is small right pleural effusion. Alveolar opacity persists at the right lung base. The heart is normal in size. The pulmonary vascularity is not engorged. The endotracheal tube tip lies 2.3 cm above the carina. The esophagogastric tube tip projects below the inferior margin of the image. The left internal jugular venous catheter tip projects over the midportion of the SVC.  IMPRESSION: No left pneumothorax visible today.  Persistent small right pleural effusion with subtle parenchymal opacity at the right lung base, stable. The support tubes are in reasonable position.   Electronically Signed   By: David  Martinique M.D.   On: 04/29/2015 07:15     Assessment/Plan: S/P Procedure(s) (LRB): SUBXYPHOID PERICARDIAL WINDOW (N/A) CHEST TUBE INSERTION (Left) TRANSESOPHAGEAL ECHOCARDIOGRAM (TEE) (N/A)  1. Chest tube- 100 cc output yesterday ( level at 1600)- will remain in place while intubated 2. Pulm- on vent, weaning as tolerated, CXR with improvement of right sided atelectasis, small effusion 3. Renal- creatinine slowly rising 4. ID- on ABX for sepsis, pneumonia 5. Dispo- care per CCM, leave chest tube until extubated     [name], [name] 04/29/2015 8:02 AM  Chest tube  In place Please call if need assistance Will not follow daily I have seen and examined [name] [name] and agree with the above assessment  and plan.  Grace Isaac MD Beeper [phone] Office [phone] 04/29/2015 7:37 PM

## 2015-04-29 NOTE — Consult Note (Addendum)
WOC wound follow up  Following along with team to monitor status of wounds POA Wound type:  Stage 3 pressure injury, gluteal cleft with associated deep tissue injury around the open pressure injury. Right heel is still discolored but discoloration seems lighter and less diffuse over the heel Left heel discolored with one small central very dark area but not eschar.  Again the entire LE are discolored with a dark purple discoloration, unclear etiology.  The LE are now forming serous filled bulla and her skin is weeping at these sites. Multiple areas where the bulla have ruptured and she has partial thickness skin loss over her right thigh, left lateral ankle, bilateral UE.  Anal opening has some skin irritation related to the use of a flexiseal, however she does need the device to protect her skin.   Measurement: Opening is 2.5cm x 0.5cm x 0.2cm with circumferential suspected deep tissue injury that including the open PI is 5.5cm x 3.5cm x 0  Wound bed: centrally the open pressure injury has a yellow base Drainage (amount, consistency, odor) minimal, serosanguinous  Periwound: intact with some adjacent skin color changes consistent with the skin color changes over the patients legs, arms, trunk, ears, face.  Dressing procedure/placement/frequency: Continue silicone foam for protection. LALM in place for pressure redistribution.  Prevalon boots in place to offload the heels. Silicone foam to protect areas of skin loss on the extremities. Continue to use barrier cream around flexiseal to protect the skin.   [place] team will follow along with you for weekly wound assessments.  Please notify me of any acute changes in the wounds or any new areas of concerns Para March RN,CWOCN [phone]

## 2015-04-29 NOTE — Care Management Note (Signed)
Case Management Note  Patient Details  Name: [name] [name] MRN: [id number] Date of Birth: February 23, 1935  Subjective/Objective:       Re intubated this weekend with AMS - and placed on pressors.  ?? Ltach candidate          Action/Plan:   Expected Discharge Date:                  Expected Discharge Plan:  Long Term Acute Care (LTAC)  In-House Referral:  Clinical Social Work  Discharge planning Services  CM Consult  Post Acute Care Choice:    Choice offered to:     DME Arranged:    DME Agency:     HH Arranged:    [place] Agency:     Status of Service:  In process, will continue to follow  Medicare Important Message Given:  Yes-fourth notification given Date Medicare IM Given:    Medicare IM give by:    Date Additional Medicare IM Given:    Additional Medicare Important Message give by:     If discussed at [place] of Stay Meetings, dates discussed:    Additional Comments:  Vergie Living, RN 04/29/2015, 10:41 AM

## 2015-04-29 NOTE — Care Management Important Message (Signed)
Important Message  Patient Details  Name: [name] [name] MRN: [id number] Date of Birth: June 03, 1935   Medicare Important Message Given:  Yes-second notification given    Nathen May 04/29/2015, 11:24 AM

## 2015-04-29 NOTE — Progress Notes (Signed)
Vancomycin trough level 36, notified pharmacy.

## 2015-04-29 NOTE — Progress Notes (Signed)
Spoke with daughter in regards to [place].  Patient daughter requests to have meeting with Pallative Care team this wed 10/12 at 545pm.  MD Ramaswamy notified at this time of discussion with family.

## 2015-04-29 NOTE — Progress Notes (Addendum)
ANTIBIOTIC CONSULT NOTE - Follow-up  Pharmacy Consult for vancomycin  Indication: rule out sepsis/HAP  No Known Allergies  Patient Measurements: Height: 5' 8.5" (174 cm) Weight: 175 lb 4.3 oz (79.5 kg) (with air matress) IBW/kg (Calculated) : 65.05   Vital Signs: Temp: 97.4 F (36.3 C) (10/09 2344) Temp Source: Axillary (10/09 2344) BP: 86/44 mmHg (10/10 0100) Pulse Rate: 70 (10/10 0100) Intake/Output from previous day: 10/09 0701 - 10/10 0700 In: 1997.4 [I.V.:437.4; NG/GT:1230; IV Piggyback:250] Out: 567 [Urine:267; Stool:200; Chest Tube:100] Intake/Output from this shift: Total I/O In: 568.4 [I.V.:118.4; NG/GT:450] Out: 145 [Urine:105; Chest Tube:40]  Labs:  Recent Labs  04/26/15 0520 04/27/15 0329 04/27/15 0330 04/28/15 0435  WBC 9.7  --  5.7 13.2*  HGB 8.7*  --  10.1* 11.0*  PLT 95* 73* 73* 94*  CREATININE 0.65  --  0.88 1.53*   Estimated Creatinine Clearance: 33.9 mL/min (by C-G formula based on Cr of 1.53).  Recent Labs  04/28/15 2154  [facility] 36*     Microbiology: Recent Results (from the past 240 hour(s))  Culture, respiratory (NON-Expectorated)     Status: None   Collection Time: 04/21/15 12:00 PM  Result Value Ref Range Status   Specimen Description ENDOTRACHEAL  Final   Special Requests NONE  Final   Gram Stain   Final    RARE WBC PRESENT, PREDOMINANTLY PMN RARE SQUAMOUS EPITHELIAL CELLS PRESENT RARE GRAM POSITIVE COCCI IN PAIRS RARE GRAM NEGATIVE RODS Performed at [facility]    Culture   Final    Non-Pathogenic Oropharyngeal-type [place]ora Isolated. Performed at [facility]    Report Status 04/24/2015 FINAL  Final  Culture, blood (routine x 2)     Status: None   Collection Time: 04/21/15 12:55 PM  Result Value Ref Range Status   Specimen Description BLOOD RIGHT HAND  Final   Special Requests IN PEDIATRIC BOTTLE .5CC  Final   Culture NO GROWTH 5 DAYS  Final   Report Status 04/26/2015 FINAL  Final  Culture, blood  (routine x 2)     Status: None   Collection Time: 04/21/15  1:15 PM  Result Value Ref Range Status   Specimen Description BLOOD RIGHT HAND  Final   Special Requests IN PEDIATRIC BOTTLE .5CC  Final   Culture NO GROWTH 5 DAYS  Final   Report Status 04/26/2015 FINAL  Final  Culture, Urine     Status: None   Collection Time: 04/21/15  2:06 PM  Result Value Ref Range Status   Specimen Description URINE, CATHETERIZED  Final   Special Requests NONE  Final   Culture NO GROWTH 1 DAY  Final   Report Status 04/22/2015 FINAL  Final  C difficile quick scan w PCR reflex     Status: None   Collection Time: 04/24/15 11:45 AM  Result Value Ref Range Status   C Diff antigen NEGATIVE NEGATIVE Final   C Diff toxin NEGATIVE NEGATIVE Final   C Diff interpretation Negative for toxigenic C. difficile  Final  Culture, respiratory (NON-Expectorated)     Status: None (Preliminary result)   Collection Time: 04/27/15  8:45 AM  Result Value Ref Range Status   Specimen Description TRACHEAL ASPIRATE  Final   Special Requests NONE  Final   Gram Stain   Final    RARE WBC PRESENT, PREDOMINANTLY PMN NO SQUAMOUS EPITHELIAL CELLS SEEN NO ORGANISMS SEEN Performed at [facility]    Culture PENDING  Incomplete   Report Status PENDING  Incomplete  Culture, blood (routine x 2)     Status: None (Preliminary result)   Collection Time: 04/27/15 10:40 PM  Result Value Ref Range Status   Specimen Description BLOOD CENTRAL LINE  Final   Special Requests BOTTLES DRAWN AEROBIC AND ANAEROBIC 2CC  Final   Culture NO GROWTH < 12 HOURS  Final   Report Status PENDING  Incomplete     Assessment: 41 yoF who completed 13 days of vancomycin yesterday for HCAP/Sepsis. Initially presented with cough, SOB, febrile. CXR with LLL consolidation - Thoracentesis to right chest done to assess cell counts and infection analysis. She continues on vancomycin and zosyn. A trough was checked today and is elevated at 36. Dose has  previously been discontinued. Scr jumped from 0.88 to 1.53 which would explain the increased trough.   vancomycin 9/25>>10/7, restarted 10/8 > Zosyn 10/8 > ceftazidime 9/25>> 10/2  imipenem 10/2>>10/7   See cultures above  9/28 1330 VT: 6 (Of note: one dose was missed 9/27) - Increased from 500 q12 to 500 q8  9/30 VT = 23, drawn 1 hr late. Change to 1g q24h  10/4 VT = 15 on 1g Q 24 -> no change 10/9 VT = 36  Goal of Therapy:  Vancomycin trough level 15-20 mcg/ml  Plan:  - Hold vancomycin - Check a random AM level to assess clearance (level will likely still be elevated) - Check a BMET in AM to assess Scr trend  Salome Arnt, PharmD, BCPS Pager # [phone] 04/29/2015 1:31 AM  Addendum: AM vancomycin level is elevated as expected. Scr also continues to climb. Continue to hold vancomycin. F/u AM vancomycin random and trend Scr  Salome Arnt, PharmD, BCPS Pager # [phone] 04/29/2015 5:45 AM

## 2015-04-29 NOTE — Progress Notes (Signed)
PULMONARY / CRITICAL CARE MEDICINE   Name: [name] [name] MRN: [id number] DOB: 1935-06-17    ADMISSION DATE:  03/31/2015  LOS 15 days   REFERRING MD :  [facility]  CHIEF COMPLAINT:  Cough  INITIAL PRESENTATION:  80 yo admitted with cough and fever from sepsis and HCAP.  She was found to have cardiac tamponade.  She has hx of breast cancer with possible metastatic disease, MGUS.  STUDIES/EVENT:  02/20/15 - bone marrow - predom CD8 cells 04/03/15 - Left adrenal bx  - extensive necrosis  04/05/15 - splenectomy bx -  ...Marland KitchenMarland KitchenMarland Kitchen 09/25  Admit, oncology consult 9/26  Thoracentesis  9/26  CT chest >> b/l pleural effusions, large pericardial effusion, 2.3 cm RLL nodule (was 1.8 cm) 9/26  ECHO >> tamponade  09/26  Cardiology consulted 09/27  TCTS consulted >> subxyphoid pericardial window. PERICARDIAL BIOPSY - NO EVIDENCE OF MALIGNANCY 09/28  Extubated 10/116 - echo - nromal EF 10/02  Fever, altered mental status >>To ICU. Intubated 10/03  NSVT and torsades.  10/6  MRI Head >> negative for acute pathology  10/06  Extubated  10/07  Reintubated for hypoxemia and AMS. AUTOIMMUNE NEGATIVE 04/28/15: : Reintubated overnight, mottled overnight with high pressor demand.  SUBJECTIVE/OVERNIGHT/INTERVAL HX 04/29/15: Off levophed. RN very concerned abotu extensive dermal necrosis all over body. Overall very weak. Follows commands. Looks very decondtioned. On fent gtt  VITAL SIGNS: Temp:  [97.1 F (36.2 C)-98 F (36.7 C)] 97.3 F (36.3 C) (10/10 0700) Pulse Rate:  [37-92] 75 (10/10 1000) Resp:  [19-23] 22 (10/10 1000) BP: (78-145)/(36-75) 118/49 mmHg (10/10 1000) SpO2:  [99 %-100 %] 100 % (10/10 1000) Arterial Line BP: (88-191)/(34-82) 142/46 mmHg (10/10 1000) FiO2 (%):  [40 %-70 %] 40 % (10/10 1000) Weight:  [79.5 kg (175 lb 4.3 oz)] 79.5 kg (175 lb 4.3 oz) (10/10 0045)   HEMODYNAMICS: CVP:  [4 mmHg-8 mmHg] 8 mmHg   VENTILATOR SETTINGS: Vent Mode:  [-] PRVC FiO2 (%):  [40 %-70 %] 40 % Set Rate:  [22  bmp] 22 bmp Vt Set:  [390 mL] 390 mL PEEP:  [5 cmH20-10 cmH20] 5 cmH20 Plateau Pressure:  [16 cmH20-24 cmH20] 18 cmH20   INTAKE / OUTPUT:  Intake/Output Summary (Last 24 hours) at 04/29/15 1035 Last data filed at 04/29/15 0800  Gross per 24 hour  Intake 2233.55 ml  Output    810 ml  Net 1423.55 ml   PHYSICAL EXAMINATION: General: chronically ill appearing female unresponsive Neuro:  Opens eyes but not following commands, does not withdraw to pain. Cardiovascular:  S1, S2, No MRG. Lungs:  Even/non-labored, coarse BS diffusely. Abdomen:  Soft, non tender, BSx4 active  Musculoskeletal:  No acute deformities Skin:  Diffusely mottled  LABS:  PULMONARY  Recent Labs Lab 04/27/15 0300  04/27/15 0913 04/27/15 2105 04/27/15 2116 04/28/15 1520 04/29/15 0405  PHART 7.574*  --  7.411  --  7.222* 7.332* 7.330*  PCO2ART 26.1*  --  37.8  --  45.6* 38.8 39.6  PO2ART 196.0*  --  39.0*  --  47.0* 229.0* 194*  HCO3 24.3*  --  23.5 18.4* 18.7* 20.6 20.3  TCO2 25  --  25 20 20 22  21.5  O2SAT 100.0  < > 67.0 50.0 74.0 100.0 99.2  < > = values in this interval not displayed.  CBC  Recent Labs Lab 04/27/15 0330 04/28/15 0435 04/29/15 0458  HGB 10.1* 11.0* 8.5*  HCT 31.1* 33.8* 26.3*  WBC 5.7 13.2* 14.1*  PLT 73* 94* 114*  COAGULATION  Recent Labs Lab 04/27/15 0329  INR 1.77*    CARDIAC  No results for input(s): TROPONINI in the last 168 hours. No results for input(s): PROBNP in the last 168 hours.   CHEMISTRY  Recent Labs Lab 04/24/15 0455 04/25/15 0534 04/26/15 0520 04/27/15 0330 04/28/15 0435 04/29/15 0458  NA 131* 133* 139 142 139 138  K 3.2* 3.3* 3.6 3.7 4.4 3.7  CL 100* 99* 105 109 106 108  CO2 22 25 26 25  20* 20*  GLUCOSE 125* 119* 85 84 202* 130*  BUN 23* 24* 21* 21* 36* 50*  CREATININE 1.01* 0.86 0.65 0.88 1.53* 1.79*  CALCIUM 7.0* 7.3* 7.3* 7.6* 7.3* 7.5*  MG 2.0 1.7 1.6*  --  2.4 2.2  PHOS 3.3 2.8 2.8  --  5.9* 4.9*   Estimated Creatinine  Clearance: 29 mL/min (by C-G formula based on Cr of 1.79).   LIVER  Recent Labs Lab 04/25/15 0534 04/27/15 0329  ALBUMIN 1.4*  --   INR  --  1.77*     INFECTIOUS  Recent Labs Lab 04/25/15 1055 04/26/15 0520 04/27/15 0330 04/27/15 0715  LATICACIDVEN  --   --  2.9* 2.4*  PROCALCITON 1.82 1.00 0.88  --      ENDOCRINE CBG (last 3)   Recent Labs  04/29/15 0421 04/29/15 0753 04/29/15 0759  GLUCAP 113* 59* 113*         IMAGING x48h  - image(s) personally visualized  -   highlighted in bold Dg Chest Port 1 View  04/29/2015   CLINICAL DATA:  Respiratory failure, ventilator dependent, CHF, monoclonal gammopathy  EXAM: PORTABLE CHEST 1 VIEW  COMPARISON:  Portable chest x-ray of April 28, 2015 and April 27, 2015.  FINDINGS: The lungs are well-expanded. The tiny left apical pneumothorax is not evident today. The left-sided chest tube is unchanged in position. There is no significant left pleural effusion. There is small right pleural effusion. Alveolar opacity persists at the right lung base. The heart is normal in size. The pulmonary vascularity is not engorged. The endotracheal tube tip lies 2.3 cm above the carina. The esophagogastric tube tip projects below the inferior margin of the image. The left internal jugular venous catheter tip projects over the midportion of the SVC.  IMPRESSION: No left pneumothorax visible today. Persistent small right pleural effusion with subtle parenchymal opacity at the right lung base, stable. The support tubes are in reasonable position.   Electronically Signed   By: David  Martinique M.D.   On: 04/29/2015 07:15   Dg Chest Port 1 View  04/28/2015   CLINICAL DATA:  Shortness of breath.  Endotracheal intubation.  EXAM: PORTABLE CHEST 1 VIEW  COMPARISON:  Chest radiograph of April 27, 2015. CT scan of chest of April 15, 2015.  FINDINGS: The heart size and mediastinal contours are within normal limits. Stable support apparatus. Left-sided chest  tube is noted and unchanged with minimal left apical pneumothorax. Right pleural effusion noted on prior exam has resolved. Nodular density is noted laterally in the right lung base which may represent focal inflammation. The visualized skeletal structures are unremarkable.  IMPRESSION: Stable support apparatus. Minimal left apical pneumothorax is noted with no change in position of left-sided chest tube. Right pleural effusion noted on prior exam has resolved. Nodular density seen in right lung base which may correspond abnormality described on prior CT scan, which potentially may represent malignancy or neoplasm.   Electronically Signed   By: Marijo Conception, M.D.  On: 04/28/2015 07:30       ASSESSMENT / PLAN:  PULMONARY A: Acute hypoxic respiratory failure. Rt lung nodule. Pleural effusions - thora 9/26 with negative cultures ETT 10/2>>>10/6>>>10/7>>>   - failure to wean; currently due to decondituioning  P:   Daily SBT - needs trach/LTAC if family decides to pursue full code Continue full vent support Will need a biopsy of the RLL opacity at some point when stable for work up of malignancy. Oxygen as needed to support saturations > 92%  CARDIOVASCULAR Lt IJ CVL 9/27>>>  A:  Pericardial effusion with tamponade s/p window 9/27 with resultant chest tube. Nondiagnostic bx Hx of diastolic CHF, CAD, HLD. NSVT and torsades - none since 10/3 On hydrocort since 04/27/15 plasma cortisol of 0.6, definitely adrenally insufficient.    - off pressors P:  Monitor hemodynamics. Continue solucortef, Chest tube management per CVTS  RENAL A:   AKI.  - getting worse   P:   Monitor renal fx. Replace electrolytes as needed. BMET in AM. Avoid nephrotix drugs  GASTROINTESTINAL A:   Nutrition. P:   Tube feeds SLP following. PPI - home medication.  HEMATOLOGIC A:   Hx of splenectomy, MGUS, breast cancer. Thrombocytopenia - may be secondary to stress, sepsis P:  Trend CBC   Monitor for bleeding  SCD's for DVT prophylaxis   INFECTIOUS A:   HCAP P:   It is confusing to me what abx she has been on.  Zosyn 10/8>>> Vanc start date unclear to me>>>  Blood 10/02 >> NTD Urine 10/02 >> NTD Sputum 10/02 >> normal flora  Blood 10/8>>> Sputum 10/8>>>  ENDOCRINE A:   Adrenal insufficiency. Necrotic adrenals seen on recent biopsy P:   Hydrocortisone  AUTOIMMUNE  ANCA >> PENDING DS DNA >> neg SCL70 >> neg RF >> 30 SSA/SSB >> neg IgG low IgA normal   A: R/O Autoimmune source effusion / skin changes, negative malignant work up thus far P: Assess autoimmune panel 10/7 Consider biopsy of left hand lesions if more stable.  NEUROLOGIC A:   Acute encephalopathy P:   Monitor mental status Hold lexapro  FAMILY:  Updated daughter over the phone 10/8, no family bedside 10/9. Healthcare providers  (various) concerned daughter might not be "realistic" about prognosis. GOC:  Full code. Dw son [name] - explained patient is heading into LTAC/chronic critical illness istuation. Says sister/dtr of patient resistant for DNR. Explained need to have palliative care discussion more as supportive and to delinate goals of care  as opposed to code status . He will d./w sister and get back to [place]    The patient is critically ill with multiple organ systems failure and requires high complexity decision making for assessment and support, frequent evaluation and titration of therapies, application of advanced monitoring technologies and extensive interpretation of multiple databases.   Critical Care Time devoted to patient care services described in this note is  40  Minutes. This time reflects time of care of this signee Dr Brand Males. This critical care time does not reflect procedure time, or teaching time or supervisory time of PA/NP/Med student/Med Resident etc but could involve care discussion time    Dr. Brand Males, M.D., [facility].C.P Pulmonary and Critical  Care Medicine Staff Physician [facility] Pager: [phone], If no answer or between  15:00h - 7:00h: call [phone]  04/29/2015 10:35 AM

## 2015-04-30 ENCOUNTER — Encounter (HOSPITAL_COMMUNITY): Payer: Self-pay | Admitting: General Surgery

## 2015-04-30 LAB — BLOOD GAS, ARTERIAL

## 2015-04-30 LAB — BASIC METABOLIC PANEL WITH GFR
Anion gap: 9 (ref 5–15)
BUN: 59 mg/dL — ABNORMAL HIGH (ref 6–20)
CO2: 21 mmol/L — ABNORMAL LOW (ref 22–32)
Calcium: 7.3 mg/dL — ABNORMAL LOW (ref 8.9–10.3)
Chloride: 108 mmol/L (ref 101–111)
Creatinine, Ser: 1.96 mg/dL — ABNORMAL HIGH (ref 0.44–1.00)
GFR calc Af Amer: 27 mL/min — ABNORMAL LOW
GFR calc non Af Amer: 23 mL/min — ABNORMAL LOW
Glucose, Bld: 148 mg/dL — ABNORMAL HIGH (ref 65–99)
Potassium: 3.2 mmol/L — ABNORMAL LOW (ref 3.5–5.1)
Sodium: 138 mmol/L (ref 135–145)

## 2015-04-30 LAB — CBC WITH DIFFERENTIAL/PLATELET
Basophils Absolute: 0 K/uL (ref 0.0–0.1)
Basophils Relative: 0 %
Eosinophils Absolute: 0 K/uL (ref 0.0–0.7)
Eosinophils Relative: 0 %
HCT: 23.9 % — ABNORMAL LOW (ref 36.0–46.0)
Hemoglobin: 7.8 g/dL — ABNORMAL LOW (ref 12.0–15.0)
Lymphocytes Relative: 2 %
Lymphs Abs: 0.2 K/uL — ABNORMAL LOW (ref 0.7–4.0)
MCH: 29.1 pg (ref 26.0–34.0)
MCHC: 32.6 g/dL (ref 30.0–36.0)
MCV: 89.2 fL (ref 78.0–100.0)
Monocytes Absolute: 0.2 K/uL (ref 0.1–1.0)
Monocytes Relative: 2 %
Neutro Abs: 8.2 K/uL — ABNORMAL HIGH (ref 1.7–7.7)
Neutrophils Relative %: 96 %
Platelets: 101 K/uL — ABNORMAL LOW (ref 150–400)
RBC: 2.68 MIL/uL — ABNORMAL LOW (ref 3.87–5.11)
RDW: 17.1 % — ABNORMAL HIGH (ref 11.5–15.5)
WBC: 8.6 K/uL (ref 4.0–10.5)

## 2015-04-30 LAB — CULTURE, RESPIRATORY: CULTURE: NO GROWTH

## 2015-04-30 LAB — PROTIME-INR
INR: 1.29 (ref 0.00–1.49)
Prothrombin Time: 16.2 s — ABNORMAL HIGH (ref 11.6–15.2)

## 2015-04-30 LAB — GLUCOSE, CAPILLARY
GLUCOSE-CAPILLARY: 142 mg/dL — AB (ref 65–99)
Glucose-Capillary: 135 mg/dL — ABNORMAL HIGH (ref 65–99)
Glucose-Capillary: 135 mg/dL — ABNORMAL HIGH (ref 65–99)
Glucose-Capillary: 140 mg/dL — ABNORMAL HIGH (ref 65–99)
Glucose-Capillary: 144 mg/dL — ABNORMAL HIGH (ref 65–99)

## 2015-04-30 LAB — CULTURE, RESPIRATORY W GRAM STAIN

## 2015-04-30 LAB — PHOSPHORUS: Phosphorus: 4.2 mg/dL (ref 2.5–4.6)

## 2015-04-30 LAB — VANCOMYCIN, RANDOM: VANCOMYCIN RM: 27 ug/mL

## 2015-04-30 LAB — LACTIC ACID, PLASMA: LACTIC ACID, VENOUS: 2.3 mmol/L — AB (ref 0.5–2.0)

## 2015-04-30 LAB — MAGNESIUM: Magnesium: 2.2 mg/dL (ref 1.7–2.4)

## 2015-04-30 MED ORDER — FUROSEMIDE 10 MG/ML IJ SOLN
20.0000 mg | Freq: Once | INTRAMUSCULAR | Status: AC
  Administered 2015-04-30: 20 mg via INTRAVENOUS
  Filled 2015-04-30: qty 2

## 2015-04-30 MED ORDER — LIDOCAINE-EPINEPHRINE 1 %-1:100000 IJ SOLN
10.0000 mL | Freq: Once | INTRAMUSCULAR | Status: AC
  Administered 2015-04-30: 10 mL
  Filled 2015-04-30: qty 10

## 2015-04-30 MED ORDER — LIDOCAINE HCL (CARDIAC) 20 MG/ML IV SOLN
INTRAVENOUS | Status: AC
  Filled 2015-04-30: qty 10

## 2015-04-30 MED ORDER — SODIUM CHLORIDE 0.9 % IV BOLUS (SEPSIS)
1000.0000 mL | Freq: Once | INTRAVENOUS | Status: AC
  Administered 2015-04-30: 1000 mL via INTRAVENOUS

## 2015-04-30 NOTE — Procedures (Signed)
Skin Biopsy Procedure Note  Pre-operative Diagnosis: diffuse purpura  Post-operative Diagnosis: same  Indications: 80 year old with diffuse ecchymotic lesions with bullae with suddenly erupted 1 day ago.   Anesthesia: 1% lidocaine with epinephrine  Procedure Details  The procedure, risks and complications have been discussed in detail (including, but not limited to, infection, bleeding) with the patients daughter [name], and a verbal consent to the procedure was signed.  The skin was sterilely prepped and draped over the affected area in the usual fashion. After adequate local anesthesia, I performed 2 27mm punch biopsies to left anterior thigh and specimens were placed in michel and formalin media.  Manual pressure applied.  Hemostasis was achieved and a 4x4 dressing was placed.  The patient was observed until stable.  Findings: same  EBL: none  Drains: none  Condition: Tolerated procedure well and Stable   Complications: none.  Zailynn Brandel, ANP-BC

## 2015-04-30 NOTE — Progress Notes (Signed)
eLink Physician-Brief Progress Note Patient Name: [name] [name] DOB: 1935/03/17 MRN: [id number]   Date of Service  04/30/2015  HPI/Events of Note  Oliguria - urine output 10 to 15 mL/hour.  BP = 121/44 (MAP = 65) and CVP = 8. LVEF = 55% to 60%.  eICU Interventions  Will bolus with 0.9 NaCl 1 liter IV over 1 hour now.      Intervention Category Intermediate Interventions: Oliguria - evaluation and management  Mikalyn Hermida Eugene 04/30/2015, 12:28 AM

## 2015-04-30 NOTE — Progress Notes (Signed)
79mls of fentanyl PCA wasted in med room with [employer], RN.  Rowe Pavy, RN

## 2015-04-30 NOTE — Progress Notes (Signed)
SLP Cancellation Note  Patient Details Name: Nelly Scriven MRN: [id number] DOB: 06-10-1935   Cancelled treatment:       Reason Eval/Treat Not Completed: Patient not medically ready, pt intubated.   Lanier Ensign 04/30/2015, 8:13 AM

## 2015-04-30 NOTE — Progress Notes (Signed)
ANTIBIOTIC CONSULT NOTE - Follow-up  Pharmacy Consult for Vancomycin  Indication: rule out sepsis/HAP  No Known Allergies  Labs:  Recent Labs  04/28/15 0435 04/29/15 0458 04/30/15 0500  WBC 13.2* 14.1* 8.6  HGB 11.0* 8.5* 7.8*  PLT 94* 114* 101*  CREATININE 1.53* 1.79* 1.96*   Estimated Creatinine Clearance: 26.7 mL/min (by C-G formula based on Cr of 1.96).  Recent Labs  04/28/15 2154 04/29/15 0459 04/30/15 0500  VANCOTROUGH 36*  --   --   VANCORANDOM  --  33 27     Assessment: [name] [name] who completed 13 days of vancomycin yesterday for HCAP/Sepsis. Initially presented with cough, SOB, febrile. CXR with LLL consolidation - Thoracentesis to right chest done to assess cell counts and infection analysis. She continues on vancomycin and zosyn. vancomycin 9/25>>10/7, restarted 10/8 > Zosyn 10/8 > ceftazidime 9/25>> 10/2  imipenem 10/2>>10/7   See cultures above  9/28 1330 VT: 6 (Of note: one dose was missed 9/27) - Increased from 500 q12 to 500 q8  9/30 VT = 23, drawn 1 hr late. Change to 1g q24h  10/4 VT = 15 on 1g Q 24 -> no change 10/9 VT = 36 10/10 VR = 33 10/11 VR =27  Goal of Therapy:  Vancomycin trough level 15-20 mcg/ml  Plan:  - Continue to hold vancomycin - Check a random AM level to assess clearance (level will likely still be elevated) - Check a BMET in AM to assess Scr trend  Thank you Anette Guarneri, PharmD [phone]   04/30/2015 10:23 AM

## 2015-04-30 NOTE — Consult Note (Signed)
Reason for Consult: diffuse purpura Referring Physician: Dr. Ann Lions     HPI: [name] [name] is a 80 year old female with a history of pancytopenia and splenomegaly s/p splenectomy and adrenal gland biopsy on 04/02/15 by Dr. Hassell Done, CHF, CAD, remote history of breast cancer admitted on 04/05/2015 with respiratory failure, HCAP and subsequently had a pericardial window on 9/27 for an effusion.  Required levophed which was stopped yesterday.  She was noted to have diffuse purpura yesterday.  Therefore we have been asked to evaluate.  The patient is awake on the ventilator.  Required reintubation.  Afebrile with a normal white count.  Past Medical History  Diagnosis Date  . Abnormal WBC count     low, h/o  . Other and unspecified hyperlipidemia   . Unspecified essential hypertension   . OP (osteoporosis)   . Vitamin D deficiency   . MGUS (monoclonal gammopathy of unknown significance) 03/16/2013  . Back pain   . Breast cancer ([place]) 1981    mastectomy and lymph node removal  . DM type 2 (diabetes mellitus, type 2) ([place])     Pt states she is 'prediabetic'    Past Surgical History  Procedure Laterality Date  . Mastectomy      left  . Breast biopsy      right  . Breast reconstruction    . Skin cancer removal      basal cell skin cancer  . Wisdom tooth extraction    . Tooth extraction    . Laparoscopic splenectomy N/A 04/05/2015    Procedure: LAPAROSCOPIC SPLENECTOMY;  Surgeon: Johnathan Hausen, MD;  Location: [facility];  Service: General;  Laterality: N/A;  . Subxyphoid pericardial window N/A 04/07/2015    Procedure: SUBXYPHOID PERICARDIAL WINDOW;  Surgeon: Ivin Poot, MD;  Location: [place];  Service: Thoracic;  Laterality: N/A;  . Chest tube insertion Left 03/27/2015    Procedure: CHEST TUBE INSERTION;  Surgeon: Ivin Poot, MD;  Location: [place];  Service: Thoracic;  Laterality: Left;  . Tee without cardioversion N/A 04/18/2015    Procedure: TRANSESOPHAGEAL ECHOCARDIOGRAM (TEE);   Surgeon: Ivin Poot, MD;  Location: [facility] OR;  Service: Thoracic;  Laterality: N/A;    Family History  Problem Relation Age of Onset  . Liver cancer Father   . Diabetes Father     Social History:  reports that she quit smoking about 48 years ago. Her smoking use included Cigarettes. She started smoking about 58 years ago. She has a 10 pack-year smoking history. She has never used smokeless tobacco. She reports that she does not drink alcohol or use illicit drugs.  Allergies: No Known Allergies  Medications: No current facility-administered medications on file prior to encounter.   Current Outpatient Prescriptions on File Prior to Encounter  Medication Sig Dispense Refill  . acetaminophen (TYLENOL) 325 MG tablet Take 2 tablets (650 mg total) by mouth every 6 (six) hours as needed for mild pain, moderate pain, fever or headache (or Fever >/= 101).    [name] escitalopram (LEXAPRO) 10 MG tablet Take 5 mg by mouth every morning.     . feeding supplement, ENSURE ENLIVE, (ENSURE ENLIVE) LIQD Take 237 mLs by mouth 2 (two) times daily between meals. 237 mL 12  . fish oil-omega-3 fatty acids 1000 MG capsule Take 2 g by mouth every morning.     . folic acid (FOLVITE) 1 MG tablet Take 2 tablets (2 mg total) by mouth daily.    . Multiple Vitamin (MULTIVITAMIN)  capsule Take 1 capsule by mouth every morning.     . ondansetron (ZOFRAN-ODT) 4 MG disintegrating tablet Take 1 tablet (4 mg total) by mouth every 8 (eight) hours as needed for nausea or vomiting.    . pantoprazole (PROTONIX) 40 MG tablet Take 1 tablet (40 mg total) by mouth at bedtime. 30 tablet 0  . polyethylene glycol (MIRALAX / GLYCOLAX) packet Take 17 g by mouth daily. 14 each 0  . raloxifene (EVISTA) 60 MG tablet Take 60 mg by mouth every morning.     . rosuvastatin (CRESTOR) 20 MG tablet Take 10 mg by mouth every morning.     . simethicone (MYLICON) 80 MG chewable tablet Chew 0.5 tablets (40 mg total) by mouth every 6 (six) hours as needed  for flatulence (bloating). 30 tablet 0  . Vitamin D, Ergocalciferol, (DRISDOL) 50000 UNITS CAPS Take 50,000 Units by mouth every Monday.        Results for orders placed or performed during the hospital encounter of 03/22/2015 (from the past 48 hour(s))  Glucose, capillary     Status: Abnormal   Collection Time: 04/28/15 11:06 AM  Result Value Ref Range   Glucose-Capillary 216 (H) 65 - 99 mg/dL  I-STAT 3, arterial blood gas (G3+)     Status: Abnormal   Collection Time: 04/28/15  3:20 PM  Result Value Ref Range   pH, Arterial 7.332 (L) 7.350 - 7.450   pCO2 arterial 38.8 35.0 - 45.0 mmHg   pO2, Arterial 229.0 (H) 80.0 - 100.0 mmHg   Bicarbonate 20.6 20.0 - 24.0 mEq/L   TCO2 22 0 - 100 mmol/L   O2 Saturation 100.0 %   Acid-base deficit 5.0 (H) 0.0 - 2.0 mmol/L   Patient temperature 98.3 F    Sample type ARTERIAL   Glucose, capillary     Status: Abnormal   Collection Time: 04/28/15  3:23 PM  Result Value Ref Range   Glucose-Capillary 157 (H) 65 - 99 mg/dL   Comment 1 Arterial Specimen    Comment 2 Notify RN   Glucose, capillary     Status: Abnormal   Collection Time: 04/28/15  7:44 PM  Result Value Ref Range   Glucose-Capillary 136 (H) 65 - 99 mg/dL   Comment 1 Capillary Specimen   Vancomycin, trough     Status: Abnormal   Collection Time: 04/28/15  9:54 PM  Result Value Ref Range   Vancomycin Tr 36 (HH) 10.0 - 20.0 ug/mL    Comment: CRITICAL RESULT CALLED TO, READ BACK BY AND VERIFIED WITH: ROLLS,M RN 04/29/2015 0122 JORDANS   Glucose, capillary     Status: Abnormal   Collection Time: 04/28/15 11:45 PM  Result Value Ref Range   Glucose-Capillary 138 (H) 65 - 99 mg/dL   Comment 1 Capillary Specimen   Blood gas, arterial     Status: Abnormal   Collection Time: 04/29/15  4:05 AM  Result Value Ref Range   FIO2 0.50    Delivery systems VENTILATOR    Mode PRESSURE REGULATED VOLUME CONTROL    VT 390 mL   LHR 22 resp/min   Peep/cpap 5.0 cm H20   pH, Arterial 7.330 (L) 7.350 -  7.450   pCO2 arterial 39.6 35.0 - 45.0 mmHg   pO2, Arterial 194 (H) 80.0 - 100.0 mmHg   Bicarbonate 20.3 20.0 - 24.0 mEq/L   TCO2 21.5 0 - 100 mmol/L   Acid-base deficit 4.6 (H) 0.0 - 2.0 mmol/L   O2 Saturation 99.2 %  Patient temperature 98.6    Collection site A-LINE    Drawn by [phone]    Sample type ARTERIAL DRAW    Allens test (pass/fail) PASS PASS  Glucose, capillary     Status: Abnormal   Collection Time: 04/29/15  4:21 AM  Result Value Ref Range   Glucose-Capillary 113 (H) 65 - 99 mg/dL   Comment 1 Venous Specimen    Comment 2 Notify RN    Comment 3 Document in Chart   CBC     Status: Abnormal   Collection Time: 04/29/15  4:58 AM  Result Value Ref Range   WBC 14.1 (H) 4.0 - 10.5 K/uL    Comment: WHITE COUNT CONFIRMED ON SMEAR   RBC 2.87 (L) 3.87 - 5.11 MIL/uL   Hemoglobin 8.5 (L) 12.0 - 15.0 g/dL    Comment: REPEATED TO VERIFY   HCT 26.3 (L) 36.0 - 46.0 %   MCV 91.6 78.0 - 100.0 fL   MCH 29.6 26.0 - 34.0 pg   MCHC 32.3 30.0 - 36.0 g/dL   RDW 16.8 (H) 11.5 - 15.5 %   Platelets 114 (L) 150 - 400 K/uL    Comment: PLATELET COUNT CONFIRMED BY SMEAR  Basic metabolic panel     Status: Abnormal   Collection Time: 04/29/15  4:58 AM  Result Value Ref Range   Sodium 138 135 - 145 mmol/L   Potassium 3.7 3.5 - 5.1 mmol/L   Chloride 108 101 - 111 mmol/L   CO2 20 (L) 22 - 32 mmol/L   Glucose, Bld 130 (H) 65 - 99 mg/dL   BUN 50 (H) 6 - 20 mg/dL   Creatinine, Ser 1.79 (H) 0.44 - 1.00 mg/dL   Calcium 7.5 (L) 8.9 - 10.3 mg/dL   GFR calc non Af Amer 26 (L) >60 mL/min   GFR calc Af Amer 30 (L) >60 mL/min    Comment: (NOTE) The eGFR has been calculated using the CKD EPI equation. This calculation has not been validated in all clinical situations. eGFR's persistently <60 mL/min signify possible Chronic Kidney Disease.    Anion gap 10 5 - 15  Magnesium     Status: None   Collection Time: 04/29/15  4:58 AM  Result Value Ref Range   Magnesium 2.2 1.7 - 2.4 mg/dL  Phosphorus      Status: Abnormal   Collection Time: 04/29/15  4:58 AM  Result Value Ref Range   Phosphorus 4.9 (H) 2.5 - 4.6 mg/dL  Vancomycin, random     Status: None   Collection Time: 04/29/15  4:59 AM  Result Value Ref Range   Vancomycin Rm 33 ug/mL    Comment:        Random Vancomycin therapeutic range is dependent on dosage and time of specimen collection. A peak range is 20.0-40.0 ug/mL A trough range is 5.0-15.0 ug/mL          Glucose, capillary     Status: Abnormal   Collection Time: 04/29/15  7:53 AM  Result Value Ref Range   Glucose-Capillary 59 (L) 65 - 99 mg/dL   Comment 1 Notify RN   Glucose, capillary     Status: Abnormal   Collection Time: 04/29/15  7:59 AM  Result Value Ref Range   Glucose-Capillary 113 (H) 65 - 99 mg/dL   Comment 1 Notify RN   Glucose, capillary     Status: Abnormal   Collection Time: 04/29/15 11:52 AM  Result Value Ref Range   Glucose-Capillary 128 (H) 65 -  99 mg/dL  Glucose, capillary     Status: Abnormal   Collection Time: 04/29/15  3:07 PM  Result Value Ref Range   Glucose-Capillary 124 (H) 65 - 99 mg/dL  Glucose, capillary     Status: Abnormal   Collection Time: 04/29/15  8:01 PM  Result Value Ref Range   Glucose-Capillary 122 (H) 65 - 99 mg/dL   Comment 1 Notify RN    Comment 2 Document in Chart   Glucose, capillary     Status: Abnormal   Collection Time: 04/30/15 12:31 AM  Result Value Ref Range   Glucose-Capillary 135 (H) 65 - 99 mg/dL   Comment 1 Notify RN    Comment 2 Document in Chart   Glucose, capillary     Status: Abnormal   Collection Time: 04/30/15  3:57 AM  Result Value Ref Range   Glucose-Capillary 135 (H) 65 - 99 mg/dL   Comment 1 Arterial Specimen   Vancomycin, random     Status: None   Collection Time: 04/30/15  5:00 AM  Result Value Ref Range   Vancomycin Rm 27 ug/mL    Comment:        Random Vancomycin therapeutic range is dependent on dosage and time of specimen collection. A peak range is 20.0-40.0 ug/mL A trough  range is 5.0-15.0 ug/mL          Basic metabolic panel     Status: Abnormal   Collection Time: 04/30/15  5:00 AM  Result Value Ref Range   Sodium 138 135 - 145 mmol/L   Potassium 3.2 (L) 3.5 - 5.1 mmol/L   Chloride 108 101 - 111 mmol/L   CO2 21 (L) 22 - 32 mmol/L   Glucose, Bld 148 (H) 65 - 99 mg/dL   BUN 59 (H) 6 - 20 mg/dL   Creatinine, Ser 1.96 (H) 0.44 - 1.00 mg/dL   Calcium 7.3 (L) 8.9 - 10.3 mg/dL   GFR calc non Af Amer 23 (L) >60 mL/min   GFR calc Af Amer 27 (L) >60 mL/min    Comment: (NOTE) The eGFR has been calculated using the CKD EPI equation. This calculation has not been validated in all clinical situations. eGFR's persistently <60 mL/min signify possible Chronic Kidney Disease.    Anion gap 9 5 - 15  CBC with Differential/Platelet     Status: Abnormal   Collection Time: 04/30/15  5:00 AM  Result Value Ref Range   WBC 8.6 4.0 - 10.5 K/uL    Comment: WHITE COUNT CONFIRMED ON SMEAR WHITE COUNT CONFIRMED ON SMEAR    RBC 2.68 (L) 3.87 - 5.11 MIL/uL   Hemoglobin 7.8 (L) 12.0 - 15.0 g/dL    Comment: REPEATED TO VERIFY   HCT 23.9 (L) 36.0 - 46.0 %   MCV 89.2 78.0 - 100.0 fL   MCH 29.1 26.0 - 34.0 pg   MCHC 32.6 30.0 - 36.0 g/dL   RDW 17.1 (H) 11.5 - 15.5 %   Platelets 101 (L) 150 - 400 K/uL    Comment: PLATELET COUNT CONFIRMED BY SMEAR REPEATED TO VERIFY    Neutrophils Relative % 96 %   Lymphocytes Relative 2 %   Monocytes Relative 2 %   Eosinophils Relative 0 %   Basophils Relative 0 %   Neutro Abs 8.2 (H) 1.7 - 7.7 K/uL   Lymphs Abs 0.2 (L) 0.7 - 4.0 K/uL   Monocytes Absolute 0.2 0.1 - 1.0 K/uL   Eosinophils Absolute 0.0 0.0 - 0.7 K/uL  Basophils Absolute 0.0 0.0 - 0.1 K/uL   RBC Morphology RARE NRBCs     Comment: POLYCHROMASIA PRESENT TARGET CELLS PAPPENHEIMER BODIES   Magnesium     Status: None   Collection Time: 04/30/15  5:00 AM  Result Value Ref Range   Magnesium 2.2 1.7 - 2.4 mg/dL  Phosphorus     Status: None   Collection Time: 04/30/15   5:00 AM  Result Value Ref Range   Phosphorus 4.2 2.5 - 4.6 mg/dL  Protime-INR     Status: Abnormal   Collection Time: 04/30/15  5:00 AM  Result Value Ref Range   Prothrombin Time 16.2 (H) 11.6 - 15.2 seconds   INR 1.29 0.00 - 1.49  Lactic acid, plasma     Status: Abnormal   Collection Time: 04/30/15  5:00 AM  Result Value Ref Range   Lactic Acid, Venous 2.3 (HH) 0.5 - 2.0 mmol/L    Comment: CRITICAL RESULT CALLED TO, READ BACK BY AND VERIFIED WITH: PERRIN,J RN 04/30/2015 0601 JORDANS   Glucose, capillary     Status: Abnormal   Collection Time: 04/30/15  8:28 AM  Result Value Ref Range   Glucose-Capillary 140 (H) 65 - 99 mg/dL   Comment 1 Arterial Specimen    Comment 2 Notify RN     Dg Chest Port 1 View  04/29/2015   CLINICAL DATA:  Respiratory failure, ventilator dependent, CHF, monoclonal gammopathy  EXAM: PORTABLE CHEST 1 VIEW  COMPARISON:  Portable chest x-ray of April 28, 2015 and April 27, 2015.  FINDINGS: The lungs are well-expanded. The tiny left apical pneumothorax is not evident today. The left-sided chest tube is unchanged in position. There is no significant left pleural effusion. There is small right pleural effusion. Alveolar opacity persists at the right lung base. The heart is normal in size. The pulmonary vascularity is not engorged. The endotracheal tube tip lies 2.3 cm above the carina. The esophagogastric tube tip projects below the inferior margin of the image. The left internal jugular venous catheter tip projects over the midportion of the SVC.  IMPRESSION: No left pneumothorax visible today. Persistent small right pleural effusion with subtle parenchymal opacity at the right lung base, stable. The support tubes are in reasonable position.   Electronically Signed   By: David  Martinique M.D.   On: 04/29/2015 07:15    Review of Systems  All other systems reviewed and are negative.  Blood pressure 144/50, pulse 92, temperature 98 F (36.7 C), temperature source  Axillary, resp. rate 23, height 5' 8.5" (1.74 m), weight 81 kg (178 lb 9.2 oz), SpO2 100 %. Physical Exam  Constitutional: No distress.  Cardiovascular: Normal rate, regular rhythm, normal heart sounds and intact distal pulses.  Exam reveals no gallop and no friction rub.   No murmur heard. Respiratory: Breath sounds normal.  GI: Soft. Bowel sounds are normal.  Midline incision-staples in place, no purpura noted around the wound and flank region.   Skin: She is not diaphoretic.  Diffuse non blanchable purpura with bullae. Most appreciated to extremities.      Assessment/Plan: Diffuse purpura-d/w pathology recommends 2 biopsies, one in michel media and one in formalin.  Procedure risks discussed with the patient's daughter [name] which include infection, bleeding, poor wound healing.  She verbalizes understanding and wishes to proceed.  Plan to obtain a procedure this afternoon. S/p splenectomy-Dr. Hassell Done 04/05/15. DC staples  Erby Pian ANP-BC Pager [phone] 04/30/2015, 9:57 AM

## 2015-04-30 NOTE — Progress Notes (Addendum)
PULMONARY / CRITICAL CARE MEDICINE   Name: [name] [name] MRN: [id number] DOB: 1935/05/11    ADMISSION DATE:  04/19/2015  LOS 16 days   REFERRING MD :  [facility]  CHIEF COMPLAINT:  Cough  INITIAL PRESENTATION:  80 yo admitted with cough and fever from sepsis and HCAP.  She was found to have cardiac tamponade.  She has hx of breast cancer with possible metastatic disease, MGUS.  STUDIES/EVENT:  02/20/15 - bone marrow - predom CD8 cells 04/03/15 - Left adrenal bx  - extensive necrosis  04/05/15 - splenectomy bx -  ...Marland KitchenMarland KitchenMarland Kitchen 09/25  Admit, oncology consult 9/26  Thoracentesis  9/26  CT chest >> b/l pleural effusions, large pericardial effusion, 2.3 cm RLL nodule (was 1.8 cm) 9/26  ECHO >> tamponade  09/26  Cardiology consulted 09/27  TCTS consulted >> subxyphoid pericardial window. PERICARDIAL BIOPSY - NO EVIDENCE OF MALIGNANCY 09/28  Extubated 10/116 - echo - nromal EF 10/02  Fever, altered mental status >>To ICU. Intubated 10/03  NSVT and torsades.  10/6  MRI Head >> negative for acute pathology  10/06  Extubated  10/07  Reintubated for hypoxemia and AMS. AUTOIMMUNE NEGATIVE 04/28/15: : Reintubated overnight, mottled overnight with high pressor demand. 04/29/15: Off levophed. RN very concerned abotu extensive dermal necrosis all over body. Overall very weak. Follows commands. Looks very decondtioned. On fent gtt    SUBJECTIVE/OVERNIGHT/INTERVAL HX 04/30/2015 - off sedation gtt x 24h. Off pressors x 24h. Following commands. SBT just started. RN denies overnight issues other than general weakness and persistence of derm lesions. Tolerating tube feeds. However, creat rising to 1.9  VITAL SIGNS: Temp:  [96.4 F (35.8 C)-98.3 F (36.8 C)] 98 F (36.7 C) (10/11 0831) Pulse Rate:  [75-137] 92 (10/11 0923) Resp:  [16-24] 23 (10/11 0923) BP: (88-144)/(34-71) 144/50 mmHg (10/11 0923) SpO2:  [100 %] 100 % (10/11 0923) Arterial Line BP: (97-146)/(35-55) 129/46 mmHg (10/11 0800) FiO2 (%):  [40 %]  40 % (10/11 0923) Weight:  [81 kg (178 lb 9.2 oz)] 81 kg (178 lb 9.2 oz) (10/11 0600)   HEMODYNAMICS: CVP:  [5 mmHg-10 mmHg] 6 mmHg   VENTILATOR SETTINGS: Vent Mode:  [-] PSV;CPAP FiO2 (%):  [40 %] 40 % Set Rate:  [22 bmp] 22 bmp Vt Set:  [390 mL] 390 mL PEEP:  [5 cmH20] 5 cmH20 Pressure Support:  [5 cmH20] 5 cmH20 Plateau Pressure:  [16 cmH20-18 cmH20] 16 cmH20   INTAKE / OUTPUT:  Intake/Output Summary (Last 24 hours) at 04/30/15 0926 Last data filed at 04/30/15 0800  Gross per 24 hour  Intake   3000 ml  Output   1285 ml  Net   1715 ml   PHYSICAL EXAMINATION: General: chronically ill appearing female  Neuro:  RASS 0. Deconditioned. Does not appear delirious. Follows commands Cardiovascular:  S1, S2, No MRG. Lungs:  Even/non-labored, coarse BS diffusely. Abdomen:  Soft, non tender, BSx4 active  Musculoskeletal:  No acute deformities Skin:  Extensive epidram necrosis like lesions across forearm, thigh and even nose   LABS:  PULMONARY  Recent Labs Lab 04/27/15 0300  04/27/15 0913 04/27/15 2105 04/27/15 2116 04/28/15 1520 04/29/15 0405  PHART 7.574*  --  7.411  --  7.222* 7.332* 7.330*  PCO2ART 26.1*  --  37.8  --  45.6* 38.8 39.6  PO2ART 196.0*  --  39.0*  --  47.0* 229.0* 194*  HCO3 24.3*  --  23.5 18.4* 18.7* 20.6 20.3  TCO2 25  --  25 20 20 22  21.5  O2SAT 100.0  < > 67.0 50.0 74.0 100.0 99.2  < > = values in this interval not displayed.  CBC  Recent Labs Lab 04/28/15 0435 04/29/15 0458 04/30/15 0500  HGB 11.0* 8.5* 7.8*  HCT 33.8* 26.3* 23.9*  WBC 13.2* 14.1* 8.6  PLT 94* 114* 101*    COAGULATION  Recent Labs Lab 04/27/15 0329 04/30/15 0500  INR 1.77* 1.29    CARDIAC  No results for input(s): TROPONINI in the last 168 hours. No results for input(s): PROBNP in the last 168 hours.   CHEMISTRY  Recent Labs Lab 04/25/15 0534 04/26/15 0520 04/27/15 0330 04/28/15 0435 04/29/15 0458 04/30/15 0500  NA 133* 139 142 139 138 138  K  3.3* 3.6 3.7 4.4 3.7 3.2*  CL 99* 105 109 106 108 108  CO2 25 26 25  20* 20* 21*  GLUCOSE 119* 85 84 202* 130* 148*  BUN 24* 21* 21* 36* 50* 59*  CREATININE 0.86 0.65 0.88 1.53* 1.79* 1.96*  CALCIUM 7.3* 7.3* 7.6* 7.3* 7.5* 7.3*  MG 1.7 1.6*  --  2.4 2.2 2.2  PHOS 2.8 2.8  --  5.9* 4.9* 4.2   Estimated Creatinine Clearance: 26.7 mL/min (by C-G formula based on Cr of 1.96).   LIVER  Recent Labs Lab 04/25/15 0534 04/27/15 0329 04/30/15 0500  ALBUMIN 1.4*  --   --   INR  --  1.77* 1.29     INFECTIOUS  Recent Labs Lab 04/25/15 1055 04/26/15 0520 04/27/15 0330 04/27/15 0715 04/30/15 0500  LATICACIDVEN  --   --  2.9* 2.4* 2.3*  PROCALCITON 1.82 1.00 0.88  --   --      ENDOCRINE CBG (last 3)   Recent Labs  04/30/15 0031 04/30/15 0357 04/30/15 0828  GLUCAP 135* 135* 140*         IMAGING x48h  - image(s) personally visualized  -   highlighted in bold Dg Chest Port 1 View  04/29/2015   CLINICAL DATA:  Respiratory failure, ventilator dependent, CHF, monoclonal gammopathy  EXAM: PORTABLE CHEST 1 VIEW  COMPARISON:  Portable chest x-ray of April 28, 2015 and April 27, 2015.  FINDINGS: The lungs are well-expanded. The tiny left apical pneumothorax is not evident today. The left-sided chest tube is unchanged in position. There is no significant left pleural effusion. There is small right pleural effusion. Alveolar opacity persists at the right lung base. The heart is normal in size. The pulmonary vascularity is not engorged. The endotracheal tube tip lies 2.3 cm above the carina. The esophagogastric tube tip projects below the inferior margin of the image. The left internal jugular venous catheter tip projects over the midportion of the SVC.  IMPRESSION: No left pneumothorax visible today. Persistent small right pleural effusion with subtle parenchymal opacity at the right lung base, stable. The support tubes are in reasonable position.   Electronically Signed   By: David   Martinique M.D.   On: 04/29/2015 07:15       ASSESSMENT / PLAN:  PULMONARY A: Acute hypoxic respiratory failure. Rt lung nodule. Pleural effusions - thora 9/26 with negative cultures ETT 10/2>>>10/6>>>10/7>>>   - failure to wean; currently due to decondituioning  P:   Daily SBT - needs trach/LTAC if family decides to pursue full code Continue full vent support Will need a biopsy of the RLL opacity at some point when stable for work up of malignancy. Oxygen as needed to support saturations > 92%  CARDIOVASCULAR Lt IJ CVL 9/27>>>  A:  Pericardial  effusion with tamponade s/p window 9/27 with resultant chest tube. Nondiagnostic bx Hx of diastolic CHF, CAD, HLD. NSVT and torsades - none since 10/3 On hydrocort since 04/27/15 plasma cortisol of 0.6, definitely adrenally insufficient.    - off pressors since 04/29/15 AM P:  Monitor hemodynamics. Continue solucortef, Chest tube management per CVTS  RENAL A:   AKI.  - getting worse   P:   Test dose lasix to see if creat will improve Monitor renal fx. Replace electrolytes as needed. BMET in AM. Avoid nephrotix drugs  GASTROINTESTINAL A:   Nutrition.  - tolerating tube feeds  P:   Tube feeds SLP following. PPI - home medication.  HEMATOLOGIC A:   Hx of splenectomy, MGUS, breast cancer. Thrombocytopenia - may be secondary to stress, sepsis   - nil acute  P:  Will try to reach Dr Olevia Perches for diagnosis/prognosis Trend CBC  Monitor for bleeding  SCD's for DVT prophylaxis   INFECTIOUS   Blood 10/02 >> NTD Urine 10/02 >> NTD Sputum 10/02 >> normal flora  Blood 10/8>>> Sputum 10/8>>>     A:   HCAP P:   Zosyn 10/8>>> Vanc start date unclear to me>>>  ENDOCRINE A:   Adrenal insufficiency. Necrotic adrenals seen on recent biopsy P:   Hydrocortisone  AUTOIMMUNE ANCA >> NEG DS DNA >> neg SCL70 >> neg RF >> 30 SSA/SSB >> neg IgG low IgA normal   A: No evidence of major autoimmune dz -  04/26/15 panel.  negative malignant work up thus far Ongoing skin lesions +  P:  biopsy of skinlesions if more stable. -> d/w Emina Roerbhock [facility] APP  NEUROLOGIC A:   Acute encephalopathy   - improving/esolved  P:   Prn fent and versed Monitor mental status Hold lexapro   ONCOLOGY A: No evidence of ongoing current malignancy . There is only hx of remote breast cancer > 20 years ago - d/w Dr Marin Olp P  - uncertain prognosis. No terminal dx found  Overall  - imprioving. Do SBT but not extubatioin. Await pall care for goals of care.. D/w Dr Marin Olp - no terminal diagnosis found   FAMILY:  Updated daughter over the phone 10/8, no family bedside 10/9. Healthcare providers  (various) concerned daughter might not be "realistic" about prognosis. GOC:  Full code. Dw son [name] 04/29/15  - explained patient is heading into LTAC/chronic critical illness istuation. Says sister/dtr of patient resistant for DNR. Explained need to have palliative care discussion more as supportive and to delinate goals of care  as opposed to code status . They both are agreeable to meet iwht pall care for goals 05/01/15 at 17.45 - I placed call to d/w Pall care MD - awaiting to hear from them   The patient is critically ill with multiple organ systems failure and requires high complexity decision making for assessment and support, frequent evaluation and titration of therapies, application of advanced monitoring technologies and extensive interpretation of multiple databases.   Critical Care Time devoted to patient care services described in this note is  35 Minutes. This time reflects time of care of this signee Dr Brand Males. This critical care time does not reflect procedure time, or teaching time or supervisory time of PA/NP/Med student/Med Resident etc but could involve care discussion time    Dr. Brand Males, M.D., [facility].C.P Pulmonary and Critical Care Medicine Staff Physician [place] [facility] Pager: [phone], If no answer or between  15:00h - 7:00h: call [phone]  04/30/2015 9:26 AM

## 2015-05-01 ENCOUNTER — Encounter (HOSPITAL_COMMUNITY): Payer: Self-pay | Admitting: *Deleted

## 2015-05-01 ENCOUNTER — Inpatient Hospital Stay (HOSPITAL_COMMUNITY): Payer: Medicare Other

## 2015-05-01 DIAGNOSIS — Z515 Encounter for palliative care: Secondary | ICD-10-CM

## 2015-05-01 DIAGNOSIS — Z7189 Other specified counseling: Secondary | ICD-10-CM

## 2015-05-01 DIAGNOSIS — N179 Acute kidney failure, unspecified: Secondary | ICD-10-CM

## 2015-05-01 DIAGNOSIS — R58 Hemorrhage, not elsewhere classified: Secondary | ICD-10-CM

## 2015-05-01 LAB — BASIC METABOLIC PANEL
ANION GAP: 9 (ref 5–15)
Anion gap: 11 (ref 5–15)
BUN: 64 mg/dL — AB (ref 6–20)
BUN: 65 mg/dL — ABNORMAL HIGH (ref 6–20)
CALCIUM: 7.4 mg/dL — AB (ref 8.9–10.3)
CHLORIDE: 109 mmol/L (ref 101–111)
CO2: 22 mmol/L (ref 22–32)
CO2: 22 mmol/L (ref 22–32)
CREATININE: 1.99 mg/dL — AB (ref 0.44–1.00)
Calcium: 7.5 mg/dL — ABNORMAL LOW (ref 8.9–10.3)
Chloride: 108 mmol/L (ref 101–111)
Creatinine, Ser: 1.98 mg/dL — ABNORMAL HIGH (ref 0.44–1.00)
GFR calc Af Amer: 27 mL/min — ABNORMAL LOW (ref >60)
GFR calc non Af Amer: 23 mL/min — ABNORMAL LOW (ref >60)
GFR, EST AFRICAN AMERICAN: 27 mL/min — AB (ref >60)
GFR, EST NON AFRICAN AMERICAN: 23 mL/min — AB (ref >60)
GLUCOSE: 150 mg/dL — AB (ref 65–99)
Glucose, Bld: 130 mg/dL — ABNORMAL HIGH (ref 65–99)
Potassium: 2.8 mmol/L — ABNORMAL LOW (ref 3.5–5.1)
Potassium: 2.9 mmol/L — ABNORMAL LOW (ref 3.5–5.1)
SODIUM: 141 mmol/L (ref 135–145)
Sodium: 140 mmol/L (ref 135–145)

## 2015-05-01 LAB — LACTIC ACID, PLASMA: Lactic Acid, Venous: 2.3 mmol/L (ref 0.5–2.0)

## 2015-05-01 LAB — URINALYSIS W MICROSCOPIC (NOT AT ARMC)
Bilirubin Urine: NEGATIVE
Glucose, UA: NEGATIVE mg/dL
Ketones, ur: NEGATIVE mg/dL
Leukocytes, UA: NEGATIVE
Nitrite: NEGATIVE
PH: 5.5 (ref 5.0–8.0)
Protein, ur: 30 mg/dL — AB
SPECIFIC GRAVITY, URINE: 1.023 (ref 1.005–1.030)
UROBILINOGEN UA: 0.2 mg/dL (ref 0.0–1.0)

## 2015-05-01 LAB — PHOSPHORUS: Phosphorus: 3.8 mg/dL (ref 2.5–4.6)

## 2015-05-01 LAB — CBC WITH DIFFERENTIAL/PLATELET
BASOS PCT: 0 %
Basophils Absolute: 0 10*3/uL (ref 0.0–0.1)
EOS ABS: 0 10*3/uL (ref 0.0–0.7)
Eosinophils Relative: 0 %
HCT: 24.2 % — ABNORMAL LOW (ref 36.0–46.0)
Hemoglobin: 7.9 g/dL — ABNORMAL LOW (ref 12.0–15.0)
Lymphocytes Relative: 4 %
Lymphs Abs: 0.3 10*3/uL — ABNORMAL LOW (ref 0.7–4.0)
MCH: 29.2 pg (ref 26.0–34.0)
MCHC: 32.6 g/dL (ref 30.0–36.0)
MCV: 89.3 fL (ref 78.0–100.0)
MONO ABS: 0.4 10*3/uL (ref 0.1–1.0)
Monocytes Relative: 5 %
NEUTROS ABS: 6.8 10*3/uL (ref 1.7–7.7)
NEUTROS PCT: 91 %
PLATELETS: 110 10*3/uL — AB (ref 150–400)
RBC: 2.71 MIL/uL — ABNORMAL LOW (ref 3.87–5.11)
RDW: 17.2 % — ABNORMAL HIGH (ref 11.5–15.5)
WBC: 7.5 10*3/uL (ref 4.0–10.5)

## 2015-05-01 LAB — CREATININE, URINE, RANDOM: CREATININE, URINE: 57.91 mg/dL

## 2015-05-01 LAB — GLUCOSE, CAPILLARY
GLUCOSE-CAPILLARY: 131 mg/dL — AB (ref 65–99)
GLUCOSE-CAPILLARY: 131 mg/dL — AB (ref 65–99)
GLUCOSE-CAPILLARY: 170 mg/dL — AB (ref 65–99)
Glucose-Capillary: 117 mg/dL — ABNORMAL HIGH (ref 65–99)
Glucose-Capillary: 132 mg/dL — ABNORMAL HIGH (ref 65–99)
Glucose-Capillary: 156 mg/dL — ABNORMAL HIGH (ref 65–99)

## 2015-05-01 LAB — MAGNESIUM: MAGNESIUM: 2 mg/dL (ref 1.7–2.4)

## 2015-05-01 LAB — VANCOMYCIN, RANDOM: Vancomycin Rm: 22 ug/mL

## 2015-05-01 LAB — SODIUM, URINE, RANDOM

## 2015-05-01 MED ORDER — POTASSIUM CHLORIDE 10 MEQ/100ML IV SOLN
10.0000 meq | INTRAVENOUS | Status: AC
  Administered 2015-05-01 (×4): 10 meq via INTRAVENOUS
  Filled 2015-05-01 (×4): qty 100

## 2015-05-01 MED ORDER — POTASSIUM CHLORIDE 10 MEQ/50ML IV SOLN
10.0000 meq | INTRAVENOUS | Status: AC
  Administered 2015-05-01 (×2): 10 meq via INTRAVENOUS
  Filled 2015-05-01 (×2): qty 50

## 2015-05-01 MED ORDER — SODIUM CHLORIDE 0.9 % IV BOLUS (SEPSIS)
500.0000 mL | Freq: Once | INTRAVENOUS | Status: AC
  Administered 2015-05-01: 500 mL via INTRAVENOUS

## 2015-05-01 NOTE — Progress Notes (Signed)
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 Brand Males, M.D., [facility].C.P Pulmonary and Critical Care Medicine Staff Physician [place] [facility] Pager: [phone], If no answer or between  15:00h - 7:00h: call [phone]  05/01/2015 1:01 PM

## 2015-05-01 NOTE — Progress Notes (Signed)
Patient ID: [name] [name], female   DOB: January 21, 1935, 80 y.o.   MRN: [id number]     [address] [place], [place] [address]    Phone: [phone] FAX: [phone]     Subjective: Pt on vent.   Objective:  Vital signs:  Filed Vitals:   05/01/15 0500 05/01/15 0600 05/01/15 0700 05/01/15 0717  BP: 102/45 113/55 93/47   Pulse: 69 77 72   Temp:    97.5 F (36.4 C)  TempSrc:    Oral  Resp: _0 Height:      Weight: 81.3 kg (179 lb 3.7 oz)     SpO2: 100% 100% 100%     Last BM Date: 04/30/15  Intake/Output   Yesterday:  10/11 0701 - 10/12 0700 In: 1890 [I.V.:220; NG/GT:1520; IV Piggyback:150] Out: 3419 [Urine:620; Stool:975; Chest Tube:70] This shift:     Physical Exam: General: Pt awake on vent.  NAD. Skin: diffuse purpura with scattered bullae.  Left thigh wounds x2 drain serious output.   Problem List:   Principal Problem:   Pericardial tamponade Active Problems:   MGUS (monoclonal gammopathy of unknown significance)   Lung nodule   Adrenal nodule (HCC)   Chronic diastolic heart failure (HCC)   Malnutrition of moderate degree (HCC)   Generalized weakness   HLD (hyperlipidemia)   S/P laparoscopic splenectomy   HCAP (healthcare-associated pneumonia)   Sepsis due to pneumonia ([place])   Acute respiratory failure with hypoxia (HCC)   Hypokalemia   Pericardial effusion   Pressure ulcer   Lung nodules   Pleural effusion on right   Chest tube in place   Endotracheally intubated   Pleural effusion   Altered mental status   Acute pulmonary edema (HCC)   Sinusitis, acute maxillary   Septic shock (HCC)   Paroxysmal ventricular tachycardia ([place])    Results:   Labs: Results for orders placed or performed during the hospital encounter of 03/28/2015 (from the past 48 hour(s))  Glucose, capillary     Status: Abnormal   Collection Time: 04/29/15 11:52 AM  Result Value Ref Range   Glucose-Capillary 128 (H) 65 - 99 mg/dL  Glucose, capillary     Status: Abnormal   Collection Time: 04/29/15  3:07 PM  Result Value Ref Range   Glucose-Capillary 124 (H) 65 - 99 mg/dL  Glucose, capillary     Status: Abnormal   Collection Time: 04/29/15  8:01 PM  Result Value Ref Range   Glucose-Capillary 122 (H) 65 - 99 mg/dL   Comment 1 Notify RN    Comment 2 Document in Chart   Glucose, capillary     Status: Abnormal   Collection Time: 04/30/15 12:31 AM  Result Value Ref Range   Glucose-Capillary 135 (H) 65 - 99 mg/dL   Comment 1 Notify RN    Comment 2 Document in Chart   Glucose, capillary     Status: Abnormal   Collection Time: 04/30/15  3:57 AM  Result Value Ref Range   Glucose-Capillary 135 (H) 65 - 99 mg/dL   Comment 1 Arterial Specimen   Vancomycin, random     Status: None   Collection Time: 04/30/15  5:00 AM  Result Value Ref Range   Vancomycin Rm 27 ug/mL    Comment:        Random Vancomycin therapeutic range is dependent on dosage and time of specimen collection. A peak range is 20.0-40.0 ug/mL  A trough range is 5.0-15.0 ug/mL          Basic metabolic panel     Status: Abnormal   Collection Time: 04/30/15  5:00 AM  Result Value Ref Range   Sodium 138 135 - 145 mmol/L   Potassium 3.2 (L) 3.5 - 5.1 mmol/L   Chloride 108 101 - 111 mmol/L   CO2 21 (L) 22 - 32 mmol/L   Glucose, Bld 148 (H) 65 - 99 mg/dL   BUN 59 (H) 6 - 20 mg/dL   Creatinine, Ser 1.96 (H) 0.44 - 1.00 mg/dL   Calcium 7.3 (L) 8.9 - 10.3 mg/dL   GFR calc non Af Amer 23 (L) >60 mL/min   GFR calc Af Amer 27 (L) >60 mL/min    Comment: (NOTE) The eGFR has been calculated using the CKD EPI equation. This calculation has not been validated in all clinical situations. eGFR's persistently <60 mL/min signify possible Chronic Kidney Disease.    Anion gap 9 5 - 15  CBC with Differential/Platelet     Status: Abnormal   Collection Time: 04/30/15  5:00 AM  Result Value Ref Range   WBC 8.6 4.0 - 10.5 K/uL     Comment: WHITE COUNT CONFIRMED ON SMEAR WHITE COUNT CONFIRMED ON SMEAR    RBC 2.68 (L) 3.87 - 5.11 MIL/uL   Hemoglobin 7.8 (L) 12.0 - 15.0 g/dL    Comment: REPEATED TO VERIFY   HCT 23.9 (L) 36.0 - 46.0 %   MCV 89.2 78.0 - 100.0 fL   MCH 29.1 26.0 - 34.0 pg   MCHC 32.6 30.0 - 36.0 g/dL   RDW 17.1 (H) 11.5 - 15.5 %   Platelets 101 (L) 150 - 400 K/uL    Comment: PLATELET COUNT CONFIRMED BY SMEAR REPEATED TO VERIFY    Neutrophils Relative % 96 %   Lymphocytes Relative 2 %   Monocytes Relative 2 %   Eosinophils Relative 0 %   Basophils Relative 0 %   Neutro Abs 8.2 (H) 1.7 - 7.7 K/uL   Lymphs Abs 0.2 (L) 0.7 - 4.0 K/uL   Monocytes Absolute 0.2 0.1 - 1.0 K/uL   Eosinophils Absolute 0.0 0.0 - 0.7 K/uL   Basophils Absolute 0.0 0.0 - 0.1 K/uL   RBC Morphology RARE NRBCs     Comment: POLYCHROMASIA PRESENT TARGET CELLS PAPPENHEIMER BODIES   Magnesium     Status: None   Collection Time: 04/30/15  5:00 AM  Result Value Ref Range   Magnesium 2.2 1.7 - 2.4 mg/dL  Phosphorus     Status: None   Collection Time: 04/30/15  5:00 AM  Result Value Ref Range   Phosphorus 4.2 2.5 - 4.6 mg/dL  Protime-INR     Status: Abnormal   Collection Time: 04/30/15  5:00 AM  Result Value Ref Range   Prothrombin Time 16.2 (H) 11.6 - 15.2 seconds   INR 1.29 0.00 - 1.49  Lactic acid, plasma     Status: Abnormal   Collection Time: 04/30/15  5:00 AM  Result Value Ref Range   Lactic Acid, Venous 2.3 (HH) 0.5 - 2.0 mmol/L    Comment: CRITICAL RESULT CALLED TO, READ BACK BY AND VERIFIED WITH: PERRIN,J RN 04/30/2015 0601 JORDANS   Glucose, capillary     Status: Abnormal   Collection Time: 04/30/15  8:28 AM  Result Value Ref Range   Glucose-Capillary 140 (H) 65 - 99 mg/dL   Comment 1 Arterial Specimen    Comment 2 Notify RN  Glucose, capillary     Status: Abnormal   Collection Time: 04/30/15 12:29 PM  Result Value Ref Range   Glucose-Capillary 144 (H) 65 - 99 mg/dL  Glucose, capillary     Status:  Abnormal   Collection Time: 04/30/15  4:22 PM  Result Value Ref Range   Glucose-Capillary 142 (H) 65 - 99 mg/dL   Comment 1 Capillary Specimen    Comment 2 Notify RN   Glucose, capillary     Status: Abnormal   Collection Time: 05/01/15 12:04 AM  Result Value Ref Range   Glucose-Capillary 132 (H) 65 - 99 mg/dL   Comment 1 Arterial Specimen   Glucose, capillary     Status: Abnormal   Collection Time: 05/01/15  3:58 AM  Result Value Ref Range   Glucose-Capillary 117 (H) 65 - 99 mg/dL   Comment 1 Arterial Specimen   CBC with Differential/Platelet     Status: Abnormal   Collection Time: 05/01/15  4:00 AM  Result Value Ref Range   WBC 7.5 4.0 - 10.5 K/uL    Comment: WHITE COUNT CONFIRMED ON SMEAR   RBC 2.71 (L) 3.87 - 5.11 MIL/uL   Hemoglobin 7.9 (L) 12.0 - 15.0 g/dL   HCT 24.2 (L) 36.0 - 46.0 %   MCV 89.3 78.0 - 100.0 fL   MCH 29.2 26.0 - 34.0 pg   MCHC 32.6 30.0 - 36.0 g/dL   RDW 17.2 (H) 11.5 - 15.5 %   Platelets 110 (L) 150 - 400 K/uL    Comment: SPECIMEN CHECKED FOR CLOTS REPEATED TO VERIFY PLATELET COUNT CONFIRMED BY SMEAR    Neutrophils Relative % 91 %   Lymphocytes Relative 4 %   Monocytes Relative 5 %   Eosinophils Relative 0 %   Basophils Relative 0 %   Neutro Abs 6.8 1.7 - 7.7 K/uL   Lymphs Abs 0.3 (L) 0.7 - 4.0 K/uL   Monocytes Absolute 0.4 0.1 - 1.0 K/uL   Eosinophils Absolute 0.0 0.0 - 0.7 K/uL   Basophils Absolute 0.0 0.0 - 0.1 K/uL   RBC Morphology RARE NRBCs     Comment: TARGET CELLS  Basic metabolic panel     Status: Abnormal   Collection Time: 05/01/15  4:00 AM  Result Value Ref Range   Sodium 141 135 - 145 mmol/L   Potassium 2.8 (L) 3.5 - 5.1 mmol/L   Chloride 108 101 - 111 mmol/L   CO2 22 22 - 32 mmol/L   Glucose, Bld 130 (H) 65 - 99 mg/dL   BUN 64 (H) 6 - 20 mg/dL   Creatinine, Ser 1.99 (H) 0.44 - 1.00 mg/dL   Calcium 7.5 (L) 8.9 - 10.3 mg/dL   GFR calc non Af Amer 23 (L) >60 mL/min   GFR calc Af Amer 27 (L) >60 mL/min    Comment: (NOTE) The  eGFR has been calculated using the CKD EPI equation. This calculation has not been validated in all clinical situations. eGFR's persistently <60 mL/min signify possible Chronic Kidney Disease.    Anion gap 11 5 - 15  Magnesium     Status: None   Collection Time: 05/01/15  4:00 AM  Result Value Ref Range   Magnesium 2.0 1.7 - 2.4 mg/dL  Phosphorus     Status: None   Collection Time: 05/01/15  4:00 AM  Result Value Ref Range   Phosphorus 3.8 2.5 - 4.6 mg/dL  Vancomycin, random     Status: None   Collection Time: 05/01/15  4:00 AM  Result Value Ref Range   Vancomycin Rm 22 ug/mL    Comment:        Random Vancomycin therapeutic range is dependent on dosage and time of specimen collection. A peak range is 20.0-40.0 ug/mL A trough range is 5.0-15.0 ug/mL            Imaging / Studies: Dg Chest Port 1 View  05/01/2015  CLINICAL DATA:  Respiratory failure. EXAM: PORTABLE CHEST 1 VIEW COMPARISON:  04/29/2015. FINDINGS: Endotracheal tube, NG tube, left chest tube in stable position. Heart size stable. Persistent right lower lobe infiltrate and right pleural effusion. No pneumothorax. Surgical clips left axilla. IMPRESSION: 1. Lines and tubes in stable position.  No pneumothorax. 2. Persistent right lower lobe infiltrate with right pleural effusion. No interim change. Electronically Signed   By: Marcello Moores  Register   On: 05/01/2015 07:25    Medications / Allergies:  Scheduled Meds: . antiseptic oral rinse  7 mL Mouth Rinse QID  . chlorhexidine gluconate  15 mL Mouth Rinse BID  . folic acid  2 mg Per Tube Daily  . Gerhardt's butt cream   Topical Daily  . hydrocortisone sodium succinate  50 mg Intravenous Q6H  . insulin aspart  0-9 Units Subcutaneous 6 times per day  . pantoprazole sodium  40 mg Per Tube Daily  . piperacillin-tazobactam (ZOSYN)  IV  3.375 g Intravenous Q8H  . potassium chloride  10 mEq Intravenous Q1 Hr x 4  . raloxifene  60 mg Oral q morning - 10a  . sodium chloride   1,000 mL Intravenous Once  . sodium chloride  10-40 mL Intracatheter Q12H   Continuous Infusions: . sodium chloride 10 mL/hr at 05/01/15 0400  . feeding supplement (VITAL AF 1.2 CAL) 1,000 mL (05/01/15 0400)   PRN Meds:.fentaNYL (SUBLIMAZE) injection, levalbuterol, midazolam, midazolam, sodium chloride  Antibiotics: Anti-infectives    Start     Dose/Rate Route Frequency Ordered Stop   04/27/15 1100  piperacillin-tazobactam (ZOSYN) IVPB 3.375 g     3.375 g 12.5 mL/hr over 240 Minutes Intravenous Every 8 hours 04/27/15 1048     04/27/15 1000  vancomycin (VANCOCIN) IVPB 750 mg/150 ml premix  Status:  Discontinued     750 mg 150 mL/hr over 60 Minutes Intravenous Every 12 hours 04/27/15 0947 04/28/15 1111   04/21/15 1200  imipenem-cilastatin (PRIMAXIN) 500 mg in sodium chloride 0.9 % 100 mL IVPB  Status:  Discontinued     500 mg 200 mL/hr over 30 Minutes Intravenous Every 8 hours 04/21/15 1143 04/26/15 1010   04/20/15 1000  vancomycin (VANCOCIN) IVPB 1000 mg/200 mL premix  Status:  Discontinued     1,000 mg 200 mL/hr over 60 Minutes Intravenous Every 24 hours 04/19/15 2228 04/26/15 1010   04/19/15 1800  cefTAZidime (FORTAZ) 2 g in dextrose 5 % 50 mL IVPB  Status:  Discontinued     2 g 100 mL/hr over 30 Minutes Intravenous Every 12 hours 04/19/15 1350 04/21/15 1119   04/17/15 2200  vancomycin (VANCOCIN) 500 mg in sodium chloride 0.9 % 100 mL IVPB  Status:  Discontinued     500 mg 100 mL/hr over 60 Minutes Intravenous Every 8 hours 04/17/15 1510 04/17/15 1516   04/17/15 1530  vancomycin (VANCOCIN) 500 mg in sodium chloride 0.9 % 100 mL IVPB  Status:  Discontinued     500 mg 100 mL/hr over 60 Minutes Intravenous Every 8 hours 04/17/15 1516 04/19/15 1346   04/11/2015 1130  cefUROXime (ZINACEF) 1.5 g in dextrose  5 % 50 mL IVPB     1.5 g 100 mL/hr over 30 Minutes Intravenous To Surgery 04/15/2015 1101 03/25/2015 1418   04/15/15 0200  vancomycin (VANCOCIN) 500 mg in sodium chloride 0.9 % 100 mL  IVPB  Status:  Discontinued     500 mg 100 mL/hr over 60 Minutes Intravenous Every 12 hours 04/15/2015 1335 03/25/2015 1743   04/15/15 0200  vancomycin (VANCOCIN) 500 mg in sodium chloride 0.9 % 100 mL IVPB  Status:  Discontinued     500 mg 100 mL/hr over 60 Minutes Intravenous Every 12 hours 03/22/2015 1930 04/17/15 1510   03/26/2015 2200  cefTAZidime (FORTAZ) 2 g in dextrose 5 % 50 mL IVPB  Status:  Discontinued     2 g 100 mL/hr over 30 Minutes Intravenous 3 times per day 03/24/2015 1931 04/19/15 1350   04/08/2015 1645  cefTAZidime (FORTAZ) 2 g in dextrose 5 % 50 mL IVPB  Status:  Discontinued     2 g 100 mL/hr over 30 Minutes Intravenous 3 times per day 04/10/2015 1641 04/10/2015 1644   04/18/2015 1330  cefTAZidime (FORTAZ) 2 g in dextrose 5 % 50 mL IVPB  Status:  Discontinued     2 g 100 mL/hr over 30 Minutes Intravenous 3 times per day 03/24/2015 1309 04/02/2015 1743   04/15/2015 1315  vancomycin (VANCOCIN) IVPB 1000 mg/200 mL premix     1,000 mg 200 mL/hr over 60 Minutes Intravenous  Once 03/30/2015 1309 03/23/2015 1512        Assessment/Plan POD#1 skin biopsy -check with pathology, specimens have been sent out -change dressing as needed, likely going to drain given anasarca. -please call surgery for further assistance   Erby Pian, [facility] Pager [phone](7A-4:30P) For consults and floor pages call [phone](7A-4:30P)  05/01/2015 8:14 AM

## 2015-05-01 NOTE — Consult Note (Signed)
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 She was then readmitted on 04/08/2015 with HCAP and found to have cardiac tamponade s/p pericardial window on 04/03/2015. 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 Off of pressors since 04/30/15. She has been oliguric for the last 3 days with increasing SCr (1.98).     Met with the patient, her son, and her daughter. I first met with her children alone as [name] [name] nodded yes when I asked if she would like me to speak with her children first.  Her children report the most important things to her are church and her family. She was living independently up until recurrent hospitalizations that began this summer. They report that prior to this, she had been doing well but was not eating as well as she had in the past due to being depressed due to a financial situation involving her house.  They report she still had cognition and functional status at her baseline.  Her children report the doctors have been doing a good job explaining things to them, however they understand there is still some  question about the underlying etiology of her symptoms. They're very interested to hear that there is any other workup to be done or possible treatments that may help with vasculitis that was found on biopsy.  Overall, they have a very good understanding of her poor prognosis. We discussed her hospital course to this point in time as well as the fact that she remains critically ill. We also talked about the fact that her care moving forward should focus on a pathway towards stated goal of being out of the hospital and spending time with family.  Her family reports that their mother appears to be mentally able to process what has been going on with her care.  They report being "okay with whenever she decides."  Following conversation with her children, I sat and spoke with [name] [name] at length with her family in the room. The interaction is limited secondary to the fact that she remains intubated. She does appear awake and alert and seems to answer all questions appropriately.  We talked about her clinical course to this point in time and she understands that she is very ill. We also discussed the fact that she has not been able to be weaned from the ventilator to this point. I asked if it would be helpful to discuss options moving forward and she indicated that it would. We talked about what possible paths forward may look like, including successful weaning from ventilator, withdrawal of ventilator support with focus on comfort, and possibility of trach and PEG with prolonged recovery at [facility]. I expressed my concern that she would never be able to be successfully weaned from the ventilator during her  current hospitalization based upon her need for recurrent intubation.  We also discussed that in light of multiple medical problems, care should be focused on interventions that are likely to allow her to achieve goals of getting out of the hospital and spending time with family. I discussed with her regarding  heroic interventions at the end-of-life and that this would not be likely to lead to getting well enough to leave the hospital or ever transition back home. She seemed to understand this conversation. When asked specifically about CODE STATUS afterward, she nodded that she would want heroic measures performed the end-of-life despite understanding the above.  She remains a full code.   - I met with [name] [name] and her family. We began discussion regarding long-term goals of care. Overall, the discussion is complicated further by the fact that the patient remains intubated and therefore has limited communication skills. She did indicate that she would want to have aggressive measures performed at the end-of-life despite understanding there would be little chance that she would survive and recover enough to ever leave the hospital and return to eventual goal of returning home. Her children tell me that they are not sure if their presence in the room would influence her decision making. I will bring this up again tomorrow when I meet with [name] [name] when her children are not present.  She remains a full code for now. - We also began discussion regarding long-term care plan moving forward. She began to withdraw from conversation and when asked she indicated that she was becoming overwhelmed she indicated yes. - One of my primary goals with today's meeting with family was to begin to establish rapport. I'll plan to follow-up with [name] [name] tomorrow and continue conversation as she is able. Her children are deferring to her for all decisions at this point as they feel that she is understanding what is going on and able to make her own decisions. It is my understanding that her mental status waxed and waned throughout the day. She did appear to be very clear in understanding our conversation during my encounter.  Contacts/Participants in Discussion: Primary Decision Maker: The patient, her son, and her daughter HCPOA:  no, none on chart  Code Status/Advance Care Planning:  Full code  Symptom Management:   Denies symptoms currently  Pain: Continue fentanyl as ordered  Agitation: Continue Versed as ordered  Psycho-social/Spiritual:   Support System: Strong through family and church  Desire for further Chaplaincy support:yes  Prognosis: Unable to determine due to acute illness  Discharge Planning:  To be determined       Chief Complaint/History of Present Illness:  80 year old female currently intubated  Primary Diagnoses  Present on Admission:  . HCAP (healthcare-associated pneumonia) . (Resolved) Sepsis ([place]) . (Resolved) CKD (chronic kidney disease) stage 4, GFR 15-29 ml/min (HCC) . Adrenal nodule ([place]) . Chronic diastolic heart failure ([place]) . HLD (hyperlipidemia) . Malnutrition of moderate degree ([place]) . Sepsis due to pneumonia ([place]) . Acute respiratory failure with hypoxia ([place]) . Hypokalemia . Lung nodule . MGUS (monoclonal gammopathy of unknown significance) . Pericardial effusion . Pressure ulcer . Pericardial tamponade  Palliative Review of Systems: Difficult review secondary to patient being intubated. She denies pain shortness of breath nausea vomiting I have reviewed the medical record, interviewed the patient and family, and examined the patient. The following aspects are pertinent.  Past Medical History  Diagnosis Date  . Abnormal WBC count     low, h/o  . Other  and unspecified hyperlipidemia   . Unspecified essential hypertension   . OP (osteoporosis)   . Vitamin D deficiency   . MGUS (monoclonal gammopathy of unknown significance) 03/16/2013  . Back pain   . Breast cancer ([place]) 1981    mastectomy and lymph node removal  . DM type 2 (diabetes mellitus, type 2) ([place])     Pt states she is 'prediabetic'   Social History   Social History  . Marital Status: Widowed    Spouse Name: N/A  . Number of Children: 2  . Years of Education: N/A   Social  History Main Topics  . Smoking status: Former Smoker -- 1.00 packs/day for 10 years    Types: Cigarettes    Start date: 05/15/1956    Quit date: 07/05/1966  . Smokeless tobacco: Never Used     Comment: quit 45 years ago  . Alcohol Use: No  . Drug Use: No  . Sexual Activity: No   Other Topics Concern  . None   Social History Narrative   Family History  Problem Relation Age of Onset  . Liver cancer Father   . Diabetes Father    Scheduled Meds: . antiseptic oral rinse  7 mL Mouth Rinse QID  . chlorhexidine gluconate  15 mL Mouth Rinse BID  . folic acid  2 mg Per Tube Daily  . Gerhardt's butt cream   Topical Daily  . hydrocortisone sodium succinate  50 mg Intravenous Q6H  . insulin aspart  0-9 Units Subcutaneous 6 times per day  . pantoprazole sodium  40 mg Per Tube Daily  . piperacillin-tazobactam (ZOSYN)  IV  3.375 g Intravenous Q8H  . potassium chloride  10 mEq Intravenous Q1 Hr x 2  . raloxifene  60 mg Oral q morning - 10a  . sodium chloride  1,000 mL Intravenous Once  . sodium chloride  10-40 mL Intracatheter Q12H   Continuous Infusions: . sodium chloride 10 mL/hr at 05/01/15 0400  . feeding supplement (VITAL AF 1.2 CAL) 1,000 mL (05/01/15 1243)   PRN Meds:.fentaNYL (SUBLIMAZE) injection, levalbuterol, midazolam, midazolam, sodium chloride Medications Prior to Admission:  Prior to Admission medications   Medication Sig Start Date End Date Taking? Authorizing Provider  acetaminophen (TYLENOL) 325 MG tablet Take 2 tablets (650 mg total) by mouth every 6 (six) hours as needed for mild pain, moderate pain, fever or headache (or Fever >/= 101). 04/12/15  Yes Modena Jansky, MD  escitalopram (LEXAPRO) 10 MG tablet Take 5 mg by mouth every morning.    Yes Historical Provider, MD  feeding supplement, ENSURE ENLIVE, (ENSURE ENLIVE) LIQD Take 237 mLs by mouth 2 (two) times daily between meals. 03/21/15  Yes Belkys A Regalado, MD  fish oil-omega-3 fatty acids 1000 MG capsule Take 2  g by mouth every morning.    Yes Historical Provider, MD  folic acid (FOLVITE) 1 MG tablet Take 2 tablets (2 mg total) by mouth daily. 04/12/15  Yes Modena Jansky, MD  Multiple Vitamin (MULTIVITAMIN) capsule Take 1 capsule by mouth every morning.    Yes Historical Provider, MD  ondansetron (ZOFRAN-ODT) 4 MG disintegrating tablet Take 1 tablet (4 mg total) by mouth every 8 (eight) hours as needed for nausea or vomiting. 04/12/15  Yes Modena Jansky, MD  pantoprazole (PROTONIX) 40 MG tablet Take 1 tablet (40 mg total) by mouth at bedtime. 04/10/15  Yes Reyne Dumas, MD  polyethylene glycol (MIRALAX / GLYCOLAX) packet Take 17 g by mouth daily. 04/10/15  Yes Reyne Dumas, MD  raloxifene (EVISTA) 60 MG tablet Take 60 mg by mouth every morning.    Yes Historical Provider, MD  rosuvastatin (CRESTOR) 20 MG tablet Take 10 mg by mouth every morning.    Yes Historical Provider, MD  simethicone (MYLICON) 80 MG chewable tablet Chew 0.5 tablets (40 mg total) by mouth every 6 (six) hours as needed for flatulence (bloating). 04/10/15  Yes Reyne Dumas, MD  Vitamin D, Ergocalciferol, (DRISDOL) 50000 UNITS CAPS Take 50,000 Units by mouth every Monday.    Yes Historical Provider, MD   No Known Allergies CBC:    Component Value Date/Time   WBC 7.5 05/01/2015 0400   WBC 2.1* 10/08/2014 1321   WBC 2.3* 12/19/2007 0820   HGB 7.9* 05/01/2015 0400   HGB 12.2 10/08/2014 1321   HGB 14.0 12/19/2007 0820   HCT 24.2* 05/01/2015 0400   HCT 35.9 10/08/2014 1321   HCT 41.0 12/19/2007 0820   PLT 110* 05/01/2015 0400   PLT 86* 10/08/2014 1321   PLT 110* 12/19/2007 0820   MCV 89.3 05/01/2015 0400   MCV 97 10/08/2014 1321   MCV 94.9 12/19/2007 0820   NEUTROABS 6.8 05/01/2015 0400   NEUTROABS 1.6 10/08/2014 1321   NEUTROABS 1.7 12/19/2007 0820   LYMPHSABS 0.3* 05/01/2015 0400   LYMPHSABS 0.2* 10/08/2014 1321   LYMPHSABS 0.3* 12/19/2007 0820   MONOABS 0.4 05/01/2015 0400   MONOABS 0.2 12/19/2007 0820   EOSABS 0.0  05/01/2015 0400   EOSABS 0.1 10/08/2014 1321   EOSABS 0.1 12/19/2007 0820   BASOSABS 0.0 05/01/2015 0400   BASOSABS 0.0 10/08/2014 1321   BASOSABS 0.0 12/19/2007 0820   Comprehensive Metabolic Panel:    Component Value Date/Time   NA 140 05/01/2015 1345   NA 137 10/08/2014 1321   K 2.9* 05/01/2015 1345   K 3.3 10/08/2014 1321   CL 109 05/01/2015 1345   CL 98 10/08/2014 1321   CO2 22 05/01/2015 1345   CO2 31 10/08/2014 1321   BUN 65* 05/01/2015 1345   BUN 5* 10/08/2014 1321   CREATININE 1.98* 05/01/2015 1345   CREATININE 1.0 10/08/2014 1321   GLUCOSE 150* 05/01/2015 1345   GLUCOSE 98 10/08/2014 1321   CALCIUM 7.4* 05/01/2015 1345   CALCIUM 9.4 10/08/2014 1321   AST 98* 04/18/2015 0550   AST 29 10/08/2014 1321   ALT 31 04/18/2015 0550   ALT 15 10/08/2014 1321   ALKPHOS 119 04/18/2015 0550   ALKPHOS 71 10/08/2014 1321   BILITOT 0.3 04/18/2015 0550   BILITOT 0.80 10/08/2014 1321   PROT 3.7* 04/18/2015 0550   PROT 6.9 10/08/2014 1321   ALBUMIN 1.4* 04/25/2015 0534   ALBUMIN 3.8 10/08/2014 1321    Physical Exam: Vital Signs: BP 107/62 mmHg  Pulse 93  Temp(Src) 98.3 F (36.8 C) (Oral)  Resp 24  Ht 5' 8.5" (1.74 m)  Wt 81.3 kg (179 lb 3.7 oz)  BMI 26.85 kg/m2  SpO2 100% SpO2: SpO2: 100 % O2 Device: O2 Device: Ventilator O2 Flow Rate: O2 Flow Rate (L/min): 3 L/min Intake/output summary:  Intake/Output Summary (Last 24 hours) at 05/01/15 1918 Last data filed at 05/01/15 1837  Gross per 24 hour  Intake   2515 ml  Output   1495 ml  Net   1020 ml   LBM: Last BM Date: 05/01/15 Baseline Weight: Weight: 66.6 kg (146 lb 13.2 oz) Most recent weight: Weight: 81.3 kg (179 lb 3.7 oz)  Exam Findings:  General appearance: cachectic and intubated Head:  Normocephalic, without obvious abnormality, atraumatic Eyes:  conjunctivae and sclerae normal  Resp: rhonchi bilaterally Cardio: Regular, no murmur GI: soft, non-tender; bowel sounds normal; no masses, no  organomegaly Extremities: covered in diffuse erythemetous rash with yellowish bullae involving the lower extremities and toes as well as chest and arms         Palliative Performance Scale: 10               Additional Data Reviewed: Recent Labs     04/30/15  0500  05/01/15  0400  05/01/15  1345  WBC  8.6  7.5   --   HGB  7.8*  7.9*   --   PLT  101*  110*   --   NA  138  141  140  BUN  59*  64*  65*  CREATININE  1.96*  1.99*  1.98*     Time In: 1750 Time Out: 1915 Time Total: 85 Greater than 50%  of this time was spent counseling and coordinating care related to the above assessment and plan.  Signed by: Micheline Rough, MD  Micheline Rough, MD  05/01/2015, 7:18 PM  Please contact Palliative Medicine Team phone at [phone] for questions and concerns.

## 2015-05-01 NOTE — Consult Note (Signed)
Reason for Consult:AKI Referring Physician: Chase Caller, MD  [name] [name] is an 80 y.o. female.  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  She was then readmitted on 03/23/2015 with HCAP and found to have cardiac tamponade s/p pericardial window on 04/19/2015.  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  We have been asked to help evaluate her AKI and possibility of vasculitis as the cause of her rash and AKI.  She is now off of pressors since 04/30/15 and had a CVP of 3.  She did receive some IV lasix on 04/30/15 but received a bolus of IVF's today.  She has been oliguric for the last 3 days with increasing Scr seen below.   Trend in Creatinine:  CREATININE, SER  Date/Time Value Ref Range Status  05/01/2015 04:00 AM 1.99* 0.44 - 1.00 mg/dL Final  04/30/2015 05:00 AM 1.96* 0.44 - 1.00 mg/dL Final  04/29/2015 04:58 AM 1.79* 0.44 - 1.00 mg/dL Final  04/28/2015 04:35 AM 1.53* 0.44 - 1.00 mg/dL Final  04/27/2015 03:30 AM 0.88 0.44 - 1.00 mg/dL Final  04/26/2015 05:20 AM 0.65 0.44 - 1.00 mg/dL Final  04/25/2015 05:34 AM 0.86 0.44 - 1.00 mg/dL Final  04/24/2015 04:55 AM 1.01* 0.44 - 1.00 mg/dL Final  04/23/2015 12:36 PM 1.06* 0.44 - 1.00 mg/dL Final  04/23/2015 04:24 AM 1.01* 0.44 - 1.00 mg/dL Final  04/22/2015 01:00 PM 0.93 0.44 - 1.00 mg/dL Final  04/22/2015 04:50 AM 0.97 0.44 - 1.00 mg/dL Final  04/21/2015 09:16 PM 1.00 0.44 - 1.00 mg/dL Final  04/21/2015 12:45 PM 0.88 0.44 -  1.00 mg/dL Final  04/21/2015 01:30 AM 0.82 0.44 - 1.00 mg/dL Final  04/20/2015 06:04 AM 0.97 0.44 - 1.00 mg/dL Final  04/19/2015 05:00 AM 1.07* 0.44 - 1.00 mg/dL Final  04/18/2015 05:50 AM 0.78 0.44 - 1.00 mg/dL Final  04/17/2015 05:10 AM 0.89 0.44 - 1.00 mg/dL Final  04/07/2015 10:22 AM 0.77 0.44 - 1.00 mg/dL Final  04/02/2015 12:32 AM 0.90 0.44 - 1.00 mg/dL Final  04/15/2015 05:15 AM 0.67 0.44 - 1.00 mg/dL Final  04/17/2015 09:15 PM 0.70 0.44 - 1.00 mg/dL Final  03/26/2015 01:40 PM 0.89 0.44 - 1.00 mg/dL Final  04/11/2015 05:06 AM 0.73 0.44 - 1.00 mg/dL Final  04/10/2015 08:31 AM 0.72 0.44 - 1.00 mg/dL Final  04/09/2015 05:09 AM 0.72 0.44 - 1.00 mg/dL Final  04/07/2015 08:35 AM 0.85 0.44 - 1.00 mg/dL Final  04/06/2015 04:30 AM 0.90 0.44 - 1.00 mg/dL Final  04/05/2015 02:35 PM 1.08* 0.44 - 1.00 mg/dL Final  04/04/2015 05:00 AM 1.14* 0.44 - 1.00 mg/dL Final  04/02/2015 04:58 AM 0.86 0.44 - 1.00 mg/dL Final  04/01/2015 09:14 AM 0.92 0.44 - 1.00 mg/dL Final  04/01/2015 04:29 AM 0.84 0.44 - 1.00 mg/dL Final  03/31/2015 03:46 AM 1.07* 0.44 - 1.00 mg/dL Final  03/30/2015 08:35 PM 1.10* 0.44 - 1.00 mg/dL Final  03/30/2015 08:25 PM 0.93 0.44 - 1.00 mg/dL Final  03/21/2015 05:49 AM 1.16* 0.44 - 1.00 mg/dL Final  03/20/2015 05:30 AM 1.09* 0.44 - 1.00 mg/dL Final  03/19/2015  05:08 AM 0.89 0.44 - 1.00 mg/dL Final  03/18/2015 04:35 AM 0.94 0.44 - 1.00 mg/dL Final  03/16/2015 05:05 AM 1.02* 0.44 - 1.00 mg/dL Final  03/15/2015 12:00 PM 1.24* 0.44 - 1.00 mg/dL Final  03/15/2015 06:05 AM 1.33* 0.44 - 1.00 mg/dL Final  03/15/2015 04:35 AM 1.41* 0.44 - 1.00 mg/dL Final  03/14/2015 07:05 PM 1.81* 0.44 - 1.00 mg/dL Final  10/08/2014 01:21 PM 1.0 0.6 - 1.2 mg/dl Final  07/17/2013 11:54 AM 0.98 0.50 - 1.10 mg/dL Final  09/12/2010 06:34 PM .9 0.4 - 1.2 mg/dL Final    PMH:   Past Medical History  Diagnosis Date  . Abnormal WBC count     low, h/o  . Other and unspecified hyperlipidemia   .  Unspecified essential hypertension   . OP (osteoporosis)   . Vitamin D deficiency   . MGUS (monoclonal gammopathy of unknown significance) 03/16/2013  . Back pain   . Breast cancer ([place]) 1981    mastectomy and lymph node removal  . DM type 2 (diabetes mellitus, type 2) ([place])     Pt states she is 'prediabetic'    [place]:   Past Surgical History  Procedure Laterality Date  . Mastectomy      left  . Breast biopsy      right  . Breast reconstruction    . Skin cancer removal      basal cell skin cancer  . Wisdom tooth extraction    . Tooth extraction    . Laparoscopic splenectomy N/A 04/05/2015    Procedure: LAPAROSCOPIC SPLENECTOMY;  Surgeon: Johnathan Hausen, MD;  Location: [facility];  Service: General;  Laterality: N/A;  . Subxyphoid pericardial window N/A 04/12/2015    Procedure: SUBXYPHOID PERICARDIAL WINDOW;  Surgeon: Ivin Poot, MD;  Location: [facility];  Service: Thoracic;  Laterality: N/A;  . Chest tube insertion Left 04/01/2015    Procedure: CHEST TUBE INSERTION;  Surgeon: Ivin Poot, MD;  Location: [facility];  Service: Thoracic;  Laterality: Left;  . Tee without cardioversion N/A 03/27/2015    Procedure: TRANSESOPHAGEAL ECHOCARDIOGRAM (TEE);  Surgeon: Ivin Poot, MD;  [facility]: [facility];  Service: Thoracic;  Laterality: N/A;    Allergies: No Known Allergies  Medications:   Prior to Admission medications   Medication Sig Start Date End Date Taking? Authorizing Provider  acetaminophen (TYLENOL) 325 MG tablet Take 2 tablets (650 mg total) by mouth every 6 (six) hours as needed for mild pain, moderate pain, fever or headache (or Fever >/= 101). 04/12/15  Yes Modena Jansky, MD  escitalopram (LEXAPRO) 10 MG tablet Take 5 mg by mouth every morning.    Yes Historical Provider, MD  feeding supplement, ENSURE ENLIVE, (ENSURE ENLIVE) LIQD Take 237 mLs by mouth 2 (two) times daily between meals. 03/21/15  Yes Belkys A Regalado, MD  fish oil-omega-3 fatty acids 1000 MG capsule Take 2 g by  mouth every morning.    Yes Historical Provider, MD  folic acid (FOLVITE) 1 MG tablet Take 2 tablets (2 mg total) by mouth daily. 04/12/15  Yes Modena Jansky, MD  Multiple Vitamin (MULTIVITAMIN) capsule Take 1 capsule by mouth every morning.    Yes Historical Provider, MD  ondansetron (ZOFRAN-ODT) 4 MG disintegrating tablet Take 1 tablet (4 mg total) by mouth every 8 (eight) hours as needed for nausea or vomiting. 04/12/15  Yes Modena Jansky, MD  pantoprazole (PROTONIX) 40 MG tablet Take 1 tablet (40 mg total) by mouth at bedtime. 04/10/15  Yes Reyne Dumas, MD  polyethylene glycol (MIRALAX / GLYCOLAX) packet Take 17 g by mouth daily. 04/10/15  Yes Reyne Dumas, MD  raloxifene (EVISTA) 60 MG tablet Take 60 mg by mouth every morning.    Yes Historical Provider, MD  rosuvastatin (CRESTOR) 20 MG tablet Take 10 mg by mouth every morning.    Yes Historical Provider, MD  simethicone (MYLICON) 80 MG chewable tablet Chew 0.5 tablets (40 mg total) by mouth every 6 (six) hours as needed for flatulence (bloating). 04/10/15  Yes Reyne Dumas, MD  Vitamin D, Ergocalciferol, (DRISDOL) 50000 UNITS CAPS Take 50,000 Units by mouth every Monday.    Yes Historical Provider, MD    Inpatient medications: . antiseptic oral rinse  7 mL Mouth Rinse QID  . chlorhexidine gluconate  15 mL Mouth Rinse BID  . folic acid  2 mg Per Tube Daily  . Gerhardt's butt cream   Topical Daily  . hydrocortisone sodium succinate  50 mg Intravenous Q6H  . insulin aspart  0-9 Units Subcutaneous 6 times per day  . pantoprazole sodium  40 mg Per Tube Daily  . piperacillin-tazobactam (ZOSYN)  IV  3.375 g Intravenous Q8H  . raloxifene  60 mg Oral q morning - 10a  . sodium chloride  1,000 mL Intravenous Once  . sodium chloride  10-40 mL Intracatheter Q12H    Discontinued Meds:   Medications Discontinued During This Encounter  Medication Reason  . cefTAZidime (FORTAZ) 2 g in dextrose 5 % 50 mL IVPB Duplicate  . 0.9 %  sodium chloride  infusion   . cefTAZidime (FORTAZ) 2 g in dextrose 5 % 50 mL IVPB   . potassium chloride SA (K-DUR,KLOR-CON) CR tablet 40 mEq   . vancomycin (VANCOCIN) 500 mg in sodium chloride 0.9 % 100 mL IVPB   . multivitamin capsule 1 capsule Formulary change  . fish oil-omega-3 fatty acids capsule 2 g Formulary change  . ipratropium (ATROVENT) nebulizer solution 0.5 mg   . levalbuterol (XOPENEX) nebulizer solution 0.63 mg   . ipratropium (ATROVENT) nebulizer solution 0.5 mg   . ipratropium (ATROVENT) nebulizer solution 0.5 mg   . levalbuterol (XOPENEX) nebulizer solution 0.63 mg   . levalbuterol (XOPENEX) nebulizer solution 0.63 mg   . sterile water for irrigation for irrigation Patient Discharge  . 0.9 % irrigation (POUR BTL) Patient Discharge  . 0.9 %  sodium chloride infusion   . acetaminophen (TYLENOL) tablet 650 mg   . feeding supplement (ENSURE ENLIVE) (ENSURE ENLIVE) liquid 237 mL   . ondansetron (ZOFRAN) tablet 4 mg   . ondansetron (ZOFRAN) injection 4 mg   . sodium chloride 0.9 % injection 3 mL   . omega-3 acid ethyl esters (LOVAZA) capsule 1 g   . promethazine (PHENERGAN) injection 6.25-12.5 mg   . HYDROmorphone (DILAUDID) injection 0.25-0.5 mg   . chlorhexidine gluconate (PERIDEX) 0.12 % solution 15 mL   . pantoprazole (PROTONIX) EC tablet 40 mg   . multivitamin with minerals tablet 1 tablet   . senna-docusate (Senokot-S) tablet 1 tablet   . antiseptic oral rinse (CPC / CETYLPYRIDINIUM CHLORIDE 0.05%) solution 7 mL Duplicate  . antiseptic oral rinse solution (CORINZ) Duplicate  . potassium chloride 20 MEQ/15ML (10%) solution 40 mEq   . ipratropium-albuterol (DUONEB) 0.5-2.5 (3) MG/3ML nebulizer solution 3 mL   . vancomycin (VANCOCIN) 500 mg in sodium chloride 0.9 % 100 mL IVPB   . vancomycin (VANCOCIN) 500 mg in sodium chloride 0.9 % 100 mL IVPB   . antiseptic oral  rinse solution (CORINZ)   . chlorhexidine gluconate (PERIDEX) 0.12 % solution 15 mL   . pantoprazole sodium (PROTONIX)  40 mg/20 mL oral suspension 40 mg   . sennosides (SENOKOT) 8.8 MG/5ML syrup 5 mL   . dextrose 5 % and 0.9 % NaCl with KCl 20 mEq/L infusion   . dexmedetomidine (PRECEDEX) 200 MCG/50ML (4 mcg/mL) infusion   . methylPREDNISolone sodium succinate (SOLU-MEDROL) 125 mg/2 mL injection 80 mg   . insulin aspart (novoLOG) injection 0-24 Units   . norepinephrine (LEVOPHED) 4 mg in dextrose 5 % 250 mL (0.016 mg/mL) infusion   . vancomycin (VANCOCIN) 500 mg in sodium chloride 0.9 % 100 mL IVPB   . cefTAZidime (FORTAZ) 2 g in dextrose 5 % 50 mL IVPB   . folic acid (FOLVITE) tablet 2 mg   . escitalopram (LEXAPRO) tablet 5 mg   . rosuvastatin (CRESTOR) tablet 10 mg   . guaiFENesin-dextromethorphan (ROBITUSSIN DM) 100-10 MG/5ML syrup 5 mL   . omega-3 acid ethyl esters (LOVAZA) capsule 1 g   . potassium chloride 10 mEq in 50 mL *CENTRAL LINE* IVPB   . bisacodyl (DULCOLAX) EC tablet 10 mg   . traMADol (ULTRAM) tablet 50 mg   . multivitamin liquid 5 mL   . pantoprazole (PROTONIX) EC tablet 40 mg   . senna (SENOKOT) tablet 8.6 mg   . feeding supplement (ENSURE ENLIVE) (ENSURE ENLIVE) liquid 237 mL   . cefTAZidime (FORTAZ) 2 g in dextrose 5 % 50 mL IVPB   . benzonatate (TESSALON) capsule 200 mg   . furosemide (LASIX) injection 20 mg   . acetaminophen (TYLENOL) tablet 1,000 mg   . acetaminophen (TYLENOL) solution 1,000 mg   . insulin aspart (novoLOG) injection 0-15 Units   . insulin aspart (novoLOG) injection 0-5 Units   . fentaNYL (SUBLIMAZE) injection 25-50 mcg   . antiseptic oral rinse (CPC / CETYLPYRIDINIUM CHLORIDE 0.05%) solution 7 mL   . potassium chloride 10 mEq in 50 mL *CENTRAL LINE* IVPB   . simethicone (MYLICON) chewable tablet 40 mg   . norepinephrine (LEVOPHED) 4 mg in dextrose 5 % 250 mL (0.016 mg/mL) infusion   . norepinephrine (LEVOPHED) 4 mg in dextrose 5 % 250 mL (0.016 mg/mL) infusion   . potassium chloride 10 mEq in 50 mL *CENTRAL LINE* IVPB Duplicate  . ondansetron (ZOFRAN)  injection 4 mg   . fentaNYL (SUBLIMAZE) injection 50 mcg   . 0.9 %  sodium chloride infusion   . feeding supplement (VITAL HIGH PROTEIN) liquid 1,000 mL   . feeding supplement (VITAL AF 1.2 CAL) liquid 1,000 mL   . antiseptic oral rinse solution (CORINZ)   . hydrocortisone sodium succinate (SOLU-CORTEF) 100 MG injection 100 mg   . norepinephrine (LEVOPHED) 16 mg in dextrose 5 % 250 mL (0.064 mg/mL) infusion   . vasopressin (PITRESSIN) 40 Units in sodium chloride 0.9 % 250 mL (0.16 Units/mL) infusion   . 0.9 %  sodium chloride infusion   . vancomycin (VANCOCIN) IVPB 1000 mg/200 mL premix   . imipenem-cilastatin (PRIMAXIN) 500 mg in sodium chloride 0.9 % 100 mL IVPB   . midazolam (VERSED) injection 1 mg   . midazolam (VERSED) injection 1 mg   . hydrocortisone sodium succinate (SOLU-CORTEF) 100 MG injection 50 mg   . chlorhexidine gluconate (PERIDEX) 0.12 % solution 15 mL   . fentaNYL (SUBLIMAZE) injection 50 mcg   . antiseptic oral rinse (CPC / CETYLPYRIDINIUM CHLORIDE 0.05%) solution 7 mL Duplicate  . acetylcysteine (MUCOMYST) 20 %  nebulizer / oral solution 2 mL   . feeding supplement (VITAL HIGH PROTEIN) liquid 1,000 mL   . norepinephrine (LEVOPHED) 4 mg in dextrose 5 % 250 mL (0.016 mg/mL) infusion   . vancomycin (VANCOCIN) IVPB 750 mg/150 ml premix   . pantoprazole (PROTONIX) injection 40 mg   . folic acid injection 2 mg   . acetaminophen (TYLENOL) suppository 650 mg   . acetaminophen (TYLENOL) solution 650 mg   . guaiFENesin (MUCINEX) 12 hr tablet 600 mg   . DOPamine (INTROPIN) 800 mg in dextrose 5 % 250 mL (3.2 mg/mL) infusion   . fentaNYL (SUBLIMAZE) injection 50 mcg   . fentaNYL (SUBLIMAZE) 2,500 mcg in sodium chloride 0.9 % 250 mL (10 mcg/mL) infusion   . fentaNYL (SUBLIMAZE) bolus via infusion 25 mcg   . norepinephrine (LEVOPHED) 16 mg in dextrose 5 % 250 mL (0.064 mg/mL) infusion   . lidocaine (cardiac) 100 mg/37ml (XYLOCAINE) 20 MG/ML injection 2% Returned to [facility]    Social  History:  reports that she quit smoking about 48 years ago. Her smoking use included Cigarettes. She started smoking about 59 years ago. She has a 10 pack-year smoking history. She has never used smokeless tobacco. She reports that she does not drink alcohol or use illicit drugs.  Family History:   Family History  Problem Relation Age of Onset  . Liver cancer Father   . Diabetes Father     Pertinent items are noted in HPI. Weight change: 0.3 kg (10.6 oz)  Intake/Output Summary (Last 24 hours) at 05/01/15 1309 Last data filed at 05/01/15 1300  Gross per 24 hour  Intake   2405 ml  Output   1558 ml  Net    847 ml   BP 135/47 mmHg  Pulse 83  Temp(Src) 97.4 F (36.3 C) (Axillary)  Resp 25  Ht 5' 8.5" (1.74 m)  Wt 81.3 kg (179 lb 3.7 oz)  BMI 26.85 kg/m2  SpO2 100% Filed Vitals:   05/01/15 1100 05/01/15 1144 05/01/15 1200 05/01/15 1206  BP: 78/32  118/38 135/47  Pulse: 73  80 83  Temp:  97.4 F (36.3 C)    TempSrc:  Axillary    Resp: 21  24 25   Height:      Weight:      SpO2: 100%  100%      General appearance: cachectic and intubated Head: Normocephalic, without obvious abnormality, atraumatic Eyes: negative findings: lids and lashes normal, conjunctivae and sclerae normal and corneas clear Resp: rhonchi bilaterally Cardio: no rub GI: soft, non-tender; bowel sounds normal; no masses,  no organomegaly Extremities: covered in diffuse erythemetous rash with yellowish bullae involving the lower extremities and toes as well as checst and arms  Labs: Basic Metabolic Panel:  Recent Labs Lab 04/25/15 0534 04/26/15 0520 04/27/15 0330 04/28/15 0435 04/29/15 0458 04/30/15 0500 05/01/15 0400  NA 133* 139 142 139 138 138 141  K 3.3* 3.6 3.7 4.4 3.7 3.2* 2.8*  CL 99* 105 109 106 108 108 108  CO2 25 26 25  20* 20* 21* 22  GLUCOSE 119* 85 84 202* 130* 148* 130*  BUN 24* 21* 21* 36* 50* 59* 64*  CREATININE 0.86 0.65 0.88 1.53* 1.79* 1.96* 1.99*  ALBUMIN 1.4*  --   --   --    --   --   --   CALCIUM 7.3* 7.3* 7.6* 7.3* 7.5* 7.3* 7.5*  PHOS 2.8 2.8  --  5.9* 4.9* 4.2 3.8   Liver Function Tests:  Recent Labs Lab 04/25/15 0534  ALBUMIN 1.4*   No results for input(s): LIPASE, AMYLASE in the last 168 hours. No results for input(s): AMMONIA in the last 168 hours. CBC:  Recent Labs Lab 04/28/15 0435 04/29/15 0458 04/30/15 0500 05/01/15 0400  WBC 13.2* 14.1* 8.6 7.5  NEUTROABS  --   --  8.2* 6.8  HGB 11.0* 8.5* 7.8* 7.9*  HCT 33.8* 26.3* 23.9* 24.2*  MCV 92.9 91.6 89.2 89.3  PLT 94* 114* 101* 110*   PT/INR: @LABRCNTIP (inr:5) Cardiac Enzymes: )No results for input(s): CKTOTAL, CKMB, CKMBINDEX, TROPONINI in the last 168 hours. CBG:  Recent Labs Lab 04/30/15 1622 05/01/15 0004 05/01/15 0358 05/01/15 0913 05/01/15 1143  GLUCAP 142* 132* 117* 131* 156*    Iron Studies: No results for input(s): IRON, TIBC, TRANSFERRIN, FERRITIN in the last 168 hours.  Xrays/Other Studies: Dg Chest Port 1 View  05/01/2015  CLINICAL DATA:  Respiratory failure. EXAM: PORTABLE CHEST 1 VIEW COMPARISON:  04/29/2015. FINDINGS: Endotracheal tube, NG tube, left chest tube in stable position. Heart size stable. Persistent right lower lobe infiltrate and right pleural effusion. No pneumothorax. Surgical clips left axilla. IMPRESSION: 1. Lines and tubes in stable position.  No pneumothorax. 2. Persistent right lower lobe infiltrate with right pleural effusion. No interim change. Electronically Signed   By: Marcello Moores  Register   On: 05/01/2015 07:25     Assessment/Plan: 1.  AKI- in setting of volume depletion and acute illness as well as thrombo-embolic vasculitis involving the skin.  Agree with need to r/o vasculitis and will order serologies.  Will check FeNa but suspect it will be low with low CVP and she may benefit from gentle hydration with IV normal saline if okay with PCCM.  She also had supra-therapeutic vancomycin levels and may have played a role.  Vanc is currently on  hold. 2. Rash- biopsy consistent with thrombo-embolic vasculitis- work up underway.  Rash appears similar to disseminated meningococcemia but pt relatively stable, has had negative cultures, and is on zosyn.  Consider culturing fluid from a bullae. 3. VDRF- weaning trials per PCCM 4. Possible Metastatic breast cancer- Oncology following and no evidence of malignancy found despite an extensive work up.  Cytologies and markers are negative thus far. 5. MGUS- recent work up negative for myeloma 6. Normocytic anemia- transfuse as needed.  Oncology following. 7. Thrombocytopenia 8. Protein and caloric malnutrition- per PCCM  9. Hypokalemia- repleted by PCCM and follow labs 10. HCAP- per PCCM 11. Pericardial tamponade- s/p transxyphoid pericardial window and chest tube on left 12. Adrenal necrosis- on hydrocortisone 13. SIRS- recently off of pressors. 14. Disposition- overall prognosis is poor.  Has had multiple protracted hospitalizations and would consider palliative care consult to help set goals and limits of care.   Yunis Voorheis A 05/01/2015, 1:09 PM

## 2015-05-01 NOTE — Progress Notes (Signed)
SLP Cancellation Note  Patient Details Name: [name] [name] MRN: [id number] DOB: 20-Jul-1935   Cancelled treatment:       Reason Eval/Treat Not Completed: Patient not medically ready, pt is intubated.  Lanier Ensign, Student-SLP  Lanier Ensign 05/01/2015, 7:26 AM

## 2015-05-01 NOTE — Progress Notes (Signed)
[name] [name] is still intubated. She does appear to be somewhat alert when I talk to her.  The problem now is that she has these large areas of ecchymotic lesions on her body with bulla formation. She actually had a biopsy done yesterday.  It is still unclear as to what exactly is going on. We have yet to diagnose malignancy with her. That is still a concern but I would think that we would of had some positive results by now. With the biopsies and with the 2 markers all been negative, I just have a hard time believing that there is an underlying malignancy that is somehow causing all this. I suppose this could still be possible.  She still has a chest tube in. Maybe the fluid in the chest tube can be sent off for cytology. On her last chest x-ray, there is no mention of the right lung nodule. I would not think that putting her through another CT scan would change anything right now.  With her last, her potassium was quite low at 2.8. Her creatinine is 1.99. Her white cell count and platelet count are okay. She is somewhat anemic. Her hemoglobin is 7.9.  So far, all of her cultures have been negative for any obvious infection.  I don't have any great ideas or insight as to how to try to get her better. He will be interesting to see what the biopsy shows on her skin lesion.  I know that everybody in the ICU is doing their best to try to help her out. They are all so compassionate and I know that they are really trying hard.  [name].  Ephesians 6:10

## 2015-05-01 NOTE — Progress Notes (Signed)
eLink Physician-Brief Progress Note Patient Name: [name] [name] DOB: 23-Jul-1934 MRN: [id number]   Date of Service  05/01/2015  HPI/Events of Note  K 2.9. Renal failure noted  eICU Interventions  Cautious repletion of K. Recheck BMP in AM.     Intervention Category Intermediate Interventions: Electrolyte abnormality - evaluation and management  Lymon Kidney 05/01/2015, 4:57 PM

## 2015-05-01 NOTE — Progress Notes (Signed)
Late entry Pt remains tachycardic and tachypenic after multiple NTS per RT. Pt unable to effectively expectorate secretions. Pt skin appearing mottled from trunk to feet. Pt able to follow commands but lethargic. Dr Deterding notified, Critical Care to come to bedside to assess.

## 2015-05-01 NOTE — Progress Notes (Signed)
PULMONARY / CRITICAL CARE MEDICINE   Name: [name] [name] MRN: [id number] DOB: [birth year]    ADMISSION DATE:  03/26/2015  LOS 17 days   REFERRING MD :  [facility]ad  CHIEF COMPLAINT:  Cough  INITIAL PRESENTATION:  80 yo admitted with cough and fever from sepsis and HCAP.  She was found to have cardiac tamponade.  She has hx of breast cancer with possible metastatic disease, MGUS.  STUDIES/EVENT:  02/20/15 - bone marrow - predom CD8 cells 04/03/15 - Left adrenal bx  - extensive necrosis  04/05/15 - splenectomy bx -  ...Marland KitchenMarland KitchenMarland Kitchen 09/25  Admit, oncology consult 9/26  Thoracentesis  9/26  CT chest >> b/l pleural effusions, large pericardial effusion, 2.3 cm RLL nodule (was 1.8 cm) 9/26  ECHO >> tamponade  09/26  Cardiology consulted 09/27  TCTS consulted >> subxyphoid pericardial window. PERICARDIAL BIOPSY - NO EVIDENCE OF MALIGNANCY 09/28  Extubated 10/116 - echo - nromal EF 10/02  Fever, altered mental status >>To ICU. Intubated 10/03  NSVT and torsades.  10/6  MRI Head >> negative for acute pathology  10/06  Extubated  10/07  Reintubated for hypoxemia and AMS. AUTOIMMUNE NEGATIVE 04/28/15: : Reintubated overnight, mottled overnight with high pressor demand. 04/29/15: Off levophed. RN very concerned abotu extensive dermal necrosis all over body. Overall very weak. Follows commands. Looks very decondtioned. On fent gtt 04/30/15 - LEFT ANTERIOR THIGH BX -> 05/01/2015 - off sedation gtt x 24h. Off pressors x 24h. Following commands. SBT just started. RN denies overnight issues other than general weakness and persistence of derm lesions. Tolerating tube feeds. However, creat rising to 1.9   SUBJECTIVE/OVERNIGHT/INTERVAL HX 05/01/15 - no clear evidence of malignancy per Dr Marin Olp notes. Did SBT yesterday but failed. SBT starting now. Palliative care today at 62/03 per Elmo Putt of pallitive. PEr rN no overnight events. Patient nodding understanding to this MD that she knows she is ill and is ok with slow  recovery.     VITAL SIGNS: Temp:  [97.5 F (36.4 C)-99.3 F (37.4 C)] 97.5 F (36.4 C) (10/12 0717) Pulse Rate:  [67-97] 68 (10/12 0823) Resp:  [17-27] 22 (10/12 0800) BP: (90-144)/(37-108) 114/42 mmHg (10/12 0823) SpO2:  [100 %] 100 % (10/12 0800) Arterial Line BP: (101-179)/(35-66) 114/39 mmHg (10/12 0800) FiO2 (%):  [40 %] 40 % (10/12 0824) Weight:  [81.3 kg (179 lb 3.7 oz)] 81.3 kg (179 lb 3.7 oz) (10/12 0500)   HEMODYNAMICS: CVP:  [0 mmHg-10 mmHg] 3 mmHg   VENTILATOR SETTINGS: Vent Mode:  [-] PRVC FiO2 (%):  [40 %] 40 % Set Rate:  [22 bmp] 22 bmp Vt Set:  [390 mL] 390 mL PEEP:  [5 cmH20] 5 cmH20 Pressure Support:  [5 cmH20] 5 cmH20 Plateau Pressure:  [16 cmH20-18 cmH20] 18 cmH20   INTAKE / OUTPUT:  Intake/Output Summary (Last 24 hours) at 05/01/15 0831 Last data filed at 05/01/15 0800  Gross per 24 hour  Intake   2020 ml  Output   1615 ml  Net    405 ml   PHYSICAL EXAMINATION: General: chronically ill appearing female  Neuro:  RASS 0. Deconditioned. Does not appear delirious. Follows commands Cardiovascular:  S1, S2, No MRG. Lungs:  Even/non-labored, coarse BS diffusely. Abdomen:  Soft, non tender, BSx4 active  Musculoskeletal:  No acute deformities Skin:  Extensive epidram necrosis like lesions across forearm, thigh and even nose   LABS:  PULMONARY  Recent Labs Lab 04/27/15 0913 04/27/15 2105 04/27/15 2116 04/27/15 2240 04/28/15 1520 04/29/15 0405  PHART 7.411  --  7.222* TEST WILL BE CREDITED 7.332* 7.330*  PCO2ART 37.8  --  45.6* TEST WILL BE CREDITED 38.8 39.6  PO2ART 39.0*  --  47.0* TEST WILL BE CREDITED 229.0* 194*  HCO3 23.5 18.4* 18.7* TEST WILL BE CREDITED 20.6 20.3  TCO2 25 20 20  TEST WILL BE CREDITED 22 21.5  O2SAT 67.0 50.0 74.0 TEST WILL BE CREDITED 100.0 99.2    CBC  Recent Labs Lab 04/29/15 0458 04/30/15 0500 05/01/15 0400  HGB 8.5* 7.8* 7.9*  HCT 26.3* 23.9* 24.2*  WBC 14.1* 8.6 7.5  PLT 114* 101* 110*     COAGULATION  Recent Labs Lab 04/27/15 0329 04/30/15 0500  INR 1.77* 1.29    CARDIAC  No results for input(s): TROPONINI in the last 168 hours. No results for input(s): PROBNP in the last 168 hours.   CHEMISTRY  Recent Labs Lab 04/26/15 0520 04/27/15 0330 04/28/15 0435 04/29/15 0458 04/30/15 0500 05/01/15 0400  NA 139 142 139 138 138 141  K 3.6 3.7 4.4 3.7 3.2* 2.8*  CL 105 109 106 108 108 108  CO2 26 25 20* 20* 21* 22  GLUCOSE 85 84 202* 130* 148* 130*  BUN 21* 21* 36* 50* 59* 64*  CREATININE 0.65 0.88 1.53* 1.79* 1.96* 1.99*  CALCIUM 7.3* 7.6* 7.3* 7.5* 7.3* 7.5*  MG 1.6*  --  2.4 2.2 2.2 2.0  PHOS 2.8  --  5.9* 4.9* 4.2 3.8   Estimated Creatinine Clearance: 26.3 mL/min (by C-G formula based on Cr of 1.99).   LIVER  Recent Labs Lab 04/25/15 0534 04/27/15 0329 04/30/15 0500  ALBUMIN 1.4*  --   --   INR  --  1.77* 1.29     INFECTIOUS  Recent Labs Lab 04/25/15 1055 04/26/15 0520 04/27/15 0330 04/27/15 0715 04/30/15 0500  LATICACIDVEN  --   --  2.9* 2.4* 2.3*  PROCALCITON 1.82 1.00 0.88  --   --      ENDOCRINE CBG (last 3)   Recent Labs  04/30/15 1622 05/01/15 0004 05/01/15 0358  GLUCAP 142* 132* 117*         IMAGING x48h  - image(s) personally visualized  -   highlighted in bold Dg Chest Port 1 View  05/01/2015  CLINICAL DATA:  Respiratory failure. EXAM: PORTABLE CHEST 1 VIEW COMPARISON:  04/29/2015. FINDINGS: Endotracheal tube, NG tube, left chest tube in stable position. Heart size stable. Persistent right lower lobe infiltrate and right pleural effusion. No pneumothorax. Surgical clips left axilla. IMPRESSION: 1. Lines and tubes in stable position.  No pneumothorax. 2. Persistent right lower lobe infiltrate with right pleural effusion. No interim change. Electronically Signed   By: Marcello Moores  Register   On: 05/01/2015 07:25       ASSESSMENT / PLAN:  PULMONARY A: Acute hypoxic respiratory failure. Rt lung  nodule. Pleural effusions - thora 9/26 with negative cultures ETT 10/2>>>10/6>>>10/7>>>   - failure to wean (failed 04/30/15) ; currently due to decondituioning  P:   Daily SBT - needs trach/LTAC if family decides to pursue full code Continue full vent support Will need a biopsy of the RLL opacity v followup CT chest  at some point when stable for work up of malignancy. Oxygen as needed to support saturations > 92%  CARDIOVASCULAR Lt IJ CVL 9/27>>>  A:  Pericardial effusion with tamponade s/p window 9/27 with resultant chest tube. Nondiagnostic bx Hx of diastolic CHF, CAD, HLD. NSVT and torsades - none since 10/3 On hydrocort since  04/27/15 plasma cortisol of 0.6, definitely adrenally insufficient.    - off pressors since 04/29/15 AM P:  Monitor hemodynamics. Continue solucortef, Chest tube management per CVTS  RENAL A:   AKI.  - getting worse despite lasix test dose 04/30/15. Hypokalemia repleted by eMD   P:   DC vancmycin Fluid bolus and recheck bmet 3pm 05/01/2015 Monitor renal fx. Replace electrolytes as needed. BMET in AM. Avoid nephrotix drugs  GASTROINTESTINAL A:   Nutrition.  - tolerating tube feeds  P:   Tube feeds SLP following. PPI - home medication.  HEMATOLOGIC A:   Hx of splenectomy, MGUS, breast cancer. Thrombocytopenia - may be secondary to stress, sepsis   - nil acute  P:  Will try to reach Dr Olevia Perches for diagnosis/prognosis Trend CBC  Monitor for bleeding  SCD's for DVT prophylaxis   INFECTIOUS   Blood 10/02 >> NTD Urine 10/02 >> NTD Sputum 10/02 >> normal flora  Blood 10/8>>> Sputum 10/8>>>      A:   HCAP P:   Zosyn 10/8>>> Vanc start date unclear to me>>> 05/01/2015 (MRSA PCR negative, culture negative and rising creat)   ENDOCRINE A:   Adrenal insufficiency. Necrotic adrenals seen on recent biopsy P:   Hydrocortisone  AUTOIMMUNE ANCA >> NEG DS DNA >> neg SCL70 >> neg RF >> 30 SSA/SSB >> neg IgG  low IgA normal   A: No evidence of major autoimmune dz - 04/26/15 panel.  negative malignant work up thus far Ongoing skin lesions +  P:  biopsy of skinlesions if more stable. -> d/w Emina Roerbhock [facility] APP  NEUROLOGIC A:   Acute encephalopathy   - improving/resolved  P:   Prn fent and versed Monitor mental status Hold lexapro   ONCOLOGY A: No evidence of ongoing current malignancy . There is only hx of remote breast cancer > 20 years ago - see Dr Marin Olp notes 27/03/50  P  - uncertain prognosis. No terminal dx found  Overall  - imprioving but very decodnitioned. Etiology unclear but seems to have a complex problem. PEr Dr Marin Olp 05/01/15 notes- no terminal diagnosis found. Currently has look of chronic critical illness. Prognosis uncertain. She seems to indicate desire for recovery even if slow process (as nodded to CCM MD 05/01/15). Rising creat is primary worrisome concern - stop vanc, give fluids   FAMILY:  Updated daughter over the phone 10/8, no family bedside 10/9. Healthcare providers  (various) concerned daughter might not be "realistic" about prognosis. GOC:  Full code. Dw son [name] 04/29/15  - explained patient is heading into LTAC/chronic critical illness istuation. Says sister/dtr of patient resistant for DNR. Explained need to have palliative care discussion more as supportive and to delinate goals of care  as opposed to code status . They both are agreeable to meet iwht pall care for goals 05/01/15 at 17.45.     The patient is critically ill with multiple organ systems failure and requires high complexity decision making for assessment and support, frequent evaluation and titration of therapies, application of advanced monitoring technologies and extensive interpretation of multiple databases.   Critical Care Time devoted to patient care services described in this note is  35 Minutes. This time reflects time of care of this signee Dr Brand Males. This  critical care time does not reflect procedure time, or teaching time or supervisory time of PA/NP/Med student/Med Resident etc but could involve care discussion time    Dr. Brand Males, M.D., [facility].C.P Pulmonary and Critical Care Medicine  Staff Physician [facility] Pager: [phone], If no answer or between  15:00h - 7:00h: call [phone]  05/01/2015 8:31 AM

## 2015-05-01 NOTE — Progress Notes (Signed)
[facility] Progress Note Patient Name: [name] [name] DOB: 1935/05/11 MRN: [id number]   Date of Service  05/01/2015  HPI/Events of Note  Low K  eICU Interventions  Replaced     Intervention Category Major Interventions: Other:  Felicity Penix 05/01/2015, 6:33 AM

## 2015-05-01 NOTE — Progress Notes (Signed)
Patient ID: [name] [name], female   DOB: [birth year], 80 y.o.   MRN: [id number]    Pathology results: Thrombotic vasculopathy  Negative for direct immunoreactant's   [facility] pathology Dr. Tamala Julian [phone]  Discussed with Dr. Chase Caller who will further manage.   Please call with questions.  Eliora Nienhuis, ANP-BC

## 2015-05-02 ENCOUNTER — Inpatient Hospital Stay (HOSPITAL_COMMUNITY): Payer: Medicare Other

## 2015-05-02 DIAGNOSIS — N179 Acute kidney failure, unspecified: Secondary | ICD-10-CM

## 2015-05-02 LAB — CBC WITH DIFFERENTIAL/PLATELET
BASOS ABS: 0 10*3/uL (ref 0.0–0.1)
Basophils Relative: 0 %
EOS ABS: 0.1 10*3/uL (ref 0.0–0.7)
Eosinophils Relative: 1 %
HCT: 25.7 % — ABNORMAL LOW (ref 36.0–46.0)
HEMOGLOBIN: 8.4 g/dL — AB (ref 12.0–15.0)
LYMPHS PCT: 1 %
Lymphs Abs: 0.1 10*3/uL — ABNORMAL LOW (ref 0.7–4.0)
MCH: 29.8 pg (ref 26.0–34.0)
MCHC: 32.7 g/dL (ref 30.0–36.0)
MCV: 91.1 fL (ref 78.0–100.0)
Monocytes Absolute: 0.4 10*3/uL (ref 0.1–1.0)
Monocytes Relative: 4 %
NEUTROS ABS: 10.2 10*3/uL — AB (ref 1.7–7.7)
Neutrophils Relative %: 94 %
Platelets: 127 10*3/uL — ABNORMAL LOW (ref 150–400)
RBC: 2.82 MIL/uL — ABNORMAL LOW (ref 3.87–5.11)
RDW: 17.7 % — AB (ref 11.5–15.5)
WBC: 10.8 10*3/uL — AB (ref 4.0–10.5)

## 2015-05-02 LAB — RENAL FUNCTION PANEL
ALBUMIN: 1.2 g/dL — AB (ref 3.5–5.0)
ANION GAP: 14 (ref 5–15)
BUN: 74 mg/dL — AB (ref 6–20)
CALCIUM: 7.9 mg/dL — AB (ref 8.9–10.3)
CO2: 22 mmol/L (ref 22–32)
Chloride: 111 mmol/L (ref 101–111)
Creatinine, Ser: 1.92 mg/dL — ABNORMAL HIGH (ref 0.44–1.00)
GFR calc Af Amer: 28 mL/min — ABNORMAL LOW (ref >60)
GFR calc non Af Amer: 24 mL/min — ABNORMAL LOW (ref >60)
Glucose, Bld: 155 mg/dL — ABNORMAL HIGH (ref 65–99)
PHOSPHORUS: 3.9 mg/dL (ref 2.5–4.6)
Potassium: 3 mmol/L — ABNORMAL LOW (ref 3.5–5.1)
SODIUM: 147 mmol/L — AB (ref 135–145)

## 2015-05-02 LAB — ANTISTREPTOLYSIN O TITER: ASO: 21 [IU]/mL (ref 0.0–200.0)

## 2015-05-02 LAB — BASIC METABOLIC PANEL
Anion gap: 14 (ref 5–15)
Anion gap: 11 (ref 5–15)
BUN: 76 mg/dL — AB (ref 6–20)
BUN: 78 mg/dL — ABNORMAL HIGH (ref 6–20)
CHLORIDE: 111 mmol/L (ref 101–111)
CO2: 23 mmol/L (ref 22–32)
CO2: 22 mmol/L (ref 22–32)
CREATININE: 1.99 mg/dL — AB (ref 0.44–1.00)
Calcium: 8 mg/dL — ABNORMAL LOW (ref 8.9–10.3)
Calcium: 7.4 mg/dL — ABNORMAL LOW (ref 8.9–10.3)
Chloride: 108 mmol/L (ref 101–111)
Creatinine, Ser: 1.89 mg/dL — ABNORMAL HIGH (ref 0.44–1.00)
GFR calc Af Amer: 27 mL/min — ABNORMAL LOW (ref >60)
GFR calc Af Amer: 28 mL/min — ABNORMAL LOW (ref >60)
GFR calc non Af Amer: 23 mL/min — ABNORMAL LOW (ref >60)
GFR calc non Af Amer: 24 mL/min — ABNORMAL LOW (ref >60)
GLUCOSE: 171 mg/dL — AB (ref 65–99)
Glucose, Bld: 179 mg/dL — ABNORMAL HIGH (ref 65–99)
Potassium: 2.9 mmol/L — ABNORMAL LOW (ref 3.5–5.1)
Potassium: 3 mmol/L — ABNORMAL LOW (ref 3.5–5.1)
Sodium: 148 mmol/L — ABNORMAL HIGH (ref 135–145)
Sodium: 141 mmol/L (ref 135–145)

## 2015-05-02 LAB — GLUCOSE, CAPILLARY
Glucose-Capillary: 150 mg/dL — ABNORMAL HIGH (ref 65–99)
Glucose-Capillary: 152 mg/dL — ABNORMAL HIGH (ref 65–99)
Glucose-Capillary: 156 mg/dL — ABNORMAL HIGH (ref 65–99)
Glucose-Capillary: 158 mg/dL — ABNORMAL HIGH (ref 65–99)
Glucose-Capillary: 161 mg/dL — ABNORMAL HIGH (ref 65–99)

## 2015-05-02 LAB — C3 COMPLEMENT: C3 COMPLEMENT: 62 mg/dL — AB (ref 82–167)

## 2015-05-02 LAB — CULTURE, BLOOD (ROUTINE X 2): Culture: NO GROWTH

## 2015-05-02 LAB — MAGNESIUM: Magnesium: 2 mg/dL (ref 1.7–2.4)

## 2015-05-02 LAB — GLOMERULAR BASEMENT MEMBRANE ANTIBODIES: GBM AB: 3 U (ref 0–20)

## 2015-05-02 LAB — ANTINUCLEAR ANTIBODIES, IFA: ANTINUCLEAR ANTIBODIES, IFA: NEGATIVE

## 2015-05-02 LAB — ANTI-DNA ANTIBODY, DOUBLE-STRANDED: ds DNA Ab: 3 IU/mL (ref 0–9)

## 2015-05-02 LAB — MPO/PR-3 (ANCA) ANTIBODIES
ANCA Proteinase 3: <3.5 U/mL (ref 0.0–3.5)
Myeloperoxidase Abs: <9 U/mL (ref 0.0–9.0)

## 2015-05-02 LAB — C4 COMPLEMENT

## 2015-05-02 LAB — COMPLEMENT, TOTAL: COMPL TOTAL (CH50): 20 U/mL — AB (ref 42–60)

## 2015-05-02 MED ORDER — ADULT MULTIVITAMIN LIQUID CH
5.0000 mL | Freq: Every day | ORAL | Status: DC
  Administered 2015-05-02 – 2015-05-05 (×4): 5 mL via ORAL
  Filled 2015-05-02 (×5): qty 5

## 2015-05-02 MED ORDER — FREE WATER
200.0000 mL | Freq: Three times a day (TID) | Status: DC
  Administered 2015-05-02 – 2015-05-04 (×9): 200 mL

## 2015-05-02 MED ORDER — POTASSIUM CHLORIDE 10 MEQ/50ML IV SOLN
10.0000 meq | INTRAVENOUS | Status: AC
  Administered 2015-05-02 (×2): 10 meq via INTRAVENOUS

## 2015-05-02 MED ORDER — POTASSIUM CHLORIDE 10 MEQ/50ML IV SOLN
10.0000 meq | INTRAVENOUS | Status: AC
  Administered 2015-05-02 (×2): 10 meq via INTRAVENOUS
  Filled 2015-05-02 (×2): qty 50

## 2015-05-02 MED ORDER — SODIUM CHLORIDE 0.45 % IV SOLN
INTRAVENOUS | Status: DC
  Administered 2015-05-02 (×2): via INTRAVENOUS
  Administered 2015-05-03: 1 mL via INTRAVENOUS
  Administered 2015-05-04: 03:00:00 via INTRAVENOUS

## 2015-05-02 MED ORDER — POTASSIUM CHLORIDE 10 MEQ/50ML IV SOLN
10.0000 meq | INTRAVENOUS | Status: AC | PRN
  Administered 2015-05-02 – 2015-05-03 (×3): 10 meq via INTRAVENOUS

## 2015-05-02 MED ORDER — POTASSIUM CHLORIDE 10 MEQ/50ML IV SOLN
INTRAVENOUS | Status: AC
  Filled 2015-05-02: qty 100

## 2015-05-02 MED ORDER — METHYLPREDNISOLONE SODIUM SUCC 125 MG IJ SOLR
125.0000 mg | Freq: Four times a day (QID) | INTRAMUSCULAR | Status: DC
  Administered 2015-05-02 – 2015-05-05 (×13): 125 mg via INTRAVENOUS
  Filled 2015-05-02 (×15): qty 2

## 2015-05-02 MED ORDER — POTASSIUM CHLORIDE 10 MEQ/50ML IV SOLN
10.0000 meq | INTRAVENOUS | Status: DC | PRN
  Filled 2015-05-02 (×9): qty 50

## 2015-05-02 NOTE — Progress Notes (Signed)
Patient ID: [name] [name], female   DOB: 21-Mar-1935, 80 y.o.   MRN: [id number] S:intubated but awake/alert O:BP 142/43 mmHg  Pulse 93  Temp(Src) 98.3 F (36.8 C) (Oral)  Resp 24  Ht 5' 8.5" (1.74 m)  Wt 81.3 kg (179 lb 3.7 oz)  BMI 26.85 kg/m2  SpO2 100%  Intake/Output Summary (Last 24 hours) at 05/02/15 0744 Last data filed at 05/02/15 0700  Gross per 24 hour  Intake 2697.5 ml  Output   2235 ml  Net  462.5 ml   Intake/Output: I/O last 3 completed shifts: In: 3837.5 [I.V.:340; Other:130; NG/GT:2580; IV Piggyback:787.5] Out: 3120 [Urine:1145; Stool:1825; Chest Tube:150]  Intake/Output this shift:    Weight change: 0 kg (0 lb) XBW:IOMBTDHRCB ill-appearing WF intubated CVS:no rub Resp:decreased BS at bases ULA:GTXMIW OEH:OZYYQMG confluent retiucilar rash over body with multiple bullae   Recent Labs Lab 04/26/15 0520 04/27/15 0330 04/28/15 0435 04/29/15 0458 04/30/15 0500 05/01/15 0400 05/01/15 1345 05/02/15 0400  NA 139 142 139 138 138 141 140 147*  K 3.6 3.7 4.4 3.7 3.2* 2.8* 2.9* 3.0*  CL 105 109 106 108 108 108 109 111  CO2 26 25 20* 20* 21* 22 22 22   GLUCOSE 85 84 202* 130* 148* 130* 150* 155*  BUN 21* 21* 36* 50* 59* 64* 65* 74*  CREATININE 0.65 0.88 1.53* 1.79* 1.96* 1.99* 1.98* 1.92*  ALBUMIN  --   --   --   --   --   --   --  1.2*  CALCIUM 7.3* 7.6* 7.3* 7.5* 7.3* 7.5* 7.4* 7.9*  PHOS 2.8  --  5.9* 4.9* 4.2 3.8  --  3.9   Liver Function Tests:  Recent Labs Lab 05/02/15 0400  ALBUMIN 1.2*   No results for input(s): LIPASE, AMYLASE in the last 168 hours. No results for input(s): AMMONIA in the last 168 hours. CBC:  Recent Labs Lab 04/28/15 0435 04/29/15 0458 04/30/15 0500 05/01/15 0400 05/02/15 0359  WBC 13.2* 14.1* 8.6 7.5 10.8*  NEUTROABS  --   --  8.2* 6.8 10.2*  HGB 11.0* 8.5* 7.8* 7.9* 8.4*  HCT 33.8* 26.3* 23.9* 24.2* 25.7*  MCV 92.9 91.6 89.2 89.3 91.1  PLT 94* 114* 101* 110* 127*   Cardiac Enzymes: No results for input(s): CKTOTAL,  CKMB, CKMBINDEX, TROPONINI in the last 168 hours. CBG:  Recent Labs Lab 05/01/15 0913 05/01/15 1143 05/01/15 1553 05/01/15 2104 05/02/15 0026  GLUCAP 131* 156* 131* 170* 152*    Iron Studies: No results for input(s): IRON, TIBC, TRANSFERRIN, FERRITIN in the last 72 hours. Studies/Results: Dg Chest Port 1 View  05/02/2015  CLINICAL DATA:  Chest tube. EXAM: PORTABLE CHEST 1 VIEW COMPARISON:  05/01/2015. FINDINGS: Endotracheal tube, left IJ line, NG tube in stable position. Left chest tube in stable position. Tiny left apical pneumothorax cannot be excluded. Right lower lobe infiltrate with right pleural effusion again noted. No interim change. IMPRESSION: 1. Left chest tube in stable position. Tiny left apical pneumothorax cannot be excluded. 2. Remaining lines and tubes in stable position. 3. Persistent right lower lobe infiltrate and right pleural effusion. No interim change. Critical Value/emergent results were called by telephone at the time of interpretation on 05/02/2015 at 7:36 am to nurse Baxter Flattery, who verbally acknowledged these results. Electronically Signed   By: Marcello Moores  Register   On: 05/02/2015 07:38   Dg Chest Port 1 View  05/01/2015  CLINICAL DATA:  Respiratory failure. EXAM: PORTABLE CHEST 1 VIEW COMPARISON:  04/29/2015. FINDINGS: Endotracheal tube, NG tube,  left chest tube in stable position. Heart size stable. Persistent right lower lobe infiltrate and right pleural effusion. No pneumothorax. Surgical clips left axilla. IMPRESSION: 1. Lines and tubes in stable position.  No pneumothorax. 2. Persistent right lower lobe infiltrate with right pleural effusion. No interim change. Electronically Signed   By: Marcello Moores  Register   On: 05/01/2015 07:25   . antiseptic oral rinse  7 mL Mouth Rinse QID  . chlorhexidine gluconate  15 mL Mouth Rinse BID  . folic acid  2 mg Per Tube Daily  . Gerhardt's butt cream   Topical Daily  . hydrocortisone sodium succinate  50 mg Intravenous Q6H  .  insulin aspart  0-9 Units Subcutaneous 6 times per day  . pantoprazole sodium  40 mg Per Tube Daily  . piperacillin-tazobactam (ZOSYN)  IV  3.375 g Intravenous Q8H  . [COMPLETED] potassium chloride  10 mEq Intravenous Q1 Hr x 2  . raloxifene  60 mg Oral q morning - 10a  . sodium chloride  1,000 mL Intravenous Once  . sodium chloride  10-40 mL Intracatheter Q12H    BMET    Component Value Date/Time   NA 147* 05/02/2015 0400   NA 137 10/08/2014 1321   K 3.0* 05/02/2015 0400   K 3.3 10/08/2014 1321   CL 111 05/02/2015 0400   CL 98 10/08/2014 1321   CO2 22 05/02/2015 0400   CO2 31 10/08/2014 1321   GLUCOSE 155* 05/02/2015 0400   GLUCOSE 98 10/08/2014 1321   BUN 74* 05/02/2015 0400   BUN 5* 10/08/2014 1321   CREATININE 1.92* 05/02/2015 0400   CREATININE 1.0 10/08/2014 1321   CALCIUM 7.9* 05/02/2015 0400   CALCIUM 9.4 10/08/2014 1321   GFRNONAA 24* 05/02/2015 0400   GFRAA 28* 05/02/2015 0400   CBC    Component Value Date/Time   WBC 10.8* 05/02/2015 0359   WBC 2.1* 10/08/2014 1321   WBC 2.3* 12/19/2007 0820   RBC 2.82* 05/02/2015 0359   RBC 3.71 10/08/2014 1321   RBC 3.79* 04/01/2011 0959   RBC 4.32 12/19/2007 0820   HGB 8.4* 05/02/2015 0359   HGB 12.2 10/08/2014 1321   HGB 14.0 12/19/2007 0820   HCT 25.7* 05/02/2015 0359   HCT 35.9 10/08/2014 1321   HCT 41.0 12/19/2007 0820   PLT 127* 05/02/2015 0359   PLT 86* 10/08/2014 1321   PLT 110* 12/19/2007 0820   MCV 91.1 05/02/2015 0359   MCV 97 10/08/2014 1321   MCV 94.9 12/19/2007 0820   MCH 29.8 05/02/2015 0359   MCH 32.9 10/08/2014 1321   MCH 32.4 12/19/2007 0820   MCHC 32.7 05/02/2015 0359   MCHC 34.0 10/08/2014 1321   MCHC 34.2 12/19/2007 0820   RDW 17.7* 05/02/2015 0359   RDW 14.9 10/08/2014 1321   RDW 13.2 12/19/2007 0820   LYMPHSABS 0.1* 05/02/2015 0359   LYMPHSABS 0.2* 10/08/2014 1321   LYMPHSABS 0.3* 12/19/2007 0820   MONOABS 0.4 05/02/2015 0359   MONOABS 0.2 12/19/2007 0820   EOSABS 0.1 05/02/2015 0359    EOSABS 0.1 10/08/2014 1321   EOSABS 0.1 12/19/2007 0820   BASOSABS 0.0 05/02/2015 0359   BASOSABS 0.0 10/08/2014 1321   BASOSABS 0.0 12/19/2007 0820     Assessment/Plan: 1. AKI- in setting of volume depletion and acute illness as well as thrombo-embolic vasculitis involving the skin. Agree with need to r/o vasculitis and will order serologies. Will check FeNa but suspect it will be low with low CVP and she may benefit from  gentle hydration with IV normal saline if okay with PCCM. She also had supra-therapeutic vancomycin levels and may have played a role. Vanc is currently on hold. 1. Low complements and unable to perform cryoglobulin due to the fact her blood would not separate in centrifuge 2. Low FeNa consistent with volume depletion but could also reflect acute GN 3. Start IVF's (1/2 NS due to hypernatremia) but she is too unstable for renal biopsy and recommend conservative therapy 4. Awaiting results of ANA, dsDNA, and ANCA and need to discuss with Heme what conditions are associated with inability to separate serum 2. Rash- biopsy consistent with thrombo-embolic vasculitis- work up underway. Rash appears similar to disseminated meningococcemia but pt relatively stable, has had negative cultures, and is on zosyn. Consider culturing fluid from a bullae. 3. Hypernatremia- free water deficit is 2 liters.  Will start IVF's of 1/2 NS at 100cc/hr and free water boluses as well and follow. 4. VDRF- weaning trials per PCCM 5. Possible Metastatic breast cancer- Oncology following and no evidence of malignancy found despite an extensive work up. Cytologies and markers are negative thus far. 6. MGUS- recent work up negative for myeloma 7. Normocytic anemia- transfuse as needed. Oncology following. 8. Thrombocytopenia 9. Protein and caloric malnutrition- per PCCM  10. Hypokalemia- repleted by PCCM and follow labs 11. HCAP- per PCCM 12. Pericardial tamponade- s/p transxyphoid  pericardial window and chest tube on left 13. Adrenal necrosis- on hydrocortisone 14. SIRS- recently off of pressors. 15. Disposition- overall prognosis is poor. Has had multiple protracted hospitalizations and appreciate palliative care consult to help set goals and limits of care.  [place] A

## 2015-05-02 NOTE — Plan of Care (Signed)
Changed bilateral arm dressings and left thigh

## 2015-05-02 NOTE — Progress Notes (Signed)
PULMONARY / CRITICAL CARE MEDICINE   Name: [name] [name] MRN: [id number] DOB: 30-Mar-1935    ADMISSION DATE:  03/22/2015  LOS 18 days   REFERRING MD :  [facility]  CHIEF COMPLAINT:  Cough  INITIAL PRESENTATION:  80 yo admitted with cough and fever from sepsis and HCAP.  She was found to have cardiac tamponade.  She has hx of breast cancer with possible metastatic disease, MGUS.  STUDIES/EVENT:  02/20/15 - bone marrow - predom CD8 cells 04/03/15 - Left adrenal bx  - extensive necrosis  04/05/15 - splenectomy bx -  ...Marland KitchenMarland KitchenMarland Kitchen 09/25  Admit, oncology consult 9/26  Thoracentesis  9/26  CT chest >> b/l pleural effusions, large pericardial effusion, 2.3 cm RLL nodule (was 1.8 cm) 9/26  ECHO >> tamponade  09/26  Cardiology consulted 09/27  TCTS consulted >> subxyphoid pericardial window. PERICARDIAL BIOPSY - NO EVIDENCE OF MALIGNANCY 09/28  Extubated 10/116 - echo - nromal EF 10/02  Fever, altered mental status >>To ICU. Intubated 10/03  NSVT and torsades.  10/6  MRI Head >> negative for acute pathology  10/06  Extubated  10/07  Reintubated for hypoxemia and AMS. AUTOIMMUNE NEGATIVE (ds dna, ana, rF, ANCA, Ig, ssa, ssB, scl70) 04/28/15: : Reintubated overnight, mottled overnight with high pressor demand. 04/29/15: Off levophed. RN very concerned abotu extensive dermal necrosis all over body. Overall very weak. Follows commands. Looks very decondtioned. On fent gtt 04/30/15 - LEFT ANTERIOR THIGH BX -> 05/02/2015 - off sedation gtt x 24h. Off pressors x 24h. Following commands. SBT just started. RN denies overnight issues other than general weakness and persistence of derm lesions. Tolerating tube feeds. However, creat rising to 1.9   05/01/15 - no clear evidence of malignancy per Dr Marin Olp notes. Did SBT yesterday but failed. SBT starting now. Palliative care today at 02/54 per Elmo Putt of pallitive. PEr rN no overnight events. Patient nodding understanding to this MD that she knows she is ill and is ok  with slow recovery.    SUBJECTIVE/OVERNIGHT/INTERVAL HX 05/02/15: derm bx - prelim d./w DR Caryl Ada  thrombotic changes in vessels. No evidence of vasculitys. 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 DIF negative - so Non IgA. Not leukocytoclastvic vasculitis. Ddx -  Lupus anticoag, Cyroglobulinemia,  HUS-TTP, Early DIC (doubt due to lack of epidremal necrosis). No evidence of vasculitis . No evidence of Wegners. No neutrophils seen. Dr Tamala Julian feel patient is necrosing but not sure why. Per renal note 05/02/15 : "Low complements on 05/01/15 and unable to perform cryoglobulin due to the fact her blood would not separate in centrifuge"   Pall care meet yeserday 05/01/15: full code + likley LTAC/trach +ve but patient/family overwhelmed but will be re-meeting with pall. Care today. No other change. Creat worsening - renal following  VITAL SIGNS: Temp:  [97.4 F (36.3 C)-98.8 F (37.1 C)] 98.8 F (37.1 C) (10/13 0700) Pulse Rate:  [73-102] 91 (10/13 0858) Resp:  [21-36] 27 (10/13 0858) BP: (78-155)/(32-62) 134/51 mmHg (10/13 0800) SpO2:  [100 %] 100 % (10/13 0858) Arterial Line BP: (130-137)/(44-45) 135/45 mmHg (10/12 1600) FiO2 (%):  [40 %] 40 % (10/13 0858) Weight:  [81.3 kg (179 lb 3.7 oz)] 81.3 kg (179 lb 3.7 oz) (10/13 0200)   HEMODYNAMICS: CVP:  [4 mmHg] 4 mmHg   VENTILATOR SETTINGS: Vent Mode:  [-] CPAP;PSV FiO2 (%):  [40 %] 40 % Set Rate:  [22 bmp-27 bmp] 27 bmp Vt Set:  [390 mL] 390 mL PEEP:  [5 cmH20] 5 cmH20 Pressure  Support:  [5 cmH20-10 cmH20] [place] Pressure:  [14 cmH20-18 cmH20] 18 cmH20   INTAKE / OUTPUT:  Intake/Output Summary (Last 24 hours) at 05/02/15 1026 Last data filed at 05/02/15 0900  Gross per 24 hour  Intake 2975.83 ml  Output   2302 ml  Net 673.83 ml   PHYSICAL EXAMINATION: General: chronically ill appearing female  Neuro:  RASS 0 ot -1, . Deconditioned. Does not appear delirious. Follows commands Cardiovascular:  S1,  S2, No MRG. Lungs:  Even/non-labored, coarse BS diffusely. Abdomen:  Soft, non tender, BSx4 active  Musculoskeletal:  No acute deformities Skin:  Extensive necrosis like lesions across forearm, thigh and even nose   LABS:  PULMONARY  Recent Labs Lab 04/27/15 0913 04/27/15 2105 04/27/15 2116 04/27/15 2240 04/28/15 1520 04/29/15 0405  PHART 7.411  --  7.222* TEST WILL BE CREDITED 7.332* 7.330*  PCO2ART 37.8  --  45.6* TEST WILL BE CREDITED 38.8 39.6  PO2ART 39.0*  --  47.0* TEST WILL BE CREDITED 229.0* 194*  HCO3 23.5 18.4* 18.7* TEST WILL BE CREDITED 20.6 20.3  TCO2 25 20 20  TEST WILL BE CREDITED 22 21.5  O2SAT 67.0 50.0 74.0 TEST WILL BE CREDITED 100.0 99.2    CBC  Recent Labs Lab 04/30/15 0500 05/01/15 0400 05/02/15 0359  HGB 7.8* 7.9* 8.4*  HCT 23.9* 24.2* 25.7*  WBC 8.6 7.5 10.8*  PLT 101* 110* 127*    COAGULATION  Recent Labs Lab 04/27/15 0329 04/30/15 0500  INR 1.77* 1.29    CARDIAC  No results for input(s): TROPONINI in the last 168 hours. No results for input(s): PROBNP in the last 168 hours.   CHEMISTRY  Recent Labs Lab 04/28/15 0435 04/29/15 0458 04/30/15 0500 05/01/15 0400 05/01/15 1345 05/02/15 0400 05/02/15 0753  NA 139 138 138 141 140 147* 148*  K 4.4 3.7 3.2* 2.8* 2.9* 3.0* 2.9*  CL 106 108 108 108 109 111 111  CO2 20* 20* 21* 22 22 22 23   GLUCOSE 202* 130* 148* 130* 150* 155* 171*  BUN 36* 50* 59* 64* 65* 74* 76*  CREATININE 1.53* 1.79* 1.96* 1.99* 1.98* 1.92* 1.99*  CALCIUM 7.3* 7.5* 7.3* 7.5* 7.4* 7.9* 8.0*  MG 2.4 2.2 2.2 2.0  --  2.0  --   PHOS 5.9* 4.9* 4.2 3.8  --  3.9  --    Estimated Creatinine Clearance: 26.3 mL/min (by C-G formula based on Cr of 1.99).   LIVER  Recent Labs Lab 04/27/15 0329 04/30/15 0500 05/02/15 0400  ALBUMIN  --   --  1.2*  INR 1.77* 1.29  --      INFECTIOUS  Recent Labs Lab 04/25/15 1055 04/26/15 0520  04/27/15 0330 04/27/15 0715 04/30/15 0500 05/01/15 1345  LATICACIDVEN   --   --   < > 2.9* 2.4* 2.3* 2.3*  PROCALCITON 1.82 1.00  --  0.88  --   --   --   < > = values in this interval not displayed.   ENDOCRINE CBG (last 3)   Recent Labs  05/01/15 2104 05/02/15 0026 05/02/15 0750  GLUCAP 170* 152* 156*         IMAGING x48h  - image(s) personally visualized  -   highlighted in bold Dg Chest Port 1 View  05/02/2015  CLINICAL DATA:  Chest tube. EXAM: PORTABLE CHEST 1 VIEW COMPARISON:  05/01/2015. FINDINGS: Endotracheal tube, left IJ line, NG tube in stable position. Left chest tube in stable position. Tiny left apical pneumothorax cannot be  excluded. Right lower lobe infiltrate with right pleural effusion again noted. No interim change. IMPRESSION: 1. Left chest tube in stable position. Tiny left apical pneumothorax cannot be excluded. 2. Remaining lines and tubes in stable position. 3. Persistent right lower lobe infiltrate and right pleural effusion. No interim change. Critical Value/emergent results were called by telephone at the time of interpretation on 05/02/2015 at 7:36 am to nurse Baxter Flattery, who verbally acknowledged these results. Electronically Signed   By: Marcello Moores  Register   On: 05/02/2015 07:38   Dg Chest Port 1 View  05/01/2015  CLINICAL DATA:  Respiratory failure. EXAM: PORTABLE CHEST 1 VIEW COMPARISON:  04/29/2015. FINDINGS: Endotracheal tube, NG tube, left chest tube in stable position. Heart size stable. Persistent right lower lobe infiltrate and right pleural effusion. No pneumothorax. Surgical clips left axilla. IMPRESSION: 1. Lines and tubes in stable position.  No pneumothorax. 2. Persistent right lower lobe infiltrate with right pleural effusion. No interim change. Electronically Signed   By: Marcello Moores  Register   On: 05/01/2015 07:25       ASSESSMENT / PLAN:  PULMONARY A: Acute hypoxic respiratory failure. Rt lung nodule. Pleural effusions - thora 9/26 with negative cultures ETT 10/2>>>10/6>>>10/7>>>   - failure to wean (failed  04/30/15) ; currently due to decondituioning  P:   Daily SBT - needs trach/LTAC if family/patient  decides to pursue full code Continue full vent support Will need a biopsy of the RLL opacity v followup CT chest  at some point when stable for work up of malignancy. Oxygen as needed to support saturations > 92%  CARDIOVASCULAR Lt IJ CVL 9/27>>>  A:  Pericardial effusion with tamponade s/p window 9/27 with resultant chest tube. Nondiagnostic bx Hx of diastolic CHF, CAD, HLD. NSVT and torsades - none since 10/3 On hydrocort since 04/27/15 plasma cortisol of 0.6, definitely adrenally insufficient.   - off pressors since 04/29/15 AM  P:  Monitor hemodynamics. Chest tube management per CVTS  RENAL A:   AKI.  - getting worse despite lasix test dose 04/30/15 and fluid  And vanc dc 05/01/15 . Seen by renal 05/01/15 and fluids ordered   P:   Hold ancmycin Monitor renal fx. Replace electrolytes as needed. BMET in AM. Avoid nephrotix drugs   GASTROINTESTINAL A:   Nutrition.  - tolerating tube feeds  P:   Tube feeds SLP following. PPI - home medication.  HEMATOLOGIC A:   Hx of splenectomy, MGUS, breast cancer. Thrombocytopenia - may be secondary to stress, sepsis   - nil acute  P:  Will try to reach Dr Olevia Perches for diagnosis/prognosis Trend CBC  Monitor for bleeding  SCD's for DVT prophylaxis   INFECTIOUS Blood 10/02 >> NTD Urine 10/02 >> NTD Sputum 10/02 >> normal flora  Blood 10/8>>> Sputum 10/8>>>   A:   HCAP - possible . No clear evidence of sepsis P:   Zosyn 10/8>>> (aim to stop 1010/15/16) Vanc start date unclear to me>>> 05/01/2015 (MRSA PCR negative, culture negative and rising creat)   ENDOCRINE A:   Adrenal insufficiency. 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  negative malignant work up  thus far Ongoing skin lesions + - Bx - NO VASCULITIS but has SMALL and MID VESSEL THROMBOSISI -  - ddx TTP-HUS, crygolobulinemia, lupus anticoag, drugs   P: Get duplex LE/UE DC EVISTA breast cancer prophyalxis - has black box warning for dvt Repeat cryoglobulin Chagne hydrocort to high dose steroids    NEUROLOGIC A:   Acute encephalopathy   - resolved since 04/30/15  P:   Prn fent and versed Monitor mental status Hold lexapro   ONCOLOGY A: No evidence of ongoing current malignancy . There is only hx of remote breast cancer > 20 years ago - see Dr Marin Olp notes 33/00/76  P  - uncertain prognosis. No terminal dx found  Overall  - imprioving but very decodnitioned. Etiology unclear but seems to have a complex problem. PEr Dr Marin Olp 05/01/15 notes- no terminal diagnosis found. Currently has look of chronic critical illness. Prognosis uncertain. She seems to indicate desire for recovery even if slow process (as nodded to CCM MD 05/01/15 and Pall care 05/01/15).    FAMILY:  Pall care meeting 04/21/15 in progress. Repeat meeting 05/02/15. Likely will h ead to ltac/trach   The patient is critically ill with multiple organ systems failure and requires high complexity decision making for assessment and support, frequent evaluation and titration of therapies, application of advanced monitoring technologies and extensive interpretation of multiple databases.   Critical Care Time devoted to patient care services described in this note is  45 Minutes. This time reflects time of care of this signee Dr Brand Males. This critical care time does not reflect procedure time, or teaching time or supervisory time of PA/NP/Med student/Med Resident etc but could involve care discussion time     Dr. Brand Males, M.D., [facility].C.P Pulmonary and Critical Care Medicine Staff Physician [place] [facility] Pager: [phone], If no answer or between  15:00h - 7:00h: call [phone]  05/02/2015 10:26 AM

## 2015-05-02 NOTE — Progress Notes (Signed)
Nutrition Follow-up  DOCUMENTATION CODES:   Severe malnutrition in context of acute illness/injury  INTERVENTION:  - Continue Vital AF 1.2 @ 65 mL/hr which provides 1872 kcal (93% estimated needs), 117 grams protein, and 1265 mL free water. Free water flush per MD.  -Liquid MVI daily via tube -RD will continue to monitor for needs.   NUTRITION DIAGNOSIS:   Inadequate oral intake related to inability to eat as evidenced by NPO status.  Ongoing.  GOAL:   Patient will meet greater than or equal to 90% of their needs  Met.  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 She was admitted on 9/25 for increased fever, cough and congestion coming from her skilled nursing facility and admitted for sepsis secondary to pneumonia causing acute respiratory failure.  Patient's weight since admission has increased from 134 lbs to 179 lbs, an increase of 45 lbs. Patient has moderate, +4 pitting edema and IV fluids which is more than likely attributing to weight gain.  Per chart, patient has been experiencing frequent chronic watery stools. Renal has started following patient since AKI.   Labs: low K, high BUN, high Cr, high CBG, high Na, high plasma lactic acid  Medications: folic acid, Protonix  Patient is currently intubated on ventilator support . MV: 9.2 L/min Temp (24hrs), Avg:98.4 F (36.9 C), Min:98.2 F (36.8 C), Max:98.8 F (37.1 C)  No propofol.   CCM note reviewed.  Possible trach placement.  Prognosis uncertain.  Diet Order:   NPO  Skin:  Wound (see comment)  (stage II pressure ulcer (sacrum, buttocks, elbow); heel)  Last BM:  05/02/2015  Height:   Ht Readings from Last 1 Encounters:  04/21/15 5' 8.5" (1.74 m)    Weight:   Wt Readings from Last 1 Encounters:  05/02/15 179 lb 3.7 oz (81.3 kg)    Ideal Body Weight:  63.64 kg  BMI:  Body mass index is 26.85 kg/(m^2).  Estimated Nutritional Needs:   Kcal:  2011  Protein:  92-115 grams protein  Fluid:  per MD  EDUCATION NEEDS:   No education needs identified at this time  Kayleen Memos, Dietetic Intern 05/02/2015 12:10 PM  I agree with the Student-Dietitian note and made appropriate revisions.  Arthur Holms, RD, LDN Pager #: [phone] After-Hours Pager #: [phone]

## 2015-05-02 NOTE — Progress Notes (Signed)
eLink Physician-Brief Progress Note Patient Name: [name] [name] DOB: 10-22-1934 MRN: [id number]   Date of Service  05/02/2015  HPI/Events of Note  Low K with ARF.   eICU Interventions  Replaced with 20 Meq of Kcl.      Intervention Category Intermediate Interventions: Electrolyte abnormality - evaluation and management  Trenton Passow 05/02/2015, 5:22 AM

## 2015-05-02 NOTE — Progress Notes (Signed)
Daily Progress Note   Patient Name: [name] [name]       Date: 05/02/2015 DOB: 1935/06/27  Age: 80 y.o. 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 She was then readmitted on 04/15/2015 with HCAP and found to have cardiac tamponade s/p pericardial window on 04/01/2015. 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 Off of pressors since 04/30/15.   Interval Events: I met with [name] [name] alone today at the request of her family. They are concerned that their presence influences her decision-making and want her to feel free to express her desires for her care. On my arrival, she was resting comfortably in bed.  She remains nonverbal to due ET tube but is able to communicate to yes or no questions through nodding her head.    She endorsed remembering our conversation yesterday, and she indicated that she had understood our conversation.  She denied having any pain, SOB, or nausea today.  She nodded understanding that she remains very ill.  I spoke with her again about options moving forward, including  continued aggressive care, the possibility of trach and PEG with long-term stay at [facility], and the possibility of refocusing care with her comfort as the priority.  During these conversations, she continued to  avoid eye contact and stared the other side of the room. She was unwilling to give any other indication of her thoughts on plan moving forward.  I asked her if she thought it would be helpful to have her family come back for another discussion.  She nodded that it would.  I called and spoke with her daughter, [name] [name] to let her know about our conversation.  She will talk to her brother, [name] [name], and they will plan on meeting with me tomorrow at 5:30 PM.  When speaking with [name] [name], it seemed as though she and [name] [name] were leaning toward option for trach and PEG with eventual transfer to LTAC.  She stated that they would need to ensure that her mother understands that while there is a chance that she may continue to improve, she also needs to understand that this would be a plan to go to [facility] for extended period of time and she may never get well enough to be back home again.  I told [name] [name] that we need to come up with a plan tomorrow so that we know what our path forward looks like. She was in agreement that we need to figure out her mother's wishes so we can proceed in the best manner to honor them.     Length of Stay: 18 days  Current Medications: Scheduled Meds:  . antiseptic oral rinse  7 mL Mouth Rinse QID  . chlorhexidine gluconate  15 mL Mouth Rinse BID  . folic acid  2 mg Per Tube Daily  . free water  200 mL Per Tube 3 times per day  . Gerhardt's butt cream   Topical Daily  . insulin aspart  0-9 Units Subcutaneous  6 times per day  . methylPREDNISolone (SOLU-MEDROL) injection  125 mg Intravenous Q6H  . multivitamin  5 mL Oral Daily  . pantoprazole sodium  40 mg Per Tube Daily  . piperacillin-tazobactam (ZOSYN)  IV  3.375 g Intravenous Q8H  . sodium chloride  1,000 mL Intravenous Once  . sodium chloride  10-40 mL Intracatheter Q12H    Continuous Infusions: . sodium chloride 100 mL/hr at 05/02/15 1800  . sodium chloride 10 mL/hr at 05/02/15 1800  . feeding supplement (VITAL AF 1.2 CAL) 1,000  mL (05/02/15 1800)    PRN Meds: fentaNYL (SUBLIMAZE) injection, levalbuterol, midazolam, midazolam, sodium chloride  Palliative Performance Scale: 10%     Vital Signs: BP 134/41 mmHg  Pulse 101  Temp(Src) 98.8 F (37.1 C) (Oral)  Resp 28  Ht 5' 8.5" (1.74 m)  Wt 81.3 kg (179 lb 3.7 oz)  BMI 26.85 kg/m2  SpO2 100% SpO2: SpO2: 100 % O2 Device: O2 Device: Ventilator O2 Flow Rate: O2 Flow Rate (L/min): 3 L/min  Intake/output summary:  Intake/Output Summary (Last 24 hours) at 05/02/15 1828 Last data filed at 05/02/15 1800  Gross per 24 hour  Intake 3830.83 ml  Output   2365 ml  Net 1465.83 ml   LBM:   Baseline Weight: Weight: 66.6 kg (146 lb 13.2 oz) Most recent weight: Weight: 81.3 kg (179 lb 3.7 oz)  Physical Exam: General appearance: cachectic and intubated Head: Normocephalic, without obvious abnormality, atraumatic Eyes: conjunctivae and sclerae normal  Resp: rhonchi bilaterally Cardio: Regular, no murmur GI: soft, non-tender; bowel sounds normal; no masses, no organomegaly Extremities: covered in diffuse erythemetous rash with yellowish bullae involving the lower extremities and toes as well as chest and arms                Additional Data Reviewed: Recent Labs     05/01/15  0400   05/02/15  0359  05/02/15  0400  05/02/15  0753  WBC  7.5   --   10.8*   --    --   HGB  7.9*   --   8.4*   --    --   PLT  110*   --   127*   --    --   NA  141   < >   --   147*  148*  BUN  64*   < >   --   74*  76*  CREATININE  1.99*   < >   --   1.92*  1.99*   < > = values in this interval not displayed.     Problem List:  Patient Active Problem List   Diagnosis Date Noted  . AKI (acute kidney injury) ([place]) 05/01/2015  . Paroxysmal ventricular tachycardia ([place]) 04/22/2015  . Altered mental status 04/21/2015  . Acute pulmonary edema ([place]) 04/21/2015  . Sinusitis, acute maxillary 04/21/2015  . Septic shock ([place])   . Chest tube in place   . Endotracheally  intubated   . Pleural effusion   . Lung nodules   . Pleural effusion on right   . Pericardial tamponade 04/15/2015  . HCAP (healthcare-associated pneumonia) 03/25/2015  . Sepsis due to pneumonia ([place]) 03/31/2015  . Acute respiratory failure with hypoxia ([place]) 03/25/2015  . Hypokalemia 03/23/2015  . Pericardial effusion 03/26/2015  . Pressure ulcer 04/18/2015  . S/P laparoscopic splenectomy 04/05/2015  . Generalized weakness 03/30/2015  . HLD (hyperlipidemia) 03/30/2015  . Malnutrition of moderate degree ([place]) 03/18/2015  .  Chronic diastolic heart failure ([place])   . Lung nodule 03/14/2015  . Adrenal nodule ([place]) 03/14/2015  . MGUS (monoclonal gammopathy of unknown significance) 03/16/2013     Palliative Care Assessment & Plan    Code Status:  Full code  Goals of Care:  I met with Ms. Durnell alone as her family thought that their presence may be influencing her decision-making.  Unfortunately, she was unable emotionally to engage with me today. During any talk of plan moving forward, she looked the other direction, avoided eye contact, and did not answer questions.  I spoke with her daughter, [name] [name]. Based upon her conversation with her mother yesterday, she and her brother her thinking that their mother would want to pursue trach and PEG with long-term placement in LTAC. She does state concerns that she is unsure if her mother truly understands this. She reports understanding that the path forward would be a very long recovery.  I also discussed with her that if they pursue trach and PEG with placement in LTAC, I would recommend that they reassess her situation in a short period of time, such as 2 months to assess if she is improving.  Symptom Management:  She denies symptoms  Prognosis: Unable to determine Discharge Planning: To be determined.   Care plan was discussed with patient and her daughter.  Thank you for allowing the Palliative Medicine Team to assist in the care  of this patient.   Time In:  1530 Time Out: 1610 Total Time 40 Prolonged Time Billed no    Greater than 50%  of this time was spent counseling and coordinating care related to the above assessment and plan.   Micheline Rough, MD  05/02/2015, 6:28 PM  Please contact Palliative Medicine Team phone at [phone] for questions and concerns.

## 2015-05-03 ENCOUNTER — Ambulatory Visit: Payer: Medicare Other

## 2015-05-03 ENCOUNTER — Inpatient Hospital Stay (HOSPITAL_COMMUNITY): Payer: Medicare Other

## 2015-05-03 DIAGNOSIS — I999 Unspecified disorder of circulatory system: Secondary | ICD-10-CM

## 2015-05-03 DIAGNOSIS — R234 Changes in skin texture: Secondary | ICD-10-CM

## 2015-05-03 LAB — KAPPA/LAMBDA LIGHT CHAINS
KAPPA, LAMDA LIGHT CHAIN RATIO: 31.14 — AB (ref 0.26–1.65)
Kappa free light chain: 438.21 mg/L — ABNORMAL HIGH (ref 3.30–19.40)
LAMDA FREE LIGHT CHAINS: 14.07 mg/L (ref 5.71–26.30)

## 2015-05-03 LAB — COMPREHENSIVE METABOLIC PANEL
ALT: 107 U/L — ABNORMAL HIGH (ref 14–54)
AST: 73 U/L — ABNORMAL HIGH (ref 15–41)
Albumin: 1.3 g/dL — ABNORMAL LOW (ref 3.5–5.0)
Alkaline Phosphatase: 147 U/L — ABNORMAL HIGH (ref 38–126)
Anion gap: 9 (ref 5–15)
BUN: 79 mg/dL — ABNORMAL HIGH (ref 6–20)
CHLORIDE: 111 mmol/L (ref 101–111)
CO2: 21 mmol/L — ABNORMAL LOW (ref 22–32)
Calcium: 7.3 mg/dL — ABNORMAL LOW (ref 8.9–10.3)
Creatinine, Ser: 1.76 mg/dL — ABNORMAL HIGH (ref 0.44–1.00)
GFR, EST AFRICAN AMERICAN: 31 mL/min — AB (ref >60)
GFR, EST NON AFRICAN AMERICAN: 27 mL/min — AB (ref >60)
Glucose, Bld: 186 mg/dL — ABNORMAL HIGH (ref 65–99)
POTASSIUM: 3 mmol/L — AB (ref 3.5–5.1)
SODIUM: 141 mmol/L (ref 135–145)
Total Bilirubin: 0.7 mg/dL (ref 0.3–1.2)
Total Protein: 3.4 g/dL — ABNORMAL LOW (ref 6.5–8.1)

## 2015-05-03 LAB — CBC WITH DIFFERENTIAL/PLATELET
BAND NEUTROPHILS: 1 %
BASOS ABS: 0 10*3/uL (ref 0.0–0.1)
BASOS PCT: 0 %
EOS ABS: 0 10*3/uL (ref 0.0–0.7)
Eosinophils Relative: 0 %
HCT: 24.5 % — ABNORMAL LOW (ref 36.0–46.0)
HEMOGLOBIN: 8.1 g/dL — AB (ref 12.0–15.0)
LYMPHS ABS: 0.3 10*3/uL — AB (ref 0.7–4.0)
Lymphocytes Relative: 2 %
MCH: 30 pg (ref 26.0–34.0)
MCHC: 33.1 g/dL (ref 30.0–36.0)
MCV: 90.7 fL (ref 78.0–100.0)
MONO ABS: 0 10*3/uL — AB (ref 0.1–1.0)
Monocytes Relative: 0 %
NRBC: 5 /100{WBCs} — AB
Neutro Abs: 12.5 10*3/uL — ABNORMAL HIGH (ref 1.7–7.7)
Neutrophils Relative %: 97 %
Platelets: 156 10*3/uL (ref 150–400)
RBC: 2.7 MIL/uL — ABNORMAL LOW (ref 3.87–5.11)
RDW: 18.6 % — AB (ref 11.5–15.5)
WBC: 12.8 10*3/uL — ABNORMAL HIGH (ref 4.0–10.5)

## 2015-05-03 LAB — PROTEIN ELECTROPHORESIS, SERUM
A/G RATIO SPE: 0.8 (ref 0.7–1.7)
ALBUMIN ELP: 1.5 g/dL — AB (ref 2.9–4.4)
ALPHA-1-GLOBULIN: 0.3 g/dL (ref 0.0–0.4)
ALPHA-2-GLOBULIN: 0.6 g/dL (ref 0.4–1.0)
Beta Globulin: 0.6 g/dL — ABNORMAL LOW (ref 0.7–1.3)
GLOBULIN, TOTAL: 1.9 g/dL — AB (ref 2.2–3.9)
Gamma Globulin: 0.4 g/dL (ref 0.4–1.8)
M-Spike, %: 0.2 g/dL — ABNORMAL HIGH
TOTAL PROTEIN ELP: 3.4 g/dL — AB (ref 6.0–8.5)

## 2015-05-03 LAB — CULTURE, BLOOD (ROUTINE X 2): Culture: NO GROWTH

## 2015-05-03 LAB — GLUCOSE, CAPILLARY
GLUCOSE-CAPILLARY: 128 mg/dL — AB (ref 65–99)
GLUCOSE-CAPILLARY: 135 mg/dL — AB (ref 65–99)
GLUCOSE-CAPILLARY: 147 mg/dL — AB (ref 65–99)
GLUCOSE-CAPILLARY: 161 mg/dL — AB (ref 65–99)
GLUCOSE-CAPILLARY: 167 mg/dL — AB (ref 65–99)
Glucose-Capillary: 167 mg/dL — ABNORMAL HIGH (ref 65–99)

## 2015-05-03 LAB — CK TOTAL AND CKMB (NOT AT ARMC)
CK TOTAL: 30 U/L — AB (ref 38–234)
CK, MB: 4 ng/mL (ref 0.5–5.0)
RELATIVE INDEX: INVALID (ref 0.0–2.5)

## 2015-05-03 LAB — IGG, IGA, IGM
IGA: 63 mg/dL — AB (ref 64–422)
IGA: 66 mg/dL (ref 64–422)
IGG (IMMUNOGLOBIN G), SERUM: 215 mg/dL — AB (ref 700–1600)
IGG (IMMUNOGLOBIN G), SERUM: 211 mg/dL — AB (ref 700–1600)
IGM, SERUM: 306 mg/dL — AB (ref 26–217)
IgM, Serum: 295 mg/dL — ABNORMAL HIGH (ref 26–217)

## 2015-05-03 LAB — RETICULOCYTES
RBC.: 2.6 MIL/uL — ABNORMAL LOW (ref 3.87–5.11)
Retic Count, Absolute: 127.4 10*3/uL (ref 19.0–186.0)
Retic Ct Pct: 4.9 % — ABNORMAL HIGH (ref 0.4–3.1)

## 2015-05-03 LAB — PHOSPHORUS: PHOSPHORUS: 3.8 mg/dL (ref 2.5–4.6)

## 2015-05-03 LAB — LACTATE DEHYDROGENASE: LDH: 302 U/L — ABNORMAL HIGH (ref 98–192)

## 2015-05-03 LAB — HEPARIN LEVEL (UNFRACTIONATED): HEPARIN UNFRACTIONATED: 0.73 [IU]/mL — AB (ref 0.30–0.70)

## 2015-05-03 LAB — MAGNESIUM: MAGNESIUM: 1.9 mg/dL (ref 1.7–2.4)

## 2015-05-03 LAB — LACTIC ACID, PLASMA: Lactic Acid, Venous: 2.5 mmol/L (ref 0.5–2.0)

## 2015-05-03 LAB — PROTIME-INR
INR: 1.69 — ABNORMAL HIGH (ref 0.00–1.49)
Prothrombin Time: 19.9 seconds — ABNORMAL HIGH (ref 11.6–15.2)

## 2015-05-03 LAB — FIBRINOGEN: Fibrinogen: 153 mg/dL — ABNORMAL LOW (ref 204–475)

## 2015-05-03 LAB — D-DIMER, QUANTITATIVE: D-Dimer, Quant: 6.44 ug/mL-FEU — ABNORMAL HIGH (ref 0.00–0.48)

## 2015-05-03 MED ORDER — POTASSIUM CHLORIDE 20 MEQ/15ML (10%) PO SOLN
ORAL | Status: AC
  Filled 2015-05-03: qty 15

## 2015-05-03 MED ORDER — MAGNESIUM SULFATE 2 GM/50ML IV SOLN
2.0000 g | Freq: Once | INTRAVENOUS | Status: AC
  Administered 2015-05-03: 2 g via INTRAVENOUS
  Filled 2015-05-03: qty 50

## 2015-05-03 MED ORDER — POTASSIUM CHLORIDE 10 MEQ/50ML IV SOLN
10.0000 meq | INTRAVENOUS | Status: DC | PRN
  Administered 2015-05-03 (×2): 10 meq via INTRAVENOUS

## 2015-05-03 MED ORDER — HEPARIN (PORCINE) IN NACL 100-0.45 UNIT/ML-% IJ SOLN
800.0000 [IU]/h | INTRAMUSCULAR | Status: DC
  Administered 2015-05-03: 1250 [IU]/h via INTRAVENOUS
  Administered 2015-05-04: 1150 [IU]/h via INTRAVENOUS
  Filled 2015-05-03 (×3): qty 250

## 2015-05-03 MED ORDER — POTASSIUM CHLORIDE 20 MEQ/15ML (10%) PO SOLN
40.0000 meq | Freq: Once | ORAL | Status: AC
  Administered 2015-05-03: 40 meq
  Filled 2015-05-03 (×2): qty 30

## 2015-05-03 NOTE — Progress Notes (Signed)
I was finally able to get hold of the pathology report. I am incredibly fascinated by this. 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  She clearly has purpura. It almost looks as if she has purpura fulminans which would be unusual. This typically we see with severe sepsis. The fact that she has had her spleen taken out clearly increase her risk of infection but so far, all cultures have been negative.  The skin lesions we can sometimes see with "Coumadin necrosis". She has not been on warfarin. As far as I know, she does not have any issues with protein C deficiency.  The skin lesions are negative for IgA so this would not be Henoch- Schoenlein purpura.   I cannot think of any drug that she has been on that would cause this.  Again, we've not found any obvious malignancy.   I'm just absolutely puzzled as to why she is developed all this.  Is as if she is hypercoagulable. She is not bleeding.  I think it might be worthwhile getting her on heparin. I think this would be reasonable.  I'm sending off some studies, including protein C levels.  She does appear to be alert. She is still intubated. She still has the chest tube in. She had urine cytology sent off which was negative for any malignancy.  She has not had any obvious thrombotic issues with major blood vessels. She has had no problems with cyanosis.  Her labs are still somewhat incomplete. Her hemoglobin is 8.1.  She does have an elevated d-dimer of 6.44. Her pro time is slowly elevated at an INR of 1.69.  I suppose this might be similar to DIC but she really has not had any obvious etiology for DIC.  She has a low-grade temperature. She is not hypotensive. Her blood pressure is 120/46.  She has these purpuric regions. She has areas of bulla.  Again, I must say that this is certainly very puzzling. I think the path report on the skin biopsy is somewhat helpful.  Given that this is some  type of thrombophilic condition that we are dealing with, I don't see a problem with putting her on heparin.  I  appreciate all the great care that she is getting from everybody in the ICU.  This is truly an incredibly complicated case. What is amazing is that 2 months ago, she was really doing well and then has just had one problem after another. Despite multiple biopsies, we've yet to identify any obvious malignancy. She had her spleen taken out which was not definitive for any malignancy. She had a pericardial biopsy and pericardial fluid that was evaluated and was negative for malignancy. Her tumor markers are also negative.  I am praying real hard for the wisdom to try to get her better.  Frederich Cha 1:5

## 2015-05-03 NOTE — Progress Notes (Signed)
PULMONARY / CRITICAL CARE MEDICINE   Name: [name] [name] MRN: [id number] DOB: 1935/07/06    ADMISSION DATE:  04/06/2015  LOS 19 days   REFERRING MD :  [facility]  CHIEF COMPLAINT:  Cough  INITIAL PRESENTATION:  80 yo admitted with cough and fever from sepsis and HCAP.  She was found to have cardiac tamponade.  She has hx of breast cancer with possible metastatic disease, MGUS.  STUDIES/EVENT:  02/20/15 - bone marrow - predom CD8 cells 04/03/15 - Left adrenal bx  - extensive necrosis  04/05/15 - splenectomy bx -  ...Marland KitchenMarland KitchenMarland Kitchen 09/25  Admit, oncology consult 9/26  Thoracentesis  9/26  CT chest >> b/l pleural effusions, large pericardial effusion, 2.3 cm RLL nodule (was 1.8 cm) 9/26  ECHO >> tamponade  09/26  Cardiology consulted 09/27  TCTS consulted >> subxyphoid pericardial window. PERICARDIAL BIOPSY - NO EVIDENCE OF MALIGNANCY 09/28  Extubated 10/116 - echo - nromal EF 10/02  Fever, altered mental status >>To ICU. Intubated 10/03  NSVT and torsades.  10/6  MRI Head >> negative for acute pathology  10/06  Extubated  10/07  Reintubated for hypoxemia and AMS. AUTOIMMUNE NEGATIVE (ds dna, ana, rF, ANCA, Ig, ssa, ssB, scl70) 04/28/15: : Reintubated overnight, mottled overnight with high pressor demand. 04/29/15: Off levophed. RN very concerned abotu extensive dermal necrosis all over body. Overall very weak. Follows commands. Looks very decondtioned. On fent gtt 04/30/15 - LEFT ANTERIOR THIGH BX -> 05/03/2015 - off sedation gtt x 24h. Off pressors x 24h. Following commands. SBT just started. RN denies overnight issues other than general weakness and persistence of derm lesions. Tolerating tube feeds. However, creat rising to 1.9   05/01/15 - no clear evidence of malignancy per Dr Marin Olp notes. Did SBT yesterday but failed. SBT starting now. Palliative care today at 56/21 per Elmo Putt of pallitive. PEr rN no overnight events. Patient nodding understanding to this MD that she knows she is ill and is ok  with slow recovery.   05/02/15: derm bx - prelim d./w DR Caryl Ada  thrombotic changes in vessels. No evidence of vasculitys. 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 DIF negative - so Non IgA. Not leukocytoclastvic vasculitis. Ddx -  Lupus anticoag, Cyroglobulinemia,  HUS-TTP, Early DIC (doubt due to lack of epidremal necrosis). No evidence of vasculitis . No evidence of Wegners. No neutrophils seen. Dr Tamala Julian feel patient is necrosing but not sure why. Per renal note 05/02/15 : "Low complements on 05/01/15 and unable to perform cryoglobulin due to the fact her blood would not separate in centrifuge"   Pall care meet yeserday 05/01/15: full code + likley LTAC/trach +ve but patient/family overwhelmed but will be re-meeting with pall. Care today. No other change. Creat worsening - renal following and Rx fluids. HYDROCORT CHANGED TO SOLUMEDROL   SUBJECTIVE/OVERNIGHT/INTERVAL HX 05/03/2015 : creat better with fluids per renal and ch ange to solumedrol . Repeat palliative meet later today. Lactate up at 2.5 but CK normal. Did SBT 8h yesterday  VITAL SIGNS: Temp:  [98.3 F (36.8 C)-99.4 F (37.4 C)] 99.2 F (37.3 C) (10/14 0700) Pulse Rate:  [81-103] 92 (10/14 0700) Resp:  [16-34] 23 (10/14 0700) BP: (117-146)/(40-59) 117/56 mmHg (10/14 0700) SpO2:  [100 %] 100 % (10/14 0700) FiO2 (%):  [40 %] 40 % (10/14 0810)   HEMODYNAMICS: CVP:  [5 mmHg] 5 mmHg   VENTILATOR SETTINGS: Vent Mode:  [-] PRVC FiO2 (%):  [40 %] 40 % Set Rate:  [22 bmp] 22 bmp Vt  Set:  [390 mL] 390 mL PEEP:  [5 cmH20] 5 cmH20 Pressure Support:  [10 cmH20] 10 cmH20 Plateau Pressure:  [12 cmH20-15 cmH20] 13 cmH20   INTAKE / OUTPUT:  Intake/Output Summary (Last 24 hours) at 05/03/15 8250 Last data filed at 05/03/15 0700  Gross per 24 hour  Intake 4963.33 ml  Output   1785 ml  Net 3178.33 ml   PHYSICAL EXAMINATION: General: chronically crtiically ill appearing female  Neuro:  RASS 0 ot -1, .  Deconditioned. Does not appear delirious. Follows commands Cardiovascular:  S1, S2, No MRG. Lungs:  Even/non-labored, coarse BS diffusely. Abdomen:  Soft, non tender, BSx4 active  Musculoskeletal:  No acute deformities Skin:  Extensive necrosis like lesions across forearm, thigh and even nose - unchanged since 05/02/15   LABS:  PULMONARY  Recent Labs Lab 04/27/15 0913 04/27/15 2105 04/27/15 2116 04/27/15 2240 04/28/15 1520 04/29/15 0405  PHART 7.411  --  7.222* TEST WILL BE CREDITED 7.332* 7.330*  PCO2ART 37.8  --  45.6* TEST WILL BE CREDITED 38.8 39.6  PO2ART 39.0*  --  47.0* TEST WILL BE CREDITED 229.0* 194*  HCO3 23.5 18.4* 18.7* TEST WILL BE CREDITED 20.6 20.3  TCO2 25 20 20  TEST WILL BE CREDITED 22 21.5  O2SAT 67.0 50.0 74.0 TEST WILL BE CREDITED 100.0 99.2    CBC  Recent Labs Lab 05/01/15 0400 05/02/15 0359 05/03/15 0447  HGB 7.9* 8.4* 8.1*  HCT 24.2* 25.7* 24.5*  WBC 7.5 10.8* PENDING  PLT 110* 127* PENDING    COAGULATION  Recent Labs Lab 04/27/15 0329 04/30/15 0500  INR 1.77* 1.29    CARDIAC  No results for input(s): TROPONINI in the last 168 hours. No results for input(s): PROBNP in the last 168 hours.   CHEMISTRY  Recent Labs Lab 04/29/15 0458 04/30/15 0500 05/01/15 0400 05/01/15 1345 05/02/15 0400 05/02/15 0753 05/02/15 2100 05/03/15 0447  NA 138 138 141 140 147* 148* 141 141  K 3.7 3.2* 2.8* 2.9* 3.0* 2.9* 3.0* 3.0*  CL 108 108 108 109 111 111 108 111  CO2 20* 21* 22 22 22 23 22  21*  GLUCOSE 130* 148* 130* 150* 155* 171* 179* 186*  BUN 50* 59* 64* 65* 74* 76* 78* 79*  CREATININE 1.79* 1.96* 1.99* 1.98* 1.92* 1.99* 1.89* 1.76*  CALCIUM 7.5* 7.3* 7.5* 7.4* 7.9* 8.0* 7.4* 7.3*  MG 2.2 2.2 2.0  --  2.0  --   --  1.9  PHOS 4.9* 4.2 3.8  --  3.9  --   --  3.8   Estimated Creatinine Clearance: 29.8 mL/min (by C-G formula based on Cr of 1.76).   LIVER  Recent Labs Lab 04/27/15 0329 04/30/15 0500 05/02/15 0400 05/03/15 0447   AST  --   --   --  73*  ALT  --   --   --  107*  ALKPHOS  --   --   --  147*  BILITOT  --   --   --  0.7  PROT  --   --   --  3.4*  ALBUMIN  --   --  1.2* 1.3*  INR 1.77* 1.29  --   --      INFECTIOUS  Recent Labs Lab 04/27/15 0330  04/30/15 0500 05/01/15 1345 05/03/15 0430  LATICACIDVEN 2.9*  < > 2.3* 2.3* 2.5*  PROCALCITON 0.88  --   --   --   --   < > = values in this interval not displayed.  ENDOCRINE CBG (last 3)   Recent Labs  05/02/15 2036 05/03/15 0032 05/03/15 0425  GLUCAP 161* 161* 167*         IMAGING x48h  - image(s) personally visualized  -   highlighted in bold Dg Chest Port 1 View  05/03/2015  CLINICAL DATA:  Hypoxia.  Recent pericardial window creation. EXAM: PORTABLE CHEST 1 VIEW COMPARISON:  May 02, 2015 FINDINGS: Endotracheal tube tip is 4.3 cm above the carina. Central catheter tip is in the superior vena cava. Nasogastric tube tip and side port are below the diaphragm. Left chest tube is in position. Currently no pneumothorax is appreciable. There is a right pleural effusion with right base consolidation. Left lung is clear. Heart size is within normal limits. Pulmonary vascularity within normal limits. No adenopathy. There are surgical clips in left axillary region. IMPRESSION: Tube and catheter positions as described without apparent pneumothorax. Moderate right effusion, layering, with right base consolidation, stable. Left lung clear. No change in cardiac silhouette. Electronically Signed   By: Lowella Grip III M.D.   On: 05/03/2015 07:32   Dg Chest Port 1 View  05/02/2015  CLINICAL DATA:  Chest tube. EXAM: PORTABLE CHEST 1 VIEW COMPARISON:  05/01/2015. FINDINGS: Endotracheal tube, left IJ line, NG tube in stable position. Left chest tube in stable position. Tiny left apical pneumothorax cannot be excluded. Right lower lobe infiltrate with right pleural effusion again noted. No interim change. IMPRESSION: 1. Left chest tube in stable  position. Tiny left apical pneumothorax cannot be excluded. 2. Remaining lines and tubes in stable position. 3. Persistent right lower lobe infiltrate and right pleural effusion. No interim change. Critical Value/emergent results were called by telephone at the time of interpretation on 05/02/2015 at 7:36 am to nurse Baxter Flattery, who verbally acknowledged these results. Electronically Signed   By: Marcello Moores  Register   On: 05/02/2015 07:38       ASSESSMENT / PLAN:  PULMONARY A: Acute hypoxic respiratory failure. Rt lung nodule. Pleural effusions - thora 9/26 with negative cultures ETT 10/2>>>10/6>>>10/7>>>   - failure to wean 04/30/15 but did 8h 05/02/15; dobut can successfuly extubate decondituioning and volume overload  P:   Daily SBT - needs trach/LTAC if family/patient  decides to pursue full code Continue full vent support Will need a biopsy of the RLL opacity v followup CT chest  at some point when stable for work up of malignancy. Oxygen as needed to support saturations > 92%  CARDIOVASCULAR Lt IJ CVL 9/27>>>  A:  Pericardial effusion with tamponade s/p window 9/27 with resultant chest tube. Nondiagnostic bx Hx of diastolic CHF, CAD, HLD. NSVT and torsades - none since 10/3 On hydrocort since 04/27/15 plasma cortisol of 0.6, definitely adrenally insufficient.   - off pressors since 04/29/15 AM  P:  Monitor hemodynamics. Chest tube management per CVTS  RENAL A:   AKI. - worst 05/02/15  - improving 05/03/2015 after renal consu.t fluids + vacn dc  + change of hydrocort to solumedrol 05/02/15   - having moderate hypokalemia and mild hypomag < 2gm%  P:   Hold vancmycin Monitor renal fx. Replace electrolytes as needed. - replete mag and k 05/03/2015 BMET in AM. Avoid nephrotix drugs   GASTROINTESTINAL A:   Nutrition.  - tolerating tube feeds  P:   Tube feeds SLP following. PPI - home medication.  HEMATOLOGIC A:   Hx of splenectomy, MGUS, breast cancer - was on  EVISTA. Thrombocytopenia - may be secondary to stress, sepsis   -  nil acute  P:  Hold Evista since 05/02/15 due to vasculopathy on skin bx Will try to reach Dr Olevia Perches for diagnosis/prognosis Trend CBC  Monitor for bleeding  SCD's for DVT prophylaxis   INFECTIOUS Blood 10/02 >> NTD Urine 10/02 >> NTD Sputum 10/02 >> normal flora  Blood 10/8>>> Sputum 10/8>>>   A:   HCAP - possible . No clear evidence of sepsis P:   Zosyn 10/8>>> (aim to stop 05/04/15) Vanc start date unclear to me>>> 05/01/2015 (MRSA PCR negative, culture negative and rising creat)   ENDOCRINE A:   Adrenal insufficiency. Necrotic adrenals seen on recent biopsy P:   Hydrocortisone -> chane to Solumedrol 05/02/15 (see autoimmnune)  AUTOIMMUNE ANCA >> NEG DS DNA >> neg SCL70 >> neg RF >> 30 SSA/SSB >> neg IgG low IgA normal Complement 05/01/15 - low Crygolobulin 05/01/15 - could not do because blood would not separate in centrifuge due to low complement Repeat cryoglobumin 05/02/15 D-dimer , fibrinogen, Protein c 05/03/15>>  A: Ongoing skin lesions + 04/30/15 - Bx - NO VASCULITIS but has SMALL and MID VESSEL THROMBOSISI -  - ddx TTP-HUS, crygolobulinemia, lupus anticoag, drugs   P: Await  duplex LE/UE - re-ordered 05/03/15 Hiold EVISTA breast cancer prophyalxis - has black box warning for dvt (on hold since 05/02/15) Await  Cryoglobulin repeat 05/02/15 Await fibrinogen, d-dimer, protein c from 05/03/15 Chagne hydrocort to solumedrol 05/02/15 >>   NEUROLOGIC A:   Acute encephalopathy   - resolved since 04/30/15  P:   Prn fent and versed Monitor mental status Hold lexapro   ONCOLOGY A: No evidence of ongoing current malignancy . There is only hx of remote breast cancer > 20 years ago - see Dr Marin Olp notes 30/09/23  P  - uncertain prognosis. No terminal dx found  Overall  - imprioving but very decodnitioned. Etiology unclear but seems to have a complex problem. PEr Dr Marin Olp  05/01/15 notes- no terminal diagnosis found. Currently has look of chronic critical illness. Prognosis uncertain. She seems to indicate desire for recovery even if slow process (as nodded to CCM MD 05/01/15 and Pall care 05/01/15).    FAMILY:  Pall care meeting 05/01/15 05/02/15 in progress. Repeat meeting 05/02/15 pending,. . Likely will need ltac/trach   The patient is critically ill with multiple organ systems failure and requires high complexity decision making for assessment and support, frequent evaluation and titration of therapies, application of advanced monitoring technologies and extensive interpretation of multiple databases.   Critical Care Time devoted to patient care services described in this note is  35 Minutes. This time reflects time of care of this signee Dr Brand Males. This critical care time does not reflect procedure time, or teaching time or supervisory time of PA/NP/Med student/Med Resident etc but could involve care discussion time    Dr. Brand Males, M.D., [facility].C.P Pulmonary and Critical Care Medicine Staff Physician [place] Pager: [phone], If no answer or between  15:00h - 7:00h: call [phone]  05/03/2015 8:22 AM

## 2015-05-03 NOTE — Progress Notes (Signed)
Dr Domingo Cocking reported to staff that there is no change in the treatment plan.

## 2015-05-03 NOTE — Progress Notes (Signed)
Pts family in meeting with Dr. Domingo Cocking from [facility].

## 2015-05-03 NOTE — Progress Notes (Signed)
Dressing changes to both upper extremities pads changed

## 2015-05-03 NOTE — Progress Notes (Signed)
Spoke with phlebotomy regarding the heparin test that is a lab collect and is due at this time. Labels returned to lab at 101 per lab request. Next shift will be made aware of status of labwork

## 2015-05-03 NOTE — Progress Notes (Signed)
Spoke with lab regarding first stick for heparin

## 2015-05-03 NOTE — Progress Notes (Signed)
ANTICOAGULATION CONSULT NOTE   Pharmacy Consult for Heparin Indication: Hypercoagulable state  No Known Allergies  Patient Measurements: Height: 5' 8.5" (174 cm) Weight: 181 lb 14.1 oz (82.5 kg) IBW/kg (Calculated) : 65.05  Vital Signs: Temp: 100.1 F (37.8 C) (10/14 1937) Temp Source: Oral (10/14 1937) BP: 139/56 mmHg (10/14 1914) Pulse Rate: 104 (10/14 1914)  Labs:  Recent Labs  05/01/15 0400  05/02/15 0359  05/02/15 0753 05/02/15 2100 05/03/15 0447 05/03/15 0830 05/03/15 1953  HGB 7.9*  --  8.4*  --   --   --  8.1*  --   --   HCT 24.2*  --  25.7*  --   --   --  24.5*  --   --   PLT 110*  --  127*  --   --   --  156  --   --   LABPROT  --   --   --   --   --   --   --  19.9*  --   INR  --   --   --   --   --   --   --  1.69*  --   HEPARINUNFRC  --   --   --   --   --   --   --   --  0.73*  CREATININE 1.99*  < >  --   < > 1.99* 1.89* 1.76*  --   --   CKTOTAL  --   --   --   --   --   --  30*  --   --   CKMB  --   --   --   --   --   --  4.0  --   --   < > = values in this interval not displayed.  Estimated Creatinine Clearance: 30 mL/min (by C-G formula based on Cr of 1.76).   Medical History: Past Medical History  Diagnosis Date  . Abnormal WBC count     low, h/o  . Other and unspecified hyperlipidemia   . Unspecified essential hypertension   . OP (osteoporosis)   . Vitamin D deficiency   . MGUS (monoclonal gammopathy of unknown significance) 03/16/2013  . Back pain   . Breast cancer ([place]) 1981    mastectomy and lymph node removal  . DM type 2 (diabetes mellitus, type 2) ([place])     Pt states she is 'prediabetic'    Assessment: 80 year old female to begin heparin for possible hypercoagulable state / thrombotic vasculopathy.  Initial heparin level is just above goal at 0.73. No bleeding issues noted.  Goal of Therapy:  Heparin level 0.3-0.7 units/ml Monitor platelets by anticoagulation protocol: Yes   Plan:  Decrease Heparin drip to 1150 units /  hr Heparin level 8 hours after heparin starts Daily heparin level, CBC  [name] Hearing PharmD., BCPS Clinical Pharmacist Pager [phone] 05/03/2015 8:28 PM

## 2015-05-03 NOTE — Progress Notes (Signed)
Family in with patient and Dr Domingo Cocking discussing options and pallative care

## 2015-05-03 NOTE — Progress Notes (Signed)
*  Preliminary Results* Bilateral upper extremity venous duplex completed. Study was very technically limited due to patient's skin condition and bandaging.  Visualized veins of bilateral upper extremities are negative for deep and superficial vein thrombosis. There is evidence of elevated velocities in the right axillary vein; etiology and clinical significance is unknown.   Bilateral lower extremity venous duplex completed. Study was very technically limited due to patient's skin condition, bandaging, and edema. Unable to visualize any veins of bilateral lower extremities due to technical limitations. Therefore, this test is inconclusive.  05/03/2015 11:21 AM  Maudry Mayhew, RVT, RDCS, RDMS

## 2015-05-03 NOTE — Progress Notes (Signed)
Patient ID: [name] [name], female   DOB: 12-13-1934, 80 y.o.   MRN: [id number] S:no new complaints O:BP 121/60 mmHg  Pulse 70  Temp(Src) 99.2 F (37.3 C) (Axillary)  Resp 23  Ht 5' 8.5" (1.74 m)  Wt 81.3 kg (179 lb 3.7 oz)  BMI 26.85 kg/m2  SpO2 100%  Intake/Output Summary (Last 24 hours) at 05/03/15 0850 Last data filed at 05/03/15 0700  Gross per 24 hour  Intake 4963.33 ml  Output   1785 ml  Net 3178.33 ml   Intake/Output: I/O last 3 completed shifts: In: 6685.8 [I.V.:2453.3; Other:240; NG/GT:3530; IV Piggyback:462.5] Out: 3505 [GEXBM:8413; Stool:1800; Chest Tube:140]  Intake/Output this shift:    Weight change:  KGM:WNUUV elderly WF intubated but awake CVS:no rub Resp:occ rhonchi OZD:GUYQIH Ext:+edema and diffuse rash.   Recent Labs Lab 04/28/15 0435 04/29/15 0458 04/30/15 0500 05/01/15 0400 05/01/15 1345 05/02/15 0400 05/02/15 0753 05/02/15 2100 05/03/15 0447  NA 139 138 138 141 140 147* 148* 141 141  K 4.4 3.7 3.2* 2.8* 2.9* 3.0* 2.9* 3.0* 3.0*  CL 106 108 108 108 109 111 111 108 111  CO2 20* 20* 21* 22 22 22 23 22  21*  GLUCOSE 202* 130* 148* 130* 150* 155* 171* 179* 186*  BUN 36* 50* 59* 64* 65* 74* 76* 78* 79*  CREATININE 1.53* 1.79* 1.96* 1.99* 1.98* 1.92* 1.99* 1.89* 1.76*  ALBUMIN  --   --   --   --   --  1.2*  --   --  1.3*  CALCIUM 7.3* 7.5* 7.3* 7.5* 7.4* 7.9* 8.0* 7.4* 7.3*  PHOS 5.9* 4.9* 4.2 3.8  --  3.9  --   --  3.8  AST  --   --   --   --   --   --   --   --  73*  ALT  --   --   --   --   --   --   --   --  107*   Liver Function Tests:  Recent Labs Lab 05/02/15 0400 05/03/15 0447  AST  --  73*  ALT  --  107*  ALKPHOS  --  147*  BILITOT  --  0.7  PROT  --  3.4*  ALBUMIN 1.2* 1.3*   No results for input(s): LIPASE, AMYLASE in the last 168 hours. No results for input(s): AMMONIA in the last 168 hours. CBC:  Recent Labs Lab 04/29/15 0458  04/30/15 0500 05/01/15 0400 05/02/15 0359 05/03/15 0447  WBC 14.1*  --  8.6 7.5 10.8*  12.8*  NEUTROABS  --   < > 8.2* 6.8 10.2* 12.5*  HGB 8.5*  --  7.8* 7.9* 8.4* 8.1*  HCT 26.3*  --  23.9* 24.2* 25.7* 24.5*  MCV 91.6  --  89.2 89.3 91.1 90.7  PLT 114*  --  101* 110* 127* 156  < > = values in this interval not displayed. Cardiac Enzymes:  Recent Labs Lab 05/03/15 0447  CKTOTAL 30*  CKMB 4.0   CBG:  Recent Labs Lab 05/02/15 1144 05/02/15 1554 05/02/15 2036 05/03/15 0032 05/03/15 0425  GLUCAP 150* 158* 161* 161* 167*    Iron Studies: No results for input(s): IRON, TIBC, TRANSFERRIN, FERRITIN in the last 72 hours. Studies/Results: Dg Chest Port 1 View  05/03/2015  CLINICAL DATA:  Hypoxia.  Recent pericardial window creation. EXAM: PORTABLE CHEST 1 VIEW COMPARISON:  May 02, 2015 FINDINGS: Endotracheal tube tip is 4.3 cm above the carina. Central catheter tip is in  the superior vena cava. Nasogastric tube tip and side port are below the diaphragm. Left chest tube is in position. Currently no pneumothorax is appreciable. There is a right pleural effusion with right base consolidation. Left lung is clear. Heart size is within normal limits. Pulmonary vascularity within normal limits. No adenopathy. There are surgical clips in left axillary region. IMPRESSION: Tube and catheter positions as described without apparent pneumothorax. Moderate right effusion, layering, with right base consolidation, stable. Left lung clear. No change in cardiac silhouette. Electronically Signed   By: Lowella Grip III M.D.   On: 05/03/2015 07:32   Dg Chest Port 1 View  05/02/2015  CLINICAL DATA:  Chest tube. EXAM: PORTABLE CHEST 1 VIEW COMPARISON:  05/01/2015. FINDINGS: Endotracheal tube, left IJ line, NG tube in stable position. Left chest tube in stable position. Tiny left apical pneumothorax cannot be excluded. Right lower lobe infiltrate with right pleural effusion again noted. No interim change. IMPRESSION: 1. Left chest tube in stable position. Tiny left apical pneumothorax  cannot be excluded. 2. Remaining lines and tubes in stable position. 3. Persistent right lower lobe infiltrate and right pleural effusion. No interim change. Critical Value/emergent results were called by telephone at the time of interpretation on 05/02/2015 at 7:36 am to nurse Baxter Flattery, who verbally acknowledged these results. Electronically Signed   By: Marcello Moores  Register   On: 05/02/2015 07:38   . antiseptic oral rinse  7 mL Mouth Rinse QID  . chlorhexidine gluconate  15 mL Mouth Rinse BID  . folic acid  2 mg Per Tube Daily  . free water  200 mL Per Tube 3 times per day  . Gerhardt's butt cream   Topical Daily  . insulin aspart  0-9 Units Subcutaneous 6 times per day  . magnesium sulfate 1 - 4 g bolus IVPB  2 g Intravenous Once  . methylPREDNISolone (SOLU-MEDROL) injection  125 mg Intravenous Q6H  . multivitamin  5 mL Oral Daily  . pantoprazole sodium  40 mg Per Tube Daily  . piperacillin-tazobactam (ZOSYN)  IV  3.375 g Intravenous Q8H  . potassium chloride  40 mEq Per Tube Once  . sodium chloride  1,000 mL Intravenous Once  . sodium chloride  10-40 mL Intracatheter Q12H    BMET    Component Value Date/Time   NA 141 05/03/2015 0447   NA 137 10/08/2014 1321   K 3.0* 05/03/2015 0447   K 3.3 10/08/2014 1321   CL 111 05/03/2015 0447   CL 98 10/08/2014 1321   CO2 21* 05/03/2015 0447   CO2 31 10/08/2014 1321   GLUCOSE 186* 05/03/2015 0447   GLUCOSE 98 10/08/2014 1321   BUN 79* 05/03/2015 0447   BUN 5* 10/08/2014 1321   CREATININE 1.76* 05/03/2015 0447   CREATININE 1.0 10/08/2014 1321   CALCIUM 7.3* 05/03/2015 0447   CALCIUM 9.4 10/08/2014 1321   GFRNONAA 27* 05/03/2015 0447   GFRAA 31* 05/03/2015 0447   CBC    Component Value Date/Time   WBC 12.8* 05/03/2015 0447   WBC 2.1* 10/08/2014 1321   WBC 2.3* 12/19/2007 0820   RBC 2.70* 05/03/2015 0447   RBC 2.60* 05/03/2015 0430   RBC 3.71 10/08/2014 1321   RBC 4.32 12/19/2007 0820   HGB 8.1* 05/03/2015 0447   HGB 12.2 10/08/2014  1321   HGB 14.0 12/19/2007 0820   HCT 24.5* 05/03/2015 0447   HCT 35.9 10/08/2014 1321   HCT 41.0 12/19/2007 0820   PLT 156 05/03/2015 0447   PLT  86* 10/08/2014 1321   PLT 110* 12/19/2007 0820   MCV 90.7 05/03/2015 0447   MCV 97 10/08/2014 1321   MCV 94.9 12/19/2007 0820   MCH 30.0 05/03/2015 0447   MCH 32.9 10/08/2014 1321   MCH 32.4 12/19/2007 0820   MCHC 33.1 05/03/2015 0447   MCHC 34.0 10/08/2014 1321   MCHC 34.2 12/19/2007 0820   RDW 18.6* 05/03/2015 0447   RDW 14.9 10/08/2014 1321   RDW 13.2 12/19/2007 0820   LYMPHSABS 0.3* 05/03/2015 0447   LYMPHSABS 0.2* 10/08/2014 1321   LYMPHSABS 0.3* 12/19/2007 0820   MONOABS 0.0* 05/03/2015 0447   MONOABS 0.2 12/19/2007 0820   EOSABS 0.0 05/03/2015 0447   EOSABS 0.1 10/08/2014 1321   EOSABS 0.1 12/19/2007 0820   BASOSABS 0.0 05/03/2015 0447   BASOSABS 0.0 10/08/2014 1321   BASOSABS 0.0 12/19/2007 0820     Assessment/Plan: 1. AKI- in setting of volume depletion and acute illness as well as thrombo-embolic vasculitis involving the skin. Agree with need to r/o vasculitis and will order serologies. Will check FeNa but suspect it will be low with low CVP and she may benefit from gentle hydration with IV normal saline if okay with PCCM. She also had supra-therapeutic vancomycin levels and may have played a role. Vanc is currently on hold. 1. Low complements and unable to perform cryoglobulin due to the fact her blood would not separate in centrifuge 2. Low FeNa consistent with volume depletion but could also reflect acute GN 3. Start IVF's (1/2 NS due to hypernatremia) but she is too unstable for renal biopsy and recommend conservative therapy 4. ANA, dsDNA, ASO, and ANCA all negative  5. She is not a candidate for renal replacement therapy given plans for trach, debilitated state, and overall poor prognosis.  Recommend transition to comfort measures.  She and her family need to be aware that if she developed renal failure and  required long-term HD, there are no local facilities that would take HD-dependent patients with a trach and may require transfer to another state (if she were to develop renal failure, I feel it would not be appropriate to offer dialysis anyway given her mounting, irreversible disease processes) 2. Rash- biopsy consistent with thrombosis of small vessels, no vasculitis- work up thus far unrevealing. Further workup per Onc and PCCM.  TTP not likely due to rising platelet count, unable to perform cryoglobulins due to patients blood not able to separate in centrifuge.  Lupus anticoagulant workup underway.  Consider Derm consult   3. Hypernatremia- free water deficit has been repleted and sodium levels now WNL continue IVF's of 1/2 NS at 100cc/hr and free water boluses as well and follow. 4. VDRF- weaning trials per PCCM 5. Possible Metastatic breast cancer- Oncology following and no evidence of malignancy found despite an extensive work up. Cytologies and markers are negative thus far. 6. MGUS- recent work up negative for myeloma 7. Normocytic anemia- transfuse as needed. Oncology following. 8. Thrombocytopenia 9. Severe Protein and caloric malnutrition- per PCCM  10. Hypokalemia- repleted by PCCM and follow labs 11. HCAP- per PCCM 12. Disposition- overall prognosis is grim and appreciate hospice/palliative care input is greatly appreciated. 13. Pericardial tamponade- s/p transxyphoid pericardial window and chest tube on left 14. Adrenal necrosis- on hydrocortisone 15. SIRS- recently off of pressors. 16. Disposition- overall prognosis is poor. Has had multiple protracted hospitalizations and appreciate palliative care consult to help set goals and limits of care.  Edison A

## 2015-05-03 NOTE — Progress Notes (Signed)
CRITICAL VALUE ALERT  Critical value received:  Lactic Acid 2.5  Date of notification:  05/03/15  Time of notification:  0521  Critical value read back: yes  Nurse who received alert:  A. Leelan Rajewski Therapist, sports. Report value to patient's nurse W. Investment banker, corporate.   MD notified (1st page):    Time of first page:    MD notified (2nd page):  Time of second page:  Responding MD:    Time MD responded:

## 2015-05-03 NOTE — Plan of Care (Signed)
Repeat dressing changes to both arms and left thigh

## 2015-05-03 NOTE — Progress Notes (Signed)
Daily Progress Note   Patient Name: [name] [name]       Date: 05/03/2015 DOB: 1934/10/16  Age: 80 y.o. 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 She was then readmitted on 04/13/2015 with HCAP and found to have cardiac tamponade s/p pericardial window on 04/17/2015. 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 Off of pressors since 04/30/15.   Interval Events: I met with Ms. Ebbert's children to discuss questions that they had regarding goals of care moving forward, and then we had a family meeting with myself, her 2 children and the patient.   She affirmed remembering our previous conversations, and she indicated again that she has understood our conversations.   I reviewed with her that she remains critically ill with involvement of multiple organ systems.  We discussed her respiratory status, failed breathing trials, and options moving forward of pursuing trach and PEG with eventual transfer to LTAC versus refocusing her care to focus on her comfort.  We discussed that pursuing trach/PEG/LTAC carried a high likelihood of further complications and the reality is that there is an extremely low chance that she would ever recover enough to be return to her home again.  At this point her family  stated that she needs time to process this conversation.  I asked her if she understood the two paths we are looking at and she nodded yes.  I discussed with her again regarding heroic interventions at the end-of-life and that this would not be likely to lead to getting well enough to leave the hospital or ever transition back home. She again seemed to understand this conversation. When asked specifically about CODE STATUS afterward, she again nodded that she would want heroic measures performed at the end-of-life despite understanding concerns that this is unlikely to result in her ever recovering to the point of leaving the hospital. She remains a full code.   - Family again requested more time to process pathways moving forward.  Ms. Thivierge seems to become overwhelmed and disengaged during difficult conversations.  She nods that she understands what we are talking about, but I still am not sure if she truly understands the severity of her situation and pathways moving forward. - I am not sure we have really progressed much in conversations regarding goals of care over the past two days.  Her children are deferring to her regarding decision, but I am unsure if she truly understands her situation.  If there is urgency in making decisions that will impact her care, I think we need to let family know this as I do not sense that they will make any decisions until there is a deadline for making decisions in place. I will plan on trying to connect with CCM tomorrow to discuss this difficult situation.  Length of Stay: 19 days  Current Medications: Scheduled Meds:  . antiseptic oral rinse  7 mL Mouth Rinse QID  . chlorhexidine gluconate  15 mL Mouth Rinse BID  .  folic acid  2 mg Per Tube Daily  . free water  200 mL Per Tube 3 times per day  . Gerhardt's butt cream   Topical Daily  . insulin aspart  0-9 Units Subcutaneous 6 times per day  . methylPREDNISolone (SOLU-MEDROL) injection  125 mg Intravenous  Q6H  . multivitamin  5 mL Oral Daily  . pantoprazole sodium  40 mg Per Tube Daily  . piperacillin-tazobactam (ZOSYN)  IV  3.375 g Intravenous Q8H  . sodium chloride  1,000 mL Intravenous Once  . sodium chloride  10-40 mL Intracatheter Q12H    Continuous Infusions: . sodium chloride 1 mL (05/03/15 1552)  . sodium chloride Stopped (05/02/15 2000)  . feeding supplement (VITAL AF 1.2 CAL) 1,000 mL (05/03/15 1900)  . heparin 1,150 Units/hr (05/03/15 2230)    PRN Meds: fentaNYL (SUBLIMAZE) injection, levalbuterol, midazolam, midazolam, potassium chloride, potassium chloride, sodium chloride  Palliative Performance Scale: 10%     Vital Signs: BP 139/56 mmHg  Pulse 104  Temp(Src) 100.1 F (37.8 C) (Oral)  Resp 33  Ht 5' 8.5" (1.74 m)  Wt 82.5 kg (181 lb 14.1 oz)  BMI 27.25 kg/m2  SpO2 100% SpO2: SpO2: 100 % O2 Device: O2 Device: Ventilator O2 Flow Rate: O2 Flow Rate (L/min): 3 L/min  Intake/output summary:   Intake/Output Summary (Last 24 hours) at 05/03/15 2306 Last data filed at 05/03/15 1900  Gross per 24 hour  Intake   4540 ml  Output   2145 ml  Net   2395 ml   LBM:   Baseline Weight: Weight: 66.6 kg (146 lb 13.2 oz) Most recent weight: Weight: 82.5 kg (181 lb 14.1 oz)  Physical Exam: General appearance: cachectic and intubated Head: Normocephalic, without obvious abnormality, atraumatic Eyes: conjunctivae and sclerae normal  Resp: rhonchi bilaterally Cardio: Regular, no murmur GI: soft, non-tender; bowel sounds normal; no masses, no organomegaly Extremities: covered in diffuse erythemetous rash with yellowish bullae involving the lower extremities and toes as well as chest and arms                Additional Data Reviewed: Recent Labs     05/02/15  0359   05/02/15  2100  05/03/15  0447  WBC  10.8*   --    --   12.8*  HGB  8.4*   --    --   8.1*  PLT  127*   --    --   156  NA   --    < >  141  141  BUN   --    < >  78*  79*  CREATININE   --     < >  1.89*  1.76*   < > = values in this interval not displayed.     Problem List:  Patient Active Problem List   Diagnosis Date Noted  . Vasculopathy 05/03/2015  . AKI (acute kidney injury) ([place]) 05/01/2015  . Paroxysmal ventricular tachycardia ([place]) 04/22/2015  . Altered mental status 04/21/2015  . Acute pulmonary edema ([place]) 04/21/2015  . Sinusitis, acute maxillary 04/21/2015  . Septic shock ([place])   . Chest tube in place   . Endotracheally intubated   . Pleural effusion   . Lung nodules   . Pleural effusion on right   . Pericardial tamponade 04/15/2015  . HCAP (healthcare-associated pneumonia) 03/31/2015  . Sepsis due to pneumonia ([place]) 04/10/2015  . Acute respiratory failure with hypoxia ([place]) 03/30/2015  . Hypokalemia 04/12/2015  .  Pericardial effusion 04/09/2015  . Pressure ulcer 03/26/2015  . S/P laparoscopic splenectomy 04/05/2015  . Generalized weakness 03/30/2015  . HLD (hyperlipidemia) 03/30/2015  . Malnutrition of moderate degree ([place]) 03/18/2015  . Chronic diastolic heart failure ([place])   . Lung nodule 03/14/2015  . Adrenal nodule ([place]) 03/14/2015  . MGUS (monoclonal gammopathy of unknown significance) 03/16/2013     Palliative Care Assessment & Plan    Code Status:  Full code  Goals of Care:  I have met with Ms Gebel on 3 occasions to discuss plans moving forward.  She seems to become overwhelmed easily and therefore we have not progressed in discussions, but she endorsed today understanding options for her care moving forward.  Symptom Management:  She denies symptoms  Prognosis: Unable to determine due to acute illness.  Discharge Planning: To be determined.    Care plan was discussed with patient, her son and daughter, and nursing staff.  Thank you for allowing the Palliative Medicine Team to assist in the care of this patient.   Time In:  1735 Time Out: 1855 Total Time 80 Prolonged Time Billed yes    Greater than 50%  of this time  was spent counseling and coordinating care related to the above assessment and plan.   Micheline Rough, MD  05/03/2015, 11:06 PM  Please contact Palliative Medicine Team phone at [phone] for questions and concerns.

## 2015-05-03 NOTE — Progress Notes (Signed)
ANTICOAGULATION CONSULT NOTE - Initial Consult  Pharmacy Consult for Heparin Indication: Hypercoagulable state  No Known Allergies  Patient Measurements: Height: 5' 8.5" (174 cm) Weight: 179 lb 3.7 oz (81.3 kg) IBW/kg (Calculated) : 65.05  Vital Signs: Temp: 99.2 F (37.3 C) (10/14 0700) Temp Source: Axillary (10/14 0700) BP: 120/46 mmHg (10/14 0900) Pulse Rate: 86 (10/14 0900)  Labs:  Recent Labs  05/01/15 0400  05/02/15 0359  05/02/15 0753 05/02/15 2100 05/03/15 0447 05/03/15 0830  HGB 7.9*  --  8.4*  --   --   --  8.1*  --   HCT 24.2*  --  25.7*  --   --   --  24.5*  --   PLT 110*  --  127*  --   --   --  PENDING  --   LABPROT  --   --   --   --   --   --   --  19.9*  INR  --   --   --   --   --   --   --  1.69*  CREATININE 1.99*  < >  --   < > 1.99* 1.89* 1.76*  --   CKTOTAL  --   --   --   --   --   --  30*  --   CKMB  --   --   --   --   --   --  4.0  --   < > = values in this interval not displayed.  Estimated Creatinine Clearance: 29.8 mL/min (by C-G formula based on Cr of 1.76).   Medical History: Past Medical History  Diagnosis Date  . Abnormal WBC count     low, h/o  . Other and unspecified hyperlipidemia   . Unspecified essential hypertension   . OP (osteoporosis)   . Vitamin D deficiency   . MGUS (monoclonal gammopathy of unknown significance) 03/16/2013  . Back pain   . Breast cancer ([place]) 1981    mastectomy and lymph node removal  . DM type 2 (diabetes mellitus, type 2) ([place])     Pt states she is 'prediabetic'    Assessment: 80 year old female to begin heparin for possible hypercoagulable state / thrombotic vasculopathy.  Goal of Therapy:  Heparin level 0.3-0.7 units/ml Monitor platelets by anticoagulation protocol: Yes   Plan:  Heparin drip at 1250 units / hr Heparin level 8 hours after heparin starts Daily heparin level, CBC  Thank you Anette Guarneri, PharmD [phone]  05/03/2015,9:47 AM

## 2015-05-03 NOTE — Care Management Important Message (Signed)
Important Message  Patient Details  Name: [name] [name] MRN: [id number] Date of Birth: 1934/11/13   Medicare Important Message Given:  Yes-second notification given    Nathen May 05/03/2015, 10:17 AM

## 2015-05-03 NOTE — Plan of Care (Signed)
Wound note: sacral decubitus stage 2 blackened, also has moisture associated dermatitis, Gerhardt's but balm to dermatitis, new sacral foam applied. Bilateral arms purple with tears and weeping, dressings changed. Left thigh holes from biopsy site draining serous, dressing changed. Legs and feet purple with areas of weeping. Pedals doppled. Prevalon boots on, SCD's on (skin under SCD's OK). Bilateral groins weeping from old puncture sites, dressings changed. Foam dressings to areas of legs. Hands also purple.

## 2015-05-04 ENCOUNTER — Inpatient Hospital Stay (HOSPITAL_COMMUNITY): Payer: Medicare Other

## 2015-05-04 DIAGNOSIS — Z515 Encounter for palliative care: Secondary | ICD-10-CM | POA: Insufficient documentation

## 2015-05-04 DIAGNOSIS — Z7189 Other specified counseling: Secondary | ICD-10-CM | POA: Insufficient documentation

## 2015-05-04 DIAGNOSIS — D65 Disseminated intravascular coagulation [defibrination syndrome]: Secondary | ICD-10-CM

## 2015-05-04 LAB — COMPREHENSIVE METABOLIC PANEL
ALBUMIN: 1.2 g/dL — AB (ref 3.5–5.0)
ALK PHOS: 137 U/L — AB (ref 38–126)
ALT: 128 U/L — AB (ref 14–54)
AST: 78 U/L — AB (ref 15–41)
Anion gap: 12 (ref 5–15)
BILIRUBIN TOTAL: 0.8 mg/dL (ref 0.3–1.2)
BUN: 89 mg/dL — AB (ref 6–20)
CALCIUM: 7.2 mg/dL — AB (ref 8.9–10.3)
CO2: 19 mmol/L — AB (ref 22–32)
Chloride: 106 mmol/L (ref 101–111)
Creatinine, Ser: 1.8 mg/dL — ABNORMAL HIGH (ref 0.44–1.00)
GFR calc Af Amer: 30 mL/min — ABNORMAL LOW (ref >60)
GFR calc non Af Amer: 26 mL/min — ABNORMAL LOW (ref >60)
GLUCOSE: 193 mg/dL — AB (ref 65–99)
Potassium: 2.9 mmol/L — ABNORMAL LOW (ref 3.5–5.1)
Sodium: 137 mmol/L (ref 135–145)
TOTAL PROTEIN: 3.2 g/dL — AB (ref 6.5–8.1)

## 2015-05-04 LAB — CBC WITH DIFFERENTIAL/PLATELET
BASOS ABS: 0 10*3/uL (ref 0.0–0.1)
BASOS PCT: 0 %
EOS ABS: 0.1 10*3/uL (ref 0.0–0.7)
Eosinophils Relative: 1 %
HEMATOCRIT: 23.1 % — AB (ref 36.0–46.0)
Hemoglobin: 7.7 g/dL — ABNORMAL LOW (ref 12.0–15.0)
LYMPHS ABS: 0.2 10*3/uL — AB (ref 0.7–4.0)
Lymphocytes Relative: 2 %
MCH: 30.1 pg (ref 26.0–34.0)
MCHC: 33.3 g/dL (ref 30.0–36.0)
MCV: 90.2 fL (ref 78.0–100.0)
MONOS PCT: 3 %
Monocytes Absolute: 0.4 10*3/uL (ref 0.1–1.0)
NEUTROS ABS: 11 10*3/uL — AB (ref 1.7–7.7)
Neutrophils Relative %: 94 %
Platelets: 186 10*3/uL (ref 150–400)
RBC: 2.56 MIL/uL — ABNORMAL LOW (ref 3.87–5.11)
RDW: 19.3 % — AB (ref 11.5–15.5)
WBC: 11.7 10*3/uL — ABNORMAL HIGH (ref 4.0–10.5)

## 2015-05-04 LAB — GLUCOSE, CAPILLARY
GLUCOSE-CAPILLARY: 166 mg/dL — AB (ref 65–99)
GLUCOSE-CAPILLARY: 169 mg/dL — AB (ref 65–99)
GLUCOSE-CAPILLARY: 176 mg/dL — AB (ref 65–99)
GLUCOSE-CAPILLARY: 177 mg/dL — AB (ref 65–99)
Glucose-Capillary: 173 mg/dL — ABNORMAL HIGH (ref 65–99)

## 2015-05-04 LAB — HEPARIN LEVEL (UNFRACTIONATED)
Heparin Unfractionated: 0.95 IU/mL — ABNORMAL HIGH (ref 0.30–0.70)
Heparin Unfractionated: 0.74 IU/mL — ABNORMAL HIGH (ref 0.30–0.70)

## 2015-05-04 LAB — PROTIME-INR
INR: 1.67 — AB (ref 0.00–1.49)
Prothrombin Time: 19.7 seconds — ABNORMAL HIGH (ref 11.6–15.2)

## 2015-05-04 LAB — MAGNESIUM: MAGNESIUM: 2 mg/dL (ref 1.7–2.4)

## 2015-05-04 LAB — LACTIC ACID, PLASMA: Lactic Acid, Venous: 3 mmol/L (ref 0.5–2.0)

## 2015-05-04 LAB — PHOSPHORUS: Phosphorus: 4.4 mg/dL (ref 2.5–4.6)

## 2015-05-04 MED ORDER — SODIUM CHLORIDE 0.9 % IV BOLUS (SEPSIS)
500.0000 mL | Freq: Once | INTRAVENOUS | Status: AC
  Administered 2015-05-04: 500 mL via INTRAVENOUS

## 2015-05-04 MED ORDER — SODIUM CHLORIDE 0.9 % IV BOLUS (SEPSIS): 500.0000 mL | Freq: Once | INTRAVENOUS | Status: DC

## 2015-05-04 MED ORDER — SODIUM CHLORIDE 0.9 % IV SOLN
1.0000 mg/h | INTRAVENOUS | Status: DC
  Administered 2015-05-04: 2 mg/h via INTRAVENOUS
  Filled 2015-05-04: qty 10

## 2015-05-04 MED ORDER — POTASSIUM CHLORIDE 10 MEQ/50ML IV SOLN
10.0000 meq | INTRAVENOUS | Status: AC | PRN
  Administered 2015-05-04 (×3): 10 meq via INTRAVENOUS

## 2015-05-04 NOTE — Progress Notes (Signed)
Patient ID: [name] [name], female   DOB: 04/07/1935, 80 y.o.   MRN: [id number] S:intubated O:BP 122/63 mmHg  Pulse 97  Temp(Src) 99.3 F (37.4 C) (Oral)  Resp 27  Ht 5' 8.5" (1.74 m)  Wt 86.4 kg (190 lb 7.6 oz)  BMI 28.54 kg/m2  SpO2 100%  Intake/Output Summary (Last 24 hours) at 05/04/15 0813 Last data filed at 05/04/15 0700  Gross per 24 hour  Intake 5181.5 ml  Output   2265 ml  Net 2916.5 ml   Intake/Output: I/O last 3 completed shifts: In: 8211.5 [I.V.:4036.5; Other:180; NG/GT:3370; IV Piggyback:625] Out: 1937 [Urine:1550; TKWIO:9735; Chest Tube:245]  Intake/Output this shift:    Weight change: 3.9 kg (8 lb 9.6 oz) HGD:JMEQA, critically ill-appearing WF CVS:no rub Resp:scattered rhonchi STM:HDQQIW LNL:GXQJJHE purpuric rash with bullae   Recent Labs Lab 04/28/15 0435 04/29/15 0458 04/30/15 0500 05/01/15 0400 05/01/15 1345 05/02/15 0400 05/02/15 0753 05/02/15 2100 05/03/15 0447 05/04/15 0400  NA 139 138 138 141 140 147* 148* 141 141 137  K 4.4 3.7 3.2* 2.8* 2.9* 3.0* 2.9* 3.0* 3.0* 2.9*  CL 106 108 108 108 109 111 111 108 111 106   CO2 20* 20* 21* 22 22 22 23 22  21* 19*  GLUCOSE 202* 130* 148* 130* 150* 155* 171* 179* 186* 193*  BUN 36* 50* 59* 64* 65* 74* 76* 78* 79* 89*  CREATININE 1.53* 1.79* 1.96* 1.99* 1.98* 1.92* 1.99* 1.89* 1.76* 1.80*  ALBUMIN  --   --   --   --   --  1.2*  --   --  1.3* 1.2*  CALCIUM 7.3* 7.5* 7.3* 7.5* 7.4* 7.9* 8.0* 7.4* 7.3* 7.2*  PHOS 5.9* 4.9* 4.2 3.8  --  3.9  --   --  3.8 4.4  AST  --   --   --   --   --   --   --   --  73* 78*  ALT  --   --   --   --   --   --   --   --  107* 128*   Liver Function Tests:  Recent Labs Lab 05/02/15 0400 05/03/15 0447 05/04/15 0400  AST  --  73* 78*  ALT  --  107* 128*  ALKPHOS  --  147* 137*  BILITOT  --  0.7 0.8  PROT  --  3.4* 3.2*  ALBUMIN 1.2* 1.3* 1.2*   No results for input(s): LIPASE, AMYLASE in the last 168 hours. No results for input(s): AMMONIA in the last 168  hours. CBC:  Recent Labs Lab 04/30/15 0500 05/01/15 0400 05/02/15 0359 05/03/15 0447 05/04/15 0400  WBC 8.6 7.5 10.8* 12.8* 11.7*  NEUTROABS 8.2* 6.8 10.2* 12.5* 11.0*  HGB 7.8* 7.9* 8.4* 8.1* 7.7*  HCT 23.9* 24.2* 25.7* 24.5* 23.1*  MCV 89.2 89.3 91.1 90.7 90.2  PLT 101* 110* 127* 156 186   Cardiac Enzymes:  Recent Labs Lab 05/03/15 0447  CKTOTAL 30*  CKMB 4.0   CBG:  Recent Labs Lab 05/03/15 1140 05/03/15 1516 05/03/15 2001 05/04/15 0034 05/04/15 0342  GLUCAP 135* 147* 128* 166* 176*    Iron Studies: No results for input(s): IRON, TIBC, TRANSFERRIN, FERRITIN in the last 72 hours. Studies/Results: Dg Chest Port 1 View  05/04/2015  CLINICAL DATA:  ETT EXAM: PORTABLE CHEST 1 VIEW COMPARISON:  05/03/2015 FINDINGS: Endotracheal tube terminates 6 cm above the carina. Left chest port, without visualized pneumothorax. Small to moderate layering right pleural effusion. Left IJ venous catheter terminates  in the lower SVC. Enteric tube courses into the stomach. The heart is normal in size. IMPRESSION: Endotracheal tube terminates cm above the carina. Left chest port, without visualized pneumothorax. Associated stable support apparatus as above. Electronically Signed   By: Julian Hy M.D.   On: 05/04/2015 07:14   Dg Chest Port 1 View  05/03/2015  CLINICAL DATA:  Hypoxia.  Recent pericardial window creation. EXAM: PORTABLE CHEST 1 VIEW COMPARISON:  May 02, 2015 FINDINGS: Endotracheal tube tip is 4.3 cm above the carina. Central catheter tip is in the superior vena cava. Nasogastric tube tip and side port are below the diaphragm. Left chest tube is in position. Currently no pneumothorax is appreciable. There is a right pleural effusion with right base consolidation. Left lung is clear. Heart size is within normal limits. Pulmonary vascularity within normal limits. No adenopathy. There are surgical clips in left axillary region. IMPRESSION: Tube and catheter positions as  described without apparent pneumothorax. Moderate right effusion, layering, with right base consolidation, stable. Left lung clear. No change in cardiac silhouette. Electronically Signed   By: Lowella Grip III M.D.   On: 05/03/2015 07:32   . antiseptic oral rinse  7 mL Mouth Rinse QID  . chlorhexidine gluconate  15 mL Mouth Rinse BID  . folic acid  2 mg Per Tube Daily  . free water  200 mL Per Tube 3 times per day  . Gerhardt's butt cream   Topical Daily  . insulin aspart  0-9 Units Subcutaneous 6 times per day  . methylPREDNISolone (SOLU-MEDROL) injection  125 mg Intravenous Q6H  . multivitamin  5 mL Oral Daily  . pantoprazole sodium  40 mg Per Tube Daily  . piperacillin-tazobactam (ZOSYN)  IV  3.375 g Intravenous Q8H  . sodium chloride  1,000 mL Intravenous Once  . sodium chloride  10-40 mL Intracatheter Q12H    BMET    Component Value Date/Time   NA 137 05/04/2015 0400   NA 137 10/08/2014 1321   K 2.9* 05/04/2015 0400   K 3.3 10/08/2014 1321   CL 106 05/04/2015 0400   CL 98 10/08/2014 1321   CO2 19* 05/04/2015 0400   CO2 31 10/08/2014 1321   GLUCOSE 193* 05/04/2015 0400   GLUCOSE 98 10/08/2014 1321   BUN 89* 05/04/2015 0400   BUN 5* 10/08/2014 1321   CREATININE 1.80* 05/04/2015 0400   CREATININE 1.0 10/08/2014 1321   CALCIUM 7.2* 05/04/2015 0400   CALCIUM 9.4 10/08/2014 1321   GFRNONAA 26* 05/04/2015 0400   GFRAA 30* 05/04/2015 0400   CBC    Component Value Date/Time   WBC 11.7* 05/04/2015 0400   WBC 2.1* 10/08/2014 1321   WBC 2.3* 12/19/2007 0820   RBC 2.56* 05/04/2015 0400   RBC 2.60* 05/03/2015 0430   RBC 3.71 10/08/2014 1321   RBC 4.32 12/19/2007 0820   HGB 7.7* 05/04/2015 0400   HGB 12.2 10/08/2014 1321   HGB 14.0 12/19/2007 0820   HCT 23.1* 05/04/2015 0400   HCT 35.9 10/08/2014 1321   HCT 41.0 12/19/2007 0820   PLT 186 05/04/2015 0400   PLT 86* 10/08/2014 1321   PLT 110* 12/19/2007 0820   MCV 90.2 05/04/2015 0400   MCV 97 10/08/2014 1321   MCV  94.9 12/19/2007 0820   MCH 30.1 05/04/2015 0400   MCH 32.9 10/08/2014 1321   MCH 32.4 12/19/2007 0820   MCHC 33.3 05/04/2015 0400   MCHC 34.0 10/08/2014 1321   MCHC 34.2 12/19/2007 0820  RDW 19.3* 05/04/2015 0400   RDW 14.9 10/08/2014 1321   RDW 13.2 12/19/2007 0820   LYMPHSABS 0.2* 05/04/2015 0400   LYMPHSABS 0.2* 10/08/2014 1321   LYMPHSABS 0.3* 12/19/2007 0820   MONOABS 0.4 05/04/2015 0400   MONOABS 0.2 12/19/2007 0820   EOSABS 0.1 05/04/2015 0400   EOSABS 0.1 10/08/2014 1321   EOSABS 0.1 12/19/2007 0820   BASOSABS 0.0 05/04/2015 0400   BASOSABS 0.0 10/08/2014 1321   BASOSABS 0.0 12/19/2007 0820     Assessment/Plan: 1. AKI- in setting of volume depletion and acute illness as well as thrombo-embolic vasculitis involving the skin. Agree with need to r/o vasculitis and will order serologies. Will check FeNa but suspect it will be low with low CVP and she may benefit from gentle hydration with IV normal saline if okay with PCCM. She also had supra-therapeutic vancomycin levels and may have played a role. Vanc is currently on hold. 1. Low complements and unable to perform cryoglobulin due to the fact her blood would not separate in centrifuge 2. Low FeNa consistent with intravascular volume depletion  3. Start IVF's (1/2 NS due to hypernatremia) but she is too unstable for renal biopsy or immunosuppressive therapy 4. ANA, dsDNA, ASO, and ANCA all negative  5. She is not a candidate for renal replacement therapy given multi-organ system involvement, debilitated state, failure to wean from vent and possible plans for trach, and overall poor prognosis. Recommend transition to comfort measures. Appreciate efforts of Palliative care team and despite her wishes to have heroic measures at end of life, dialysis would be medically futile and therefore would not be offered.   2. Rash- biopsy consistent with thrombosis of small vessels, no vasculitis.  Lupus anticoagulant workup  underway. 3. Hypernatremia- free water deficit has been repleted and sodium levels now WNL continue IVF's of 1/2 NS at 100cc/hr and free water boluses as well and follow. 4. VDRF- weaning trials per PCCM 5. Possible Metastatic breast cancer- Oncology following and no evidence of malignancy found despite an extensive work up. Cytologies and markers are negative thus far. 6. MGUS- recent work up negative for myeloma 7. Normocytic anemia- transfuse as needed. Oncology following. 8. Thrombocytopenia 9. Severe Protein and caloric malnutrition- per PCCM  10. Hypokalemia- repleted by PCCM and follow labs 11. HCAP- per PCCM 12. Disposition- overall prognosis is grim and appreciate hospice/palliative care input is greatly appreciated. 13. Pericardial tamponade- s/p transxyphoid pericardial window and chest tube on left 14. Adrenal necrosis- on hydrocortisone 15. SIRS- recently off of pressors. 16. Disposition- overall prognosis is poor. Has had multiple protracted hospitalizations and appreciate palliative care consult to help set goals and limits of care.  Scr is stable.  Nothing further to add so will sign off.  Please call with questions or concerns.  Lucky A

## 2015-05-04 NOTE — Progress Notes (Signed)
eLink Physician-Brief Progress Note Patient Name: [name] [name] DOB: [birth year] MRN: [id number]   Date of Service  05/04/2015  HPI/Events of Note  bp's soft despite absence of air trapping now  eICU Interventions  rec decrease sedation / ns bolus x 500     Intervention Category Major Interventions: Shock - evaluation and management  Christinia Gully 05/04/2015, 7:37 PM

## 2015-05-04 NOTE — Progress Notes (Signed)
PULMONARY / CRITICAL CARE MEDICINE   Name: [name] [name] MRN: [id number] DOB: Nov 29, 1934    ADMISSION DATE:  04/13/2015  LOS 20 days   REFERRING MD :  [facility]  CHIEF COMPLAINT:  Cough  INITIAL PRESENTATION:  80 yo admitted with cough and fever from sepsis and HCAP.  She was found to have cardiac tamponade.  She has hx of breast cancer with possible metastatic disease, MGUS.  STUDIES/EVENT:  02/20/15 - bone marrow - predom CD8 cells 04/03/15 - Left adrenal bx  - extensive necrosis  04/05/15 - splenectomy bx -  ...Marland KitchenMarland KitchenMarland Kitchen 09/25  Admit, oncology consult 9/26  Thoracentesis  9/26  CT chest >> b/l pleural effusions, large pericardial effusion, 2.3 cm RLL nodule (was 1.8 cm) 9/26  ECHO >> tamponade  09/26  Cardiology consulted 09/27  TCTS consulted >> subxyphoid pericardial window. PERICARDIAL BIOPSY - NO EVIDENCE OF MALIGNANCY 09/28  Extubated 10/116 - echo - nromal EF 10/02  Fever, altered mental status >>To ICU. Intubated 10/03  NSVT and torsades.  10/6  MRI Head >> negative for acute pathology  10/06  Extubated  10/07  Reintubated for hypoxemia and AMS. AUTOIMMUNE NEGATIVE (ds dna, ana, rF, ANCA, Ig, ssa, ssB, scl70) 04/28/15: : Reintubated overnight, mottled overnight with high pressor demand. 04/29/15: Off levophed.extensive dermal necrosis all over body.  04/30/15 - LEFT ANTERIOR THIGH BX ->creat rising to 1.9   05/01/15 - no clear evidence of malignancy per Dr Marin Olp notes. Palliative care today at 79/02 per Elmo Putt of pallitive. PEr rN no overnight events.   05/02/15: derm bx - prelim d./w DR Caryl Ada  thrombotic changes in vessels. No evidence of vasculitys. 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 DIF negative - so Non IgA. Not leukocytoclastvic vasculitis. Ddx -  Lupus anticoag, Cyroglobulinemia,  HUS-TTP, Early DIC (doubt due to lack of epidremal necrosis). No evidence of vasculitis . No evidence of Wegners. No neutrophils seen. Dr Tamala Julian feel patient is necrosing  but not sure why. Per renal note 05/02/15 : "Low complements on 05/01/15 and unable to perform cryoglobulin due to the fact her blood would not separate in centrifuge"    SUBJECTIVE/OVERNIGHT/INTERVAL HX Afebrile Anxious/ agitated on WUA per RN On heparin gtt UO decreasing   VITAL SIGNS: Temp:  [98.3 F (36.8 C)-100.1 F (37.8 C)] 99.3 F (37.4 C) (10/15 0700) Pulse Rate:  [93-110] 100 (10/15 1300) Resp:  [15-37] 23 (10/15 1300) BP: (92-146)/(47-114) 107/58 mmHg (10/15 1300) SpO2:  [91 %-100 %] 100 % (10/15 1300) FiO2 (%):  [40 %] 40 % (10/15 1247) Weight:  [86.4 kg (190 lb 7.6 oz)] 86.4 kg (190 lb 7.6 oz) (10/15 0600)   HEMODYNAMICS: CVP:  [5 mmHg] 5 mmHg   VENTILATOR SETTINGS: Vent Mode:  [-] PRVC FiO2 (%):  [40 %] 40 % Set Rate:  [22 bmp] 22 bmp Vt Set:  [390 mL] 390 mL PEEP:  [5 cmH20] 5 cmH20 Pressure Support:  [10 cmH20] 10 cmH20 Plateau Pressure:  [15 cmH20-23 cmH20] 23 cmH20   INTAKE / OUTPUT:  Intake/Output Summary (Last 24 hours) at 05/04/15 1404 Last data filed at 05/04/15 1300  Gross per 24 hour  Intake 5215.2 ml  Output   1860 ml  Net 3355.2 ml   PHYSICAL EXAMINATION: General: chronically crtiically ill appearing female  Neuro:  RASS 0 to -1, .sedaetd Cardiovascular:  S1, S2, No MRG. Lungs:  Even/non-labored, coarse BS diffusely. Abdomen:  Soft, non tender, BSx4 active  Musculoskeletal:  No acute deformities Skin:  Extensive ecchymotic/  necrosis like lesions across forearm, thigh and even nose - unchanged since 05/02/15   LABS:  PULMONARY  Recent Labs Lab 04/27/15 2105 04/27/15 2116 04/27/15 2240 04/28/15 1520 04/29/15 0405  PHART  --  7.222* TEST WILL BE CREDITED 7.332* 7.330*  PCO2ART  --  45.6* TEST WILL BE CREDITED 38.8 39.6  PO2ART  --  47.0* TEST WILL BE CREDITED 229.0* 194*  HCO3 18.4* 18.7* TEST WILL BE CREDITED 20.6 20.3  TCO2 20 20 TEST WILL BE CREDITED 22 21.5  O2SAT 50.0 74.0 TEST WILL BE CREDITED 100.0 99.2     CBC  Recent Labs Lab 05/02/15 0359 05/03/15 0447 05/04/15 0400  HGB 8.4* 8.1* 7.7*  HCT 25.7* 24.5* 23.1*  WBC 10.8* 12.8* 11.7*  PLT 127* 156 186    COAGULATION  Recent Labs Lab 04/30/15 0500 05/03/15 0830 05/04/15 0645  INR 1.29 1.69* 1.67*    CARDIAC  No results for input(s): TROPONINI in the last 168 hours. No results for input(s): PROBNP in the last 168 hours.   CHEMISTRY  Recent Labs Lab 04/30/15 0500 05/01/15 0400  05/02/15 0400 05/02/15 0753 05/02/15 2100 05/03/15 0447 05/04/15 0400  NA 138 141  < > 147* 148* 141 141 137  K 3.2* 2.8*  < > 3.0* 2.9* 3.0* 3.0* 2.9*  CL 108 108  < > 111 111 108 111 106  CO2 21* 22  < > 22 23 22  21* 19*  GLUCOSE 148* 130*  < > 155* 171* 179* 186* 193*  BUN 59* 64*  < > 74* 76* 78* 79* 89*  CREATININE 1.96* 1.99*  < > 1.92* 1.99* 1.89* 1.76* 1.80*  CALCIUM 7.3* 7.5*  < > 7.9* 8.0* 7.4* 7.3* 7.2*  MG 2.2 2.0  --  2.0  --   --  1.9 2.0  PHOS 4.2 3.8  --  3.9  --   --  3.8 4.4  < > = values in this interval not displayed. Estimated Creatinine Clearance: 29.9 mL/min (by C-G formula based on Cr of 1.8).   LIVER  Recent Labs Lab 04/30/15 0500 05/02/15 0400 05/03/15 0447 05/03/15 0830 05/04/15 0400 05/04/15 0645  AST  --   --  73*  --  78*  --   ALT  --   --  107*  --  128*  --   ALKPHOS  --   --  147*  --  137*  --   BILITOT  --   --  0.7  --  0.8  --   PROT  --   --  3.4*  --  3.2*  --   ALBUMIN  --  1.2* 1.3*  --  1.2*  --   INR 1.29  --   --  1.69*  --  1.67*     INFECTIOUS  Recent Labs Lab 05/01/15 1345 05/03/15 0430 05/04/15 0645  LATICACIDVEN 2.3* 2.5* 3.0*     ENDOCRINE CBG (last 3)   Recent Labs  05/04/15 0342 05/04/15 0811 05/04/15 1311  GLUCAP 176* 169* 177*         IMAGING x48h  - image(s) personally visualized  -   highlighted in bold Dg Chest Port 1 View  05/04/2015  CLINICAL DATA:  ETT EXAM: PORTABLE CHEST 1 VIEW COMPARISON:  05/03/2015 FINDINGS: Endotracheal tube  terminates 6 cm above the carina. Left chest port, without visualized pneumothorax. Small to moderate layering right pleural effusion. Left IJ venous catheter terminates in the lower SVC. Enteric tube courses into the  stomach. The heart is normal in size. IMPRESSION: Endotracheal tube terminates cm above the carina. Left chest port, without visualized pneumothorax. Associated stable support apparatus as above. Electronically Signed   By: Julian Hy M.D.   On: 05/04/2015 07:14   Dg Chest Port 1 View  05/03/2015  CLINICAL DATA:  Hypoxia.  Recent pericardial window creation. EXAM: PORTABLE CHEST 1 VIEW COMPARISON:  May 02, 2015 FINDINGS: Endotracheal tube tip is 4.3 cm above the carina. Central catheter tip is in the superior vena cava. Nasogastric tube tip and side port are below the diaphragm. Left chest tube is in position. Currently no pneumothorax is appreciable. There is a right pleural effusion with right base consolidation. Left lung is clear. Heart size is within normal limits. Pulmonary vascularity within normal limits. No adenopathy. There are surgical clips in left axillary region. IMPRESSION: Tube and catheter positions as described without apparent pneumothorax. Moderate right effusion, layering, with right base consolidation, stable. Left lung clear. No change in cardiac silhouette. Electronically Signed   By: Lowella Grip III M.D.   On: 05/03/2015 07:32       ASSESSMENT / PLAN:  PULMONARY A: Acute hypoxic respiratory failure. Rt lung nodule. Pleural effusions - thora 9/26 with negative cultures ETT 10/2>>>10/6>>>10/7>>>   - doubt can successfuly extubate decondituioning and volume overload  P:   Daily SBT - needs trach/LTAC if family/patient  decides to pursue full code Will need a biopsy of the RLL opacity v followup CT chest  at some point when stable for work up of malignancy. Oxygen as needed to support saturations > 92%  CARDIOVASCULAR Lt IJ CVL 9/27>>>   A:  Pericardial effusion with tamponade s/p window 9/27 with resultant chest tube. Nondiagnostic bx Hx of diastolic CHF, CAD, HLD. NSVT and torsades - none since 10/3 On hydrocort since 04/27/15 plasma cortisol of 0.6, definitely adrenally insufficient.   - off pressors since 04/29/15 AM  P:  Monitor hemodynamics. Chest tube management per CVTS  RENAL A:   AKI. -   - improving 05/03/2015 after renal consu.t fluids + vacn dc  + change of hydrocort to solumedrol 05/02/15   - having moderate hypokalemia and mild hypomag < 2gm%  P:   Hold vancmycin Monitor renal fx. Replace electrolytes as needed.  Avoid nephrotoxic drugs   GASTROINTESTINAL A:   Protein cal malnutrition.  P:   Tube feeds PPI - home medication.  HEMATOLOGIC A:   Hx of splenectomy, MGUS, breast cancer - was on EVISTA. Thrombocytopenia - may be secondary to stress, sepsis   P:  Hold Evista since 05/02/15 due to vasculopathy on skin bx Dr Marin Olp ffollowing Monitor for bleeding  SCD's for DVT prophylaxis   INFECTIOUS Blood 10/02 >> NTD Urine 10/02 >> NTD Sputum 10/02 >> normal flora  Blood 10/8>>>ng Sputum 10/8>>>ng   A:   HCAP - possible . No clear evidence of sepsis P:   Zosyn 10/8>>> (aim to stop 05/04/15) Vanc start date unclear to me>>> 05/01/2015 (MRSA PCR negative, culture negative and rising creat)   ENDOCRINE A:   Adrenal insufficiency. Necrotic adrenals seen on recent biopsy P:   Hydrocortisone -> change to Solumedrol 05/02/15 (see autoimmnune)  AUTOIMMUNE ANCA >> NEG DS DNA >> neg SCL70 >> neg RF >> 30 SSA/SSB >> neg IgG low IgA normal Complement 05/01/15 - low Crygolobulin 05/01/15 - could not do because blood would not separate in centrifuge due to low complement Repeat cryoglobumin 05/02/15 D-dimer , fibrinogen, Protein c 05/03/15>>  A:  Ongoing skin lesions + 04/30/15 - Bx - NO VASCULITIS but has SMALL and MID VESSEL THROMBOSIS -  - ddx TTP-HUS,  crygolobulinemia, lupus anticoag, drugs  duplex UE - 05/03/15 neg, LEG unable to interpret P:  Hold EVISTA breast cancer prophyalxis - has black box warning for dvt (on hold since 05/02/15) Await  Cryoglobulin repeat 05/02/15 Change hydrocort to solumedrol 05/02/15 >> Ct heparin, OK to dc SCDs   NEUROLOGIC A:   Acute encephalopathy   - resolved since 04/30/15  P:   Prn fent and versed Add ativan for anxiety Hold lexapro   ONCOLOGY A: No evidence of ongoing current malignancy . There is only hx of remote breast cancer > 20 years ago - see Dr Marin Olp notes 57/84/69  P  - uncertain prognosis. No terminal dx found  Overall  - imprioving but very decodnitioned. Etiology of purpura fulminans unclear , vasculitis possible  PEr Dr Marin Olp 05/01/15 notes- no terminal diagnosis found.  Prognosis uncertain. Palliative conversations noted,  Likely will need ltac/trach  The patient is critically ill with multiple organ systems failure and requires high complexity decision making for assessment and support, frequent evaluation and titration of therapies, application of advanced monitoring technologies and extensive interpretation of multiple databases. Critical Care Time devoted to patient care services described in this note independent of APP time is 31 minutes.   Kara Mead MD. [facility] Flood. [facility] Pager [phone] If no response call [phone]     05/04/2015 2:03 PM

## 2015-05-04 NOTE — Progress Notes (Signed)
[facility] for Heparin Indication: Hypercoagulable state / thrombotic vasculopathy  No Known Allergies   Assessment: 80 year old female to begin heparin for possible hypercoagulable state / thrombotic vasculopathy. Heparin level = 0.74 this PM HgB trending down  Day # 8 of Zosyn - consider stopping?  Goal of Therapy:  Heparin level 0.3-0.7 units/ml Monitor platelets by anticoagulation protocol: Yes   Plan:  Decrease Heparin drip to 800 units / hr Daily heparin level, CBC      Patient Measurements: Height: 5' 8.5" (174 cm) Weight: 190 lb 7.6 oz (86.4 kg) IBW/kg (Calculated) : 65.05  Vital Signs: Temp: 98.1 F (36.7 C) (10/15 2000) Temp Source: Axillary (10/15 2000) BP: 89/22 mmHg (10/15 2030) Pulse Rate: 81 (10/15 2030)  Labs:  Recent Labs  05/02/15 0359  05/02/15 2100 05/03/15 0447 05/03/15 0830 05/03/15 1953 05/04/15 0400 05/04/15 0645 05/04/15 2030  HGB 8.4*  --   --  8.1*  --   --  7.7*  --   --   HCT 25.7*  --   --  24.5*  --   --  23.1*  --   --   PLT 127*  --   --  156  --   --  186  --   --   LABPROT  --   --   --   --  19.9*  --   --  19.7*  --   INR  --   --   --   --  1.69*  --   --  1.67*  --   HEPARINUNFRC  --   --   --   --   --  0.73*  --  0.95* 0.74*  CREATININE  --   < > 1.89* 1.76*  --   --  1.80*  --   --   CKTOTAL  --   --   --  30*  --   --   --   --   --   CKMB  --   --   --  4.0  --   --   --   --   --   < > = values in this interval not displayed.  Estimated Creatinine Clearance: 29.9 mL/min (by C-G formula based on Cr of 1.8).   Thank you Anette Guarneri, PharmD [phone]  05/04/2015 9:05 PM

## 2015-05-04 NOTE — Progress Notes (Signed)
eLink Physician-Brief Progress Note Patient Name: [name] [name] DOB: May 18, 1935 MRN: [id number]   Date of Service  05/04/2015  HPI/Events of Note  Getting dysynchronous with vent and dropping bp with  cvp  5 despite airtrapping (which artificially drives it up so this is not as high as it looks     Intake/Output Summary (Last 24 hours) at 05/04/15 1635 Last data filed at 05/04/15 1300  Gross per 24 hour  Intake 4740.2 ml  Output   1640 ml  Net 3100.2 ml     eICU Interventions  Since this is not ards will increase Tidal vol, decrease rate and sedate we can capture her and drive rr/autopeep down  Ns x 500 cc also for cvp 5      Intervention Category Major Interventions: Respiratory failure - evaluation and management  Christinia Gully 05/04/2015, 4:34 PM

## 2015-05-04 NOTE — Progress Notes (Signed)
Daily Progress Note   Patient Name: [name] [name]       Date: 05/04/2015 DOB: 1934-12-23  Age: 80 y.o. 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 She was then readmitted on 04/01/2015 with HCAP and found to have cardiac tamponade s/p pericardial window on 03/31/2015. 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 Off of pressors since 04/30/15.   Interval Events: I stopped by to check on [name] [name]. She has had a change in her mental status and is no longer participating conversation with me. I discussed her clinical course with her bedside nurse and called her daughter to make sure that she is up-to-date on most current information.  Length of Stay: 20 days  Current Medications: Scheduled Meds:  . antiseptic oral rinse  7 mL Mouth Rinse QID  . chlorhexidine gluconate  15 mL Mouth Rinse BID  . folic acid  2 mg Per Tube Daily  . free water  200 mL Per Tube 3 times per day  . Gerhardt's butt cream   Topical Daily  . insulin aspart  0-9 Units Subcutaneous 6 times per day  . methylPREDNISolone (SOLU-MEDROL) injection  125 mg Intravenous Q6H  . multivitamin  5 mL Oral Daily  . pantoprazole sodium  40 mg Per Tube Daily  . piperacillin-tazobactam (ZOSYN)  IV  3.375 g Intravenous Q8H  . sodium  chloride  1,000 mL Intravenous Once  . sodium chloride  500 mL Intravenous Once  . sodium chloride  10-40 mL Intracatheter Q12H    Continuous Infusions: . sodium chloride 100 mL/hr at 05/04/15 2000  . feeding supplement (VITAL AF 1.2 CAL) 1,000 mL (05/04/15 2000)  . heparin 950 Units/hr (05/04/15 2000)  . midazolam (VERSED) infusion 2 mg/hr (05/04/15 2000)    PRN Meds: fentaNYL (SUBLIMAZE) injection, levalbuterol, potassium chloride, potassium chloride, sodium chloride  Palliative Performance Scale: 10%     Vital Signs: BP 87/48 mmHg  Pulse 73  Temp(Src) 98.1 F (36.7 C) (Axillary)  Resp 18  Ht 5' 8.5" (1.74 m)  Wt 86.4 kg (190 lb 7.6 oz)  BMI 28.54 kg/m2  SpO2 100% SpO2: SpO2: 100 % O2 Device: O2 Device: Ventilator O2 Flow Rate: O2 Flow Rate (L/min): 3 L/min  Intake/output summary:   Intake/Output Summary (Last 24 hours) at 05/04/15 2011 Last data filed at 05/04/15 2000  Gross per 24 hour  Intake 5265.7 ml  Output   1440 ml  Net 3825.7 ml   LBM:   Baseline Weight: Weight: 66.6 kg (146 lb 13.2 oz) Most recent weight: Weight: 86.4 kg (190 lb 7.6 oz)  Physical Exam: General appearance: cachectic and intubated Head: Normocephalic, without obvious abnormality, atraumatic Resp: rhonchi bilaterally Cardio: Regular, no murmur GI: soft, non-tender; bowel sounds normal; no masses, no organomegaly Extremities: covered in diffuse erythemetous rash with yellowish bullae involving the lower extremities and toes as well as chest and arms  Additional Data Reviewed: Recent Labs     05/03/15  0447  05/04/15  0400  WBC  12.8*  11.7*  HGB  8.1*  7.7*  PLT  156  186  NA  141  137  BUN  79*  89*  CREATININE  1.76*  1.80*     Problem List:  Patient Active Problem List   Diagnosis Date Noted  . Goals of care, counseling/discussion   . Palliative care encounter   . Vasculopathy 05/03/2015  . AKI (acute kidney injury) ([place]) 05/01/2015  .  Paroxysmal ventricular tachycardia ([place]) 04/22/2015  . Altered mental status 04/21/2015  . Acute pulmonary edema ([place]) 04/21/2015  . Sinusitis, acute maxillary 04/21/2015  . Septic shock ([place])   . Chest tube in place   . Endotracheally intubated   . Pleural effusion   . Lung nodules   . Pleural effusion on right   . Pericardial tamponade 04/15/2015  . HCAP (healthcare-associated pneumonia) 04/11/2015  . Sepsis due to pneumonia ([place]) 04/06/2015  . Acute respiratory failure with hypoxia ([place]) 03/22/2015  . Hypokalemia 04/19/2015  . Pericardial effusion 03/25/2015  . Pressure ulcer 04/11/2015  . S/P laparoscopic splenectomy 04/05/2015  . Generalized weakness 03/30/2015  . HLD (hyperlipidemia) 03/30/2015  . Malnutrition of moderate degree ([place]) 03/18/2015  . Chronic diastolic heart failure ([place])   . Lung nodule 03/14/2015  . Adrenal nodule ([place]) 03/14/2015  . MGUS (monoclonal gammopathy of unknown significance) 03/16/2013     Palliative Care Assessment & Plan    Code Status:  Full code  Goals of Care: No changes in CODE STATUS or plan of care at this time: I called and spoke with her daughter, [name] [name], again today following examination of her mother and talking with her bedside nurse about her decrease in blood pressure and change in work of breathing.  I expressed that I remain concerned about her mother's overall prognosis and that this may be the beginning of another decline that results in her mother's death. We discussed again about heroic measures at the end-of-life. I talked specifically about my concern that performing aggressive measures at the time when her mother's heart stops is unlikely to be successful in restarting her heart and if successful will likely result in additional suffering without any realistic expectation she would ever recover enough to leave the critical care unit. Her daughter reports understanding this.  She remains very conflicted over conversation we  had yesterday with her mother when her mother seemed to nod when asked if she would like resuscitation attempted. She also endorses that she is not sure that her mother really understands the conversation that we had. She reports that she will speak with her brother, [name], about everything. She states that they would not like to make any changes to care plan at this time.   Symptom Management:  She does not wake up today to discuss symptom management  Prognosis: Unable to determine due to acute illness, but her condition appears to be clinically worsening today.  Discharge Planning: To be determined.    Care plan was discussed with patient's daughter, and nursing staff.  Thank you for allowing the Palliative Medicine Team to assist in the care of this patient.   Time In: 1620 Time Out: 1645 Total Time 25 Prolonged Time Billed no    Greater than 50%  of this time was spent counseling and coordinating care related to the above assessment and plan.   Micheline Rough, MD  05/04/2015, 8:11 PM  Please contact Palliative Medicine Team phone at [phone] for questions and concerns.

## 2015-05-04 NOTE — Progress Notes (Signed)
Called Dr. Melvyn Novas concerning the pt's work of breathing, respiratory rate and sys BP.  Pt was admin. Two NS boluses - 563ml each.  Vent changes were made to relieve breath stacking.  Continuous midazolam infusion to assist with ventilator compliance.  The palliative team, Dr. Domingo Cocking, rounded on the patient as well and they contacted the daughter to advise her of the change in pt's condition.  The daughter acknowledged understanding, although did not want to consider any changes to the patient's code status.  The daughter also stated that she would contact her brother to update him.  RT will attempt to obtain an ABG.  Will continue to monitor.

## 2015-05-04 NOTE — Progress Notes (Signed)
[facility] for Heparin Indication: Hypercoagulable state / thrombotic vasculopathy  No Known Allergies   Assessment: 80 year old female to begin heparin for possible hypercoagulable state / thrombotic vasculopathy. Heparin has trended up to 0.95 this AM HgB trending down  Day # 8 of Zosyn - consider stopping?  Goal of Therapy:  Heparin level 0.3-0.7 units/ml Monitor platelets by anticoagulation protocol: Yes   Plan:  Decrease Heparin drip to 950 units / hr Heparin level 8 hours after heparin decreased Daily heparin level, CBC      Patient Measurements: Height: 5' 8.5" (174 cm) Weight: 190 lb 7.6 oz (86.4 kg) IBW/kg (Calculated) : 65.05  Vital Signs: Temp: 99.3 F (37.4 C) (10/15 0700) Temp Source: Oral (10/15 0700) BP: 123/89 mmHg (10/15 1100) Pulse Rate: 110 (10/15 1100)  Labs:  Recent Labs  05/02/15 0359  05/02/15 2100 05/03/15 0447 05/03/15 0830 05/03/15 1953 05/04/15 0400 05/04/15 0645  HGB 8.4*  --   --  8.1*  --   --  7.7*  --   HCT 25.7*  --   --  24.5*  --   --  23.1*  --   PLT 127*  --   --  156  --   --  186  --   LABPROT  --   --   --   --  19.9*  --   --  19.7*  INR  --   --   --   --  1.69*  --   --  1.67*  HEPARINUNFRC  --   --   --   --   --  0.73*  --  0.95*  CREATININE  --   < > 1.89* 1.76*  --   --  1.80*  --   CKTOTAL  --   --   --  30*  --   --   --   --   CKMB  --   --   --  4.0  --   --   --   --   < > = values in this interval not displayed.  Estimated Creatinine Clearance: 29.9 mL/min (by C-G formula based on Cr of 1.8).   Thank you Anette Guarneri, PharmD [phone]  05/04/2015 11:37 AM

## 2015-05-05 ENCOUNTER — Inpatient Hospital Stay (HOSPITAL_COMMUNITY): Payer: Medicare Other

## 2015-05-05 DIAGNOSIS — I999 Unspecified disorder of circulatory system: Secondary | ICD-10-CM

## 2015-05-05 LAB — CBC WITH DIFFERENTIAL/PLATELET
BASOS PCT: 0 %
Basophils Absolute: 0 10*3/uL (ref 0.0–0.1)
Eosinophils Absolute: 0 10*3/uL (ref 0.0–0.7)
Eosinophils Relative: 0 %
HEMATOCRIT: 18.4 % — AB (ref 36.0–46.0)
Hemoglobin: 5.8 g/dL — CL (ref 12.0–15.0)
Lymphocytes Relative: 0 %
Lymphs Abs: 0 10*3/uL — ABNORMAL LOW (ref 0.7–4.0)
MCH: 29.1 pg (ref 26.0–34.0)
MCHC: 31 g/dL (ref 30.0–36.0)
MCV: 93.9 fL (ref 78.0–100.0)
MONO ABS: 0 10*3/uL — AB (ref 0.1–1.0)
MONOS PCT: 0 %
NEUTROS PCT: 0 %
Neutro Abs: 0 10*3/uL — ABNORMAL LOW (ref 1.7–7.7)
PLATELETS: 211 10*3/uL (ref 150–400)
RBC: 1.96 MIL/uL — ABNORMAL LOW (ref 3.87–5.11)
RDW: 20.2 % — AB (ref 11.5–15.5)
WBC: 11.9 10*3/uL — ABNORMAL HIGH (ref 4.0–10.5)

## 2015-05-05 LAB — POCT I-STAT 3, VENOUS BLOOD GAS (G3P V)
ACID-BASE DEFICIT: 20 mmol/L — AB (ref 0.0–2.0)
Acid-base deficit: 11 mmol/L — ABNORMAL HIGH (ref 0.0–2.0)
BICARBONATE: 8.8 meq/L — AB (ref 20.0–24.0)
Bicarbonate: 14.8 mEq/L — ABNORMAL LOW (ref 20.0–24.0)
O2 SAT: 30 %
O2 Saturation: 54 %
PCO2 VEN: 29.8 mmHg — AB (ref 45.0–50.0)
PH VEN: 7.072 — AB (ref 7.250–7.300)
PH VEN: 7.31 — AB (ref 7.250–7.300)
PO2 VEN: 25 mmHg — AB (ref 30.0–45.0)
Patient temperature: 96.5
TCO2: 10 mmol/L (ref 0–100)
TCO2: 16 mmol/L (ref 0–100)
pCO2, Ven: 28.9 mmHg — ABNORMAL LOW (ref 45.0–50.0)
pO2, Ven: 29 mmHg — CL (ref 30.0–45.0)

## 2015-05-05 LAB — COMPREHENSIVE METABOLIC PANEL
ALT: 276 U/L — AB (ref 14–54)
AST: 198 U/L — AB (ref 15–41)
Albumin: 1.1 g/dL — ABNORMAL LOW (ref 3.5–5.0)
Alkaline Phosphatase: 120 U/L (ref 38–126)
Anion gap: 16 — ABNORMAL HIGH (ref 5–15)
BUN: 100 mg/dL — AB (ref 6–20)
CHLORIDE: 116 mmol/L — AB (ref 101–111)
CO2: 12 mmol/L — AB (ref 22–32)
CREATININE: 2.15 mg/dL — AB (ref 0.44–1.00)
Calcium: 7.1 mg/dL — ABNORMAL LOW (ref 8.9–10.3)
GFR calc non Af Amer: 21 mL/min — ABNORMAL LOW (ref >60)
GFR, EST AFRICAN AMERICAN: 24 mL/min — AB (ref >60)
Glucose, Bld: 168 mg/dL — ABNORMAL HIGH (ref 65–99)
POTASSIUM: 4.4 mmol/L (ref 3.5–5.1)
Sodium: 144 mmol/L (ref 135–145)
Total Bilirubin: 1.1 mg/dL (ref 0.3–1.2)
Total Protein: <3 g/dL — ABNORMAL LOW (ref 6.5–8.1)

## 2015-05-05 LAB — BLOOD GAS, VENOUS

## 2015-05-05 LAB — HEMOGLOBIN AND HEMATOCRIT, BLOOD
HEMATOCRIT: 30.8 % — AB (ref 36.0–46.0)
Hemoglobin: 10.1 g/dL — ABNORMAL LOW (ref 12.0–15.0)

## 2015-05-05 LAB — FIBRINOGEN
FIBRINOGEN: 117 mg/dL — AB (ref 204–475)
FIBRINOGEN: 210 mg/dL (ref 204–475)

## 2015-05-05 LAB — LACTIC ACID, PLASMA
LACTIC ACID, VENOUS: 5.4 mmol/L — AB (ref 0.5–2.0)
Lactic Acid, Venous: 4.3 mmol/L (ref 0.5–2.0)

## 2015-05-05 LAB — CBC
HEMATOCRIT: 31.8 % — AB (ref 36.0–46.0)
HEMOGLOBIN: 10.6 g/dL — AB (ref 12.0–15.0)
MCH: 28.3 pg (ref 26.0–34.0)
MCHC: 33.3 g/dL (ref 30.0–36.0)
MCV: 85 fL (ref 78.0–100.0)
PLATELETS: 156 10*3/uL (ref 150–400)
RBC: 3.74 MIL/uL — AB (ref 3.87–5.11)
RDW: 17.8 % — ABNORMAL HIGH (ref 11.5–15.5)
WBC: 14.9 10*3/uL — AB (ref 4.0–10.5)

## 2015-05-05 LAB — GLUCOSE, CAPILLARY
GLUCOSE-CAPILLARY: 172 mg/dL — AB (ref 65–99)
Glucose-Capillary: 127 mg/dL — ABNORMAL HIGH (ref 65–99)
Glucose-Capillary: 156 mg/dL — ABNORMAL HIGH (ref 65–99)

## 2015-05-05 LAB — PROTIME-INR
INR: 2.26 — AB (ref 0.00–1.49)
INR: 1.88 — ABNORMAL HIGH (ref 0.00–1.49)
PROTHROMBIN TIME: 24.8 s — AB (ref 11.6–15.2)
Prothrombin Time: 21.5 seconds — ABNORMAL HIGH (ref 11.6–15.2)

## 2015-05-05 LAB — APTT: APTT: 40 s — AB (ref 24–37)

## 2015-05-05 LAB — MAGNESIUM: MAGNESIUM: 2 mg/dL (ref 1.7–2.4)

## 2015-05-05 LAB — PHOSPHORUS: PHOSPHORUS: 6.7 mg/dL — AB (ref 2.5–4.6)

## 2015-05-05 LAB — PREPARE RBC (CROSSMATCH)

## 2015-05-05 MED ORDER — MORPHINE SULFATE 25 MG/ML IV SOLN
1.0000 mg/h | INTRAVENOUS | Status: DC
  Administered 2015-05-05: 5 mg/h via INTRAVENOUS
  Filled 2015-05-05: qty 10

## 2015-05-05 MED ORDER — DEXTROSE 5 % IV SOLN
2.0000 ug/min | INTRAVENOUS | Status: DC
  Administered 2015-05-05: 20 ug/min via INTRAVENOUS
  Filled 2015-05-05: qty 4

## 2015-05-05 MED ORDER — SODIUM CHLORIDE 0.9 % IV BOLUS (SEPSIS)
2000.0000 mL | Freq: Once | INTRAVENOUS | Status: AC
  Administered 2015-05-05: 2000 mL via INTRAVENOUS

## 2015-05-05 MED ORDER — SODIUM CHLORIDE 0.9 % IV SOLN
Freq: Once | INTRAVENOUS | Status: AC
  Administered 2015-05-05: 10 mL/h via INTRAVENOUS

## 2015-05-05 MED ORDER — STERILE WATER FOR INJECTION IV SOLN
INTRAVENOUS | Status: DC
  Administered 2015-05-05 (×2): via INTRAVENOUS
  Filled 2015-05-05 (×5): qty 850

## 2015-05-05 MED ORDER — MORPHINE BOLUS VIA INFUSION
2.0000 mg | INTRAVENOUS | Status: DC | PRN
  Administered 2015-05-05 – 2015-05-06 (×5): 4 mg via INTRAVENOUS
  Filled 2015-05-05 (×6): qty 4

## 2015-05-05 MED ORDER — GLYCOPYRROLATE 0.2 MG/ML IJ SOLN
0.2000 mg | INTRAMUSCULAR | Status: DC | PRN
  Filled 2015-05-05: qty 1

## 2015-05-05 MED ORDER — SODIUM CHLORIDE 0.9 % IV BOLUS (SEPSIS)
1000.0000 mL | Freq: Once | INTRAVENOUS | Status: AC
  Administered 2015-05-05: 1000 mL via INTRAVENOUS

## 2015-05-05 MED ORDER — SODIUM CHLORIDE 0.9 % IV SOLN
Freq: Once | INTRAVENOUS | Status: AC
  Administered 2015-05-05: 09:00:00 via INTRAVENOUS

## 2015-05-05 MED ORDER — SODIUM BICARBONATE 8.4 % IV SOLN
50.0000 meq | Freq: Once | INTRAVENOUS | Status: AC
  Administered 2015-05-05: 50 meq via INTRAVENOUS

## 2015-05-05 MED ORDER — LORAZEPAM 2 MG/ML IJ SOLN
2.0000 mg | INTRAMUSCULAR | Status: DC | PRN
  Administered 2015-05-05 – 2015-05-06 (×3): 2 mg via INTRAVENOUS
  Filled 2015-05-05 (×3): qty 1

## 2015-05-05 NOTE — Progress Notes (Signed)
Pt was bathed.  Dressings were changed, as needed, and additional dressings were placed.

## 2015-05-05 NOTE — Progress Notes (Signed)
ANTIBIOTIC CONSULT NOTE - FOLLOW UP  Pharmacy Consult for Zosyn Indication: pneumonia  No Known Allergies  Labs:  Recent Labs  05/03/15 0447 05/04/15 0400 05/05/15 0144 05/05/15 0500 05/05/15 0905  WBC 12.8* 11.7* 11.9*  --  14.9*  HGB 8.1* 7.7* 5.8* 10.1* 10.6*  PLT 156 186 211  --  156  CREATININE 1.76* 1.80* 2.15*  --   --    Assessment: 80 year old female continue on Zosyn (Day # 9) for HCAP/sepsis Initally, found to have cardiac tamponade and history of breast cancer with ? Metastatic disease Scr continues to rise, afebrile, WBC = 14.9 Overall prognosis poor  Goal of Therapy:  Appropriate dosing  Plan:  Continue Zosyn 3.375 grams iv Q 8 hours (4 hr infusion) Usually 7 day course of antibiotics is appropriate length for sepsis/HCAP-consider dc?  Thank you Anette Guarneri, PharmD [phone]  05/05/2015,12:37 PM

## 2015-05-05 NOTE — Progress Notes (Signed)
eLink Physician-Brief Progress Note Patient Name: Janean Eischen DOB: [birth year] MRN: [id number]   Date of Service  05/05/2015  HPI/Events of Note  Notified by RN that patient acidotic on VBG. Ventilation adequate.  eICU Interventions  Bicarb bolus & start infusion.     Intervention Category Major Interventions: Acid-Base disturbance - evaluation and management  Tera Partridge 05/05/2015, 2:37 AM

## 2015-05-05 NOTE — Progress Notes (Signed)
eLink Physician-Brief Progress Note Patient Name: [name] [name] DOB: 04-27-1935 MRN: [id number]   Date of Service  05/05/2015  HPI/Events of Note  Called by RN regarding hypotension.  eICU Interventions  NS bolus. Check LA & Hgb/Hct.     Intervention Category Major Interventions: Hypotension - evaluation and management  Tera Partridge 05/05/2015, 12:14 AM

## 2015-05-05 NOTE — Progress Notes (Signed)
PULMONARY / CRITICAL CARE MEDICINE   Name: [name] [name] MRN: [id number] DOB: 1935/04/21    ADMISSION DATE:  04/04/2015  LOS 21 days   REFERRING MD :  [facility]  CHIEF COMPLAINT:  Cough  INITIAL PRESENTATION:  80 yo admitted with cough and fever from sepsis and HCAP.  She was found to have cardiac tamponade.  She has hx of breast cancer with possible metastatic disease, MGUS.  STUDIES/EVENT:  02/20/15 - bone marrow - predom CD8 cells 04/03/15 - Left adrenal bx  - extensive necrosis  04/05/15 - splenectomy bx -  ...Marland KitchenMarland KitchenMarland Kitchen 09/25  Admit, oncology consult 9/26  Thoracentesis  9/26  CT chest >> b/l pleural effusions, large pericardial effusion, 2.3 cm RLL nodule (was 1.8 cm) 9/26  ECHO >> tamponade  09/26  Cardiology consulted 09/27  TCTS consulted >> subxyphoid pericardial window. PERICARDIAL BIOPSY - NO EVIDENCE OF MALIGNANCY 09/28  Extubated 10/116 - echo - nromal EF 10/02  Fever, altered mental status >>To ICU. Intubated 10/03  NSVT and torsades.  10/6  MRI Head >> negative for acute pathology  10/06  Extubated  10/07  Reintubated for hypoxemia and AMS. AUTOIMMUNE NEGATIVE (ds dna, ana, rF, ANCA, Ig, ssa, ssB, scl70) 04/28/15: : Reintubated overnight, mottled overnight with high pressor demand. 04/29/15: Off levophed.extensive dermal necrosis all over body.  04/30/15 - LEFT ANTERIOR THIGH BX ->creat rising to 1.9   05/01/15 - no clear evidence of malignancy per Dr Marin Olp notes. Palliative care today at 66/06 per Elmo Putt of pallitive. PEr rN no overnight events.   05/02/15: derm bx - prelim d./w DR Caryl Ada  thrombotic changes in vessels. No evidence of vasculitys. 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 DIF negative - so Non IgA. Not leukocytoclastvic vasculitis. Ddx -  Lupus anticoag, Cyroglobulinemia,  HUS-TTP, Early DIC (doubt due to lack of epidremal necrosis). No evidence of vasculitis . No evidence of Wegners. No neutrophils seen. Dr Tamala Julian feel patient is necrosing  but not sure why. Per renal note 05/02/15 : "Low complements on 05/01/15 and unable to perform cryoglobulin due to the fact her blood would not separate in centrifuge"    SUBJECTIVE/OVERNIGHT/INTERVAL HX Afebrile Acidotic overnight, on bicarb gtt now Off heparin gtt due to Hb drop  UO decreasing, POS 7L   VITAL SIGNS: Temp:  [96 F (35.6 C)-98.5 F (36.9 C)] 96.1 F (35.6 C) (10/16 0530) Pulse Rate:  [34-118] 76 (10/16 0715) Resp:  [10-37] 21 (10/16 0715) BP: (60-137)/(18-102) 98/47 mmHg (10/16 0715) SpO2:  [82 %-100 %] 100 % (10/16 0715) FiO2 (%):  [40 %-100 %] 60 % (10/16 0615) Weight:  [88.905 kg (196 lb)] 88.905 kg (196 lb) (10/16 0615)   HEMODYNAMICS: CVP:  [4 mmHg-19 mmHg] 8 mmHg   VENTILATOR SETTINGS: Vent Mode:  [-] PRVC FiO2 (%):  [40 %-100 %] 60 % Set Rate:  [16 bmp-22 bmp] 16 bmp Vt Set:  [390 mL-500 mL] 500 mL PEEP:  [5 cmH20] 5 cmH20 Plateau Pressure:  [17 cmH20-26 cmH20] 25 cmH20   INTAKE / OUTPUT:  Intake/Output Summary (Last 24 hours) at 05/05/15 0731 Last data filed at 05/05/15 0700  Gross per 24 hour  Intake 9724.95 ml  Output   2115 ml  Net 7609.95 ml   PHYSICAL EXAMINATION: General: chronically critically ill appearing female  Neuro:  RASS  -1 Cardiovascular:  S1, S2, No MRG. Lungs:  Even/non-labored, coarse BS diffusely. Abdomen:  Soft, non tender, BSx4 active  Musculoskeletal:  No acute deformities Skin:  Extensive ecchymotic/ necrosis  like lesions across forearm, thigh and even nose - unchanged since 05/02/15, ischemic toes   LABS:  PULMONARY  Recent Labs Lab 04/28/15 1520 04/29/15 0405 05/05/15 0229  PHART 7.332* 7.330*  --   PCO2ART 38.8 39.6  --   PO2ART 229.0* 194*  --   HCO3 20.6 20.3 8.8*  TCO2 22 21.5 10  O2SAT 100.0 99.2 30.0    CBC  Recent Labs Lab 05/03/15 0447 05/04/15 0400 05/05/15 0144 05/05/15 0500  HGB 8.1* 7.7* 5.8* 10.1*  HCT 24.5* 23.1* 18.4* 30.8*  WBC 12.8* 11.7* 11.9*  --   PLT 156 186 211  --      COAGULATION  Recent Labs Lab 04/30/15 0500 05/03/15 0830 05/04/15 0645 05/05/15 0500  INR 1.29 1.69* 1.67* 2.26*    CARDIAC  No results for input(s): TROPONINI in the last 168 hours. No results for input(s): PROBNP in the last 168 hours.   CHEMISTRY  Recent Labs Lab 05/01/15 0400  05/02/15 0400 05/02/15 0753 05/02/15 2100 05/03/15 0447 05/04/15 0400 05/05/15 0144  NA 141  < > 147* 148* 141 141 137 144  K 2.8*  < > 3.0* 2.9* 3.0* 3.0* 2.9* 4.4  CL 108  < > 111 111 108 111 106 116*  CO2 22  < > 22 23 22  21* 19* 12*  GLUCOSE 130*  < > 155* 171* 179* 186* 193* 168*  BUN 64*  < > 74* 76* 78* 79* 89* 100*  CREATININE 1.99*  < > 1.92* 1.99* 1.89* 1.76* 1.80* 2.15*  CALCIUM 7.5*  < > 7.9* 8.0* 7.4* 7.3* 7.2* 7.1*  MG 2.0  --  2.0  --   --  1.9 2.0 2.0  PHOS 3.8  --  3.9  --   --  3.8 4.4 6.7*  < > = values in this interval not displayed. Estimated Creatinine Clearance: 25.4 mL/min (by C-G formula based on Cr of 2.15).   LIVER  Recent Labs Lab 04/30/15 0500 05/02/15 0400 05/03/15 0447 05/03/15 0830 05/04/15 0400 05/04/15 0645 05/05/15 0144 05/05/15 0500  AST  --   --  73*  --  78*  --  198*  --   ALT  --   --  107*  --  128*  --  276*  --   ALKPHOS  --   --  147*  --  137*  --  120  --   BILITOT  --   --  0.7  --  0.8  --  1.1  --   PROT  --   --  3.4*  --  3.2*  --  <3.0*  --   ALBUMIN  --  1.2* 1.3*  --  1.2*  --  1.1*  --   INR 1.29  --   --  1.69*  --  1.67*  --  2.26*     INFECTIOUS  Recent Labs Lab 05/03/15 0430 05/04/15 0645 05/05/15 0143  LATICACIDVEN 2.5* 3.0* 5.4*     ENDOCRINE CBG (last 3)   Recent Labs  05/04/15 1621 05/04/15 1957 05/04/15 2331  GLUCAP 173* 127* 172*         IMAGING x48h  - image(s) personally visualized  -   highlighted in bold Dg Chest Port 1 View  05/04/2015  CLINICAL DATA:  ETT EXAM: PORTABLE CHEST 1 VIEW COMPARISON:  05/03/2015 FINDINGS: Endotracheal tube terminates 6 cm above the carina. Left  chest port, without visualized pneumothorax. Small to moderate layering right pleural effusion. Left IJ  venous catheter terminates in the lower SVC. Enteric tube courses into the stomach. The heart is normal in size. IMPRESSION: Endotracheal tube terminates cm above the carina. Left chest port, without visualized pneumothorax. Associated stable support apparatus as above. Electronically Signed   By: Julian Hy M.D.   On: 05/04/2015 07:14       ASSESSMENT / PLAN:  PULMONARY A: Acute hypoxic respiratory failure. Rt lung nodule. Pleural effusions - thora 9/26 with negative cultures ETT 10/2>>>10/6>>>10/7>>>   P:   No SBT  Oxygen as needed to support saturations > 92%  CARDIOVASCULAR Lt IJ CVL 9/27>>>  A:  Pericardial effusion with tamponade s/p window 9/27 with resultant chest tube. Nondiagnostic bx Hx of diastolic CHF, CAD, HLD. NSVT and torsades - none since 10/3 On hydrocort since 04/27/15 plasma cortisol of 0.6, definitely adrenally insufficient. New shock/acidosis 10/16   - off pressors since 04/29/15 AM  P:  Resume levo gtt Chest tube management per CVTS  RENAL A:   AKI. -  Worsening NAG acidosis  P:   Bicarb gtt Replace electrolytes as needed.  Avoid nephrotoxic drugs   GASTROINTESTINAL A:   Protein cal malnutrition.  P:   Hold Tube feeds -high residuals, overall status worse PPI - home medication.  HEMATOLOGIC A:   Hx of splenectomy, MGUS, breast cancer - was on EVISTA. Thrombocytopenia - may be secondary to stress, sepsis Anemia Coagulopathy -DIC possible  P:  Hold Evista since 05/02/15 due to vasculopathy on skin bx Dr Marin Olp following Monitor for bleeding -2U PRBC SCD's for DVT prophylaxis  Dc heparin - cryo 1 unit  INFECTIOUS Blood 10/02 >> NTD Urine 10/02 >> NTD Sputum 10/02 >> normal flora  Blood 10/8>>>ng Sputum 10/8>>>ng   A:   HCAP - possible . No clear evidence of sepsis P:   Zosyn 10/8>>>  Vanc start date unclear  to me>>> 05/01/2015 (MRSA PCR negative, culture negative and rising creat)   ENDOCRINE A:   Adrenal insufficiency. Necrotic adrenals seen on recent biopsy P:   Hydrocortisone -> change to Solumedrol 05/02/15 (see autoimmnune)  AUTOIMMUNE ANCA >> NEG DS DNA >> neg SCL70 >> neg RF >> 30 SSA/SSB >> neg IgG low IgA normal Complement 05/01/15 - low Crygolobulin 05/01/15 - could not do because blood would not separate in centrifuge due to low complement Repeat cryoglobumin 05/02/15   A: Ongoing skin lesions + 04/30/15 - Bx - NO VASCULITIS but has SMALL and MID VESSEL THROMBOSIS -  - ddx TTP-HUS, crygolobulinemia, lupus anticoag, drugs  duplex UE - 05/03/15 neg, LEG unable to interpret P:  Hold EVISTA breast cancer prophyalxis - has black box warning for dvt (on hold since 05/02/15) Await  Cryoglobulin repeat 05/02/15 Change hydrocort to solumedrol 05/02/15 >> dc heparin   NEUROLOGIC A:   Acute encephalopathy  P:   Prn fent and versed Hold lexapro   ONCOLOGY A: No evidence of ongoing current malignancy . There is only hx of remote breast cancer > 20 years ago - see Dr Marin Olp notes 04/30/15      Summary   -Worsening shock Etiology of purpura fulminans unclear , vasculitis possible  Per Dr Marin Olp 05/01/15 notes- no terminal diagnosis found but  Prognosis very poor Palliative conversations noted,   The patient is critically ill with multiple organ systems failure and requires high complexity decision making for assessment and support, frequent evaluation and titration of therapies, application of advanced monitoring technologies and extensive interpretation of multiple databases. Critical Care Time devoted to patient  care services described in this note independent of APP time is 35 minutes.   Kara Mead MD. [place]. [facility] Pager [phone] If no response call [phone]     05/05/2015 7:31 AM

## 2015-05-05 NOTE — Progress Notes (Signed)
Daily Progress Note   Patient Name: [name] [name]       Date: 05/05/2015 DOB: 09-13-1934  Age: 80 y.o. 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 She was then readmitted on 04/12/2015 with HCAP and found to have cardiac tamponade s/p pericardial window on 03/25/2015. 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 Off of pressors since 04/30/15.   Interval Events:  Dr. Elsworth Soho and I met with [name] [name]'s family including her daughter, [name] [name], her son, [name] [name], [name] [name], [name] [name] and her granddaughter, [name] [name].  We began conversation by reviewing our previous conversations about [name] [name] and that the most important things to her were spending time with her family, being involved in her church, and being independent at home.  We talked about their understanding of her clinical course to this point in time.  Dr. Elsworth Soho spoke with her daughter this morning.  He reviewed the information that he discussed this morning, updated family on most recent events, and answered questions regarding her current clinical situation.  We discussed paths moving forward including continuing current care, continuing current care for another 24-48 hours while placing limits on any escalation in care, or  withdrawing care with the intent of allowing natural death.  We discussed what each of these paths may look like with focus being on likelihood of returning to a state that she would find to be an acceptable quality of life.  Her family then asked for time to discuss options. They came and contacted me afterward and reported that after their discussion they have made a decision that the wishes of [name] [name] were she to understand her situation would to be withdraw care and allow natural death. They asked that we work toward this goal as soon as possible.  I called and spoke with Dr. Elsworth Soho who was in agreement that this was reasonable plan moving forward. I changed her CODE STATUS to DO NOT RESUSCITATE and Dr. Elsworth Soho will place further orders for withdrawing care.    Length of Stay: 21 days  Current Medications: Scheduled Meds:  . antiseptic oral rinse  7 mL Mouth Rinse QID  . chlorhexidine gluconate  15 mL Mouth Rinse BID  . folic acid  2 mg Per Tube Daily  . free water  200 mL Per Tube 3 times per day  . Gerhardt's butt cream   Topical Daily  . insulin aspart  0-9 Units Subcutaneous 6 times per day  . methylPREDNISolone (SOLU-MEDROL) injection  125 mg Intravenous Q6H  . multivitamin  5 mL Oral Daily  . pantoprazole sodium  40 mg Per Tube Daily  . piperacillin-tazobactam (ZOSYN)  IV  3.375 g Intravenous Q8H  . sodium chloride  1,000 mL Intravenous Once  . sodium chloride  500 mL Intravenous Once  . sodium chloride  10-40 mL Intracatheter Q12H    Continuous Infusions: . sodium chloride 10 mL/hr at 05/05/15 0700  . midazolam (VERSED) infusion Stopped (05/05/15 0100)  .  norepinephrine (LEVOPHED) Adult infusion Stopped (05/05/15 1100)  .  sodium bicarbonate 150 mEq in sterile water 1000 mL infusion 150 mL/hr at 05/05/15 1150    PRN Meds: fentaNYL (SUBLIMAZE) injection, levalbuterol, potassium chloride, potassium chloride, sodium chloride  Palliative Performance Scale: 10%     Vital  Signs: BP 98/20 mmHg  Pulse 93  Temp(Src) 96.6 F (35.9 C) (Axillary)  Resp 30  Ht 5' 8.5" (1.74 m)  Wt 88.905 kg (196 lb)  BMI 29.36 kg/m2  SpO2 100% SpO2: SpO2: 100 % O2 Device: O2 Device: Ventilator O2 Flow Rate: O2 Flow Rate (L/min): 3 L/min  Intake/output summary:   Intake/Output Summary (Last 24 hours) at 05/05/15 1410 Last data filed at 05/05/15 1300  Gross per 24 hour  Intake 9582.03 ml  Output   2340 ml  Net 7242.03 ml   LBM:   Baseline Weight: Weight: 66.6 kg (146 lb 13.2 oz) Most recent weight: Weight: 88.905 kg (196 lb)  Physical Exam: General appearance: cachectic and intubated, does not arouse to verbal or tactile stimulation Head: Normocephalic, without obvious abnormality, atraumatic Resp: rhonchi bilaterally Cardio: Regular, no murmur GI: soft, non-tender; bowel sounds normal; no masses, no organomegaly Extremities: covered in diffuse erythemetous rash with yellowish bullae involving the lower extremities and toes as well as chest and arms                Additional Data Reviewed: Recent Labs     05/04/15  0400  05/05/15  0144  05/05/15  0500  05/05/15  0905  WBC  11.7*  11.9*   --   14.9*  HGB  7.7*  5.8*  10.1*  10.6*  PLT  186  211   --   156  NA  137  144   --    --   BUN  89*  100*   --    --   CREATININE  1.80*  2.15*   --    --      Problem List:  Patient Active Problem List   Diagnosis Date Noted  . Goals of care, counseling/discussion   . Palliative care encounter   . Vasculopathy 05/03/2015  . AKI (acute kidney injury) ([place]) 05/01/2015  . Paroxysmal ventricular tachycardia ([place]) 04/22/2015  . Altered mental status 04/21/2015  . Acute pulmonary edema ([place]) 04/21/2015  . Sinusitis, acute maxillary 04/21/2015  . Septic shock ([place])   . Chest tube in place   . Endotracheally intubated   . Pleural effusion   . Lung nodules   . Pleural effusion on right   . Pericardial tamponade 04/15/2015  . HCAP (healthcare-associated  pneumonia) 04/06/2015  . Sepsis due to pneumonia ([place]) 04/08/2015  . Acute respiratory failure with hypoxia ([place]) 04/07/2015  . Hypokalemia 04/05/2015  . Pericardial effusion 03/28/2015  . Pressure ulcer 04/04/2015  . S/P laparoscopic splenectomy 04/05/2015  . Generalized weakness 03/30/2015  . HLD (hyperlipidemia) 03/30/2015  . Malnutrition of moderate degree ([place]) 03/18/2015  . Chronic diastolic heart failure ([place])   . Lung nodule 03/14/2015  . Adrenal nodule ([place]) 03/14/2015  . MGUS (monoclonal gammopathy of unknown significance) 03/16/2013     Palliative Care Assessment & Plan    Code Status:  DNR  Goals of Care:  Following long conversation about possible paths forward, her family has elected to withdraw care as they believe this would be in line with the patient's prior expressed wishes for a natural death. I spoke with Dr. Elsworth Soho who is in agreement we'll  place orders for withdrawal. Please see above for further details of conversation.   Prognosis: Hours - Days  Discharge Planning: This will be a terminal admission.    Care plan was discussed with patient's family, Dr. Elsworth Soho, and nursing staff.  Thank you for allowing the Palliative Medicine Team to assist in the care of this patient.   Time In: 1300 Time Out: 1420 Total Time 80 Prolonged Time Billed yes    Greater than 50%  of this time was spent counseling and coordinating care related to the above assessment and plan.   Micheline Rough, MD  05/05/2015, 2:10 PM  Please contact Palliative Medicine Team phone at [phone] for questions and concerns.

## 2015-05-05 NOTE — Progress Notes (Signed)
Pt was bathed in the beginning of the shift and all dressings were changed, with some new dressings added.  At 1700, the pt's disposable and linen chucks were changed with dressings being changed as needed.

## 2015-05-05 NOTE — Progress Notes (Signed)
eLink Physician-Brief Progress Note Patient Name: [name] [name] DOB: [birth year] MRN: [id number]   Date of Service  05/05/2015  HPI/Events of Note  Notified by RN of critical anemia on heparin gtt. Patient has further hypotension and lactic acid elevated.  eICU Interventions  Stop heparin gtt. 2L NS bolus. 2u PRBCs . Post transfusion coags & Hgb/Hct. VBG & trend LA.     Intervention Category Major Interventions: Hypotension - evaluation and management  Tera Partridge 05/05/2015, 2:23 AM

## 2015-05-05 NOTE — Procedures (Signed)
Extubation Procedure Note  Patient Details:   Name: Tamya Denardo DOB: 07-05-1935 MRN: [id number]   Airway Documentation:  Airway 7.5 mm (Active)  Secured at (cm) 23 cm 05/05/2015 12:39 PM  Measured From Lips 05/05/2015 12:39 PM  Secured Location Left 05/05/2015 12:39 PM  Secured By [employer] 05/05/2015 12:39 PM  Tube Holder Repositioned Yes 05/05/2015 12:39 PM  Cuff Pressure (cm H2O) 22 cm H2O 05/05/2015  7:51 AM  Site Condition Dry 05/05/2015 12:39 PM  Extubated to room air   Evaluation  O2 sats: terminal  Complications: No apparent complications Patient did not tolerate procedure well. Bilateral Breath Sounds: Diminished, Expiratory wheezes, Fine crackles Suctioning: Airway No  Donella Stade 05/05/2015, 5:41 PM

## 2015-05-06 LAB — TYPE AND SCREEN
ABO/RH(D): B POS
ANTIBODY SCREEN: NEGATIVE
Unit division: 0
Unit division: 0
Unit division: 0

## 2015-05-06 LAB — PREPARE CRYOPRECIPITATE: Unit division: 0

## 2015-05-06 LAB — LUPUS ANTICOAGULANT PANEL
DRVVT: 35.4 s (ref 0.0–55.1)
PTT Lupus Anticoagulant: 23.8 s (ref 0.0–50.0)

## 2015-05-07 ENCOUNTER — Telehealth: Payer: Self-pay

## 2015-05-07 LAB — PROTEIN C ACTIVITY: Protein C Activity: 14 % — ABNORMAL LOW (ref 74–151)

## 2015-05-07 NOTE — Telephone Encounter (Signed)
On 05/07/2015 I received a death certificate from [facility] at [facility]. The death certificate is for burial. The patient is a patient of Doctor Elsworth Soho. The death certificate will be taken to the pulmonary unit this am for signature.  On 05-19-2015 I received the death certificate back from Doctor Elsworth Soho. I got the death certificate ready for pickup and called the funeral home to let them know it was ready for pickup.

## 2015-05-15 LAB — FUNGUS CULTURE W SMEAR
Fungal Smear: NONE SEEN
Fungal Smear: NONE SEEN
Fungal Smear: NONE SEEN

## 2015-05-21 NOTE — Discharge Summary (Signed)
PULMONARY / CRITICAL CARE MEDICINE   Name: [name] [name] MRN: [id number] DOB: 01/12/1935    ADMISSION DATE:  04/11/2015  LOS 22 days   REFERRING MD :  [facility]  CHIEF COMPLAINT:  Cough  INITIAL PRESENTATION:  80 yo admitted with cough and fever from sepsis and HCAP.  She was found to have cardiac tamponade.  She has hx of breast cancer with possible metastatic disease, MGUS.  STUDIES/EVENT:  02/20/15 - bone marrow - predom CD8 cells 04/03/15 - Left adrenal bx  - extensive necrosis  04/05/15 - splenectomy bx -  ...Marland KitchenMarland KitchenMarland Kitchen 09/25  Admit, oncology consult 9/26  Thoracentesis  9/26  CT chest >> b/l pleural effusions, large pericardial effusion, 2.3 cm RLL nodule (was 1.8 cm) 9/26  ECHO >> tamponade  09/26  Cardiology consulted 09/27  TCTS consulted >> subxyphoid pericardial window. PERICARDIAL BIOPSY - NO EVIDENCE OF MALIGNANCY 09/28  Extubated 10/116 - echo - nromal EF 10/02  Fever, altered mental status >>To ICU. Intubated 10/03  NSVT and torsades.  10/6  MRI Head >> negative for acute pathology  10/06  Extubated  10/07  Reintubated for hypoxemia and AMS. AUTOIMMUNE NEGATIVE (ds dna, ana, rF, ANCA, Ig, ssa, ssB, scl70) 04/28/15: : Reintubated overnight, mottled overnight with high pressor demand. 04/29/15: Off levophed.extensive dermal necrosis all over body.  04/30/15 - LEFT ANTERIOR THIGH BX ->creat rising to 1.9   05/01/15 - no clear evidence of malignancy per Dr Marin Olp notes. Palliative care today at 54/56 per Elmo Putt of pallitive. PEr rN no overnight events.   05/02/15: derm bx - prelim d./w DR Caryl Ada  thrombotic changes in vessels. No evidence of vasculitys. 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 DIF negative - so Non IgA. Not leukocytoclastvic vasculitis. Ddx -  Lupus anticoag, Cyroglobulinemia,  HUS-TTP, Early DIC (doubt due to lack of epidremal necrosis). No evidence of vasculitis . No evidence of Wegners. No neutrophils seen. Dr Tamala Julian feel patient is necrosing  but not sure why. Per renal note 05/02/15 : "Low complements on 05/01/15 and unable to perform cryoglobulin due to the fact her blood would not separate in centrifuge"   ASSESSMENT / PLAN:  PULMONARY A: Acute hypoxic respiratory failure. Rt lung nodule. Pleural effusions - thora 9/26 with negative cultures ETT 10/2>>>10/6>>>10/7>>>   P:   Vent bundle  CARDIOVASCULAR Lt IJ CVL 9/27>>>  A:  Pericardial effusion with tamponade s/p window 9/27 with resultant chest tube. Nondiagnostic bx Hx of diastolic CHF, CAD, HLD. NSVT and torsades - none since 10/3 On hydrocort since 04/27/15 plasma cortisol of 0.6, definitely adrenally insufficient. New shock/acidosis 10/16   P:  Chest tube management per CVTS  RENAL A:   AKI. -  Worsening NAG acidosis  P:   Bicarb gtt Replace electrolytes as needed.  Avoid nephrotoxic drugs   HEMATOLOGIC A:   Hx of splenectomy, MGUS, breast cancer - was on EVISTA. Thrombocytopenia - may be secondary to stress, sepsis Anemia Coagulopathy -DIC possible  P:  Hold Evista since 05/02/15 due to vasculopathy on skin bx Dr Marin Olp following Monitor for bleeding -2U PRBC SCD's for DVT prophylaxis  Dc heparin - cryo 1 unit  INFECTIOUS Blood 10/02 >> NTD Urine 10/02 >> NTD Sputum 10/02 >> normal flora  Blood 10/8>>>ng Sputum 10/8>>>ng   A:   HCAP - possible . No clear evidence of sepsis P:   Zosyn 10/8>>>  Vanc start date unclear to me>>> 05/01/2015 (MRSA PCR negative, culture negative and rising creat)   ENDOCRINE A:  Adrenal insufficiency. Necrotic adrenals seen on recent biopsy P:   Hydrocortisone -> change to Solumedrol 05/02/15 (see autoimmnune)  AUTOIMMUNE ANCA >> NEG DS DNA >> neg SCL70 >> neg RF >> 30 SSA/SSB >> neg IgG low IgA normal Complement 05/01/15 - low Crygolobulin 05/01/15 - could not do because blood would not separate in centrifuge due to low complement Repeat cryoglobumin 05/02/15   A: Ongoing skin  lesions + 04/30/15 - Bx - NO VASCULITIS but has SMALL and MID VESSEL THROMBOSIS -  - ddx TTP-HUS, crygolobulinemia, lupus anticoag, drugs  duplex UE - 05/03/15 neg, LEG unable to interpret P:  Hold EVISTA breast cancer prophyalxis - has black box warning for dvt (on hold since 05/02/15) Await  Cryoglobulin repeat 05/02/15 Change hydrocort to solumedrol 05/02/15 >> dc heparin   NEUROLOGIC A:   Acute encephalopathy  P:   Prn fent and versed Hold lexapro   ONCOLOGY A: No evidence of ongoing current malignancy . There is only hx of remote breast cancer > 20 years ago - see Dr Marin Olp notes 04/30/15      Summary   -Worsening shock Etiology of purpura fulminans unclear , vasculitis possible  Per Dr Marin Olp 05/01/15 notes- no terminal diagnosis found but  Prognosis very poor Palliative conversations- life support withdrawn May 30, 2023 & she passed away   Cause of death -  Septic shock, AKI, vasculitis, Acute resp failure  Kara Mead MD. FCCP. [facility] Pager [phone] If no response call [phone]     05/09/2015 2:26 PM

## 2015-05-21 NOTE — Progress Notes (Addendum)
Morphine drip wasted by 2 RN's)   Wasted 100  cc's(Alan Currin and [place])

## 2015-05-21 NOTE — Progress Notes (Addendum)
Pt confirmed to have no heart rate or respiratory rate by 2 RN's (Alan Currin and [employer])

## 2015-05-21 DEATH — deceased

## 2015-05-29 LAB — AFB CULTURE WITH SMEAR (NOT AT ARMC)
Acid Fast Smear: NONE SEEN
Acid Fast Smear: NONE SEEN
Acid Fast Smear: NONE SEEN

## 2016-08-25 IMAGING — DX DG CHEST 1V PORT
1 series · 1 of 1 positions shown · non-contrast
Comparison: 04/18/2015

CLINICAL DATA: Pericardial effusion

EXAM:
PORTABLE CHEST 1 VIEW

[chest ap]
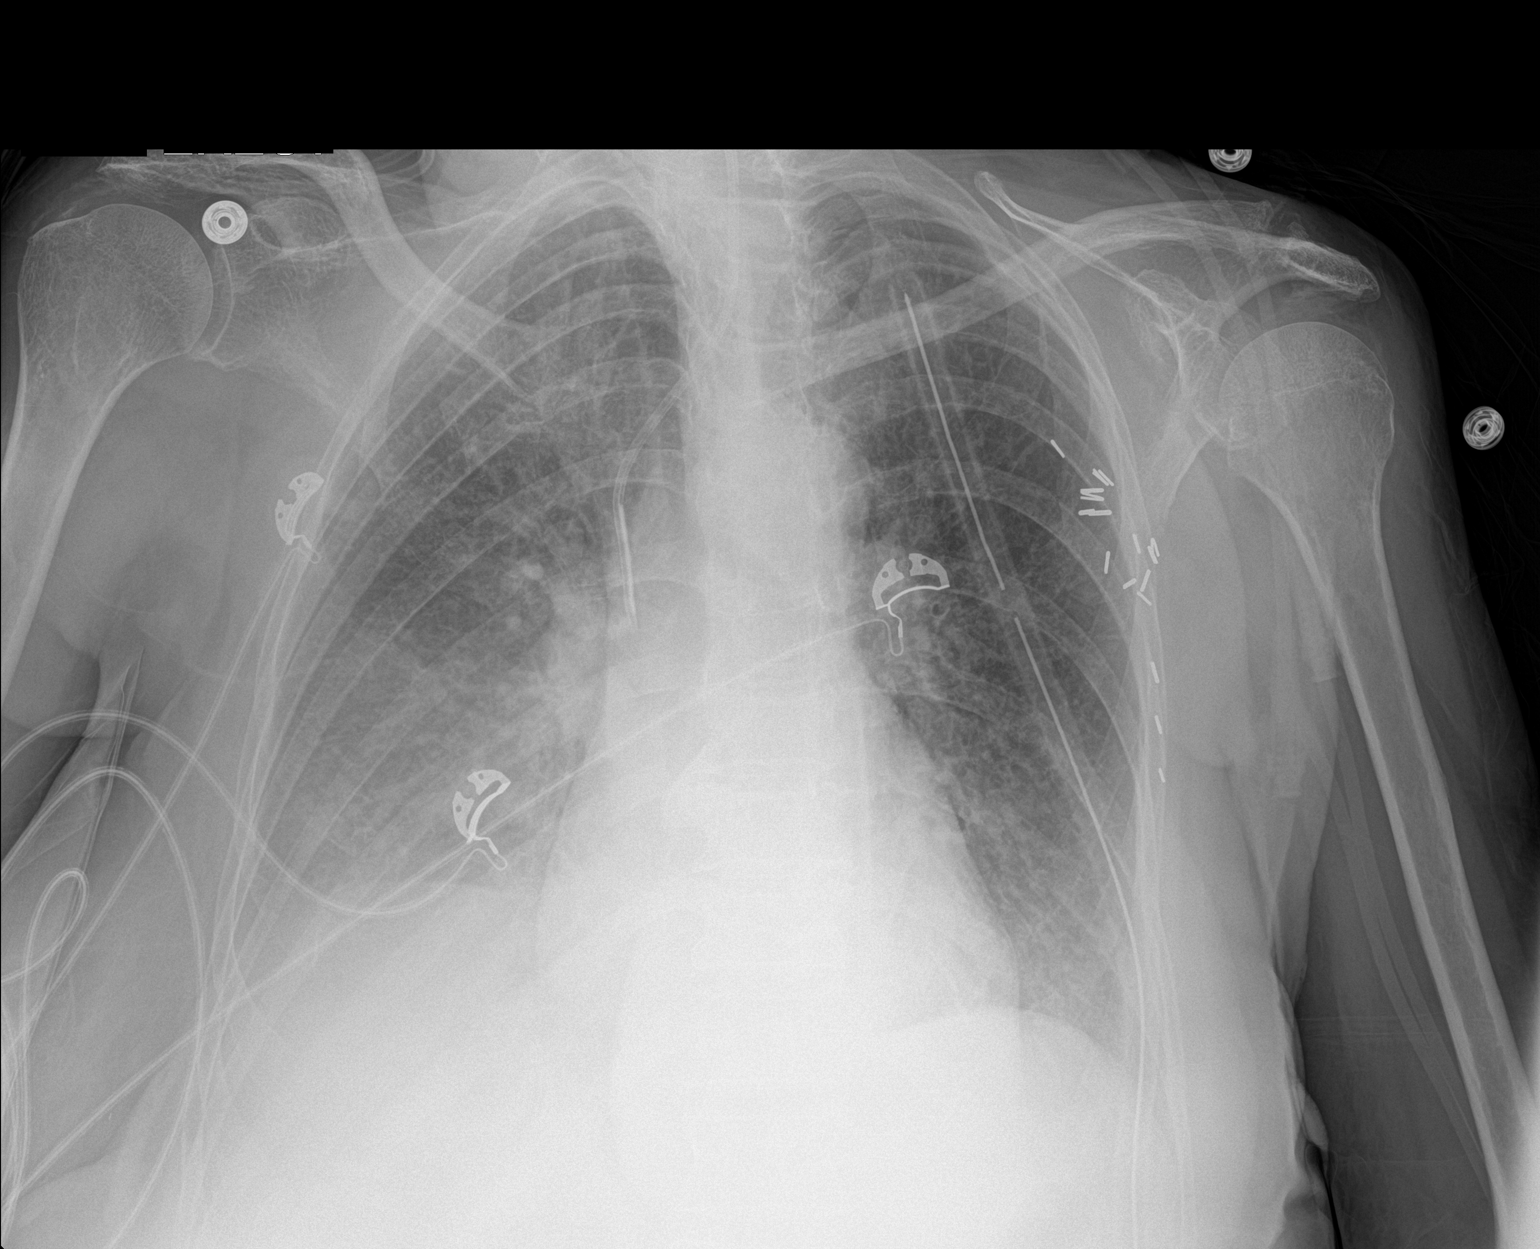

[1 of 1 positions shown; findings below may reference images not displayed]

FINDINGS: Left chest tube remains in place. No pneumothorax. Left jugular
central venous catheter tip in the SVC.

Interval development of bilateral airspace disease right greater
than left. Interval development of right pleural effusion. Probable
congestive heart failure with edema. Pericardial drain has been
removed.

Progression of bibasilar atelectasis right greater than left.
IMPRESSION: Pericardial drain removed. No pneumothorax. Left chest tube remains
in place.

Interval development of bilateral airspace disease and right pleural
effusion most consistent with pulmonary edema.

## 2016-08-26 IMAGING — CR DG CHEST 1V PORT
1 series · 1 of 1 positions shown · non-contrast
Comparison: 04/21/2015 and CT chest 04/15/2015.

CLINICAL DATA: Sub xiphoid pericardial window, chest tube
insertion, transesophageal echocardiogram, pericardial effusion with
tamponade.

EXAM:
PORTABLE CHEST 1 VIEW

[AP]
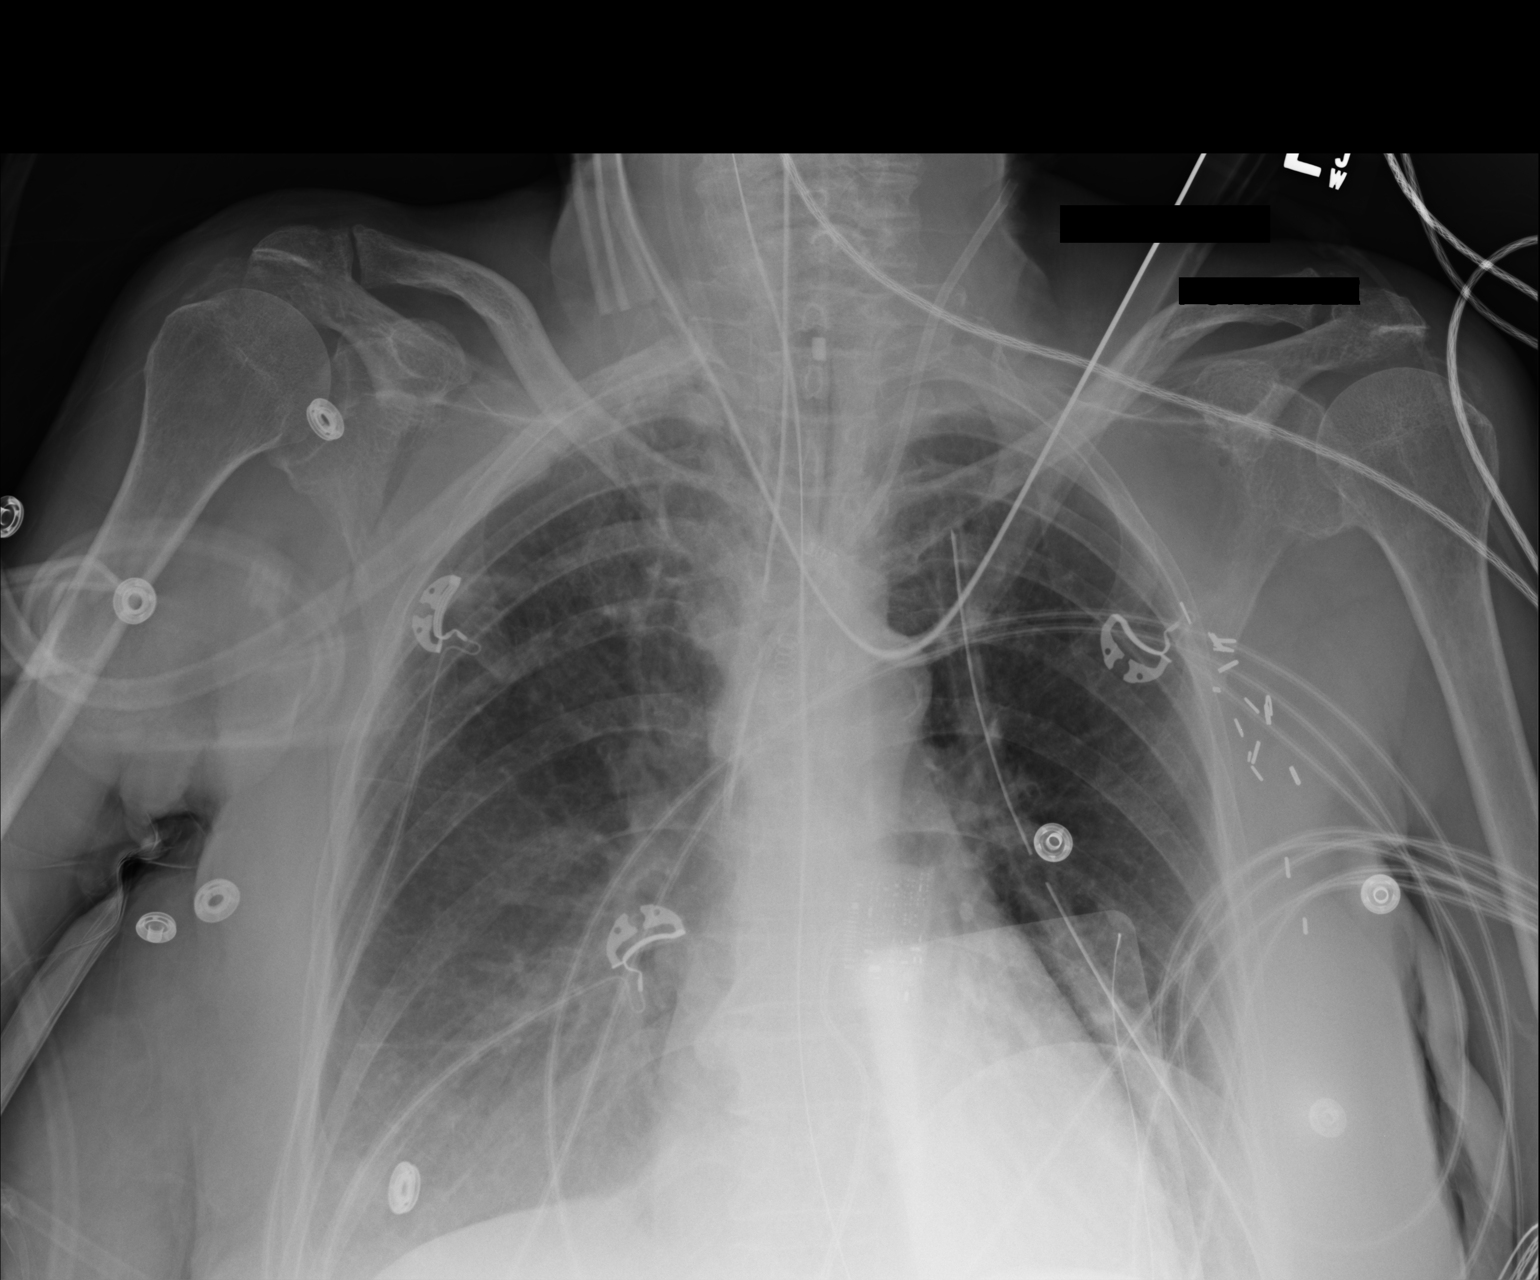

[1 of 1 positions shown; findings below may reference images not displayed]

FINDINGS: Endotracheal tube terminates approximately 4.3 cm above the carina.
Nasogastric tube is followed into the stomach. Left chest tube
terminates the upper left hemi thorax. Defibrillator pads overlie
the lower left chest.

Mild bibasilar airspace opacification. Minimal biapical pleural
thickening. No airspace consolidation. Costophrenic angles were not
included on the image. Haziness at the base the right hemithorax may
be due to layering pleural fluid. No pneumothorax.

Surgical clips in the left axillary region.
IMPRESSION: 1. No definite pneumothorax with left chest tube in place.
2. Mild bibasilar airspace opacification, possibly due to
atelectasis.
3. Possible small layering right pleural effusion.

## 2016-08-27 IMAGING — CR DG CHEST 1V PORT
1 series · 1 of 1 positions shown · non-contrast
Comparison: Chest radiograph performed 04/22/2015

CLINICAL DATA: Acute onset of respiratory distress. Initial
encounter.

EXAM:
PORTABLE CHEST 1 VIEW

[AP]
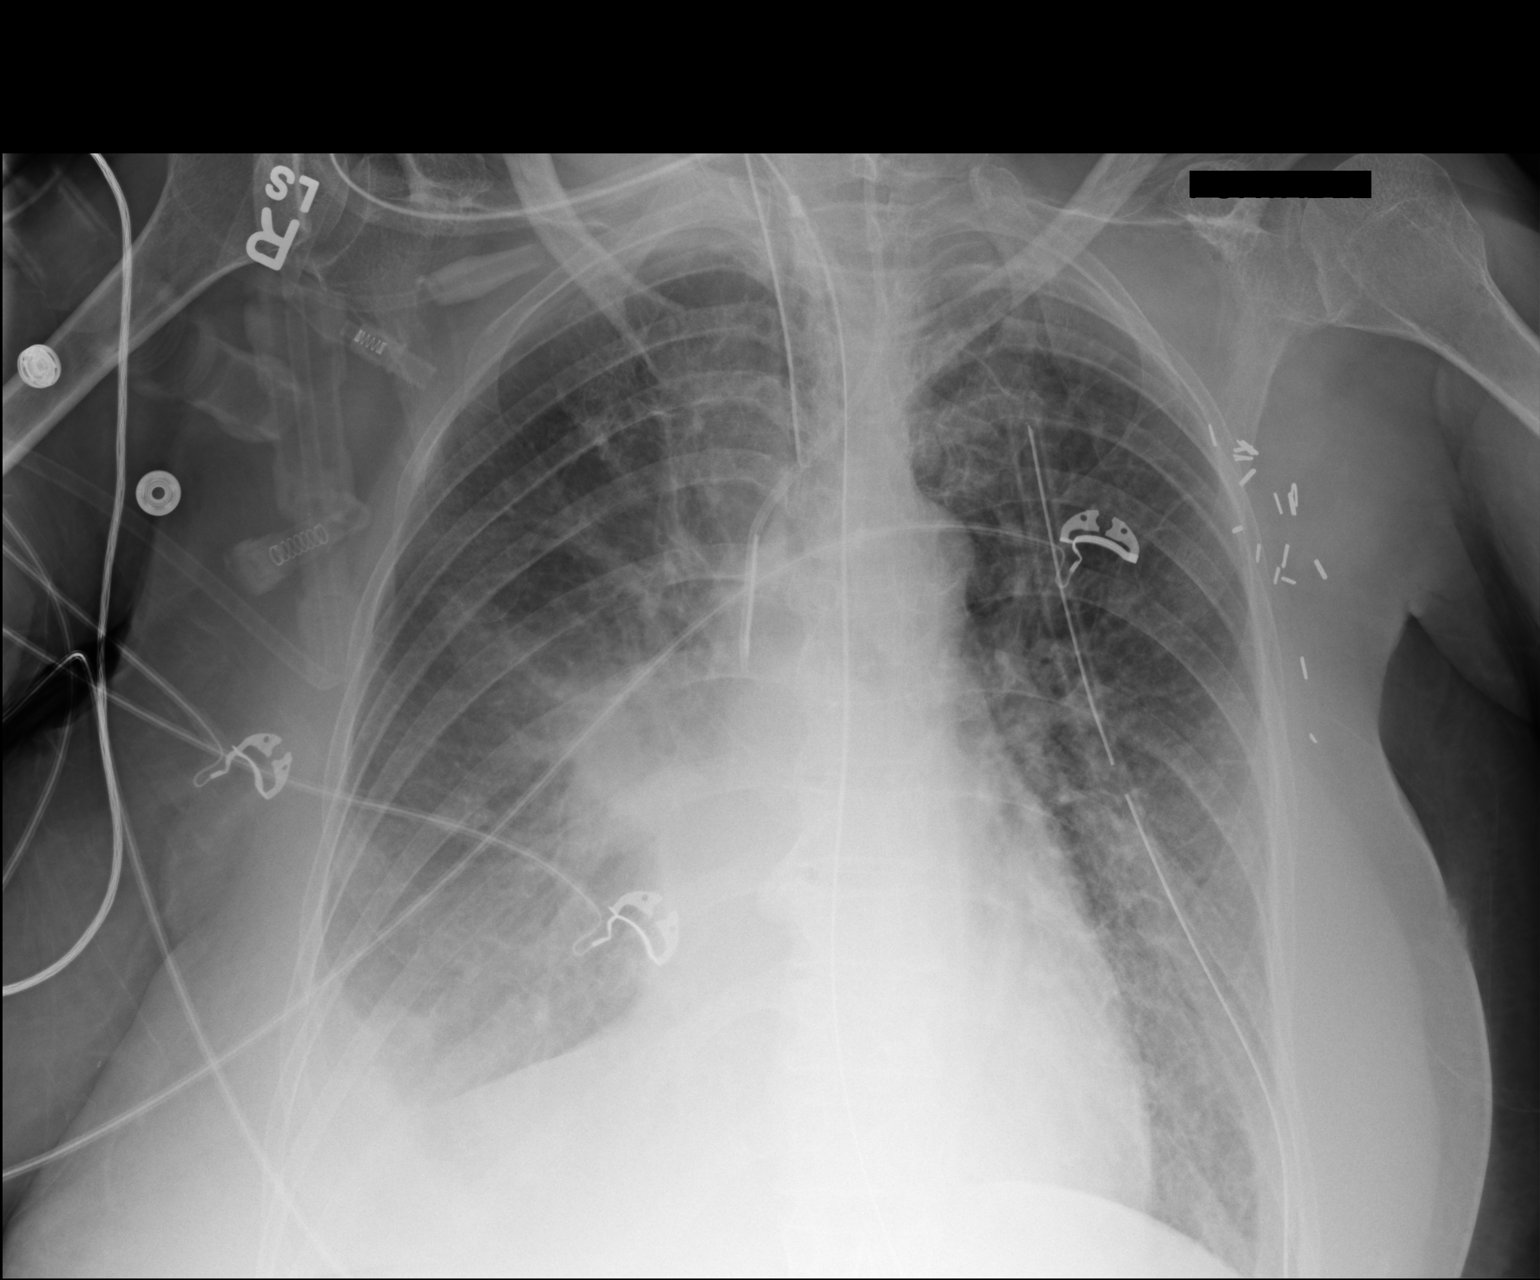

[1 of 1 positions shown; findings below may reference images not displayed]

FINDINGS: The patient's endotracheal tube is seen ending 4 cm above the
carina. The enteric tube is noted extending below the diaphragm. A
left IJ line is noted ending about the mid SVC. A left-sided chest
tube is noted.

A small right pleural effusion is noted. Vascular congestion is
noted, with bilateral central airspace opacities, likely reflecting
pulmonary edema. No pneumothorax is seen.

The cardiomediastinal silhouette is borderline normal in size. No
acute osseous abnormalities are identified. Scattered clips are seen
at the left axilla.
IMPRESSION: 1. Endotracheal tube seen ending 4 cm above the carina.
2. Small right pleural effusion noted. Vascular congestion, with
bilateral central airspace opacities, reflecting mildly worsened
pulmonary edema.

## 2016-08-28 IMAGING — CR DG CHEST 1V PORT
1 series · 1 of 1 positions shown · non-contrast
Comparison: Portable chest x-ray April 23, 2015

CLINICAL DATA: Acute respiratory failure, monoclonal gammopathy,
chronic CHF

EXAM:
PORTABLE CHEST 1 VIEW

[AP]
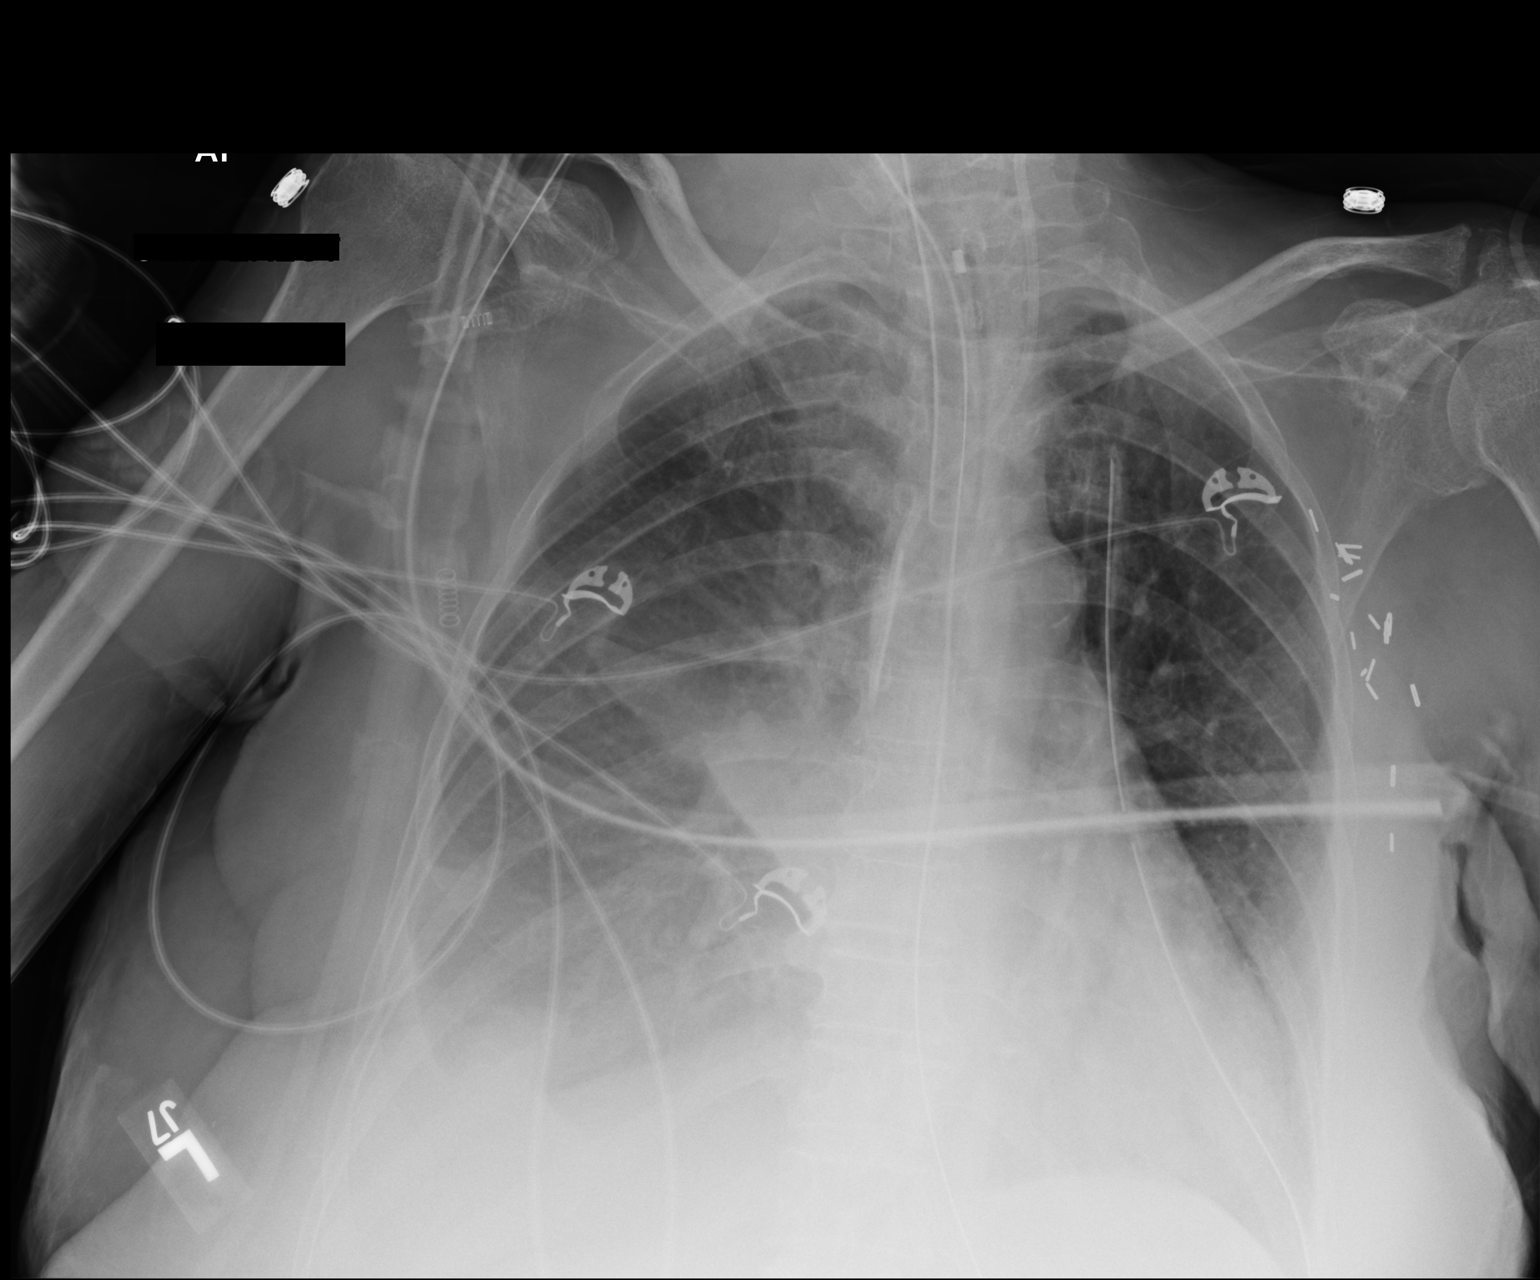

[1 of 1 positions shown; findings below may reference images not displayed]

FINDINGS: The lungs are well-expanded. There is a persistent small right
pleural effusion. Persistent right hilar atelectasis is present.
There is no pneumothorax. The left-sided chest tube is stable in
position. The heart is not enlarged. The pulmonary vascularity is
indistinct but less engorged today. The endotracheal tube tip lies 4
cm above the carina. The esophagogastric tube tip projects below the
inferior margin of the image. The left internal jugular venous
catheter tip projects over the midportion of the SVC.
IMPRESSION: Stable appearance of the chest since yesterday's study. There is a
small right pleural effusion persistent right perihilar atelectasis.
Mild pulmonary vascular congestion persists. The support tubes are
in reasonable position.

## 2016-08-29 IMAGING — CR DG CHEST 1V PORT
1 series · 1 of 1 positions shown · non-contrast
Comparison: Portable chest x-ray April 24, 2015

CLINICAL DATA: Acute respiratory failure, pneumonia, ventilated
patient, sepsis, history of CHF

EXAM:
PORTABLE CHEST 1 VIEW

[AP]
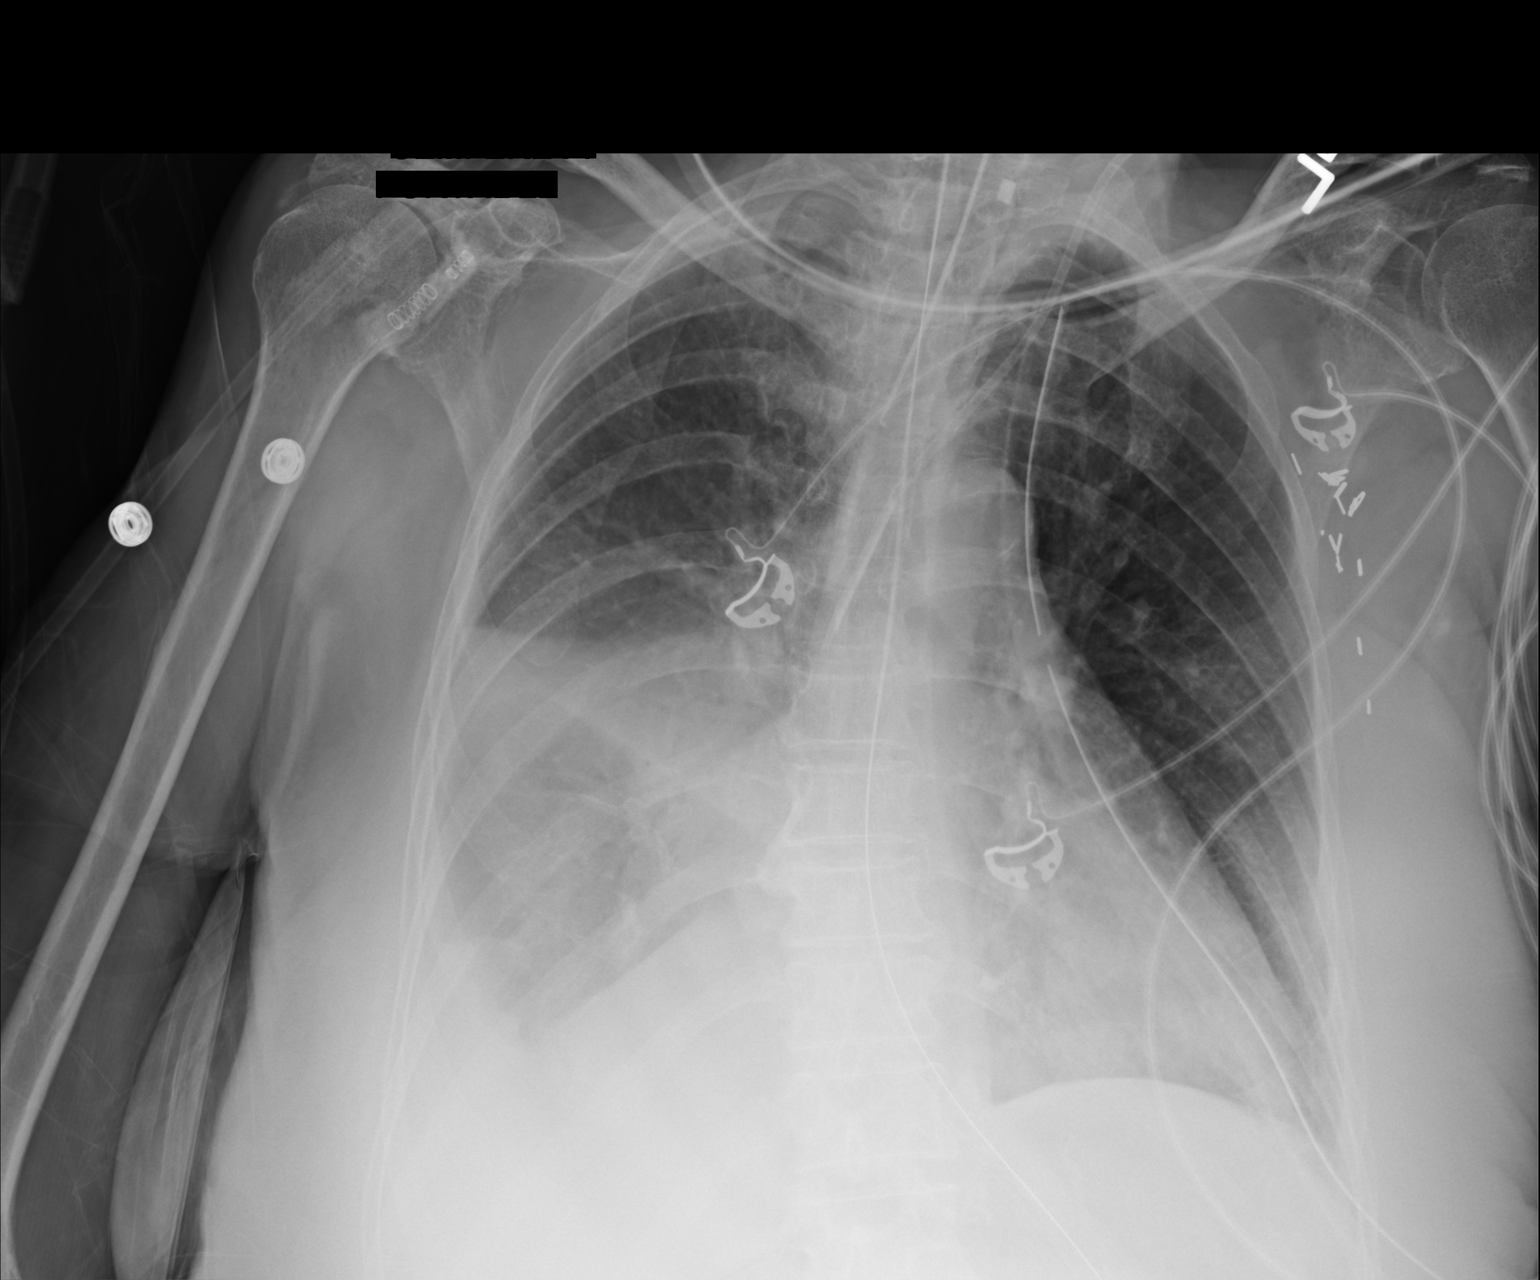

[1 of 1 positions shown; findings below may reference images not displayed]

FINDINGS: The left lung is well-expanded and clear. On the right further
opacity of the lower half of the hemi thorax has developed. Some
aerated lower lung persists. The heart is top-normal in size. The
pulmonary vascularity is only minimally prominent.

The left chest tube tip projects over the posterior aspect of the
left fourth rib. There is no pneumothorax or pleural effusion. On
the right there is a moderate-sized pleural effusion. The
endotracheal tube tip lies approximately 5 cm above the carina. The
esophagogastric tube tip projects below the inferior margin of the
image. The left internal jugular venous catheter tip projects over
the junction of the proximal and midportions of the SVC.
IMPRESSION: Slight interval worsening in the appearance of the right lower hemi
thorax consistent with atelectasis -pneumonia and pleural effusion.
The left lung is clear with no evidence of a pneumothorax or pleural
effusion. The support tubes are in reasonable position.
